# Patient Record
Sex: Male | Born: 1970
Health system: Southern US, Community
[De-identification: ages and names within clinical notes are randomized; demographics above are authoritative.]

## PROBLEM LIST (undated history)

## (undated) DIAGNOSIS — I69359 Hemiplegia and hemiparesis following cerebral infarction affecting unspecified side: Secondary | ICD-10-CM

## (undated) DIAGNOSIS — E538 Deficiency of other specified B group vitamins: Secondary | ICD-10-CM

## (undated) DIAGNOSIS — H532 Diplopia: Secondary | ICD-10-CM

## (undated) DIAGNOSIS — C719 Malignant neoplasm of brain, unspecified: Secondary | ICD-10-CM

## (undated) DIAGNOSIS — R2990 Unspecified symptoms and signs involving the nervous system: Secondary | ICD-10-CM

## (undated) DIAGNOSIS — R29898 Other symptoms and signs involving the musculoskeletal system: Secondary | ICD-10-CM

## (undated) DIAGNOSIS — E039 Hypothyroidism, unspecified: Secondary | ICD-10-CM

## (undated) DIAGNOSIS — R569 Unspecified convulsions: Secondary | ICD-10-CM

## (undated) DIAGNOSIS — H905 Unspecified sensorineural hearing loss: Secondary | ICD-10-CM

## (undated) DIAGNOSIS — F329 Major depressive disorder, single episode, unspecified: Secondary | ICD-10-CM

## (undated) DIAGNOSIS — R7989 Other specified abnormal findings of blood chemistry: Secondary | ICD-10-CM

## (undated) DIAGNOSIS — J309 Allergic rhinitis, unspecified: Secondary | ICD-10-CM

## (undated) DIAGNOSIS — R269 Unspecified abnormalities of gait and mobility: Secondary | ICD-10-CM

## (undated) DIAGNOSIS — R296 Repeated falls: Secondary | ICD-10-CM

## (undated) DIAGNOSIS — I69398 Other sequelae of cerebral infarction: Secondary | ICD-10-CM

## (undated) DIAGNOSIS — F419 Anxiety disorder, unspecified: Secondary | ICD-10-CM

## (undated) DIAGNOSIS — G4700 Insomnia, unspecified: Secondary | ICD-10-CM

## (undated) DIAGNOSIS — F32A Depression, unspecified: Secondary | ICD-10-CM

## (undated) HISTORY — DX: Repeated falls: R29.6

## (undated) HISTORY — PX: BRAIN SURGERY: SHX531

## (undated) HISTORY — DX: Other specified abnormal findings of blood chemistry: R79.89

## (undated) HISTORY — PX: HERNIA REPAIR: SHX51

## (undated) HISTORY — DX: Deficiency of other specified B group vitamins: E53.8

## (undated) HISTORY — PX: SHUNT REVISION: SHX343

## (undated) HISTORY — DX: Unspecified sensorineural hearing loss: H90.5

## (undated) HISTORY — DX: Hypothyroidism, unspecified: E03.9

## (undated) HISTORY — DX: Anxiety disorder, unspecified: F41.9

## (undated) HISTORY — DX: Unspecified symptoms and signs involving the nervous system: R29.90

## (undated) HISTORY — DX: Allergic rhinitis, unspecified: J30.9

## (undated) HISTORY — DX: Unspecified abnormalities of gait and mobility: R26.9

## (undated) HISTORY — DX: Diplopia: H53.2

## (undated) HISTORY — DX: Hemiplegia and hemiparesis following cerebral infarction affecting unspecified side: I69.359

## (undated) HISTORY — DX: Depression, unspecified: F32.A

## (undated) HISTORY — DX: Insomnia, unspecified: G47.00

## (undated) HISTORY — DX: Other sequelae of cerebral infarction: I69.398

## (undated) HISTORY — DX: Malignant neoplasm of brain, unspecified: C71.9

## (undated) HISTORY — DX: Major depressive disorder, single episode, unspecified: F32.9

## (undated) HISTORY — DX: Other symptoms and signs involving the musculoskeletal system: R29.898

## (undated) HISTORY — DX: Unspecified convulsions: R56.9

---

## 2009-02-16 ENCOUNTER — Emergency Department (HOSPITAL_COMMUNITY): Admission: EM | Admit: 2009-02-16 | Discharge: 2009-02-16 | Payer: Self-pay | Admitting: Emergency Medicine

## 2011-01-12 LAB — CBC
HCT: 45.1 % (ref 39.0–52.0)
MCV: 91.2 fL (ref 78.0–100.0)
RBC: 4.95 MIL/uL (ref 4.22–5.81)
WBC: 6.5 10*3/uL (ref 4.0–10.5)

## 2011-01-12 LAB — COMPREHENSIVE METABOLIC PANEL
AST: 22 U/L (ref 0–37)
BUN: 7 mg/dL (ref 6–23)
CO2: 27 mEq/L (ref 19–32)
Chloride: 109 mEq/L (ref 96–112)
Creatinine, Ser: 0.97 mg/dL (ref 0.4–1.5)
GFR calc non Af Amer: >60 mL/min (ref >60)
Glucose, Bld: 96 mg/dL (ref 70–99)
Total Bilirubin: 0.4 mg/dL (ref 0.3–1.2)

## 2011-01-12 LAB — POCT CARDIAC MARKERS: Troponin i, poc: <0.05 ng/mL (ref 0.00–0.09)

## 2011-01-12 LAB — DIFFERENTIAL
Basophils Absolute: 0 10*3/uL (ref 0.0–0.1)
Eosinophils Relative: 1 % (ref 0–5)
Lymphocytes Relative: 17 % (ref 12–46)
Neutro Abs: 4.8 10*3/uL (ref 1.7–7.7)

## 2011-09-12 ENCOUNTER — Emergency Department: Payer: Self-pay | Admitting: Internal Medicine

## 2011-10-11 ENCOUNTER — Ambulatory Visit: Payer: Self-pay | Admitting: Neurology

## 2012-11-13 ENCOUNTER — Inpatient Hospital Stay: Payer: Self-pay | Admitting: Surgery

## 2012-11-13 LAB — COMPREHENSIVE METABOLIC PANEL
Alkaline Phosphatase: 83 U/L (ref 50–136)
Chloride: 101 mmol/L (ref 98–107)
Creatinine: 1.12 mg/dL (ref 0.60–1.30)
EGFR (African American): >60
EGFR (Non-African Amer.): >60
Osmolality: 271 (ref 275–301)
Potassium: 3.3 mmol/L — ABNORMAL LOW (ref 3.5–5.1)
SGPT (ALT): 33 U/L (ref 12–78)
Sodium: 134 mmol/L — ABNORMAL LOW (ref 136–145)
Total Protein: 7.5 g/dL (ref 6.4–8.2)

## 2012-11-13 LAB — URINALYSIS, COMPLETE
Ketone: NEGATIVE
Leukocyte Esterase: NEGATIVE
Ph: 5 (ref 4.5–8.0)
Squamous Epithelial: <1

## 2012-11-13 LAB — CBC
HCT: 49.2 % (ref 40.0–52.0)
MCV: 89 fL (ref 80–100)
Platelet: 194 10*3/uL (ref 150–440)
RBC: 5.52 10*6/uL (ref 4.40–5.90)
WBC: 5.4 10*3/uL (ref 3.8–10.6)

## 2012-11-13 LAB — LIPASE, BLOOD: Lipase: 95 U/L (ref 73–393)

## 2012-11-14 LAB — BASIC METABOLIC PANEL
Anion Gap: 5 — ABNORMAL LOW (ref 7–16)
BUN: 13 mg/dL (ref 7–18)
Calcium, Total: 7.7 mg/dL — ABNORMAL LOW (ref 8.5–10.1)
Chloride: 104 mmol/L (ref 98–107)
Co2: 29 mmol/L (ref 21–32)
Creatinine: 1.06 mg/dL (ref 0.60–1.30)
EGFR (African American): >60
EGFR (Non-African Amer.): >60
Osmolality: 276 (ref 275–301)
Potassium: 3.7 mmol/L (ref 3.5–5.1)

## 2012-11-14 LAB — CBC WITH DIFFERENTIAL/PLATELET
Eosinophil %: 0.6 %
HGB: 14.6 g/dL (ref 13.0–18.0)
Lymphocyte %: 16 %
MCHC: 35.2 g/dL (ref 32.0–36.0)
MCV: 89 fL (ref 80–100)
Monocyte %: 18.4 %
Neutrophil #: 3 10*3/uL (ref 1.4–6.5)
Neutrophil %: 64.8 %
Platelet: 160 10*3/uL (ref 150–440)
RDW: 12.6 % (ref 11.5–14.5)
WBC: 4.7 10*3/uL (ref 3.8–10.6)

## 2012-11-16 LAB — BASIC METABOLIC PANEL
Calcium, Total: 8.1 mg/dL — ABNORMAL LOW (ref 8.5–10.1)
Chloride: 109 mmol/L — ABNORMAL HIGH (ref 98–107)
Co2: 28 mmol/L (ref 21–32)
Creatinine: 0.84 mg/dL (ref 0.60–1.30)
EGFR (Non-African Amer.): >60
Glucose: 87 mg/dL (ref 65–99)
Osmolality: 282 (ref 275–301)
Potassium: 3.2 mmol/L — ABNORMAL LOW (ref 3.5–5.1)
Sodium: 143 mmol/L (ref 136–145)

## 2012-11-16 LAB — CBC WITH DIFFERENTIAL/PLATELET
Basophil %: 0.3 %
Eosinophil #: 0.1 10*3/uL (ref 0.0–0.7)
Eosinophil %: 3 %
HCT: 36.3 % — ABNORMAL LOW (ref 40.0–52.0)
HGB: 12.4 g/dL — ABNORMAL LOW (ref 13.0–18.0)
Lymphocyte #: 1.4 10*3/uL (ref 1.0–3.6)
Lymphocyte %: 30.1 %
MCH: 30.6 pg (ref 26.0–34.0)
MCHC: 34 g/dL (ref 32.0–36.0)
Neutrophil #: 2.6 10*3/uL (ref 1.4–6.5)
Neutrophil %: 55.8 %
Platelet: 167 10*3/uL (ref 150–440)
RBC: 4.04 10*6/uL — ABNORMAL LOW (ref 4.40–5.90)

## 2013-11-05 DIAGNOSIS — M1990 Unspecified osteoarthritis, unspecified site: Secondary | ICD-10-CM | POA: Insufficient documentation

## 2013-11-05 DIAGNOSIS — R413 Other amnesia: Secondary | ICD-10-CM

## 2013-11-05 DIAGNOSIS — R519 Headache, unspecified: Secondary | ICD-10-CM | POA: Insufficient documentation

## 2013-11-05 DIAGNOSIS — G479 Sleep disorder, unspecified: Secondary | ICD-10-CM | POA: Insufficient documentation

## 2013-11-05 HISTORY — DX: Headache, unspecified: R51.9

## 2013-11-05 HISTORY — DX: Unspecified osteoarthritis, unspecified site: M19.90

## 2013-11-05 HISTORY — DX: Other amnesia: R41.3

## 2013-11-05 HISTORY — DX: Sleep disorder, unspecified: G47.9

## 2014-03-12 ENCOUNTER — Ambulatory Visit: Payer: Self-pay | Admitting: Specialist

## 2014-03-12 LAB — CBC WITH DIFFERENTIAL/PLATELET
BASOS ABS: 0 10*3/uL (ref 0.0–0.1)
BASOS PCT: 0.5 %
EOS ABS: 0.2 10*3/uL (ref 0.0–0.7)
Eosinophil %: 2.5 %
HCT: 45.2 % (ref 40.0–52.0)
HGB: 14.9 g/dL (ref 13.0–18.0)
LYMPHS ABS: 1.4 10*3/uL (ref 1.0–3.6)
Lymphocyte %: 19.4 %
MCH: 30.1 pg (ref 26.0–34.0)
MCHC: 33 g/dL (ref 32.0–36.0)
MCV: 91 fL (ref 80–100)
MONO ABS: 0.5 x10 3/mm (ref 0.2–1.0)
Monocyte %: 7.2 %
NEUTROS ABS: 5.2 10*3/uL (ref 1.4–6.5)
Neutrophil %: 70.4 %
PLATELETS: 202 10*3/uL (ref 150–440)
RBC: 4.96 10*6/uL (ref 4.40–5.90)
RDW: 12.9 % (ref 11.5–14.5)
WBC: 7.4 10*3/uL (ref 3.8–10.6)

## 2014-03-12 LAB — BASIC METABOLIC PANEL
ANION GAP: 3 — AB (ref 7–16)
BUN: 14 mg/dL (ref 7–18)
CALCIUM: 9.2 mg/dL (ref 8.5–10.1)
CHLORIDE: 107 mmol/L (ref 98–107)
CREATININE: 1.07 mg/dL (ref 0.60–1.30)
Co2: 29 mmol/L (ref 21–32)
GLUCOSE: 86 mg/dL (ref 65–99)
Osmolality: 277 (ref 275–301)
POTASSIUM: 4.2 mmol/L (ref 3.5–5.1)
Sodium: 139 mmol/L (ref 136–145)

## 2014-03-25 ENCOUNTER — Ambulatory Visit: Payer: Self-pay | Admitting: Specialist

## 2014-07-05 ENCOUNTER — Ambulatory Visit (HOSPITAL_COMMUNITY): Payer: Self-pay | Admitting: Psychiatry

## 2014-11-06 ENCOUNTER — Emergency Department: Payer: Self-pay | Admitting: Emergency Medicine

## 2014-11-06 LAB — COMPREHENSIVE METABOLIC PANEL WITH GFR
Albumin: 3.8 g/dL
Alkaline Phosphatase: 89 U/L
Anion Gap: 7
BUN: 14 mg/dL
Bilirubin,Total: 0.4 mg/dL
Calcium, Total: 8.8 mg/dL
Chloride: 106 mmol/L
Co2: 29 mmol/L
Creatinine: 1.11 mg/dL
EGFR (African American): >60
EGFR (Non-African Amer.): >60
Glucose: 91 mg/dL
Osmolality: 283
Potassium: 3.7 mmol/L
SGOT(AST): 33 U/L
SGPT (ALT): 55 U/L
Sodium: 142 mmol/L
Total Protein: 6.9 g/dL

## 2014-11-06 LAB — CBC
HCT: 44.9 %
HGB: 15.2 g/dL
MCH: 31 pg
MCHC: 33.7 g/dL
MCV: 92 fL
Platelet: 202 x10 3/mm 3
RBC: 4.89 x10 6/mm 3
RDW: 12.8 %
WBC: 7.3 x10 3/mm 3

## 2014-11-06 LAB — SALICYLATE LEVEL: Salicylates, Serum: <1.7 mg/dL

## 2014-11-06 LAB — TSH: THYROID STIMULATING HORM: 3.63 u[IU]/mL

## 2014-11-06 LAB — ACETAMINOPHEN LEVEL: Acetaminophen: <2 ug/mL

## 2014-11-06 LAB — ETHANOL: Ethanol: <3 mg/dL

## 2015-01-24 NOTE — H&P (Signed)
PATIENT NAME:  Love, [name] MR#:  [id number] DATE OF BIRTH:  01-01-1971  DATE OF ADMISSION:  11/13/2012  PRIMARY CARE PHYSICIAN: [facility].   ADMITTING PHYSICIAN: Dr. Pat Patrick.   CHIEF COMPLAINT: Nausea, vomiting, diarrhea and abdominal pain.   BRIEF HISTORY: The patient is a 44 year old gentleman seen in the Emergency Room with a 2-day history of abdominal pain. He noted a gradual onset of some abdominal distention followed by nausea, vomiting and diarrhea. He noted that he was burping regularly, passing gas, but had increasing abdominal distention. Abdominal pain increased and he presented to the Emergency Room for further evaluation. Workup in the Emergency Room revealed some mild changes in his electrolytes but no elevation in his white blood cell count. Plain films demonstrate an ileus pattern. Noncontrasted CT scan was performed which demonstrated a similar pattern. Surgical service was consulted.    He has history of an unknown brain tumor treated in [place] at the [facility] by the neurosurgeons. No primary surgery was performed. The patient had radiation and chemotherapy by his report. A shunt was placed originally from his ventricles to his abdomen, but he had some difficulties with the perineal portion of the shunt and it was converted to a chest shunt. He has not had any problems since that time. He has not had any other previous abdominal surgery. He denies a history of hepatitis, yellow jaundice, pancreatitis, peptic ulcer disease, gallbladder disease or diverticulitis. He has had a colonoscopy 3 years ago. He was last seen by his primary care physician in [facility] 4 months ago for routine followup.  He has new onset of a seizure disorder which is being managed by [facility] neurology group. Has not seen the oncologist or the surgeons in over a year.   CURRENT MEDICATIONS:  Include Keppra 1000 mg twice a day and Neurontin 100 mg 4 times a day.   ALLERGIES: HE  IS ALLERGIC TO SULFA DRUGS AND IVP DYE.   SOCIAL HISTORY:  He does not have a history of tobacco abuse or alcohol abuse. He is currently living with a friend in [place]. He is unemployed currently.   REVIEW OF SYSTEMS: Carried out and is otherwise unremarkable.   PHYSICAL EXAMINATION: GENERAL: He is alert, appears to be engaged and involved with some hearing loss.  VITALS: Blood pressure 104/67, pain scale was rated at 10, heart rate 90 and regular, respirations 20, oxygen saturation stable.  HEENT: Reveals no significant abnormalities. He has no pupillary abnormalities, no facial deformity and no scleral icterus.  NECK: Supple without adenopathy or tenderness. Trachea is midline.  CHEST: Clear with no adventitious sounds.  CARDIAC: No murmurs or gallops to my ear. He seems to be in normal sinus rhythm.  ABDOMEN: His abdomen is mildly distended, generally tympanitic with some diffuse tenderness. He does not have any point tenderness, rebound, guarding, masses or hernias noted. He does have active bowel sounds which did not sound obstructed on examination.  EXTREMITIES: Reveals full range of motion. No deformities. Good distal pulses.  PSYCHIATRIC: Normal orientation, normal affect.   IMPRESSION AND RECOMMENDATIONS: I have independently reviewed the CT scan. He does not appear to be obstructed. We will admit him to the hospital, hydrate him, look for an infectious etiology to his diarrhea, check Clostridium difficile titer and get gastroenterology  involved if necessary. We will ask internal medicine service to help [place] manage his seizure disorder. This plan has been discussed with the patient and his family, and they are  in agreement.    ____________________________ Micheline Maze, MD rle:cs D: 11/13/2012 14:26:59 ET T: 11/13/2012 15:05:58 ET JOB#: [id number]  cc: Rodena Goldmann III, MD, <Dictator> Rodena Goldmann MD ELECTRONICALLY SIGNED 11/15/2012 18:05

## 2015-01-24 NOTE — Discharge Summary (Signed)
PATIENT NAME:  [name] Love, [name] Love MR#:  [id number] DATE OF BIRTH:  [birth year]  DATE OF ADMISSION:  11/13/2012 DATE OF DISCHARGE:  11/16/2012  BRIEF HISTORY: Kye Hedden is a 44 year old gentleman admitted through the Emergency Room with abdominal distention, nausea, vomiting and diarrhea. CT scan demonstrated some distended colon and the question of possible bowel obstruction was raised. Review of the films suggested probable ileus. The patient has had problems with diarrhea in the past. He has multiple other medical comorbidities. He was admitted to the hospital, placed on IV antibiotics, IV rehydration and liquid diet. His symptoms improved over the next 48 hours. He had been on antibiotics for upper respiratory infection and so we ruled out the possibility of Clostridium difficile infection. The titers were negative. His exam is improved, he is tolerating a regular diet and his diarrhea is much improved. We will discharge him home today to be followed in 7 to 10 days' time. He will likely need a gastroenterologist for further followup and possible colonoscopy.   DISCHARGE MEDICATIONS: Include Neurontin 100 mg q.i.d., Keppra 1000 mg p.o. b.i.d., metronidazole 500 mg p.o. q. 8 hours, Percocet 5/325 mg every 4 to 6 hours p.r.n. and ciprofloxacin 500 mg p.o. q. 12 hours.   FINAL DISCHARGE DIAGNOSES: Abdominal pain, diarrhea. ____________________________ Micheline Maze, MD rle:sb D: 11/16/2012 10:40:31 ET T: 11/16/2012 10:54:29 ET JOB#: [id number]  cc: Rodena Goldmann III, MD, <Dictator> Rodena Goldmann MD ELECTRONICALLY SIGNED 11/24/2012 17:17

## 2015-01-24 NOTE — Consult Note (Signed)
PATIENT NAME:  [name] Love, [name] Love MR#:  [id number] DATE OF BIRTH:  March 13, 1971  DATE OF CONSULTATION:  11/13/2012  REFERRING PHYSICIAN:  Dr. Pat Patrick.  PRIMARY CARE PHYSICIAN:  In [place].  CONSULTING PHYSICIAN:  Epifanio Lesches, MD  REASON FOR CONSULT:  For seizures and epilepsy management.  HISTORY OF PRESENT ILLNESS: A 44 year old male admitted to surgical service this morning for abdominal pain, diarrhea and ileus. Medical consult requested for management of seizures. The patient says that he has had seizure disorder for a long time. Has history of brain cancer with programable VP shunt.  Sees a neurologist in [place].  Last seizure was about 2 months ago, and his Keppra dose has been increased. Right now, he is on Keppra 1 gram p.o. b.i.d., increased the dose 2 months ago, and the patient has been tolerating this dose pretty well. Denies any side effects of that, and he is also on Neurontin for neuropathic pain. The patient has some balance issues because of his multiple brain surgeries, but otherwise denies any headaches or seizures recently. The patient has been having some abdominal pain, diarrhea since the weekend associated with vomiting, and says that foul-smelling diarrhea that is ongoing he is unable to control.   PAST MEDICAL HISTORY: Seizure disorder, history of brain cancer status post programable VP shunt.  Also has neuropathic pain.  ALLERGIES: ALLERGIC TO IV DYE, INFLUENZA VACCINE H1N1 AND SULFA DRUGS.   SOCIAL HISTORY: No smoking. No drinking. No drugs. The patient lives with mother.   MEDICATIONS:  Right now, he is on IV fluids D5 half with potassium at 150 an hour, Cipro 400 q.12, Lovenox 40 subQ daily.  He is on Percocet 5/325 q.4 hours p.r.n., Neurontin 100 mg p.o. q.i.d., Dilaudid 1 mg q.2 hours p.r.n., Keppra 1 gram q.12 hours. He is on metronidazole 500 mg q.8 hours and Zofran.   HOME MEDICATIONS:  Include Keppra 1 gram q.12 hours and also Neurontin 100 mg q.i.d.    PAST  SURGICAL HISTORY: Had multiple brain surgeries and history of brain cancer status post radiation therapy.   REVIEW OF SYSTEMS: CONSTITUTIONAL:  Has no headache. No blurred vision.  GASTROINTESTINAL:  Has nausea, abdominal pain, diarrhea since Saturday.  NEUROLOGIC:  Denies any history of TIAs.  GENITOURINARY: The patient has no dysuria.  CARDIOVASCULAR:  No chest pain.  RESPIRATORY:  No trouble breathing.   PHYSICAL EXAMINATION: VITAL SIGNS: Temperature 98.2, heart rate 107, blood pressure 104/69, sats 94% on room air.  GENERAL: Alert, awake, oriented, a little hard of hearing but answering questions approximately.  HEAD AND EYES:  Atraumatic, normocephalic. Pupils equally reacting to light. Extraocular movements intact.  EAR, NOSE, THROAT: No tympanic membrane congestion. No turbinate hypertrophy. No oropharyngeal erythema.   NECK:   Normal range of motion. No JVD. No carotid bruit. CARDIOVASCULAR: S1 and S2 regular. No murmurs.  LUNGS: Clear to auscultation. No wheeze noted.  ABDOMEN: Mildly distended and tympanic, and has no point tenderness. No guarding, no hernias, and has active bowel sounds.  EXTREMITIES: No edema. Good range of motion. Good distal pulses.  NEUROLOGICAL: Oriented to time, place, person.  PSYCHIATRIC:  Mood and affect are within normal limits.   LABORATORY DATA:  UA is clear, yellow-colored urine. CAT scan of the abdomen shows multiple dilated loops of small bowel and colon consistent with adynamic ileus. WBC 5.4, hemoglobin 17.4, hematocrit 49.2, platelets 194. Electrolytes: Sodium 134, potassium 3.3, chloride 101, bicarb 24, creatinine 1.12, glucose 124.  ASESSMENT AND PLAN:   1.  This patient is a 44 year old male patient with history of seizure disorder with with h/o brain surgery and VP shunt,) on Keppra 1 gram p.o. b.i.d. at home, and right now he is on Keppra 1 gram p.o. b.i.d., and continue the same dose.,can change it to IV in case he has nasuea,diarrhea which  are  persistent, The medicine dose has been increased by the primary doctor, his neurologist, 2 months ago, so we would recommend to continue the Keppra 1 gram p.o. b.i.d. For his neuropathy pain, the patient is already on Neurontin 100 mg p.o. q.i.d. Continue that.  2.  Abdominal pain with colitis. He is already on Cipro, Flagyl, along with fluids. Continue that. Stool for C. diff has been ordered. He is on contact isolation. Continue to monitor as per the surgery, and we will follow up with this patient.   TIME SPENT: About 55 minutes.   Thank you for asking [place] to see this patient.    ____________________________ Epifanio Lesches, MD sk:dm D: 11/13/2012 20:04:22 ET T: 11/13/2012 22:10:10 ET JOB#: [id number]  cc: Epifanio Lesches, MD, <Dictator> Epifanio Lesches MD ELECTRONICALLY SIGNED 12/05/2012 13:41

## 2015-01-25 NOTE — Op Note (Signed)
PATIENT NAME:  [name] Love, [name] Love MR#:  [id number] DATE OF BIRTH:  [birth year]  DATE OF PROCEDURE:  03/25/2014  PREOPERATIVE DIAGNOSIS: Partial underside tear of the left medial meniscus.   POSTOPERATIVE DIAGNOSIS: Partial underside tear of the left medial meniscus.   OPERATION: Arthroscopic left medial meniscus repair.   SURGEON: Dr. Earnestine Leys  ANESTHESIA: General LMA.   COMPLICATIONS: None.   DRAINS: None.   ESTIMATED BLOOD LOSS: Minimal. Replaced:  None.   OPERATIVE FINDINGS: The patient had a partial underside tear of the posterior horn of the medial meniscus. Articular surfaces were intact. There was significant anterior synovitis. The intercondylar notch was normal, with normal anterior cruciate and posterior cruciate. The lateral compartment was normal. There were no loose bodies. The patellofemoral joint was smooth. There was no significant suprapatellar synovitis.   OPERATIVE PROCEDURE: The patient was brought to the Operating Room, where he underwent satisfactory general LMA anesthesia in the supine position. The left leg was prepped and draped in sterile fashion, and arthroscopy carried out through standard portals. The above findings were encountered. Some anterior synovectomy was carried out for visualization. Probe was inserted, and the meniscus initially looked good posteriorly and medially. However, with probing, the undersurface showed a partial tear near the meniscocapsular junction. The remainder of the joint was normal. At that point, I introduced a Fast-Fix meniscal repair and placed a meniscal stitch in the posterior horn of the meniscus to stabilize this. This was done after the motorized resector was used to roughen up the tissue and get some bleeding. This stabilized the meniscus well. The wound was then irrigated and closed with 3-0 nylon sutures. Dry sterile dressings were applied. The tourniquet was not used. The patient was awakened and taken to recovery in good  condition.    ____________________________ Park Breed, MD hem:mr D: 03/25/2014 14:35:56 ET T: 03/25/2014 19:13:15 ET JOB#: [id number]  cc: Park Breed, MD, <Dictator> Park Breed MD ELECTRONICALLY SIGNED 03/25/2014 20:17

## 2015-04-22 ENCOUNTER — Emergency Department: Payer: Medicare Other

## 2015-04-22 ENCOUNTER — Emergency Department
Admission: EM | Admit: 2015-04-22 | Discharge: 2015-04-22 | Disposition: A | Discharge status: Home / Self Care | Payer: Medicare Other | Attending: Emergency Medicine | Admitting: Emergency Medicine

## 2015-04-22 ENCOUNTER — Encounter: Payer: Self-pay | Admitting: Emergency Medicine

## 2015-04-22 DIAGNOSIS — Y9289 Other specified places as the place of occurrence of the external cause: Secondary | ICD-10-CM | POA: Insufficient documentation

## 2015-04-22 DIAGNOSIS — R51 Headache: Secondary | ICD-10-CM

## 2015-04-22 DIAGNOSIS — G40909 Epilepsy, unspecified, not intractable, without status epilepticus: Secondary | ICD-10-CM | POA: Diagnosis not present

## 2015-04-22 DIAGNOSIS — Y998 Other external cause status: Secondary | ICD-10-CM | POA: Insufficient documentation

## 2015-04-22 DIAGNOSIS — Y9389 Activity, other specified: Secondary | ICD-10-CM | POA: Diagnosis not present

## 2015-04-22 DIAGNOSIS — W1839XA Other fall on same level, initial encounter: Secondary | ICD-10-CM | POA: Insufficient documentation

## 2015-04-22 DIAGNOSIS — R519 Headache, unspecified: Secondary | ICD-10-CM

## 2015-04-22 DIAGNOSIS — S0990XA Unspecified injury of head, initial encounter: Secondary | ICD-10-CM | POA: Diagnosis present

## 2015-04-22 DIAGNOSIS — R569 Unspecified convulsions: Secondary | ICD-10-CM

## 2015-04-22 HISTORY — DX: Malignant neoplasm of brain, unspecified: C71.9

## 2015-04-22 HISTORY — DX: Unspecified convulsions: R56.9

## 2015-04-22 LAB — CBC
HCT: 45.7 % (ref 40.0–52.0)
Hemoglobin: 15.5 g/dL (ref 13.0–18.0)
MCH: 31.8 pg (ref 26.0–34.0)
MCHC: 34 g/dL (ref 32.0–36.0)
MCV: 93.6 fL (ref 80.0–100.0)
PLATELETS: 237 10*3/uL (ref 150–440)
RBC: 4.88 MIL/uL (ref 4.40–5.90)
RDW: 12.2 % (ref 11.5–14.5)
WBC: 9.3 10*3/uL (ref 3.8–10.6)

## 2015-04-22 LAB — COMPREHENSIVE METABOLIC PANEL
ALBUMIN: 4.3 g/dL (ref 3.5–5.0)
ALT: 23 U/L (ref 17–63)
AST: 22 U/L (ref 15–41)
Alkaline Phosphatase: 86 U/L (ref 38–126)
Anion gap: 5 (ref 5–15)
BILIRUBIN TOTAL: 0.5 mg/dL (ref 0.3–1.2)
BUN: 8 mg/dL (ref 6–20)
CO2: 24 mmol/L (ref 22–32)
CREATININE: 1.24 mg/dL (ref 0.61–1.24)
Calcium: 8.9 mg/dL (ref 8.9–10.3)
Chloride: 111 mmol/L (ref 101–111)
GFR calc Af Amer: >60 mL/min (ref >60)
Glucose, Bld: 99 mg/dL (ref 65–99)
Potassium: 3.9 mmol/L (ref 3.5–5.1)
SODIUM: 140 mmol/L (ref 135–145)
Total Protein: 6.9 g/dL (ref 6.5–8.1)

## 2015-04-22 NOTE — ED Notes (Signed)
Pt reports having a seizure on Saturday at [facility], unsure if hit head. Pt has brain cancer and a brain shunt, called neurosurgeon today due to continued headache, recommended he came in for CT scan.

## 2015-04-22 NOTE — Discharge Instructions (Signed)
Epilepsy °Epilepsy is a disorder in which a person has repeated seizures over time. A seizure is a release of abnormal electrical activity in the brain. Seizures can cause a change in attention, behavior, or the ability to remain awake and alert (altered mental status). Seizures often involve uncontrollable shaking (convulsions).  °Most people with epilepsy lead normal lives. However, people with epilepsy are at an increased risk of falls, accidents, and injuries. Therefore, it is important to begin treatment right away. °CAUSES  °Epilepsy has many possible causes. Anything that disturbs the normal pattern of brain cell activity can lead to seizures. This may include:  °· Head injury. °· Birth trauma. °· High fever as a child. °· Stroke. °· Bleeding into or around the brain. °· Certain drugs. °· Prolonged low oxygen, such as what occurs after CPR efforts. °· Abnormal brain development. °· Certain illnesses, such as meningitis, encephalitis (brain infection), malaria, and other infections. °· An imbalance of nerve signaling chemicals (neurotransmitters).   °SIGNS AND SYMPTOMS  °The symptoms of a seizure can vary greatly from one person to another. Right before a seizure, you may have a warning (aura) that a seizure is about to occur. An aura may include the following symptoms: °· Fear or anxiety. °· Nausea. °· Feeling like the room is spinning (vertigo). °· Vision changes, such as seeing flashing lights or spots. °Common symptoms during a seizure include: °· Abnormal sensations, such as an abnormal smell or a bitter taste in the mouth.   °· Sudden, general body stiffness.   °· Convulsions that involve rhythmic jerking of the face, arm, or leg on one or both sides.   °· Sudden change in consciousness.   °¨ Appearing to be awake but not responding.   °¨ Appearing to be asleep but cannot be awakened.   °· Grimacing, chewing, lip smacking, drooling, tongue biting, or loss of bowel or bladder control. °After a seizure,  you may feel sleepy for a while.  °DIAGNOSIS  °Your health care provider will ask about your symptoms and take a medical history. Descriptions from any witnesses to your seizures will be very helpful in the diagnosis. A physical exam, including a detailed neurological exam, is necessary. Various tests may be done, such as:  °· An electroencephalogram (EEG). This is a painless test of your brain waves. In this test, a diagram is created of your brain waves. These diagrams can be interpreted by a specialist. °· An MRI of the brain.   °· A CT scan of the brain.   °· A spinal tap (lumbar puncture, LP). °· Blood tests to check for signs of infection or abnormal blood chemistry. °TREATMENT  °There is no cure for epilepsy, but it is generally treatable. Once epilepsy is diagnosed, it is important to begin treatment as soon as possible. For most people with epilepsy, seizures can be controlled with medicines. The following may also be used: °· A pacemaker for the brain (vagus nerve stimulator) can be used for people with seizures that are not well controlled by medicine. °· Surgery on the brain. °For some people, epilepsy eventually goes away. °HOME CARE INSTRUCTIONS  °· Follow your health care provider's recommendations on driving and safety in normal activities. °· Get enough rest. Lack of sleep can cause seizures. °· Only take over-the-counter or prescription medicines as directed by your health care provider. Take any prescribed medicine exactly as directed. °· Avoid any known triggers of your seizures. °· Keep a seizure diary. Record what you recall about any seizure, especially any possible trigger.   °· Make   sure the people you live and work with know that you are prone to seizures. They should receive instructions on how to help you. In general, a witness to a seizure should:   Cushion your head and body.   Turn you on your side.   Avoid unnecessarily restraining you.   Not place anything inside your  mouth.   Call for emergency medical help if there is any question about what has occurred.   Follow up with your health care provider as directed. You may need regular blood tests to monitor the levels of your medicine.  SEEK MEDICAL CARE IF:   You develop signs of infection or other illness. This might increase the risk of a seizure.   You seem to be having more frequent seizures.   Your seizure pattern is changing.  SEEK IMMEDIATE MEDICAL CARE IF:   You have a seizure that does not stop after a few moments.   You have a seizure that causes any difficulty in breathing.   You have a seizure that results in a very severe headache.   You have a seizure that leaves you with the inability to speak or use a part of your body.  Document Released: 09/20/2005 Document Revised: 07/11/2013 Document Reviewed: 05/02/2013 [facility] Patient Information 2015 [place], [place]. This information is not intended to replace advice given to you by your health care provider. Make sure you discuss any questions you have with your health care provider.     As we have discussed today's results are largely within normal limits including your lab workup, and CT. Head. Please follow-up with your neurologist and neurosurgeon regarding your recent seizure and fall. Return to the emergency department for any further seizure-like activity, head injuries, or any other symptom personally concerning to yourself.

## 2015-04-22 NOTE — ED Provider Notes (Signed)
[facility] Emergency Department Provider Note  Time seen: 5:47 PM  I have reviewed the triage vital signs and the nursing notes.   HISTORY  Chief Complaint Headache    HPI [name] Love is a 44 y.o. male with a past medical history of brain cancer, status post radiation, seizure disorder on topiramate, who presents the emergency department after a possible seizure and fall on Saturday. According to the patient he was at [employer], when he blacked out. He states he will occasionally have these "spells." His neurologist believes them to be seizures, although he states they have never captured any activity on EEG. Patient has a VP shunt. He states he felt well Sunday, had a slight headache yesterday, so he called his neurologist and neurosurgeon today. They recommended that the patient come to a local emergency department for a head CT to rule out any bleed and to make sure that the shunt was still in place. Patient denies any symptoms currently. Denies any headache. States he feels fairly well/normal.     Past Medical History  Diagnosis Date  . Seizures   . Brain cancer     There are no active problems to display for this patient.   Past Surgical History  Procedure Laterality Date  . Brain surgery    . Shunt revision    . Hernia repair      No current outpatient prescriptions on file.  Allergies Sulfa antibiotics and Contrast media  No family history on file.  Social History History  Substance Use Topics  . Smoking status: Never Smoker   . Smokeless tobacco: Not on file  . Alcohol Use: No    Review of Systems Constitutional: Negative for fever. Cardiovascular: Negative for chest pain. Respiratory: Negative for shortness of breath. Gastrointestinal: Negative for abdominal pain Neurological: Positive for headache. Now resolved. Negative for focal weakness or numbness. 10-point ROS otherwise  negative.  ____________________________________________   PHYSICAL EXAM:  VITAL SIGNS: ED Triage Vitals  Enc Vitals Group     BP 04/22/15 1702 115/93 mmHg     Pulse Rate 04/22/15 1702 101     Resp 04/22/15 1702 16     Temp 04/22/15 1702 98.2 F (36.8 C)     Temp Source 04/22/15 1702 Oral     SpO2 04/22/15 1702 96 %     Weight 04/22/15 1702 190 lb (86.183 kg)     Height 04/22/15 1702 5\' 6"  (1.676 m)     Head Cir --      Peak Flow --      Pain Score 04/22/15 1703 5     Pain Loc --      Pain Edu? --      Excl. in [place]? --     Constitutional: Alert and oriented. Well appearing and in no distress. Eyes: Normal exam, 3 mm PERRL bilaterally ENT   Head: Normocephalic and atraumatic Cardiovascular: Normal rate, regular rhythm. No murmurs Respiratory: Normal respiratory effort without tachypnea nor retractions. Breath sounds are clear  Gastrointestinal: Soft and nontender. No distention.   Musculoskeletal: Nontender with normal range of motion in all extremities. No lower extremity tenderness or edema. Neurologic:  Normal speech and language. No gross focal neurologic deficits are appreciated. Speech is normal. No pronator drift. Equal grip strength bilaterally. Skin:  Skin is warm, dry and intact.  Psychiatric: Mood and affect are normal. Speech and behavior are normal.   ____________________________________________     RADIOLOGY  CT of the head  shows no acute abnormality, stable changes since February 2016.  ____________________________________________  INITIAL IMPRESSION / ASSESSMENT AND PLAN / ED COURSE  Pertinent labs & imaging results that were available during my care of the patient were reviewed by me and considered in my medical decision making (see chart for details).  Labs are largely within normal limits. CT head is unchanged. Patient denies any symptoms currently. I discussed with the patient normal results, that he should follow-up with his neurologist and  neurosurgeon. Also discussed strict return precautions for any further seizure-like activity, headache, or any other symptoms he deems concerning. Patient agreeable plan.  ____________________________________________   FINAL CLINICAL IMPRESSION(S) / ED DIAGNOSES  Headache Seizure   Harvest Dark, MD 04/22/15 1750

## 2015-04-22 NOTE — ED Notes (Signed)
Spoke with Dr Archie Balboa about pt's condition and pt's hx; verbal orders for CT scan and lab work given.

## 2015-11-10 DIAGNOSIS — F482 Pseudobulbar affect: Secondary | ICD-10-CM | POA: Diagnosis not present

## 2015-11-10 DIAGNOSIS — F0634 Mood disorder due to known physiological condition with mixed features: Secondary | ICD-10-CM | POA: Diagnosis not present

## 2015-11-11 DIAGNOSIS — Z6829 Body mass index (BMI) 29.0-29.9, adult: Secondary | ICD-10-CM | POA: Diagnosis not present

## 2015-11-11 DIAGNOSIS — E669 Obesity, unspecified: Secondary | ICD-10-CM | POA: Diagnosis not present

## 2015-11-11 DIAGNOSIS — J069 Acute upper respiratory infection, unspecified: Secondary | ICD-10-CM | POA: Diagnosis not present

## 2016-02-18 DIAGNOSIS — F0634 Mood disorder due to known physiological condition with mixed features: Secondary | ICD-10-CM | POA: Diagnosis not present

## 2016-02-18 DIAGNOSIS — F482 Pseudobulbar affect: Secondary | ICD-10-CM | POA: Diagnosis not present

## 2016-04-02 DIAGNOSIS — R569 Unspecified convulsions: Secondary | ICD-10-CM | POA: Diagnosis not present

## 2016-04-02 DIAGNOSIS — T50905S Adverse effect of unspecified drugs, medicaments and biological substances, sequela: Secondary | ICD-10-CM | POA: Diagnosis not present

## 2016-06-16 DIAGNOSIS — F0634 Mood disorder due to known physiological condition with mixed features: Secondary | ICD-10-CM | POA: Diagnosis not present

## 2016-06-16 DIAGNOSIS — F064 Anxiety disorder due to known physiological condition: Secondary | ICD-10-CM | POA: Diagnosis not present

## 2016-06-16 DIAGNOSIS — F482 Pseudobulbar affect: Secondary | ICD-10-CM | POA: Diagnosis not present

## 2016-06-17 DIAGNOSIS — F0634 Mood disorder due to known physiological condition with mixed features: Secondary | ICD-10-CM | POA: Diagnosis not present

## 2016-07-15 DIAGNOSIS — H903 Sensorineural hearing loss, bilateral: Secondary | ICD-10-CM | POA: Diagnosis not present

## 2016-07-15 DIAGNOSIS — C717 Malignant neoplasm of brain stem: Secondary | ICD-10-CM | POA: Diagnosis not present

## 2016-07-16 ENCOUNTER — Other Ambulatory Visit: Payer: Self-pay | Admitting: Otolaryngology

## 2016-07-16 DIAGNOSIS — C717 Malignant neoplasm of brain stem: Secondary | ICD-10-CM

## 2016-07-16 DIAGNOSIS — H903 Sensorineural hearing loss, bilateral: Secondary | ICD-10-CM

## 2016-07-20 DIAGNOSIS — Z1389 Encounter for screening for other disorder: Secondary | ICD-10-CM | POA: Diagnosis not present

## 2016-07-20 DIAGNOSIS — Z887 Allergy status to serum and vaccine status: Secondary | ICD-10-CM | POA: Diagnosis not present

## 2016-07-20 DIAGNOSIS — H905 Unspecified sensorineural hearing loss: Secondary | ICD-10-CM | POA: Diagnosis not present

## 2016-07-20 DIAGNOSIS — Z23 Encounter for immunization: Secondary | ICD-10-CM | POA: Diagnosis not present

## 2016-07-20 DIAGNOSIS — Z6831 Body mass index (BMI) 31.0-31.9, adult: Secondary | ICD-10-CM | POA: Diagnosis not present

## 2016-07-20 DIAGNOSIS — E669 Obesity, unspecified: Secondary | ICD-10-CM | POA: Diagnosis not present

## 2016-07-20 DIAGNOSIS — Z01818 Encounter for other preprocedural examination: Secondary | ICD-10-CM | POA: Diagnosis not present

## 2016-07-23 ENCOUNTER — Ambulatory Visit
Admission: RE | Admit: 2016-07-23 | Discharge: 2016-07-23 | Disposition: A | Discharge status: Home / Self Care | Payer: Medicare Other | Source: Ambulatory Visit | Attending: Otolaryngology | Admitting: Otolaryngology

## 2016-07-23 ENCOUNTER — Other Ambulatory Visit: Payer: Self-pay | Admitting: Otolaryngology

## 2016-07-23 DIAGNOSIS — R918 Other nonspecific abnormal finding of lung field: Secondary | ICD-10-CM | POA: Diagnosis not present

## 2016-07-23 DIAGNOSIS — H903 Sensorineural hearing loss, bilateral: Secondary | ICD-10-CM | POA: Diagnosis not present

## 2016-07-23 DIAGNOSIS — Z01811 Encounter for preprocedural respiratory examination: Secondary | ICD-10-CM

## 2016-07-23 DIAGNOSIS — C717 Malignant neoplasm of brain stem: Secondary | ICD-10-CM

## 2016-07-29 DIAGNOSIS — Z0000 Encounter for general adult medical examination without abnormal findings: Secondary | ICD-10-CM | POA: Diagnosis not present

## 2016-07-29 DIAGNOSIS — Z6831 Body mass index (BMI) 31.0-31.9, adult: Secondary | ICD-10-CM | POA: Diagnosis not present

## 2016-07-29 DIAGNOSIS — E039 Hypothyroidism, unspecified: Secondary | ICD-10-CM | POA: Diagnosis not present

## 2016-08-09 DIAGNOSIS — H903 Sensorineural hearing loss, bilateral: Secondary | ICD-10-CM | POA: Diagnosis not present

## 2016-08-09 DIAGNOSIS — C717 Malignant neoplasm of brain stem: Secondary | ICD-10-CM | POA: Diagnosis not present

## 2016-08-18 ENCOUNTER — Ambulatory Visit (HOSPITAL_BASED_OUTPATIENT_CLINIC_OR_DEPARTMENT_OTHER): Admit: 2016-08-18 | Payer: Medicare Other | Admitting: Otolaryngology

## 2016-08-18 ENCOUNTER — Encounter (HOSPITAL_BASED_OUTPATIENT_CLINIC_OR_DEPARTMENT_OTHER): Payer: Self-pay

## 2016-08-18 SURGERY — INSERTION, IMPLANT, COCHLEAR
Anesthesia: General | Laterality: Left

## 2016-09-06 DIAGNOSIS — F0634 Mood disorder due to known physiological condition with mixed features: Secondary | ICD-10-CM | POA: Diagnosis not present

## 2016-09-06 DIAGNOSIS — F482 Pseudobulbar affect: Secondary | ICD-10-CM | POA: Diagnosis not present

## 2016-09-06 DIAGNOSIS — F064 Anxiety disorder due to known physiological condition: Secondary | ICD-10-CM | POA: Diagnosis not present

## 2016-10-05 DIAGNOSIS — R1901 Right upper quadrant abdominal swelling, mass and lump: Secondary | ICD-10-CM | POA: Diagnosis not present

## 2016-10-05 DIAGNOSIS — R10811 Right upper quadrant abdominal tenderness: Secondary | ICD-10-CM | POA: Diagnosis not present

## 2016-10-05 DIAGNOSIS — Z6832 Body mass index (BMI) 32.0-32.9, adult: Secondary | ICD-10-CM | POA: Diagnosis not present

## 2016-10-05 DIAGNOSIS — J309 Allergic rhinitis, unspecified: Secondary | ICD-10-CM | POA: Diagnosis not present

## 2016-10-11 DIAGNOSIS — K8020 Calculus of gallbladder without cholecystitis without obstruction: Secondary | ICD-10-CM | POA: Diagnosis not present

## 2016-10-11 DIAGNOSIS — R1901 Right upper quadrant abdominal swelling, mass and lump: Secondary | ICD-10-CM | POA: Diagnosis not present

## 2016-10-19 DIAGNOSIS — J9811 Atelectasis: Secondary | ICD-10-CM | POA: Diagnosis not present

## 2016-10-19 DIAGNOSIS — K8020 Calculus of gallbladder without cholecystitis without obstruction: Secondary | ICD-10-CM | POA: Diagnosis not present

## 2016-10-19 DIAGNOSIS — R1901 Right upper quadrant abdominal swelling, mass and lump: Secondary | ICD-10-CM | POA: Diagnosis not present

## 2016-10-19 DIAGNOSIS — K439 Ventral hernia without obstruction or gangrene: Secondary | ICD-10-CM | POA: Diagnosis not present

## 2016-11-08 DIAGNOSIS — M542 Cervicalgia: Secondary | ICD-10-CM | POA: Diagnosis not present

## 2016-11-08 DIAGNOSIS — S0990XA Unspecified injury of head, initial encounter: Secondary | ICD-10-CM | POA: Diagnosis not present

## 2016-11-08 DIAGNOSIS — Z23 Encounter for immunization: Secondary | ICD-10-CM | POA: Diagnosis not present

## 2016-11-08 DIAGNOSIS — S199XXA Unspecified injury of neck, initial encounter: Secondary | ICD-10-CM | POA: Diagnosis not present

## 2016-11-08 DIAGNOSIS — M549 Dorsalgia, unspecified: Secondary | ICD-10-CM | POA: Diagnosis not present

## 2016-11-08 DIAGNOSIS — Z79899 Other long term (current) drug therapy: Secondary | ICD-10-CM | POA: Diagnosis not present

## 2016-11-08 DIAGNOSIS — R55 Syncope and collapse: Secondary | ICD-10-CM | POA: Diagnosis not present

## 2016-11-08 DIAGNOSIS — S0101XA Laceration without foreign body of scalp, initial encounter: Secondary | ICD-10-CM | POA: Diagnosis not present

## 2016-11-08 DIAGNOSIS — M545 Low back pain: Secondary | ICD-10-CM | POA: Diagnosis not present

## 2016-11-08 DIAGNOSIS — R569 Unspecified convulsions: Secondary | ICD-10-CM | POA: Diagnosis not present

## 2016-12-06 DIAGNOSIS — F0634 Mood disorder due to known physiological condition with mixed features: Secondary | ICD-10-CM | POA: Diagnosis not present

## 2016-12-06 DIAGNOSIS — F064 Anxiety disorder due to known physiological condition: Secondary | ICD-10-CM | POA: Diagnosis not present

## 2016-12-06 DIAGNOSIS — F482 Pseudobulbar affect: Secondary | ICD-10-CM | POA: Diagnosis not present

## 2017-01-19 DIAGNOSIS — F064 Anxiety disorder due to known physiological condition: Secondary | ICD-10-CM | POA: Diagnosis not present

## 2017-01-19 DIAGNOSIS — F482 Pseudobulbar affect: Secondary | ICD-10-CM | POA: Diagnosis not present

## 2017-01-19 DIAGNOSIS — F0634 Mood disorder due to known physiological condition with mixed features: Secondary | ICD-10-CM | POA: Diagnosis not present

## 2017-01-20 ENCOUNTER — Encounter: Payer: Self-pay | Admitting: Neurology

## 2017-01-20 ENCOUNTER — Encounter (INDEPENDENT_AMBULATORY_CARE_PROVIDER_SITE_OTHER): Payer: Self-pay

## 2017-01-20 ENCOUNTER — Telehealth: Payer: Self-pay | Admitting: Neurology

## 2017-01-20 ENCOUNTER — Ambulatory Visit (INDEPENDENT_AMBULATORY_CARE_PROVIDER_SITE_OTHER): Payer: Medicare Other | Admitting: Neurology

## 2017-01-20 DIAGNOSIS — R569 Unspecified convulsions: Secondary | ICD-10-CM

## 2017-01-20 MED ORDER — TOPIRAMATE 50 MG PO TABS: ORAL_TABLET | ORAL | 5 refills | Status: DC

## 2017-01-20 NOTE — Telephone Encounter (Signed)
Pt's aunt-Betty Sarvis (existing pt of Dr Viona Gilmore) called, says she could not come to appt with the pt. She would like a call back to go over his appt today.

## 2017-01-20 NOTE — Telephone Encounter (Signed)
Please refer to prior telephone note from today. 

## 2017-01-20 NOTE — Progress Notes (Signed)
Reason for visit: Seizures  Referring physician: Dr. Jerrye Bushy Shatto is a 46 y.o. male  History of present illness:  Mr. 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 The patient indicates that he presented with severe headaches and nausea and vomiting. The patient has had recurrence of the tumor following resection, and he eventually required radiation therapy in the late 1990s. The patient has had a VP shunt placement. He has developed some problems with memory loss secondary to the tumor resection and radiation therapy. He has developed seizures, he has been on Keppra and Topamax recently, in the past he has also been on Depakote. The patient denies any significant headaches at this time. He does report some numbness in both feet and in the hands. He feels as if his legs are weak and he has some gait instability. He does have a cane that he uses at times. He has developed hearing loss, he has a hearing aid in the left ear. He lives currently with his aunt, [name]. 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 He claims that the seizures are associated with staring episodes, in the medical records he may also have episodes of leg numbness with loss of consciousness and generalized jerking. The patient claims that the last blackout event was about 2 months ago. He has had several such events over the last one year. The patient notes some problems with urinary urgency at times. He is followed by Dr. Romero Liner from neurosurgery in [place], [place]. He was followed by Dr. Metta Clines who has moved, and he seeks a new neurologist at this time.  Past Medical History:  Diagnosis Date  . Brain cancer ([place])   . Seizures ([place])     Past Surgical History:  Procedure Laterality Date  . BRAIN SURGERY    . HERNIA REPAIR    . SHUNT REVISION      Family History  Problem Relation Age of Onset  . Brain cancer Mother   . High  blood pressure Mother     Social history:  reports that he has never smoked. He has never used smokeless tobacco. He reports that he does not drink alcohol or use drugs.  Medications:  Prior to Admission medications   Medication Sig Start Date End Date Taking? Authorizing Provider  ALPRAZolam (XANAX) 0.25 MG tablet Take 0.25 mg by mouth daily. 1 tablet in the morning and 2 tablets at bedtime 01/13/17  Yes Historical Provider, MD  escitalopram (LEXAPRO) 10 MG tablet Take 10 mg by mouth daily. 12/31/16  Yes Historical Provider, MD  levETIRAcetam (KEPPRA) 1000 MG tablet Take 1.5 tablets by mouth 2 (two) times daily. 12/31/16  Yes Historical Provider, MD  levothyroxine (SYNTHROID, LEVOTHROID) 50 MCG tablet Take 1 tablet by mouth daily. 12/25/16  Yes Historical Provider, MD  NUEDEXTA 20-10 MG CAPS Take 1 capsule by mouth 2 (two) times daily. 12/31/16  Yes Historical Provider, MD  risperiDONE (RISPERDAL) 0.5 MG tablet Take 0.5 mg by mouth at bedtime. 01/19/17  Yes Historical Provider, MD  topiramate (TOPAMAX) 50 MG tablet Take 50 mg by mouth 2 (two) times daily. 12/14/16  Yes Historical Provider, MD      Allergies  Allergen Reactions  . Sulfa Antibiotics   . Contrast Media [Iodinated Diagnostic Agents] Rash    ROS:  Out of a complete 14 system review of symptoms, the patient complains only of the following symptoms, and  all other reviewed systems are negative.  Hearing loss, ringing in the ears Increased thirst Joint pain, achy muscles Allergies Memory loss, numbness, weakness, seizures, passing out Depression, anxiety, decreased energy  Blood pressure 124/81, pulse 85, height 5\' 6"  (1.676 m), weight 200 lb (90.7 kg).  Physical Exam  General: The patient is alert and cooperative at the time of the examination.  Eyes: Pupils are equal, round, and reactive to light. Discs are flat bilaterally.  Neck: The neck is supple, no carotid bruits are noted.  Respiratory: The respiratory  examination is clear.  Cardiovascular: The cardiovascular examination reveals a regular rate and rhythm, no obvious murmurs or rubs are noted.  Skin: Extremities are without significant edema.  Neurologic Exam  Mental status: The patient is alert and oriented x 3 at the time of the examination. The patient has apparent normal recent and remote memory, with an apparently normal attention span and concentration ability.  Cranial nerves: Facial symmetry is present. There is good sensation of the face to pinprick and soft touch bilaterally. The strength of the facial muscles and the muscles to head turning and shoulder shrug are normal bilaterally. Speech is well enunciated, no aphasia or dysarthria is noted. Extraocular movements are full. Visual fields are full. The tongue is midline, and the patient has symmetric elevation of the soft palate. No obvious hearing deficits are noted. The patient is hard of hearing.  Motor: The motor testing reveals 5 over 5 strength of all 4 extremities. Good symmetric motor tone is noted throughout.  Sensory: Sensory testing is intact to pinprick, soft touch, vibration sensation, and position sense on all 4 extremities, with exception of some decrease in vibration sensation on the right foot as compared to left. No evidence of extinction is noted.  Coordination: Cerebellar testing reveals good finger-nose-finger and heel-to-shin bilaterally.  Gait and station: Gait is normal. Tandem gait is slightly unsteady. Romberg is positive, the patient falls backwards. No drift is seen.  Reflexes: Deep tendon reflexes are symmetric and normal bilaterally. Toes are downgoing bilaterally.   Assessment/Plan:  1. History of ependymoma resection, VP shunt placement  2. History of seizures  The patient indicates that he is still having occasional episodes of blacking out. I have indicated that he should not be operating a motor vehicle. The patient will go up on the Topamax  taking 50 mg the morning and 100 mg in the evening. He will remain on Keppra taking 1500 mg twice daily. The patient was given a prescription for the Topamax. He will be set up for an EEG study, he will follow-up in office in about 6 months.  Jill Alexanders MD 01/20/2017 2:13 PM  [facility] [address] [place], [place] [address]  Phone [phone] Fax [phone]

## 2017-01-20 NOTE — Patient Instructions (Signed)
   We will go up on the Topamax 50 mg tablet to one in the morning and two in the evening.  Topamax (topiramate) is a seizure medication that has an FDA approval for seizures and for migraine headache. Potential side effects of this medication include weight loss, cognitive slowing, tingling in the fingers and toes, and carbonated drinks will taste bad. If any significant side effects are noted on this drug, please contact our office.

## 2017-01-27 ENCOUNTER — Ambulatory Visit (INDEPENDENT_AMBULATORY_CARE_PROVIDER_SITE_OTHER): Payer: Medicare Other | Admitting: Neurology

## 2017-01-27 ENCOUNTER — Telehealth: Payer: Self-pay | Admitting: Neurology

## 2017-01-27 DIAGNOSIS — R569 Unspecified convulsions: Secondary | ICD-10-CM | POA: Diagnosis not present

## 2017-01-27 NOTE — Procedures (Signed)
    History:  [name] Love is a 46 year old gentleman with a history of an ependymoma resection in 1995. The patient has had a recurrence of the tumor on several occasions, he has required a VP shunt placement. He has had radiation therapy to the brain. He has a history of seizures, he has been treated with Keppra and Topamax. He is being evaluated for this issue.  This is a routine EEG. No skull defects are noted. Medications include Xanax, Lexapro, Keppra, Synthroid, Neudexta, Risperdal, and Topamax.   EEG classification: Normal awake  Description of the recording: The background rhythms of this recording consists of a fairly well modulated medium amplitude alpha rhythm of 8 Hz that is reactive to eye opening and closure. As the record progresses, the patient appears to remain in the waking state throughout the recording. Photic stimulation was performed, resulting in a bilateral and symmetric photic driving response. Hyperventilation was also performed, resulting in a minimal buildup of the background rhythm activities without significant slowing seen. At no time during the recording does there appear to be evidence of spike or spike wave discharges or evidence of focal slowing. EKG monitor shows no evidence of cardiac rhythm abnormalities with a heart rate of 78.  Impression: This is a normal EEG recording in the waking state. No evidence of ictal or interictal discharges are seen.

## 2017-01-27 NOTE — Telephone Encounter (Signed)
I called the patient.  The EEG study was normal. 

## 2017-02-24 ENCOUNTER — Telehealth: Payer: Self-pay | Admitting: Neurology

## 2017-02-24 DIAGNOSIS — R269 Unspecified abnormalities of gait and mobility: Secondary | ICD-10-CM

## 2017-02-24 NOTE — Addendum Note (Signed)
Addended by: Kathrynn Ducking on: 02/24/2017 01:40 PM   Modules accepted: Orders

## 2017-02-24 NOTE — Telephone Encounter (Signed)
Pt POA(aunt ) is calling with concern about pt, she states he lays in the bed all day and all night.  She would like a call back to know if maybe he could be set up for some type of Physical therapy

## 2017-02-24 NOTE — Telephone Encounter (Signed)
I called the patient, left a message, I will call back later. 

## 2017-02-24 NOTE — Telephone Encounter (Signed)
Called and scheduled f/u for 6/4/187 at 130pm, check in 100pm. Spoke with Hassan Rowan. She verbalized understanding and appreciation for call.

## 2017-02-24 NOTE — Telephone Encounter (Signed)
I called and talked with his POA, [name]. The patient is lethargic, sleeping all day. This could potentially be a side effect from the [place], we may need to consider switching him off of this medication and potentially go to Lamictal.  The patient fell last week, not sure that this was a seizure, the ground was slippery and wet. The patient does have a balance issue, we will get physical therapy set up for him. This will be done at [facility].  I will get an earlier revisit to consider a medication switch.

## 2017-03-03 DIAGNOSIS — R2689 Other abnormalities of gait and mobility: Secondary | ICD-10-CM | POA: Diagnosis not present

## 2017-03-03 DIAGNOSIS — R262 Difficulty in walking, not elsewhere classified: Secondary | ICD-10-CM | POA: Diagnosis not present

## 2017-03-03 DIAGNOSIS — M6281 Muscle weakness (generalized): Secondary | ICD-10-CM | POA: Diagnosis not present

## 2017-03-03 DIAGNOSIS — M25519 Pain in unspecified shoulder: Secondary | ICD-10-CM | POA: Diagnosis not present

## 2017-03-07 ENCOUNTER — Encounter: Payer: Self-pay | Admitting: Neurology

## 2017-03-07 ENCOUNTER — Ambulatory Visit (INDEPENDENT_AMBULATORY_CARE_PROVIDER_SITE_OTHER): Payer: Medicare Other | Admitting: Neurology

## 2017-03-07 VITALS — BP 107/81 | HR 95 | Ht 66.0 in | Wt 189.5 lb

## 2017-03-07 DIAGNOSIS — R569 Unspecified convulsions: Secondary | ICD-10-CM

## 2017-03-07 DIAGNOSIS — Z5181 Encounter for therapeutic drug level monitoring: Secondary | ICD-10-CM | POA: Diagnosis not present

## 2017-03-07 MED ORDER — LAMOTRIGINE 25 MG PO TABS: ORAL_TABLET | ORAL | 2 refills | Status: DC

## 2017-03-07 NOTE — Patient Instructions (Addendum)
   With the Keppra 1000 mg tablet, cut back to one twice a day.  Start Lamictal 25 mg one twice a day for 2 weeks, then   Take 2 tablets twice a day for 2 weeks, then  Take 3 tablets twice a day for 2 weeks, then  Call our office for dose adjustments.  [phone].  Lamictal (lamotrigine) is a seizure medication that occasionally may be used for other purposes such as peripheral neuropathy pain or certain types of headache. This medication is relatively safe, but occasionally side effects can occur. A skin rash may occur when first starting the medication. As with any seizure medication, depression may worsen on the drug. Other potential side effects include dizziness, headache, drowsiness or insomnia, decreased concentration, or stomach upset. This medication may also be used as a mood stabilizer. If you believe that you are having side effects on the medication, please contact our office.

## 2017-03-07 NOTE — Telephone Encounter (Signed)
I called the POA. I discussed the medication changes delineated on the revisit today.  I am concerned the patient may have a progressive dementing illness associated with prior brain radiation.  Hopefully the medication changes may help, he will have a MRI of the brain done in the near future.

## 2017-03-07 NOTE — Telephone Encounter (Signed)
Pt POA calling because she was unable to attend appointment with pt today but she is available to be called back and discuss what took place in the pt's appointment with Dr Jannifer Franklin

## 2017-03-07 NOTE — Progress Notes (Signed)
Reason for visit: Seizures  [name] Love is an 46 y.o. male  History of present illness:  [name] Love is a 46 year old right-handed white male with a history of an ependymoma resection, status post VP shunt placement. The patient has a history of seizures. He is on Topamax and Keppra. A recent EEG study was unremarkable. The patient comes back today for evaluation of excessive drowsiness and fatigue. The patient indicates that he will go to bed around 8 or 9:00 in the evening and sleep until about 10 AM, some days he may actually spend time in bed until about 4 PM. The patient gets up out of bed, goes to a recliner and sometimes he will fall sleep in the recliner. He has a very low level of energy. He has a gait disturbance, he has not had any falls, uses a cane for ambulation. He feels that he has difficulty concentrating and he indicates that she is "in his own world". He reports bilateral shoulder discomfort, has no pain when the arms are resting, he does have discomfort when he holds his arm up and especially if he reaches backwards. He is in physical therapy for this. The patient feels that his quality of life is very poor, he denies that he feels anxious or depressed. He comes back to the office today for an evaluation.  Past Medical History:  Diagnosis Date  . Brain cancer ([place])   . Seizures ([place])     Past Surgical History:  Procedure Laterality Date  . BRAIN SURGERY    . HERNIA REPAIR    . SHUNT REVISION      Family History  Problem Relation Age of Onset  . Brain cancer Mother   . High blood pressure Mother     Social history:  reports that he has never smoked. He has never used smokeless tobacco. He reports that he does not drink alcohol or use drugs.    Allergies  Allergen Reactions  . Sulfa Antibiotics   . Contrast Media [Iodinated Diagnostic Agents] Rash    Medications:  Prior to Admission medications   Medication Sig Start Date End Date Taking? Authorizing Provider   ALPRAZolam (XANAX) 0.25 MG tablet Take 0.25 mg by mouth daily. 1 tablet in the morning and 2 tablets at bedtime 01/13/17  Yes [provider]  escitalopram (LEXAPRO) 10 MG tablet Take 10 mg by mouth daily. 12/31/16  Yes [provider]  levETIRAcetam (KEPPRA) 1000 MG tablet Take 1.5 tablets by mouth 2 (two) times daily. 12/31/16  Yes [provider]  levothyroxine (SYNTHROID, LEVOTHROID) 50 MCG tablet Take 1 tablet by mouth daily. 12/25/16  Yes [provider]  NUEDEXTA 20-10 MG CAPS Take 1 capsule by mouth 2 (two) times daily. 12/31/16  Yes [provider]  risperiDONE (RISPERDAL) 0.5 MG tablet Take 0.5 mg by mouth at bedtime. 01/19/17  Yes [provider]  topiramate (TOPAMAX) 50 MG tablet One tablet in the morning and two in the evening 01/20/17  Yes Kathrynn Ducking, MD    ROS:  Out of a complete 14 system review of symptoms, the patient complains only of the following symptoms, and all other reviewed systems are negative.  Fatigue, excessive drowsiness  Blood pressure 107/81, pulse 95, height 5\' 6"  (1.676 m), weight 189 lb 8 oz (86 kg).  Physical Exam  General: The patient is alert and cooperative at the time of the examination. The patient has a flat affect, cognitive slowing.  Skin: No significant  peripheral edema is noted.   Neurologic Exam  Mental status: The patient is alert and oriented x 3 at the time of the examination. The patient has apparent normal recent and remote memory, with an apparently normal attention span and concentration ability.   Cranial nerves: Facial symmetry is present. Speech is normal, no aphasia or dysarthria is noted. Extraocular movements are full. Visual fields are full.  Motor: The patient has good strength in all 4 extremities.  Sensory examination: Soft touch sensation is symmetric on the face, arms, and legs.  Coordination: The patient has good finger-nose-finger and heel-to-shin  bilaterally. The patient has some apraxia with the use of the arms.  Gait and station: The patient has a slightly wide-based, unsteady gait. The patient uses a cane for an ablation. Tandem gait is unsteady. Romberg is positive, the patient falls backwards.. No drift is seen.  Reflexes: Deep tendon reflexes are symmetric.   Assessment/Plan:  1. History of ependymoma resection, VP shunt placement  2. History of seizures  3. Excessive daytime drowsiness, fatigue  The patient claims he will be having another MRI of the brain on 03/29/2017. He will try to get me a report of the study. The patient has had whole brain radiation, he does have cognitive slowing and history of seizures. He is having excessive drowsiness and fatigue, he denies depression. The use of Keppra may increase drowsiness. We will try switching off of Keppra to Lamictal to see if this improves the excessive drowsiness. The patient will drop the Keppra down to 1000 mg twice daily, he will be placed on Lamictal, gradually working up on the dose, he will call me when he gets to 75 mg twice daily. He will continue the Topamax. He will follow-up for his next scheduled appointment.  Jill Alexanders MD 03/07/2017 1:55 PM  [facility] [address] [place], [place] [address]  Phone [phone] Fax [phone]

## 2017-03-08 ENCOUNTER — Telehealth: Payer: Self-pay | Admitting: *Deleted

## 2017-03-08 LAB — COMPREHENSIVE METABOLIC PANEL
A/G RATIO: 2 (ref 1.2–2.2)
ALT: 12 IU/L (ref 0–44)
AST: 17 IU/L (ref 0–40)
Albumin: 4.4 g/dL (ref 3.5–5.5)
Alkaline Phosphatase: 117 IU/L (ref 39–117)
BILIRUBIN TOTAL: 0.5 mg/dL (ref 0.0–1.2)
BUN / CREAT RATIO: 5 — AB (ref 9–20)
BUN: 6 mg/dL (ref 6–24)
CHLORIDE: 104 mmol/L (ref 96–106)
CO2: 23 mmol/L (ref 18–29)
Calcium: 9.2 mg/dL (ref 8.7–10.2)
Creatinine, Ser: 1.23 mg/dL (ref 0.76–1.27)
GFR calc Af Amer: 83 mL/min/{1.73_m2} (ref >59)
GFR calc non Af Amer: 71 mL/min/{1.73_m2} (ref >59)
GLOBULIN, TOTAL: 2.2 g/dL (ref 1.5–4.5)
Glucose: 79 mg/dL (ref 65–99)
POTASSIUM: 3.8 mmol/L (ref 3.5–5.2)
SODIUM: 142 mmol/L (ref 134–144)
Total Protein: 6.6 g/dL (ref 6.0–8.5)

## 2017-03-08 LAB — CBC WITH DIFFERENTIAL/PLATELET
Basophils Absolute: 0 10*3/uL (ref 0.0–0.2)
Basos: 0 %
EOS (ABSOLUTE): 0.2 10*3/uL (ref 0.0–0.4)
EOS: 2 %
HEMATOCRIT: 46.7 % (ref 37.5–51.0)
Hemoglobin: 15.7 g/dL (ref 13.0–17.7)
IMMATURE GRANULOCYTES: 0 %
Immature Grans (Abs): 0 10*3/uL (ref 0.0–0.1)
Lymphocytes Absolute: 1.5 10*3/uL (ref 0.7–3.1)
Lymphs: 21 %
MCH: 30.6 pg (ref 26.6–33.0)
MCHC: 33.6 g/dL (ref 31.5–35.7)
MCV: 91 fL (ref 79–97)
MONOS ABS: 0.6 10*3/uL (ref 0.1–0.9)
Monocytes: 8 %
NEUTROS PCT: 69 %
Neutrophils Absolute: 4.7 10*3/uL (ref 1.4–7.0)
Platelets: 250 10*3/uL (ref 150–379)
RBC: 5.13 x10E6/uL (ref 4.14–5.80)
RDW: 12.8 % (ref 12.3–15.4)
WBC: 6.9 10*3/uL (ref 3.4–10.8)

## 2017-03-08 LAB — VITAMIN B12: Vitamin B-12: 764 pg/mL (ref 232–1245)

## 2017-03-08 LAB — TSH: TSH: 0.816 u[IU]/mL (ref 0.450–4.500)

## 2017-03-08 NOTE — Telephone Encounter (Signed)
-----   Message from Kathrynn Ducking, MD sent at 03/08/2017  7:46 AM EDT -----  The blood work results are unremarkable. Please call the patient.  ----- Message ----- From: Lavone Neri Lab Results In Sent: 03/08/2017   7:43 AM To: Kathrynn Ducking, MD

## 2017-03-08 NOTE — Telephone Encounter (Signed)
Pt aunt called back, results were shared.  No need to call back .

## 2017-03-08 NOTE — Telephone Encounter (Signed)
Noted, thank you

## 2017-03-08 NOTE — Telephone Encounter (Signed)
Called and LVM For [name] Love [facility]) to call about lab results.   *Ok to inform her labs unremarkable per CW,MD

## 2017-03-09 DIAGNOSIS — M6281 Muscle weakness (generalized): Secondary | ICD-10-CM | POA: Diagnosis not present

## 2017-03-09 DIAGNOSIS — R262 Difficulty in walking, not elsewhere classified: Secondary | ICD-10-CM | POA: Diagnosis not present

## 2017-03-09 DIAGNOSIS — M25519 Pain in unspecified shoulder: Secondary | ICD-10-CM | POA: Diagnosis not present

## 2017-03-09 DIAGNOSIS — R2689 Other abnormalities of gait and mobility: Secondary | ICD-10-CM | POA: Diagnosis not present

## 2017-03-15 DIAGNOSIS — R262 Difficulty in walking, not elsewhere classified: Secondary | ICD-10-CM | POA: Diagnosis not present

## 2017-03-15 DIAGNOSIS — M6281 Muscle weakness (generalized): Secondary | ICD-10-CM | POA: Diagnosis not present

## 2017-03-15 DIAGNOSIS — R2689 Other abnormalities of gait and mobility: Secondary | ICD-10-CM | POA: Diagnosis not present

## 2017-03-15 DIAGNOSIS — M25519 Pain in unspecified shoulder: Secondary | ICD-10-CM | POA: Diagnosis not present

## 2017-03-21 ENCOUNTER — Telehealth: Payer: Self-pay | Admitting: Neurology

## 2017-03-21 MED ORDER — BACLOFEN 10 MG PO TABS: 5.0000 mg | ORAL_TABLET | Freq: Three times a day (TID) | ORAL | 2 refills | Status: DC

## 2017-03-21 NOTE — Telephone Encounter (Signed)
Patients aunt Hassan Rowan [facility]) called office in reference to levETIRAcetam (KEPPRA) 1000 MG tablet taking 2 times daily and also started lamoTRIgine (LAMICTAL) 25 MG tablet.  Hassan Rowan would like to know how long he is to continue the Keppra 2 times daily or if he is to cut back again.  Please call

## 2017-03-21 NOTE — Telephone Encounter (Signed)
Patient aunt Hassan Rowan [facility]) called office in reference to patient doing Physical therapy, but continuing to have pain.  Physical Therapy is helping yet still having still so much pain.  Per aunt patient has been taking so much ibuprofen that she gave him half of her muscle relaxer.  Patient unable to sleep due to the pain.  Please call

## 2017-03-21 NOTE — Telephone Encounter (Signed)
The patient is working up on the Lamictal, he will stay on Keppra dosing 1000 mg twice daily until he gets to 75 mg twice daily of the Lamictal. We will then convert him to 100 mg of Lamictal twice daily and start tapering him off of Keppra.  The patient is getting physical therapy, he is having significant increase in neuromuscular discomfort, he took his aunts baclofen 10 mg which seems to help, I will call in a small prescription for him taking one half tablet 3 times daily.

## 2017-03-21 NOTE — Addendum Note (Signed)
Addended by: Kathrynn Ducking on: 03/21/2017 06:24 PM   Modules accepted: Orders

## 2017-03-22 DIAGNOSIS — M6281 Muscle weakness (generalized): Secondary | ICD-10-CM | POA: Diagnosis not present

## 2017-03-22 DIAGNOSIS — R262 Difficulty in walking, not elsewhere classified: Secondary | ICD-10-CM | POA: Diagnosis not present

## 2017-03-22 DIAGNOSIS — R2689 Other abnormalities of gait and mobility: Secondary | ICD-10-CM | POA: Diagnosis not present

## 2017-03-22 DIAGNOSIS — M25519 Pain in unspecified shoulder: Secondary | ICD-10-CM | POA: Diagnosis not present

## 2017-03-29 DIAGNOSIS — Z86011 Personal history of benign neoplasm of the brain: Secondary | ICD-10-CM | POA: Diagnosis not present

## 2017-03-29 DIAGNOSIS — C719 Malignant neoplasm of brain, unspecified: Secondary | ICD-10-CM | POA: Diagnosis not present

## 2017-03-29 DIAGNOSIS — D332 Benign neoplasm of brain, unspecified: Secondary | ICD-10-CM | POA: Diagnosis not present

## 2017-03-29 DIAGNOSIS — Z6829 Body mass index (BMI) 29.0-29.9, adult: Secondary | ICD-10-CM | POA: Diagnosis not present

## 2017-04-01 DIAGNOSIS — R262 Difficulty in walking, not elsewhere classified: Secondary | ICD-10-CM | POA: Diagnosis not present

## 2017-04-01 DIAGNOSIS — R2689 Other abnormalities of gait and mobility: Secondary | ICD-10-CM | POA: Diagnosis not present

## 2017-04-01 DIAGNOSIS — M25519 Pain in unspecified shoulder: Secondary | ICD-10-CM | POA: Diagnosis not present

## 2017-04-01 DIAGNOSIS — M6281 Muscle weakness (generalized): Secondary | ICD-10-CM | POA: Diagnosis not present

## 2017-04-07 NOTE — Telephone Encounter (Signed)
I spoke to patient's health power of attorney [name] who informed me that he was still having significant shoulder pain and she was requesting a CT scan. I deferred this to Dr. Jannifer Franklin upon his return from vacation next week. She has recent increase his baclofen 2 days ago to 1 tablet 3 times daily. I recommend she wait till the weekend and then try increasing it to 1-1/2 tablet 3 times daily. She requested a prescription of ibuprofen 80o mg 3 times daily. I informed her that that was available over-the-counter and did not need a prescription. She was advised to call back next week for  Dr. Jannifer Franklin is opinion. She voiced understanding.

## 2017-04-07 NOTE — Telephone Encounter (Signed)
Pt aunt [name]) calling requesting an increase of pt Baclofen.  Also as a result of pt going to physical therapy pt aunt states that they told her pt might need a CT scan due to arm and shoulder pain. Pt told aunt that his pain level this morning (at 3:00am) was on a 10. Pt is asking for a prescription of 800 mg of Ibprofen, please call aunt

## 2017-04-08 ENCOUNTER — Other Ambulatory Visit: Payer: Self-pay | Admitting: Neurology

## 2017-04-08 MED ORDER — BACLOFEN 10 MG PO TABS: 15.0000 mg | ORAL_TABLET | Freq: Three times a day (TID) | ORAL | 1 refills | Status: DC

## 2017-04-08 NOTE — Telephone Encounter (Signed)
Pt Aunt(Ms Sarvis on DPR) calling stating Dr Leonie Man told her that he would call in another prescription of the baclofen to the [facility]. Mr Achilles Dunk said she checked with the pharmacy and it is not there, I read Dr Clydene Fake note and informed her I did not see that from note, she stated he did inform her of that, please call

## 2017-04-08 NOTE — Telephone Encounter (Signed)
Message sent to Dr. Felecia Shelling work in am for review.

## 2017-04-08 NOTE — Telephone Encounter (Signed)
Rn call patients [name] Love. Rn stated the baclofen was sent to [facility] in [place]. Rn apologize for the delay. Hassan Rowan stated was it call in. Rn stated all refills are sent electronically. Pts aunt verbalized understanding.

## 2017-04-08 NOTE — Telephone Encounter (Signed)
I sent in the baclofen at 1.5 pills tid to [facility]

## 2017-04-11 ENCOUNTER — Telehealth: Payer: Self-pay | Admitting: Neurology

## 2017-04-11 NOTE — Telephone Encounter (Signed)
[name] Love patient's POA is calling to advise he had PT and they advised him to see an orthopedist for shoulder and arm pain. He is scheduled at [facility] tomorrow at 2:30pm.

## 2017-04-12 ENCOUNTER — Telehealth: Payer: Self-pay | Admitting: Neurology

## 2017-04-12 DIAGNOSIS — M542 Cervicalgia: Secondary | ICD-10-CM | POA: Diagnosis not present

## 2017-04-12 DIAGNOSIS — M25511 Pain in right shoulder: Secondary | ICD-10-CM | POA: Diagnosis not present

## 2017-04-12 DIAGNOSIS — M25512 Pain in left shoulder: Secondary | ICD-10-CM | POA: Diagnosis not present

## 2017-04-12 NOTE — Telephone Encounter (Signed)
I talk with [facility] orthopedics, they indicate that the patient does not have intrinsic shoulder disease, they believe the pain in the shoulders as coming from the neck.  The patient indicates that he had MRI of the brain and cervical spine done within the last month in [place], [place]. We do not have the results of the studies.  We may need to get the results of the MRI evaluation before we pursue further evaluation.

## 2017-04-13 NOTE — Telephone Encounter (Signed)
I called and talked with the aunt. The patient is having bilateral shoulder discomfort, the orthopedic doctor does not think he has shoulder disease, we will get CT of the cervical spine, the patient has a programable shunt.  He cannot have Ultram for pain given the history of seizures, they do not want him taking hydrocodone for risk of addiction.  The patient had been getting physical therapy, but this has recently been stopped.  The patient currently is gaining baclofen 10 mg twice during the day and 15 mg at night. The patient is getting 800 mg 4 times daily of ibuprofen.

## 2017-04-13 NOTE — Telephone Encounter (Signed)
[name] Love is calling to discuss MRI's with Dr. Jannifer Franklin. She says [name] Love is in excruciating pain.

## 2017-04-13 NOTE — Addendum Note (Signed)
Addended by: Kathrynn Ducking on: 04/13/2017 05:14 PM   Modules accepted: Orders

## 2017-04-14 MED ORDER — HYDROCODONE-ACETAMINOPHEN 5-325 MG PO TABS: 1.0000 | ORAL_TABLET | Freq: Four times a day (QID) | ORAL | 0 refills | Status: DC | PRN

## 2017-04-14 NOTE — Telephone Encounter (Signed)
Called and spoke with Hassan Rowan [facility]) and scheduled appt for 04/18/17 at 230pm, check in 200pm. She agreed to date/time.   Also advised rx hydrocodone ready for pick up at office. She verbalized understanding.

## 2017-04-14 NOTE — Addendum Note (Signed)
Addended by: Kathrynn Ducking on: 04/14/2017 09:04 AM   Modules accepted: Orders

## 2017-04-14 NOTE — Telephone Encounter (Signed)
Pt aunt([name] Love on DPR) calling to inform pt is in extreme pain as of 6pm last night and would now for Dr Jannifer Franklin to call in medication that he offered to pt's aunt yesterday afternoon. Ms [name] Love stated the pain is up and down pt's back going from buttox to calves of legs.Ms [name] Love said that she would like for Dr Jannifer Franklin to go ahead and order the MRI for pt.  Please call Ms [name] Love re: pain management of pt.

## 2017-04-14 NOTE — Telephone Encounter (Signed)
I called the aunt. The patient has gone from having some discomfort in the shoulders with elevation, no pain at rest that was noted in early June 2018. The patient has been in physical therapy, he has developed pain in the neck and shoulders that is more severe, and he now has pain down the spine into the low back and down the legs into the calf muscles that began last night.  I will give a prescription for hydrocodone, but I will need to see the patient in the office for further evaluation.  We will still plan on getting CT scan evaluation of the cervical spine.

## 2017-04-18 ENCOUNTER — Ambulatory Visit (INDEPENDENT_AMBULATORY_CARE_PROVIDER_SITE_OTHER): Payer: Medicare Other | Admitting: Neurology

## 2017-04-18 ENCOUNTER — Encounter: Payer: Self-pay | Admitting: Neurology

## 2017-04-18 ENCOUNTER — Ambulatory Visit
Admission: RE | Admit: 2017-04-18 | Discharge: 2017-04-18 | Disposition: A | Discharge status: Home / Self Care | Payer: Medicare Other | Source: Ambulatory Visit | Attending: Neurology | Admitting: Neurology

## 2017-04-18 ENCOUNTER — Telehealth: Payer: Self-pay | Admitting: Neurology

## 2017-04-18 VITALS — BP 120/81 | HR 82 | Ht 66.0 in | Wt 200.0 lb

## 2017-04-18 DIAGNOSIS — M791 Myalgia, unspecified site: Secondary | ICD-10-CM | POA: Insufficient documentation

## 2017-04-18 DIAGNOSIS — Z982 Presence of cerebrospinal fluid drainage device: Secondary | ICD-10-CM

## 2017-04-18 DIAGNOSIS — M542 Cervicalgia: Secondary | ICD-10-CM

## 2017-04-18 DIAGNOSIS — R569 Unspecified convulsions: Secondary | ICD-10-CM | POA: Diagnosis not present

## 2017-04-18 DIAGNOSIS — M5020 Other cervical disc displacement, unspecified cervical region: Secondary | ICD-10-CM | POA: Diagnosis not present

## 2017-04-18 HISTORY — DX: Myalgia, unspecified site: M79.10

## 2017-04-18 MED ORDER — LAMOTRIGINE 100 MG PO TABS: 100.0000 mg | ORAL_TABLET | Freq: Two times a day (BID) | ORAL | 3 refills | Status: DC

## 2017-04-18 MED ORDER — LEVETIRACETAM 500 MG PO TABS: ORAL_TABLET | ORAL | 2 refills | Status: DC

## 2017-04-18 NOTE — Progress Notes (Signed)
Reason for visit: Seizures  [name] Love is an 46 y.o. male  History of present illness:  [name] Love is a 46 year old right-handed white male with a history of ependymoma resection, and a right-sided VP shunt placement. The patient has come in for an urgent work in, he has noted some shoulder discomfort that was present when he was last seen on 03/07/2017. The patient indicated that his shoulder pain was better when he was resting his arms but came on when he used his arms, and he particularly had pain when he reached behind his back. The patient was in physical therapy but his pain continued to worsen. He was seen by orthopedic surgery, they did not feel that the problem was orthopedic in nature. Over the last 2 weeks he has had generalization of the pain that now goes down the back to the low back area, and into the hips and down both legs. He reports pain down the arms bilaterally and tingling in the hands and in the feet. The patient denies any pain with squeezing of the muscles. He does not have headache. He has chronic gait instability, there have been no changes in balance, he has had some increased problems with voiding the bladder when he was placed on baclofen. He indicates that when he looks up, he may have some discomfort down both arms. He has been set up for CT scan of the cervical spine, this will be done later today. He has been placed on hydrocodone for the pain. He returns for an evaluation. He uses a cane for ambulation.  Past Medical History:  Diagnosis Date  . Brain cancer ([place])   . Seizures ([place])     Past Surgical History:  Procedure Laterality Date  . BRAIN SURGERY    . HERNIA REPAIR    . SHUNT REVISION      Family History  Problem Relation Age of Onset  . Brain cancer Mother   . High blood pressure Mother     Social history:  reports that he has never smoked. He has never used smokeless tobacco. He reports that he does not drink alcohol or use drugs.      Allergies  Allergen Reactions  . Sulfa Antibiotics   . Contrast Media [Iodinated Diagnostic Agents] Rash    Medications:  Prior to Admission medications   Medication Sig Start Date End Date Taking? Authorizing Provider  ALPRAZolam (XANAX) 0.25 MG tablet Take 0.25 mg by mouth daily. 1 tablet in the morning and 2 tablets at bedtime 01/13/17  Yes [provider]  baclofen (LIORESAL) 10 MG tablet Take 1.5 tablets (15 mg total) by mouth 3 (three) times daily. 04/08/17  Yes Love, [name] Means, MD  escitalopram (LEXAPRO) 10 MG tablet Take 10 mg by mouth daily. 12/31/16  Yes [provider]  HYDROcodone-acetaminophen (NORCO/VICODIN) 5-325 MG tablet Take 1 tablet by mouth every 6 (six) hours as needed for moderate pain. 04/14/17  Yes [name] Ducking, MD  lamoTRIgine (LAMICTAL) 25 MG tablet Three tablets twice a day 03/07/17  Yes [name] Ducking, MD  levETIRAcetam (KEPPRA) 1000 MG tablet Take 1.5 tablets by mouth 2 (two) times daily. 12/31/16  Yes [provider]  levothyroxine (SYNTHROID, LEVOTHROID) 50 MCG tablet Take 1 tablet by mouth daily. 12/25/16  Yes [provider]  NUEDEXTA 20-10 MG CAPS Take 1 capsule by mouth 2 (two) times daily. 12/31/16  Yes [provider]  risperiDONE (RISPERDAL) 0.5 MG tablet Take 0.5 mg by mouth  at bedtime. 01/19/17  Yes [provider]  topiramate (TOPAMAX) 50 MG tablet One tablet in the morning and two in the evening 01/20/17  Yes [name] Ducking, MD    ROS:  Out of a complete 14 system review of symptoms, the patient complains only of the following symptoms, and all other reviewed systems are negative.  Constipation Joint pain, back pain, achy muscles, muscle cramps, walking difficulty, neck pain, neck stiffness  Blood pressure 120/81, pulse 82, height 5\' 6"  (1.676 m), weight 200 lb (90.7 kg).  Physical Exam  General: The patient is alert and cooperative at the time of the examination.  Neuromuscular: Range  of movement of the cervical spine is relatively full.  Skin: No significant peripheral edema is noted.   Neurologic Exam  Mental status: The patient is alert and oriented x 3 at the time of the examination. The patient has apparent normal recent and remote memory, with an apparently normal attention span and concentration ability.   Cranial nerves: Facial symmetry is present. Speech is normal, no aphasia or dysarthria is noted. Extraocular movements are full. Visual fields are full.  Motor: The patient has good strength in all 4 extremities.  Sensory examination: Soft touch sensation is symmetric on the face, arms, and legs.  Coordination: The patient has good finger-nose-finger and heel-to-shin bilaterally.  Gait and station: The patient has a slightly wide-based gait. The patient walks with a cane. Romberg is positive, the patient falls backwards. Romberg is negative. No drift is seen.  Reflexes: Deep tendon reflexes are symmetric, reflexes are decreased in arms, present in the legs.   Assessment/Plan:  1. History of seizures  2. Ependymoma resection, VP shunt placement  3. Chronic gait disturbance  4. Neuromuscular discomfort, neck, arms, shoulders, back, and legs  The patient has had a recent evolution of neuromuscular discomfort in the arms and legs, shoulders and low back. The etiology of this recent pain is not clear. The patient does not have any true weakness on clinical examination. The patient will be sent for CT scan of the cervical spine, he will have blood work done today. He has moved to this area from the [place], [place] area and he will need a neurosurgeon in this region. He will be referred to [name] Love. He will follow-up through this office for his next scheduled appointment.  With the seizure medications, the Lamictal will be increased to 100 mg twice daily, the Keppra will be decreased taking 500 mg in the morning and 1000 mg in the evening for 2  weeks and then go to 500 mg twice daily. We will eventually get him off of the Keppra completely.  [name] Alexanders MD 04/18/2017 12:23 PM  [facility] [address] [place], [place] [address]  Phone [phone] Fax [phone]

## 2017-04-18 NOTE — Patient Instructions (Signed)
   We will check blood work today, CT of the cervical spine will be done, we will make a referral to Neurosurgery.

## 2017-04-18 NOTE — Telephone Encounter (Signed)
I called and left a message. The CT of the cervical spine was unremarkable, we will check blood work, if this is unremarkable I will go on to do EMG and nerve conduction study to evaluate the myalgias and paresthesias.   CT cervical 04/18/17:  IMPRESSION: 1. No acute fracture or static subluxation of the cervical spine. 2. Preserved disc spaces without neural foraminal or spinal canal stenosis. 3. Status post remote suboccipital craniectomy with unchanged appearance of dystrophic calcification in the cerebellum.

## 2017-04-19 ENCOUNTER — Telehealth: Payer: Self-pay | Admitting: Neurology

## 2017-04-19 NOTE — Telephone Encounter (Signed)
I called [name] Love. The patient has had ongoing total body pain. CT the neck does not explain the pain.  The patient was having bilateral shoulder pain before we start Lamictal, but Lamictal can cause neck and back discomfort.  We will try taper off of Lamictal by 50 mg every week until off the drug. If the pain improves the Lamictal may be an exacerbating factor.  So far, all the blood work is normal.

## 2017-04-19 NOTE — Telephone Encounter (Signed)
[name] Love POA for patient is returning a call regarding CT results. The patient is having telephone problems and cannot be reached.

## 2017-04-19 NOTE — Telephone Encounter (Signed)
Called Brandy back. Advised CW,MD sent in new rx yesterday for Keppra 500mg  tablet, directions: One tablet in the morning and 2 in the evening for 2 weeks, then take one twice a day. She should d/c other rx keppra 1000mg . She verbalized understanding and has no further questions at this time.

## 2017-04-19 NOTE — Telephone Encounter (Addendum)
Called [place] back. Relayed results per CW,MD note below. She verbalized understanding.  Advised we will call once lab results come back. Gave phone number to Dr. Christella Noa telephone [phone] in case they do not hear about scheduling appt.

## 2017-04-19 NOTE — Telephone Encounter (Signed)
Brandy with [facility] in [place] is calling to find out if patient is to take levETIRAcetam (KEPPRA) 500 MG tablet or levETIRAcetam (KEPPRA) 1000 MG tablet.

## 2017-04-20 NOTE — Telephone Encounter (Signed)
[name] Love is calling back to discuss conversation she had with Dr. Jannifer Franklin yesterday.

## 2017-04-20 NOTE — Telephone Encounter (Signed)
I called [name] Love. The patient never got up to the 100 mg twice daily dose of Lamictal, he still on 75 mg twice daily. He will use the 25 to come off of the Lamictal, he would go to 50 mg twice daily for one week, then 25 mg twice daily for one week then stop.  They will let you know he still has neuromuscular pain.

## 2017-04-22 LAB — ANGIOTENSIN CONVERTING ENZYME: ANGIO CONVERT ENZYME: 59 U/L (ref 14–82)

## 2017-04-22 LAB — B. BURGDORFI ANTIBODIES: Lyme IgG/IgM Ab: <0.91 {ISR} (ref 0.00–0.90)

## 2017-04-22 LAB — ANA W/REFLEX: Anti Nuclear Antibody(ANA): NEGATIVE

## 2017-04-22 LAB — RHEUMATOID FACTOR: Rhuematoid fact SerPl-aCnc: <10 IU/mL (ref 0.0–13.9)

## 2017-04-22 LAB — CK: CK TOTAL: 68 U/L (ref 24–204)

## 2017-04-22 LAB — SEDIMENTATION RATE: SED RATE: 2 mm/h (ref 0–15)

## 2017-04-25 ENCOUNTER — Telehealth: Payer: Self-pay | Admitting: *Deleted

## 2017-04-25 MED ORDER — LEVETIRACETAM 1000 MG PO TABS: 1000.0000 mg | ORAL_TABLET | Freq: Two times a day (BID) | ORAL | 3 refills | Status: DC

## 2017-04-25 NOTE — Telephone Encounter (Signed)
-----   Message from Kathrynn Ducking, MD sent at 04/22/2017  2:44 PM EDT -----   The blood work results are unremarkable. Please call the patient.  ----- Message ----- From: Lavone Neri Lab Results In Sent: 04/19/2017   7:45 AM To: Kathrynn Ducking, MD

## 2017-04-25 NOTE — Telephone Encounter (Signed)
Called and spoke with [place] (POA). Relayed that labs were unremarkable per CW,MD note. She verbalized understanding.   She stated that she talked with CW,MD last Thursday who advised pt to taper of lamictal. She wanted to clarify how he should continue to take keppra now.  He has been taking two 500mg  tablets of keppra twice daily.   Advised I will check with CW,MD and we will call back. We may have to call in new rx for pt.

## 2017-04-25 NOTE — Telephone Encounter (Signed)
   We will keep the patient on Keppra at 1000 mg twice daily, a prescription will be called in.

## 2017-04-28 ENCOUNTER — Telehealth: Payer: Self-pay | Admitting: Neurology

## 2017-04-28 MED ORDER — HYDROCODONE-ACETAMINOPHEN 5-325 MG PO TABS: 1.0000 | ORAL_TABLET | Freq: Four times a day (QID) | ORAL | 0 refills | Status: DC | PRN

## 2017-04-28 NOTE — Telephone Encounter (Signed)
Hassan Rowan (patients aunt- POA) called office requesting refill for HYDROcodone-acetaminophen (NORCO/VICODIN) 5-325 MG tablet.  Hassan Rowan advised office closes at noon tomorrow.

## 2017-04-28 NOTE — Addendum Note (Signed)
Addended by: Kathrynn Ducking on: 04/28/2017 05:03 PM   Modules accepted: Orders

## 2017-04-28 NOTE — Telephone Encounter (Signed)
We will refill the hydrocodone, but we will not continue to give 50 tablets every 2 weeks, if there is an ongoing need for hydrocodone, we will need to restrict the total number of tablets and use non-addicting medications for pain control.

## 2017-05-03 ENCOUNTER — Telehealth: Payer: Self-pay | Admitting: *Deleted

## 2017-05-03 NOTE — Telephone Encounter (Signed)
Faxed signed POC back to [facility] at [phone]. Received confirmation.

## 2017-05-16 DIAGNOSIS — F0634 Mood disorder due to known physiological condition with mixed features: Secondary | ICD-10-CM | POA: Diagnosis not present

## 2017-05-16 DIAGNOSIS — F064 Anxiety disorder due to known physiological condition: Secondary | ICD-10-CM | POA: Diagnosis not present

## 2017-05-16 DIAGNOSIS — F482 Pseudobulbar affect: Secondary | ICD-10-CM | POA: Diagnosis not present

## 2017-05-24 ENCOUNTER — Telehealth: Payer: Self-pay | Admitting: Neurology

## 2017-05-24 NOTE — Telephone Encounter (Signed)
The hydrocodone will be due on August 23, it will be filled at that time.

## 2017-05-24 NOTE — Telephone Encounter (Signed)
[name] Love, patient's aunt requesting refill of  HYDROcodone-acetaminophen (NORCO/VICODIN) 5-325 MG tablet.

## 2017-05-26 ENCOUNTER — Telehealth: Payer: Self-pay | Admitting: *Deleted

## 2017-05-26 MED ORDER — HYDROCODONE-ACETAMINOPHEN 5-325 MG PO TABS: 1.0000 | ORAL_TABLET | Freq: Four times a day (QID) | ORAL | 0 refills | Status: DC | PRN

## 2017-05-26 NOTE — Telephone Encounter (Signed)
Took call from phone staff. Spoke with Robie Ridge from [facility]. She needed clarification for rx hydrocodone before they fill for patient. They need to know if rx for acute or chronic pain and if rx given 04/30/17 for same medication was for the same condition.  Can call back at [phone].  They are closed for lunch between 1:30-2:00pm

## 2017-05-26 NOTE — Telephone Encounter (Signed)
Placed printed/signed rx up front for patient pick up.  

## 2017-05-26 NOTE — Telephone Encounter (Signed)
I called pharmacist, the patient is being treated for a diffuse neuromuscular pain syndrome that began in June 2018.  The hydrocodone is not for acute postsurgical pain.

## 2017-05-26 NOTE — Addendum Note (Signed)
Addended by: Kathrynn Ducking on: 05/26/2017 07:20 AM   Modules accepted: Orders

## 2017-05-31 DIAGNOSIS — R030 Elevated blood-pressure reading, without diagnosis of hypertension: Secondary | ICD-10-CM | POA: Diagnosis not present

## 2017-05-31 DIAGNOSIS — M5440 Lumbago with sciatica, unspecified side: Secondary | ICD-10-CM | POA: Diagnosis not present

## 2017-05-31 DIAGNOSIS — Z6832 Body mass index (BMI) 32.0-32.9, adult: Secondary | ICD-10-CM | POA: Diagnosis not present

## 2017-05-31 DIAGNOSIS — M542 Cervicalgia: Secondary | ICD-10-CM | POA: Diagnosis not present

## 2017-06-01 ENCOUNTER — Other Ambulatory Visit: Payer: Self-pay | Admitting: Neurosurgery

## 2017-06-01 ENCOUNTER — Other Ambulatory Visit (HOSPITAL_COMMUNITY): Payer: Self-pay | Admitting: Neurosurgery

## 2017-06-01 DIAGNOSIS — M5440 Lumbago with sciatica, unspecified side: Secondary | ICD-10-CM

## 2017-06-08 ENCOUNTER — Encounter (HOSPITAL_COMMUNITY): Payer: Self-pay

## 2017-06-08 ENCOUNTER — Ambulatory Visit (HOSPITAL_COMMUNITY): Admission: RE | Admit: 2017-06-08 | Payer: Medicare Other | Source: Ambulatory Visit

## 2017-06-08 ENCOUNTER — Ambulatory Visit (HOSPITAL_COMMUNITY)
Admission: RE | Admit: 2017-06-08 | Discharge: 2017-06-08 | Disposition: A | Discharge status: Home / Self Care | Payer: Medicare Other | Source: Ambulatory Visit | Attending: Neurosurgery | Admitting: Neurosurgery

## 2017-06-08 DIAGNOSIS — M5440 Lumbago with sciatica, unspecified side: Secondary | ICD-10-CM | POA: Insufficient documentation

## 2017-06-08 MED ORDER — DIAZEPAM 5 MG PO TABS
10.0000 mg | ORAL_TABLET | Freq: Once | ORAL | Status: DC
  Filled 2017-06-08: qty 2

## 2017-06-08 MED ORDER — IOPAMIDOL (ISOVUE-M 200) INJECTION 41%: 20.0000 mL | Freq: Once | INTRAMUSCULAR | Status: DC

## 2017-06-08 MED ORDER — ONDANSETRON HCL 4 MG/2ML IJ SOLN: 4.0000 mg | Freq: Four times a day (QID) | INTRAMUSCULAR | Status: DC | PRN

## 2017-06-08 MED ORDER — LIDOCAINE HCL (PF) 1 % IJ SOLN: 5.0000 mL | Freq: Once | INTRAMUSCULAR | Status: DC

## 2017-06-08 NOTE — Progress Notes (Signed)
Dr Christella Noa notified of pt's contrast allergy.  Procedure is cancelled and will be rescheduled by Dr Lacy Duverney office with pretreatment for contrast allergy.  Dr Lacy Duverney office to call pt with instructions. This explained to pt and pt's Aunt who is POA.

## 2017-06-15 ENCOUNTER — Other Ambulatory Visit: Payer: Self-pay | Admitting: Neurology

## 2017-06-15 NOTE — Telephone Encounter (Signed)
Patient's [name] Love requesting refill of HYDROcodone-acetaminophen (NORCO/VICODIN) 5-325 MG tablet.

## 2017-06-15 NOTE — Telephone Encounter (Signed)
The hydrocodone is not due until 06/23/2017.

## 2017-06-16 ENCOUNTER — Other Ambulatory Visit: Payer: Self-pay | Admitting: Neurosurgery

## 2017-06-16 NOTE — Telephone Encounter (Signed)
Pt's aunt called said he will run out of medication on Sunday. She gives him 2/day. She is aware the clinic is closed tomorrow. Please call to discuss an early refill

## 2017-06-16 NOTE — Telephone Encounter (Signed)
Called [facility]. Made appt for 06/28/17 at 12pm, check in 1130am. She verbalized understanding and appreciation for call.

## 2017-06-16 NOTE — Telephone Encounter (Signed)
I called his aunt. Patient is having ongoing pain, he is having evaluation through Dr. Christella Noa, he is to have a myelogram in the near future. The patient however is also having some double vision, gait instability, he is losing cognitive functioning over the last several weeks, we will try to get a work in revisit for him.  The patient is getting too hydrocodone daily. We will refill his hydrocodone for 60 tablets on 06/20/2017.

## 2017-06-20 ENCOUNTER — Telehealth: Payer: Self-pay | Admitting: Neurology

## 2017-06-20 MED ORDER — HYDROCODONE-ACETAMINOPHEN 5-325 MG PO TABS: 1.0000 | ORAL_TABLET | Freq: Four times a day (QID) | ORAL | 0 refills | Status: DC | PRN

## 2017-06-20 NOTE — Telephone Encounter (Signed)
Placed printed/signed rx up front for patient pick up.  

## 2017-06-20 NOTE — Telephone Encounter (Signed)
The hydrocodone prescription will be refilled. 

## 2017-06-20 NOTE — Telephone Encounter (Signed)
Pt's aunt called reg refill for HYDROcodone-acetaminophen (NORCO/VICODIN) 5-325 MG tablet . Please call her when it is ready. She is requesting to pick it up today

## 2017-06-20 NOTE — Addendum Note (Signed)
Addended by: Kathrynn Ducking on: 06/20/2017 04:29 PM   Modules accepted: Orders

## 2017-06-28 ENCOUNTER — Encounter: Payer: Self-pay | Admitting: Neurology

## 2017-06-28 ENCOUNTER — Ambulatory Visit (INDEPENDENT_AMBULATORY_CARE_PROVIDER_SITE_OTHER): Payer: Medicare Other | Admitting: Neurology

## 2017-06-28 VITALS — BP 116/86 | HR 94 | Ht 66.0 in | Wt 207.5 lb

## 2017-06-28 DIAGNOSIS — M791 Myalgia, unspecified site: Secondary | ICD-10-CM

## 2017-06-28 DIAGNOSIS — R569 Unspecified convulsions: Secondary | ICD-10-CM

## 2017-06-28 MED ORDER — LAMOTRIGINE 25 MG PO TABS: 75.0000 mg | ORAL_TABLET | Freq: Two times a day (BID) | ORAL | 1 refills | Status: DC

## 2017-06-28 NOTE — Progress Notes (Signed)
Reason for visit: Seizures  [name] Love is an 46 y.o. male  History of present illness:  [name] Love is a 46 year old right-handed white male with a history of an ependymoma resection and a VP shunt placement. The patient has developed diffuse neuromuscular pain that began in June 2018. The patient started out with shoulder pain that has now spread to include the entire body including the legs. The patient has a chronic gait disorder, it is not clear that there has been any change in his ability to walk, he uses a cane for ambulation. There have been no falls since last seen. There have been no changes in muscle strength. Blood work has been unremarkable, the patient is followed by Dr. Christella Noa from neurosurgery and he will have a total myelogram tomorrow because of the pain. The patient has had MRI of the brain done at [facility] in June 2018, the results of this study are not available to me. The patient has also been followed through psychiatry. The patient claims that he feels isolated, he has cognitive impairment and hearing impairment and a gait disorder, he is not able to interact with the world well. He has developed problems with paranoia and with hallucinations. The patient may see people in the house, he has a high level anxiety when he is left alone. He sleeps throughout the day, getting up around 4 PM and then he will sleep most of the night. The patient is on hydrocodone for pain and he takes a high level of ibuprofen up to 800 mg 3 or 4 times a day. This does not completely control the pain. He is having some itching on the hydrocodone. He returns for an evaluation. Cognitively, the patient has lost ground, he is no longer able to manage his own medications. He lives with his aunt, [name] Love.  Past Medical History:  Diagnosis Date  . Brain cancer ([place])   . Seizures ([place])     Past Surgical History:  Procedure Laterality Date  . BRAIN SURGERY    . HERNIA REPAIR    .  SHUNT REVISION      Family History  Problem Relation Age of Onset  . Brain cancer Mother   . High blood pressure Mother     Social history:  reports that he has never smoked. He has never used smokeless tobacco. He reports that he does not drink alcohol or use drugs.    Allergies  Allergen Reactions  . Sulfa Antibiotics   . Contrast Media [Iodinated Diagnostic Agents] Rash    Medications:  Prior to Admission medications   Medication Sig Start Date End Date Taking? Authorizing Provider  ALPRAZolam (XANAX) 0.25 MG tablet Take 0.25 mg by mouth daily. 1 tablet in the morning and 2 tablets at bedtime 01/13/17  Yes [provider]  baclofen (LIORESAL) 10 MG tablet Take 1.5 tablets (15 mg total) by mouth 3 (three) times daily. Patient taking differently: Take 10-15 mg by mouth 2 (two) times daily. 10 mg at 8 am, and 15 mg at 8pm 04/08/17  Yes Sater, Nanine Means, MD  escitalopram (LEXAPRO) 20 MG tablet Take 20 mg by mouth daily.  12/31/16  Yes [provider]  HYDROcodone-acetaminophen (NORCO/VICODIN) 5-325 MG tablet Take 1 tablet by mouth every 6 (six) hours as needed for moderate pain. Must last 28 days 06/20/17  Yes Kathrynn Ducking, MD  ibuprofen (ADVIL,MOTRIN) 200 MG tablet Take 800 mg by mouth every 6 (six) hours as needed  for mild pain or moderate pain.   Yes [provider]  levETIRAcetam (KEPPRA) 1000 MG tablet Take 1 tablet (1,000 mg total) by mouth 2 (two) times daily. 04/25/17  Yes Kathrynn Ducking, MD  levothyroxine (SYNTHROID, LEVOTHROID) 50 MCG tablet Take 50 mcg by mouth daily before breakfast.  12/25/16  Yes [provider]  Multiple Vitamins-Minerals (MULTIVITAMIN WITH MINERALS) tablet Take 1 tablet by mouth daily.   Yes [provider]  NUEDEXTA 20-10 MG CAPS Take 1 capsule by mouth 2 (two) times daily. 12/31/16  Yes [provider]  polycarbophil (FIBERCON) 625 MG tablet Take 1,250 mg by mouth at bedtime.   Yes [provider]  risperiDONE (RISPERDAL) 0.5 MG tablet Take 0.5 mg by mouth at bedtime. 01/19/17  Yes [provider]  topiramate (TOPAMAX) 50 MG tablet One tablet in the morning and two in the evening 01/20/17  Yes Kathrynn Ducking, MD  vitamin C (ASCORBIC ACID) 500 MG tablet Take 500 mg by mouth daily.   Yes [provider]    ROS:  Out of a complete 14 system review of symptoms, the patient complains only of the following symptoms, and all other reviewed systems are negative.  Neuromuscular pain Memory disorder Gait disorder  Blood pressure 116/86, pulse 94, height 5\' 6"  (1.676 m), weight 207 lb 8 oz (94.1 kg).  Physical Exam  General: The patient is alert and cooperative at the time of the examination.  Skin: No significant peripheral edema is noted.   Neurologic Exam  Mental status: The patient is alert and oriented x 3 at the time of the examination. The patient has apparent normal recent and remote memory, with an apparently normal attention span and concentration ability.   Cranial nerves: Facial symmetry is present. Speech is normal, no aphasia or dysarthria is noted. Extraocular movements are full. Visual fields are full. The patient is hard of hearing.  Motor: The patient has good strength in all 4 extremities.  Sensory examination: Soft touch sensation is symmetric on the face, but it is somewhat decreased on the left arm and right leg.  Coordination: The patient has good finger-nose-finger and heel-to-shin bilaterally.  Gait and station: The patient has a wide-based, unsteady gait. The patient is unable to perform tandem gait. Romberg is positive, the patient falls backwards.  Reflexes: Deep tendon reflexes are symmetric.   Assessment/Plan:  1. History of ependymoma, VP shunt placement  2. Total body neuromuscular pain, questionable fibromyalgia  3. Gait disorder  4. History of seizures  5. Depression, anxiety, paranoia,  hallucinations  Overall the patient is doing poorly. The patient was initially placed on Lamictal but he was taken off of this medication as a side effect of Lamictal is neuromuscular pain. Off the medication there has been no change in his neuromuscular pain. We will restart the Lamictal dosing, when he gets up to 100 mg twice daily, we will initiate a taper off of the Keppra. Keppra can cause excessive drowsiness during the day and can result in irritability and psychosis. The patient is having hallucinations and paranoia. The patient will be going for a total myelogram with CT to follow tomorrow. He will follow-up for his next scheduled visit in October. Given the level of ibuprofen he is taking in, we will need to check blood work on the next visit to look at liver and kidney function.  Jill Alexanders MD 06/28/2017 12:06 PM  [facility] [address] [place], [place]  [address]  Phone [phone] Fax [phone]

## 2017-06-28 NOTE — Patient Instructions (Signed)
   With the Lamictal begin 25 mg twice a day for 2 weeks, then  Take 50 mg twice a day for 2 weeks, then  Take 75 mg twice a day for 2 weeks, then  Call our office, and we will convert to 100 mg twice a day and begin the taper off of Keppra.

## 2017-06-29 ENCOUNTER — Ambulatory Visit (HOSPITAL_COMMUNITY)
Admission: RE | Admit: 2017-06-29 | Discharge: 2017-06-29 | Disposition: A | Discharge status: Home / Self Care | Payer: Medicare Other | Source: Ambulatory Visit | Attending: Neurosurgery | Admitting: Neurosurgery

## 2017-06-29 ENCOUNTER — Ambulatory Visit (HOSPITAL_COMMUNITY): Admission: RE | Admit: 2017-06-29 | Payer: Medicare Other | Source: Ambulatory Visit

## 2017-06-29 ENCOUNTER — Other Ambulatory Visit (HOSPITAL_COMMUNITY): Payer: Self-pay | Admitting: Neurosurgery

## 2017-06-29 DIAGNOSIS — M5116 Intervertebral disc disorders with radiculopathy, lumbar region: Secondary | ICD-10-CM | POA: Diagnosis not present

## 2017-06-29 DIAGNOSIS — M5440 Lumbago with sciatica, unspecified side: Secondary | ICD-10-CM

## 2017-06-29 MED ORDER — LIDOCAINE HCL (PF) 1 % IJ SOLN
5.0000 mL | Freq: Once | INTRAMUSCULAR | Status: AC
  Administered 2017-06-29: 5 mL via INTRADERMAL

## 2017-06-29 MED ORDER — IOPAMIDOL (ISOVUE-M 200) INJECTION 41%
20.0000 mL | Freq: Once | INTRAMUSCULAR | Status: AC
  Administered 2017-06-29: 2 mL via INTRATHECAL

## 2017-06-29 MED ORDER — ONDANSETRON HCL 4 MG/2ML IJ SOLN: 4.0000 mg | Freq: Four times a day (QID) | INTRAMUSCULAR | Status: DC | PRN

## 2017-06-29 MED ORDER — DIAZEPAM 5 MG PO TABS
10.0000 mg | ORAL_TABLET | Freq: Once | ORAL | Status: AC
  Administered 2017-06-29: 10 mg via ORAL
  Filled 2017-06-29: qty 2

## 2017-06-29 MED ORDER — DIAZEPAM 5 MG PO TABS
ORAL_TABLET | ORAL | Status: AC
  Administered 2017-06-29: 10 mg via ORAL
  Filled 2017-06-29: qty 2

## 2017-06-29 NOTE — Progress Notes (Signed)
Pt reports that he took allergy premeds at home as follows: Prednisone 06/28/17 @ 2030, 06/29/17 @ 0130, 06/29/17 @ 0830. And Benadry 50mg  06/29/17 @ 0830.

## 2017-06-30 ENCOUNTER — Telehealth: Payer: Self-pay | Admitting: Neurology

## 2017-06-30 DIAGNOSIS — F064 Anxiety disorder due to known physiological condition: Secondary | ICD-10-CM | POA: Diagnosis not present

## 2017-06-30 DIAGNOSIS — F482 Pseudobulbar affect: Secondary | ICD-10-CM | POA: Diagnosis not present

## 2017-06-30 DIAGNOSIS — F0634 Mood disorder due to known physiological condition with mixed features: Secondary | ICD-10-CM | POA: Diagnosis not present

## 2017-06-30 NOTE — Telephone Encounter (Signed)
I called and talked with [name] Love. The myelogram could not be done, spinal fluid could not be accessed.  There may need to do a MRI study on the patient. He does have a VP shunt in place, this will need to be reprogrammed afterwards.  [name] Love is following the patient.

## 2017-06-30 NOTE — Telephone Encounter (Signed)
Pt aunt(on DPR)calling to inform that pt had a mylagram  On yesterday but they were unable to complete because pt has no spinal fluid.  Please call

## 2017-07-08 NOTE — Telephone Encounter (Signed)
ERROR

## 2017-07-18 ENCOUNTER — Telehealth: Payer: Self-pay | Admitting: Neurology

## 2017-07-18 MED ORDER — HYDROCODONE-ACETAMINOPHEN 5-325 MG PO TABS: 1.0000 | ORAL_TABLET | Freq: Four times a day (QID) | ORAL | 0 refills | Status: DC | PRN

## 2017-07-18 NOTE — Telephone Encounter (Signed)
Pt's aunt request refill for HYDROcodone-acetaminophen (NORCO/VICODIN) 5-325 MG tablet. Pt's aunt said he 1 tablet left, directions say it must last 28 days.

## 2017-07-18 NOTE — Telephone Encounter (Signed)
The prescription for hydrocodone will be refilled. 

## 2017-07-19 NOTE — Telephone Encounter (Signed)
Placed printed/signed rx up front for patient pick up.  

## 2017-07-21 DIAGNOSIS — M5440 Lumbago with sciatica, unspecified side: Secondary | ICD-10-CM | POA: Diagnosis not present

## 2017-07-21 DIAGNOSIS — Z6834 Body mass index (BMI) 34.0-34.9, adult: Secondary | ICD-10-CM | POA: Diagnosis not present

## 2017-07-21 DIAGNOSIS — R030 Elevated blood-pressure reading, without diagnosis of hypertension: Secondary | ICD-10-CM | POA: Diagnosis not present

## 2017-07-21 DIAGNOSIS — D332 Benign neoplasm of brain, unspecified: Secondary | ICD-10-CM | POA: Diagnosis not present

## 2017-07-21 DIAGNOSIS — M5116 Intervertebral disc disorders with radiculopathy, lumbar region: Secondary | ICD-10-CM | POA: Diagnosis not present

## 2017-07-25 ENCOUNTER — Ambulatory Visit: Payer: Medicare Other | Admitting: Adult Health

## 2017-07-27 ENCOUNTER — Other Ambulatory Visit: Payer: Self-pay | Admitting: Neurology

## 2017-07-27 DIAGNOSIS — M791 Myalgia, unspecified site: Secondary | ICD-10-CM

## 2017-08-02 DIAGNOSIS — F064 Anxiety disorder due to known physiological condition: Secondary | ICD-10-CM | POA: Diagnosis not present

## 2017-08-02 DIAGNOSIS — F0634 Mood disorder due to known physiological condition with mixed features: Secondary | ICD-10-CM | POA: Diagnosis not present

## 2017-08-02 DIAGNOSIS — F482 Pseudobulbar affect: Secondary | ICD-10-CM | POA: Diagnosis not present

## 2017-08-08 ENCOUNTER — Telehealth: Payer: Self-pay | Admitting: Neurology

## 2017-08-08 MED ORDER — LAMOTRIGINE 100 MG PO TABS: 100.0000 mg | ORAL_TABLET | Freq: Two times a day (BID) | ORAL | 2 refills | Status: DC

## 2017-08-08 NOTE — Telephone Encounter (Signed)
[name] Love is calling to advise last night the patient started Lamictal 100mg . Please call to discuss since after starting that dosage he is to taper off Keppra.

## 2017-08-08 NOTE — Addendum Note (Signed)
Addended by: Kathrynn Ducking on: 08/08/2017 05:10 PM   Modules accepted: Orders

## 2017-08-08 NOTE — Telephone Encounter (Signed)
I called Hassan Rowan concerning the patient, he is now on Lamictal 100 mg twice daily, I will call in the 100 mg tablets.  He will taper off of the Keppra by 500 mg every 2 weeks until off the medication.  The patient is having increased confusion, visual hallucinations are occurring.  He is being followed through psychiatry.  He will be in our office this week for EMG and nerve conduction study evaluation.

## 2017-08-10 ENCOUNTER — Encounter: Payer: Self-pay | Admitting: Neurology

## 2017-08-10 ENCOUNTER — Ambulatory Visit: Payer: Medicare Other | Admitting: Neurology

## 2017-08-10 DIAGNOSIS — M791 Myalgia, unspecified site: Secondary | ICD-10-CM

## 2017-08-10 NOTE — Telephone Encounter (Signed)
I called [name] Love.  The EMG nerve conduction study done today was normal.  I have set up him for further blood work, looking for other things that could cause severe back pain and muscle cramps such as stiff person syndrome.  We may consider treatment with other medications in the future such as Lyrica or gabapentin.

## 2017-08-10 NOTE — Procedures (Signed)
     HISTORY:  [name] Love is a 46 year old gentleman with a history of onset of diffuse pain in the arms and legs, shoulder and back that developed in June 2018.  The patient is being evaluated for a possible etiology for his diffuse pain.  He reports muscle spasms as well.  NERVE CONDUCTION STUDIES:  Nerve conduction studies were performed on the left upper extremity. The distal motor latencies and motor amplitudes for the median and ulnar nerves were within normal limits. The F wave latencies and nerve conduction velocities for these nerves were also normal. The sensory latencies for the median and ulnar nerves were normal.  Nerve conduction studies were performed on both lower extremities. The distal motor latencies and motor amplitudes for the peroneal and posterior tibial nerves were within normal limits. The nerve conduction velocities for these nerves were also normal. The H reflex latencies were normal. The sensory latencies for the peroneal nerves were within normal limits.   EMG STUDIES:  EMG study was performed on the left lower extremity:  The tibialis anterior muscle reveals 2 to 4K motor units with full recruitment. No fibrillations or positive waves were seen. The peroneus tertius muscle reveals 2 to 4K motor units with full recruitment. No fibrillations or positive waves were seen. The medial gastrocnemius muscle reveals 1 to 3K motor units with full recruitment. No fibrillations or positive waves were seen. The vastus lateralis muscle reveals 2 to 4K motor units with full recruitment. No fibrillations or positive waves were seen. The iliopsoas muscle reveals 2 to 4K motor units with full recruitment. No fibrillations or positive waves were seen. The biceps femoris muscle (long head) reveals 2 to 4K motor units with full recruitment. No fibrillations or positive waves were seen. The lumbosacral paraspinal muscles were tested at 3 levels, and revealed no abnormalities of  insertional activity at all 3 levels tested. There was good relaxation.   IMPRESSION:  Nerve conduction studies done on the left upper extremity and both lower extremities were within normal limits.  No evidence of a neuropathy is seen.  EMG evaluation of the left lower extremity is unremarkable without evidence of a myopathic disorder or an overlying lumbosacral radiculopathy.  Jill Alexanders MD 08/10/2017 2:09 PM  [facility] [address] [place], [place] [address]  Phone [phone] Fax [phone]

## 2017-08-10 NOTE — Telephone Encounter (Signed)
Pt's aunt (on [place]) calling to inform that she will not be bringing pt to appointment.  She has someone else bringing him.  Pt aunt is asking to be called with the results of the testing

## 2017-08-10 NOTE — Progress Notes (Addendum)
Patient comes in for EMG and nerve conduction study evaluation today.  This study is completely normal, nerve conductions of the left arm and both legs and EMG of the left leg showed no etiology of the diffuse muscle pain.  Patient was sent for further blood work.  We will look for stiff person syndrome.  We may consider use of diazepam or gabapentin or Lyrica in the future.  The patient also reports of restless legs type syndrome.   [place]    Nerve / Sites Muscle Latency Ref. Amplitude Ref. Rel Amp Segments Distance Velocity Ref. Area    ms ms mV mV %  cm m/s m/s mVms  L Median - APB     Wrist APB 2.8 ?4.4 11.7 ?4.0 100 Wrist - APB 7   42.4     Upper arm APB 6.7  10.9  92.8 Upper arm - Wrist 23 58 ?49 38.5  L Ulnar - ADM     Wrist ADM 2.1 ?3.3 7.9 ?6.0 100 Wrist - ADM 7   20.3     B.Elbow ADM 6.1  8.3  105 B.Elbow - Wrist 22 54 ?49 22.1     A.Elbow ADM 7.8  8.0  96 A.Elbow - B.Elbow 10 62 ?49 19.2         A.Elbow - Wrist      L Peroneal - EDB     Ankle EDB 3.0 ?6.5 4.7 ?2.0 100 Ankle - EDB 9   16.4     Fib head EDB 10.6  3.6  76.4 Fib head - Ankle 38 50 ?44 14.7     Pop fossa EDB 12.5  3.0  82.6 Pop fossa - Fib head 10 52 ?44 12.5         Pop fossa - Ankle      R Peroneal - EDB     Ankle EDB 3.1 ?6.5 5.5 ?2.0 100 Ankle - EDB 9   20.6     Fib head EDB 10.9  4.5  82.9 Fib head - Ankle 38 48 ?44 17.6     Pop fossa EDB 12.9  4.2  91.8 Pop fossa - Fib head 10 51 ?44 16.5         Pop fossa - Ankle      L Tibial - AH     Ankle AH 2.8 ?5.8 13.5 ?4.0 100 Ankle - AH 9   25.6     Pop fossa AH 12.6  3.7  27.5 Pop fossa - Ankle 46 47 ?41 9.8  R Tibial - AH     Ankle AH 3.3 ?5.8 19.0 ?4.0 100 Ankle - AH 9   34.7     Pop fossa AH 13.9  5.2  27.4 Pop fossa - Ankle 45 43 ?41 16.9                 SNC    Nerve / Sites Rec. Site Peak Lat Amp Segments Distance    ms V  cm  L Median - Orthodromic (Dig II, Mid palm)     Dig II Wrist 2.9 32 Dig II - Wrist 13  L Ulnar - Orthodromic, (Dig V, Mid palm)      Dig V Wrist 2.8 29 Dig V - Wrist 11         SNC    Nerve / Sites Rec. Site Peak Lat Ref.  Amp Ref. Segments Distance    ms ms V V  cm  R Superficial peroneal -  Ankle     Lat leg Ankle 3.0 ?4.4 11 ?6 Lat leg - Ankle 14  L Superficial peroneal - Ankle     Lat leg Ankle 2.9 ?4.4 9 ?6 Lat leg - Ankle 14         F  Wave    Nerve F Lat Ref.   ms ms  L Median - APB 26.4 ?31.0  L Ulnar - ADM 27.3 ?32.0         H Reflex    Nerve H Lat Lat Hmax   ms ms   Left Right Ref. Left Right Ref.  Tibial - [place] 31.3 31.1 ?35.0 24.0 32.1 ?35.0

## 2017-08-10 NOTE — Progress Notes (Signed)
Please refer to EMG and nerve conduction study procedure note. 

## 2017-08-12 ENCOUNTER — Telehealth: Payer: Self-pay | Admitting: Neurology

## 2017-08-12 LAB — GLUTAMIC ACID DECARBOXYLASE AUTO ABS: Glutamic Acid Decarb Ab: <5 U/mL (ref 0.0–5.0)

## 2017-08-12 LAB — HIV ANTIBODY (ROUTINE TESTING W REFLEX): HIV SCREEN 4TH GENERATION: NONREACTIVE

## 2017-08-12 LAB — EBV, CHRONIC/ACTIVE INFECTION
EBV Early Antigen Ab, IgG: <9 U/mL (ref 0.0–8.9)
EBV VCA IgG: >600 U/mL — ABNORMAL HIGH (ref 0.0–17.9)

## 2017-08-12 LAB — PAN-ANCA
Atypical pANCA: <1:20 {titer}
Myeloperoxidase Ab: <9 U/mL (ref 0.0–9.0)
P-ANCA: <1:20 {titer}

## 2017-08-12 MED ORDER — GABAPENTIN 300 MG PO CAPS: ORAL_CAPSULE | ORAL | 3 refills | Status: DC

## 2017-08-12 NOTE — Telephone Encounter (Signed)
I called [name] Love.  The blood work is unremarkable, we will start gabapentin for discomfort, he will start 300 mg twice daily for 1 week and then go to 300 mg in the morning and 600 mg in the evening.

## 2017-08-18 ENCOUNTER — Telehealth: Payer: Self-pay | Admitting: Neurology

## 2017-08-18 MED ORDER — HYDROCODONE-ACETAMINOPHEN 5-325 MG PO TABS: 1.0000 | ORAL_TABLET | Freq: Four times a day (QID) | ORAL | 0 refills | Status: DC | PRN

## 2017-08-18 NOTE — Addendum Note (Signed)
Addended by: Kathrynn Ducking on: 08/18/2017 10:08 AM   Modules accepted: Orders

## 2017-08-18 NOTE — Telephone Encounter (Signed)
Pt aunt(on [place]) calling for a refill of HYDROcodone-acetaminophen (NORCO/VICODIN) 5-325 MG tablet

## 2017-08-18 NOTE — Telephone Encounter (Signed)
The hydrocodone will be refilled. 

## 2017-08-18 NOTE — Telephone Encounter (Signed)
Placed printed/signed rx up front for patient pick up.  

## 2017-08-22 ENCOUNTER — Other Ambulatory Visit: Payer: Self-pay | Admitting: Neurology

## 2017-08-23 ENCOUNTER — Ambulatory Visit: Payer: Medicare Other | Admitting: Neurology

## 2017-08-23 ENCOUNTER — Encounter (INDEPENDENT_AMBULATORY_CARE_PROVIDER_SITE_OTHER): Payer: Self-pay

## 2017-08-23 ENCOUNTER — Encounter: Payer: Self-pay | Admitting: Neurology

## 2017-08-23 VITALS — BP 117/75 | HR 97 | Ht 66.0 in | Wt 222.0 lb

## 2017-08-23 DIAGNOSIS — H539 Unspecified visual disturbance: Secondary | ICD-10-CM | POA: Diagnosis not present

## 2017-08-23 DIAGNOSIS — R269 Unspecified abnormalities of gait and mobility: Secondary | ICD-10-CM

## 2017-08-23 DIAGNOSIS — R569 Unspecified convulsions: Secondary | ICD-10-CM

## 2017-08-23 DIAGNOSIS — M791 Myalgia, unspecified site: Secondary | ICD-10-CM

## 2017-08-23 DIAGNOSIS — G3281 Cerebellar ataxia in diseases classified elsewhere: Secondary | ICD-10-CM | POA: Diagnosis not present

## 2017-08-23 DIAGNOSIS — Z5181 Encounter for therapeutic drug level monitoring: Secondary | ICD-10-CM

## 2017-08-23 MED ORDER — TOPIRAMATE 50 MG PO TABS: ORAL_TABLET | ORAL | 5 refills | Status: DC

## 2017-08-23 NOTE — Patient Instructions (Signed)
   We will get MRI of the brain. We will go to gabapentin 300 mg twice a day for 5 days, one a day for 5 days, then stop.  We will get physical therapy out to the house.

## 2017-08-23 NOTE — Progress Notes (Signed)
Reason for visit: Seizures  [name] Love is an 46 y.o. male  History of present illness:  [name] Love is a 46 year old right-handed white male with a history of a brain tumor, the patient has had whole brain radiation.  He has developed a gradually progressive problem with memory and confusion, hallucinations, and a gait disorder.  The patient has developed a total body pain syndrome with pain that is worse in the low back and down the legs that started with pain in the shoulder on the right in June 2018.  The patient is on hydrocodone, he takes ibuprofen on a regular basis.  He has been placed on risperidone and then on Seroquel for his confusion and hallucinations.  He is now off of these medications.  The patient was placed on gabapentin for pain, but since being on the medication his walking has worsened, he has had several falls, he is now using a walker instead of a cane.  The patient claims that his legs will "give out".  He has a tendency to go forward and cannot stop himself.  The patient has had worsening problems with confusion.  The patient returns to this office for an evaluation.  Recent EMG and nerve conduction study did not show any abnormalities.  He has had MRI evaluation through Dr. Christella Noa of the spine, no surgically amenable problems were noted.  Past Medical History:  Diagnosis Date  . Brain cancer ([place])   . Seizures ([place])     Past Surgical History:  Procedure Laterality Date  . BRAIN SURGERY    . HERNIA REPAIR    . SHUNT REVISION      Family History  Problem Relation Age of Onset  . Brain cancer Mother   . High blood pressure Mother     Social history:  reports that  has never smoked. he has never used smokeless tobacco. He reports that he does not drink alcohol or use drugs.    Allergies  Allergen Reactions  . Baclofen     Urinary retension  . Sulfa Antibiotics   . Contrast Media [Iodinated Diagnostic Agents] Rash    Medications:  Prior to Admission  medications   Medication Sig Start Date End Date Taking? Authorizing Provider  ALPRAZolam (XANAX) 0.25 MG tablet Take 0.25 mg by mouth daily. 1 tablet in the morning and 2 tablets at bedtime 01/13/17  Yes [provider]  escitalopram (LEXAPRO) 20 MG tablet Take 20 mg by mouth daily.  12/31/16  Yes [provider]  gabapentin (NEURONTIN) 300 MG capsule 1 capsule in the morning and 2 in the evening 08/12/17  Yes Kathrynn Ducking, MD  HYDROcodone-acetaminophen (NORCO/VICODIN) 5-325 MG tablet Take 1 tablet every 6 (six) hours as needed by mouth for moderate pain. Must last 28 days 08/18/17  Yes Kathrynn Ducking, MD  ibuprofen (ADVIL,MOTRIN) 200 MG tablet Take 800 mg by mouth every 6 (six) hours as needed for mild pain or moderate pain.   Yes [provider]  lamoTRIgine (LAMICTAL) 100 MG tablet Take 1 tablet (100 mg total) 2 (two) times daily by mouth. 08/08/17  Yes Kathrynn Ducking, MD  levothyroxine (SYNTHROID, LEVOTHROID) 50 MCG tablet Take 50 mcg by mouth daily before breakfast.  12/25/16  Yes [provider]  Multiple Vitamins-Minerals (MULTIVITAMIN WITH MINERALS) tablet Take 1 tablet by mouth daily.   Yes [provider]  NUEDEXTA 20-10 MG CAPS Take 1 capsule by mouth 2 (two) times daily. 12/31/16  Yes [provider]  polycarbophil (FIBERCON) 625 MG tablet Take 1,250 mg by mouth at bedtime.   Yes [provider]  risperiDONE (RISPERDAL) 0.5 MG tablet Take 0.5 mg by mouth at bedtime. 01/19/17  Yes [provider]  topiramate (TOPAMAX) 50 MG tablet One tablet in the morning and two in the evening 01/20/17  Yes Kathrynn Ducking, MD  vitamin C (ASCORBIC ACID) 500 MG tablet Take 500 mg by mouth daily.   Yes [provider]    ROS:  Out of a complete 14 system review of symptoms, the patient complains only of the following symptoms, and all other reviewed systems are negative.  Fatigue Hearing loss Blurred vision,  shortness of breath Excessive thirst, excessive eating Restless legs, sleep talking Joint pain, back pain, aching muscles, walking difficulty, neck pain, neck stiffness Memory loss, dizziness, headache, seizures, weakness Agitation, confusion, depression, anxiety  Blood pressure 117/75, pulse 97, height 5\' 6"  (1.676 m), weight 222 lb (100.7 kg).  Physical Exam  General: The patient is alert and cooperative at the time of the examination.  The patient is moderately obese.  Skin: 1+ edema at the ankles is noted..   Neurologic Exam  Mental status: The patient is alert and oriented x 3 at the time of the examination. The patient has apparent normal recent and remote memory, with an apparently normal attention span and concentration ability.   Cranial nerves: Facial symmetry is present. Speech is normal, no aphasia or dysarthria is noted. Extraocular movements are full. Visual fields are notable for a left homonymous visual field deficit that is relative, particular involving the left lower quadrant.  Patient has a flat affect.  Motor: The patient has good strength in all 4 extremities.  Sensory examination: Soft touch sensation is symmetric on the face and arms, slightly decreased on the right leg as compared to the left.  Coordination: The patient has good finger-nose-finger and heel-to-shin bilaterally.  Gait and station: The patient has a wide-based, unsteady gait.  The patient walks with a walker.  Tandem gait was not attempted.  Romberg is positive, the patient falls backwards.  Reflexes: Deep tendon reflexes are symmetric.   Assessment/Plan:  1.  History of seizures  2.  Gait disturbance, falls  3.  Left sided visual field deficit  4.  Diffuse neuromuscular discomfort  The patient has done well with seizure control, he has not had any recurrent seizure events.  He will remain on Lamictal and Topamax.  Blood work will be done today.  He continues to have significant  diffuse pain, he may have to go up on the hydrocodone taking 3 a day.  The patient appears to have a new visual field deficit on clinical examination today, he will be sent for MRI of the brain.  He will be set up for physical therapy in the home environment for his gait disturbance.  He will be tapered off of the gabapentin as this may be worsening his ability to ambulate.  He will follow-up in 4 months.  Jill Alexanders MD 08/23/2017 10:28 AM  [facility] [address] [place], [place] [address]  Phone [phone] Fax [phone]

## 2017-08-24 ENCOUNTER — Telehealth: Payer: Self-pay | Admitting: *Deleted

## 2017-08-24 LAB — COMPREHENSIVE METABOLIC PANEL
A/G RATIO: 2.3 — AB (ref 1.2–2.2)
ALT: 19 IU/L (ref 0–44)
AST: 18 IU/L (ref 0–40)
Albumin: 4.5 g/dL (ref 3.5–5.5)
Alkaline Phosphatase: 88 IU/L (ref 39–117)
BUN / CREAT RATIO: 14 (ref 9–20)
BUN: 17 mg/dL (ref 6–24)
Bilirubin Total: <0.2 mg/dL (ref 0.0–1.2)
CALCIUM: 9 mg/dL (ref 8.7–10.2)
CO2: 21 mmol/L (ref 20–29)
Chloride: 106 mmol/L (ref 96–106)
Creatinine, Ser: 1.2 mg/dL (ref 0.76–1.27)
GFR, EST AFRICAN AMERICAN: 85 mL/min/{1.73_m2} (ref >59)
GFR, EST NON AFRICAN AMERICAN: 73 mL/min/{1.73_m2} (ref >59)
Globulin, Total: 2 g/dL (ref 1.5–4.5)
Glucose: 78 mg/dL (ref 65–99)
POTASSIUM: 4.6 mmol/L (ref 3.5–5.2)
SODIUM: 142 mmol/L (ref 134–144)
TOTAL PROTEIN: 6.5 g/dL (ref 6.0–8.5)

## 2017-08-24 LAB — CBC WITH DIFFERENTIAL/PLATELET
BASOS: 1 %
Basophils Absolute: 0 10*3/uL (ref 0.0–0.2)
EOS (ABSOLUTE): 0.3 10*3/uL (ref 0.0–0.4)
Eos: 4 %
HEMATOCRIT: 39.9 % (ref 37.5–51.0)
Hemoglobin: 13.6 g/dL (ref 13.0–17.7)
Immature Grans (Abs): 0 10*3/uL (ref 0.0–0.1)
Immature Granulocytes: 1 %
LYMPHS ABS: 2 10*3/uL (ref 0.7–3.1)
Lymphs: 27 %
MCH: 30.8 pg (ref 26.6–33.0)
MCHC: 34.1 g/dL (ref 31.5–35.7)
MCV: 90 fL (ref 79–97)
MONOS ABS: 0.6 10*3/uL (ref 0.1–0.9)
Monocytes: 8 %
NEUTROS ABS: 4.4 10*3/uL (ref 1.4–7.0)
Neutrophils: 59 %
PLATELETS: 264 10*3/uL (ref 150–379)
RBC: 4.42 x10E6/uL (ref 4.14–5.80)
RDW: 13.6 % (ref 12.3–15.4)
WBC: 7.3 10*3/uL (ref 3.4–10.8)

## 2017-08-24 NOTE — Telephone Encounter (Signed)
Called and spoke with Hassan Rowan (on [place]) about unremarkable labs per CW,MD note.   She had a question about MRI that was ordered. She thought Dr Jannifer Franklin was going to order a CT head.  She said that pt just had MRI brain on 03/29/17 ordered by Dr Christella Noa. Wondering if he needs to have another MRI or if she needs to just get a copy from Dr Christella Noa and bring it to Dr Jannifer Franklin for review?  She said he also had MRI of his whole back about a month ago. Advised I will have to send message to Dr Jannifer Franklin to advise. We will call back to let her know.

## 2017-08-24 NOTE — Telephone Encounter (Signed)
I called the patient left message, the MRI is being ordered because of a new field change that was noted on the clinical examination yesterday.  He was seen in September 2018, a visual field disturbance was not noted at that time.  Even though the MRI was done in June 2018, these changes have occurred since that time.

## 2017-08-24 NOTE — Telephone Encounter (Signed)
-----   Message from Kathrynn Ducking, MD sent at 08/24/2017  8:17 AM EST -----   The blood work results are unremarkable. Please call the patient.  ----- Message ----- From: Lavone Neri Lab Results In Sent: 08/24/2017   7:44 AM To: Kathrynn Ducking, MD

## 2017-08-29 NOTE — Telephone Encounter (Signed)
I called the office of Dr. Christella Noa.  I need the information regarding the VP shunt with the serial number and settings, this is required prior to the MRI study to be done.  They indicated they would fax over the information.

## 2017-08-29 NOTE — Telephone Encounter (Signed)
Pt Aunt(on [facility]) has called and is stating that re: the MRI that is being requested by Dr Jannifer Franklin each time one is done  Pt's shunt has to be readjusted and in order to do that there is information about the shunt that is needed that pt's Aunt says she no longer has since pt has had shunt since he has been 20, she is asking for a returned call from Dr Jannifer Franklin when he becomes available

## 2017-09-15 ENCOUNTER — Telehealth: Payer: Self-pay | Admitting: Neurology

## 2017-09-15 MED ORDER — HYDROCODONE-ACETAMINOPHEN 5-325 MG PO TABS: 1.0000 | ORAL_TABLET | Freq: Four times a day (QID) | ORAL | 0 refills | Status: DC | PRN

## 2017-09-15 NOTE — Telephone Encounter (Signed)
Pt's aunt request refill for HYDROcodone-acetaminophen (NORCO/VICODIN) 5-325 MG tablet. She is aware the clinic closes at noon tomorrow

## 2017-09-15 NOTE — Telephone Encounter (Signed)
Pt's aunt called inquiring if the information reg the shunt has been sent. Please call to advise

## 2017-09-15 NOTE — Telephone Encounter (Signed)
Dr. Jannifer Franklin- did you get a fax with the shunt information from them?

## 2017-09-15 NOTE — Telephone Encounter (Signed)
I have not seen in information regarding the shunt.  I will call the office again tomorrow.

## 2017-09-15 NOTE — Telephone Encounter (Signed)
The hydrocodone will be refilled. 

## 2017-09-16 NOTE — Telephone Encounter (Signed)
I called the office of Dr. Christella Noa.  I need the information regarding the VP shunt.  They will fax over this information.

## 2017-09-28 ENCOUNTER — Telehealth: Payer: Self-pay | Admitting: Neurology

## 2017-09-28 NOTE — Telephone Encounter (Signed)
I called and requested the information concerning the VP shunt.  The patient had the shunt placed on 16 October 2003.  This was done by Dr. Alinda Dooms at [place].  This is a Oncologist shunt.  The pressure was set at 150 mm of water.  The serial number on the shunt is not provided.

## 2017-09-29 DIAGNOSIS — H905 Unspecified sensorineural hearing loss: Secondary | ICD-10-CM | POA: Diagnosis not present

## 2017-09-29 DIAGNOSIS — Z6833 Body mass index (BMI) 33.0-33.9, adult: Secondary | ICD-10-CM | POA: Diagnosis not present

## 2017-09-29 DIAGNOSIS — E669 Obesity, unspecified: Secondary | ICD-10-CM | POA: Diagnosis not present

## 2017-09-29 DIAGNOSIS — E039 Hypothyroidism, unspecified: Secondary | ICD-10-CM | POA: Diagnosis not present

## 2017-10-03 ENCOUNTER — Emergency Department (HOSPITAL_COMMUNITY): Payer: Medicare Other

## 2017-10-03 ENCOUNTER — Encounter (HOSPITAL_COMMUNITY): Payer: Self-pay | Admitting: Emergency Medicine

## 2017-10-03 ENCOUNTER — Emergency Department (HOSPITAL_COMMUNITY)
Admission: EM | Admit: 2017-10-03 | Discharge: 2017-10-04 | Disposition: A | Discharge status: Home / Self Care | Payer: Medicare Other | Attending: Emergency Medicine | Admitting: Emergency Medicine

## 2017-10-03 ENCOUNTER — Other Ambulatory Visit: Payer: Self-pay

## 2017-10-03 DIAGNOSIS — S3992XA Unspecified injury of lower back, initial encounter: Secondary | ICD-10-CM | POA: Diagnosis not present

## 2017-10-03 DIAGNOSIS — Z79899 Other long term (current) drug therapy: Secondary | ICD-10-CM | POA: Insufficient documentation

## 2017-10-03 DIAGNOSIS — W182XXA Fall in (into) shower or empty bathtub, initial encounter: Secondary | ICD-10-CM | POA: Diagnosis not present

## 2017-10-03 DIAGNOSIS — S0083XA Contusion of other part of head, initial encounter: Secondary | ICD-10-CM

## 2017-10-03 DIAGNOSIS — R569 Unspecified convulsions: Secondary | ICD-10-CM | POA: Diagnosis not present

## 2017-10-03 DIAGNOSIS — F482 Pseudobulbar affect: Secondary | ICD-10-CM | POA: Diagnosis not present

## 2017-10-03 DIAGNOSIS — M5489 Other dorsalgia: Secondary | ICD-10-CM | POA: Diagnosis not present

## 2017-10-03 DIAGNOSIS — R52 Pain, unspecified: Secondary | ICD-10-CM | POA: Diagnosis not present

## 2017-10-03 DIAGNOSIS — F064 Anxiety disorder due to known physiological condition: Secondary | ICD-10-CM | POA: Diagnosis not present

## 2017-10-03 DIAGNOSIS — Z85841 Personal history of malignant neoplasm of brain: Secondary | ICD-10-CM | POA: Diagnosis not present

## 2017-10-03 DIAGNOSIS — S0003XA Contusion of scalp, initial encounter: Secondary | ICD-10-CM | POA: Diagnosis not present

## 2017-10-03 DIAGNOSIS — F0634 Mood disorder due to known physiological condition with mixed features: Secondary | ICD-10-CM | POA: Diagnosis not present

## 2017-10-03 DIAGNOSIS — S0990XA Unspecified injury of head, initial encounter: Secondary | ICD-10-CM | POA: Diagnosis not present

## 2017-10-03 DIAGNOSIS — Y93E1 Activity, personal bathing and showering: Secondary | ICD-10-CM | POA: Insufficient documentation

## 2017-10-03 DIAGNOSIS — S199XXA Unspecified injury of neck, initial encounter: Secondary | ICD-10-CM | POA: Diagnosis not present

## 2017-10-03 DIAGNOSIS — W19XXXA Unspecified fall, initial encounter: Secondary | ICD-10-CM

## 2017-10-03 DIAGNOSIS — Y92012 Bathroom of single-family (private) house as the place of occurrence of the external cause: Secondary | ICD-10-CM | POA: Insufficient documentation

## 2017-10-03 DIAGNOSIS — Y999 Unspecified external cause status: Secondary | ICD-10-CM | POA: Insufficient documentation

## 2017-10-03 DIAGNOSIS — M545 Low back pain: Secondary | ICD-10-CM | POA: Diagnosis not present

## 2017-10-03 NOTE — Discharge Instructions (Signed)
Your images were negative for any abnormalities  Get help right away if: You have: A severe headache that is not helped by medicine. Trouble walking, have weakness in your arms and legs, or lose your balance. Clear or bloody fluid coming from your nose or ears. Changes in your vision. A seizure. You vomit. Your symptoms get worse. Your speech is slurred. You pass out. You are sleepier and have trouble staying awake. Your pupils change size.

## 2017-10-03 NOTE — ED Triage Notes (Addendum)
Patient is from home, taking a shower and loss his balance and fell.  Patient has a lump on the back of head.  No LOC, full recall of incident.  Patient was initially confused with possible GCS of 14.  Having pain across forehead.  Patient hit his bed on the bathtub.  Patient does have unequal pupils which is normal for him.

## 2017-10-03 NOTE — ED Notes (Signed)
Patient transported to CT and xray 

## 2017-10-03 NOTE — ED Provider Notes (Signed)
[facility] EMERGENCY DEPARTMENT Provider Note   CSN: [id number] Arrival date & time: 10/03/17  2137     History   Chief Complaint Chief Complaint  Patient presents with  . Fall    HPI [name] Love is a 46 y.o. male with a history of brain cancer and seizures.  Patient states that he slipped in the shower hitting the back of his head and also has pain in his lumbar region.  He denies loss of consciousness.  According to EMS report he was initially confused however that has resolved.  HPI  Past Medical History:  Diagnosis Date  . Brain cancer ([place])   . Seizures [facility])     Patient Active Problem List   Diagnosis Date Noted  . Myalgia 04/18/2017  . Seizures ([place]) 01/20/2017    Past Surgical History:  Procedure Laterality Date  . BRAIN SURGERY    . HERNIA REPAIR    . SHUNT REVISION         Home Medications    Prior to Admission medications   Medication Sig Start Date End Date Taking? Authorizing Provider  ALPRAZolam (XANAX) 0.25 MG tablet Take 0.25 mg by mouth daily. 1 tablet in the morning and 2 tablets at bedtime 01/13/17   [provider]  escitalopram (LEXAPRO) 20 MG tablet Take 20 mg by mouth daily.  12/31/16   [provider]  HYDROcodone-acetaminophen (NORCO/VICODIN) 5-325 MG tablet Take 1 tablet by mouth every 6 (six) hours as needed for moderate pain. Must last 28 days 09/15/17   Kathrynn Ducking, MD  ibuprofen (ADVIL,MOTRIN) 200 MG tablet Take 800 mg by mouth every 6 (six) hours as needed for mild pain or moderate pain.    [provider]  lamoTRIgine (LAMICTAL) 100 MG tablet Take 1 tablet (100 mg total) 2 (two) times daily by mouth. 08/08/17   Kathrynn Ducking, MD  levothyroxine (SYNTHROID, LEVOTHROID) 50 MCG tablet Take 50 mcg by mouth daily before breakfast.  12/25/16   [provider]  Multiple Vitamins-Minerals (MULTIVITAMIN WITH MINERALS) tablet Take 1 tablet by mouth daily.    [provider]    NUEDEXTA 20-10 MG CAPS Take 1 capsule by mouth 2 (two) times daily. 12/31/16   [provider]  polycarbophil (FIBERCON) 625 MG tablet Take 1,250 mg by mouth at bedtime.    [provider]  topiramate (TOPAMAX) 50 MG tablet One tablet in the morning and two in the evening 08/23/17   Kathrynn Ducking, MD  vitamin C (ASCORBIC ACID) 500 MG tablet Take 500 mg by mouth daily.    [provider]    Family History Family History  Problem Relation Age of Onset  . Brain cancer Mother   . High blood pressure Mother     Social History Social History   Tobacco Use  . Smoking status: Never Smoker  . Smokeless tobacco: Never Used  Substance Use Topics  . Alcohol use: No  . Drug use: No     Allergies   Baclofen; Sulfa antibiotics; and Contrast media [iodinated diagnostic agents]   Review of Systems Review of Systems  Ten systems reviewed and are negative for acute change, except as noted in the HPI.   Physical Exam Updated Vital Signs There were no vitals taken for this visit.  Physical Exam  Constitutional: He is oriented to person, place, and time. He appears well-developed and well-nourished. No distress.  HENT:  Head: Normocephalic and atraumatic.  Hematoma on the occipital  region  Eyes: Conjunctivae are normal. No scleral icterus.  Neck: Normal range of motion. Neck supple.  Cardiovascular: Normal rate, regular rhythm and normal heart sounds.  Pulmonary/Chest: Effort normal and breath sounds normal. No respiratory distress.  Abdominal: Soft. There is no tenderness.  Musculoskeletal: He exhibits no edema.  Neurological: He is alert and oriented to person, place, and time.  Speech is clear and goal oriented, follows commands Major Cranial nerves without deficit, no facial droop Normal strength in upper and lower extremities bilaterally including dorsiflexion and plantar flexion, strong and equal grip strength Sensation normal to light and sharp  touch Moves extremities without ataxia, coordination intact Normal finger to nose and rapid alternating movements Neg romberg, no pronator drift Gait deferred    Skin: Skin is warm and dry. He is not diaphoretic.  Psychiatric: His behavior is normal.  Nursing note and vitals reviewed.    ED Treatments / Results  Labs (all labs ordered are listed, but only abnormal results are displayed) Labs Reviewed - No data to display  EKG  EKG Interpretation None       Radiology No results found.  Procedures Procedures (including critical care time)  Medications Ordered in ED Medications - No data to display   Initial Impression / Assessment and Plan / ED Course  I have reviewed the triage vital signs and the nursing notes.  Pertinent labs & imaging results that were available during my care of the patient were reviewed by me and considered in my medical decision making (see chart for details).     Patient images are negative Will discharge. Take motrin or tylenol for pain. Discussed return precautions.  Final Clinical Impressions(s) / ED Diagnoses   Final diagnoses:  Fall, initial encounter  Hematoma of occipital surface of head, initial encounter    ED Discharge Orders    None       Margarita Mail, PA-C 10/03/17 [place], MD 10/04/17 [phone]

## 2017-10-04 NOTE — ED Notes (Signed)
Pt verbalized understanding discharge instructions and denies any further needs or questions at this time. VS stable, ambulatory and steady gait.   

## 2017-10-05 NOTE — Telephone Encounter (Signed)
We still have the serial number for the VP shunt placement, unable to get MRI of the brain.   The patient just had CT of the head and neck on 03 October 2017.  We will cancel the MRI brain study.   CT head and cervical 10/03/17:   IMPRESSION: CT BRAIN:  1. No acute intracranial process. 2. Small left occipital scalp contusion.  No calvarial fracture. 3. Small bilateral subdural hygromas, left slightly larger than right, likely related to shunting. No midline shift. 4. Right parietal approach VP shunt catheter in stable position. Stable ventricular size without hydrocephalus.  CT CERVICAL SPINE:  No acute traumatic injury within the cervical spine.

## 2017-10-05 NOTE — Telephone Encounter (Signed)
Unfortunately without the serial number model number and the make [facility] will not let me schedule it It does look like he went to the ER on 10/03/17 and had a CT Head and Cervical done. Do you want me to try to still get the MR Brain schedule as well?

## 2017-10-06 NOTE — Telephone Encounter (Signed)
Noted, thank you

## 2017-10-17 ENCOUNTER — Telehealth: Payer: Self-pay | Admitting: Neurology

## 2017-10-17 MED ORDER — HYDROCODONE-ACETAMINOPHEN 5-325 MG PO TABS: 1.0000 | ORAL_TABLET | Freq: Four times a day (QID) | ORAL | 0 refills | Status: DC | PRN

## 2017-10-17 NOTE — Telephone Encounter (Signed)
Pt aunt(on [place]) has called for refill prescription for HYDROcodone-acetaminophen (NORCO/VICODIN) 5-325 MG tablet, not changes in insurance according to pt's aunt.

## 2017-10-17 NOTE — Telephone Encounter (Signed)
The hydrocodone prescription will be refilled. 

## 2017-10-17 NOTE — Addendum Note (Signed)
Addended by: Kathrynn Ducking on: 10/17/2017 01:58 PM   Modules accepted: Orders

## 2017-10-17 NOTE — Telephone Encounter (Signed)
Placed printed/signed rx up front for patient pick up.  

## 2017-11-08 DIAGNOSIS — R000 Tachycardia, unspecified: Secondary | ICD-10-CM | POA: Diagnosis not present

## 2017-11-08 DIAGNOSIS — Z6833 Body mass index (BMI) 33.0-33.9, adult: Secondary | ICD-10-CM | POA: Diagnosis not present

## 2017-11-08 DIAGNOSIS — R002 Palpitations: Secondary | ICD-10-CM | POA: Diagnosis not present

## 2017-11-08 DIAGNOSIS — K219 Gastro-esophageal reflux disease without esophagitis: Secondary | ICD-10-CM | POA: Diagnosis not present

## 2017-11-17 MED ORDER — HYDROCODONE-ACETAMINOPHEN 5-325 MG PO TABS: 1.0000 | ORAL_TABLET | Freq: Four times a day (QID) | ORAL | 0 refills | Status: DC | PRN

## 2017-11-17 NOTE — Telephone Encounter (Signed)
Placed printed/signed rx up front for patient pick up.  

## 2017-11-17 NOTE — Addendum Note (Signed)
Addended by: Kathrynn Ducking on: 11/17/2017 10:59 AM   Modules accepted: Orders

## 2017-11-17 NOTE — Telephone Encounter (Signed)
The [facility] registry was checked, prescription for hydrocodone will be given.

## 2017-11-17 NOTE — Telephone Encounter (Signed)
Pt aunt calling requesting a refill for HYDROcodone-acetaminophen (NORCO/VICODIN) 5-325 MG tablet. Hassan Rowan also made aware the office will close at noon tomorrow

## 2017-11-30 ENCOUNTER — Encounter: Payer: Self-pay | Admitting: Cardiology

## 2017-11-30 ENCOUNTER — Ambulatory Visit: Payer: Self-pay | Admitting: Cardiology

## 2017-11-30 ENCOUNTER — Ambulatory Visit: Payer: Medicare Other | Admitting: Cardiology

## 2017-11-30 VITALS — BP 122/80 | HR 87 | Ht 66.0 in | Wt 222.1 lb

## 2017-11-30 DIAGNOSIS — R0789 Other chest pain: Secondary | ICD-10-CM | POA: Diagnosis not present

## 2017-11-30 DIAGNOSIS — M791 Myalgia, unspecified site: Secondary | ICD-10-CM | POA: Diagnosis not present

## 2017-11-30 DIAGNOSIS — R0609 Other forms of dyspnea: Secondary | ICD-10-CM | POA: Insufficient documentation

## 2017-11-30 DIAGNOSIS — R0600 Dyspnea, unspecified: Secondary | ICD-10-CM

## 2017-11-30 DIAGNOSIS — R569 Unspecified convulsions: Secondary | ICD-10-CM | POA: Diagnosis not present

## 2017-11-30 HISTORY — DX: Other chest pain: R07.89

## 2017-11-30 HISTORY — DX: Other forms of dyspnea: R06.09

## 2017-11-30 HISTORY — DX: Dyspnea, unspecified: R06.00

## 2017-11-30 MED ORDER — RANOLAZINE ER 500 MG PO TB12: 500.0000 mg | ORAL_TABLET | Freq: Two times a day (BID) | ORAL | 1 refills | Status: DC

## 2017-11-30 NOTE — Patient Instructions (Addendum)
Medication Instructions:  Your physician has recommended you make the following change in your medication:  Start Ranexa 500 1 tablet twice daily  Labwork: None ordered  Testing/Procedures: Your physician has requested that you have an echocardiogram. Echocardiography is a painless test that uses sound waves to create images of your heart. It provides your doctor with information about the size and shape of your heart and how well your heart's chambers and valves are working. This procedure takes approximately one hour. There are no restrictions for this procedure.  EKG Today  Follow-Up: Your physician recommends that you schedule a follow-up appointment in: 3 weeks with Dr. Marylen Ponto   Any Other Special Instructions Will Be Listed Below (If Applicable).     If you need a refill on your cardiac medications before your next appointment, please call your pharmacy.

## 2017-11-30 NOTE — Progress Notes (Signed)
Cardiology Consultation:    Date:  11/30/2017   ID:  [name] Love, DOB Feb 23, 1971, MRN [id number]  PCP:  Philmore Pali, NP  Cardiologist:  Jenne Campus, MD   Referring MD: Philmore Pali, NP   Chief Complaint  Patient presents with  . Shortness of Breath    With exersion  . Chest Pain  I have shortness of breath and chest pain  History of Present Illness:    [name] Love is a 47 y.o. male who is being seen today for the evaluation of chest pain and shortness of breath at the request of Philmore Pali, NP.  He was diagnosed at age of 83 with brain tumor he had surgery and after that he required 13 surgeries on his brain he also required multiple radiation therapy total of more than 60.  Really he ended up having some brain injury multiple complication of that therapy.  Apparently he has been cancer free for more than 13 years.  He was referred to me because of shortness of breath with exercise.  He said for last few months when he does think he will get short of breath quite easily.  He have to slow down and sometimes even stop to catch his breath.  He also complained of having some chest pain he describes as heaviness sometimes it happened when he had difficulty breathing.  There is no aggravating or relieving factors.  Overall history is somewhat difficult because of his ability to hear and sometimes understand concepts.  He never had any heart trouble.  He does not have any risk factors for coronary artery disease.  Never smoke.  He does not know what his cholesterol is.  Is no family history of artery disease  Past Medical History:  Diagnosis Date  . Brain cancer ([place])   . Seizures ([place])     Past Surgical History:  Procedure Laterality Date  . BRAIN SURGERY    . HERNIA REPAIR    . SHUNT REVISION      Current Medications: Current Meds  Medication Sig  . ALPRAZolam (XANAX) 0.25 MG tablet Take 0.25-0.5 mg by mouth See admin instructions. 1 tablet in the morning and 2 tablets at bedtime   . escitalopram (LEXAPRO) 20 MG tablet Take 20 mg by mouth daily.   Marland Kitchen HYDROcodone-acetaminophen (NORCO/VICODIN) 5-325 MG tablet Take 1 tablet by mouth every 6 (six) hours as needed for moderate pain. Must last 28 days  . ibuprofen (ADVIL,MOTRIN) 200 MG tablet Take 800 mg by mouth every 6 (six) hours as needed for mild pain or moderate pain.  Marland Kitchen lamoTRIgine (LAMICTAL) 100 MG tablet Take 1 tablet (100 mg total) 2 (two) times daily by mouth.  . levothyroxine (SYNTHROID, LEVOTHROID) 50 MCG tablet Take 50 mcg by mouth daily before breakfast.   . NUEDEXTA 20-10 MG CAPS Take 1 capsule by mouth 2 (two) times daily.  . polycarbophil (FIBERCON) 625 MG tablet Take 1,250 mg by mouth at bedtime.  Marland Kitchen QUEtiapine (SEROQUEL) 100 MG tablet Take 50 mg by mouth at bedtime.  . topiramate (TOPAMAX) 50 MG tablet One tablet in the morning and two in the evening     Allergies:   Baclofen; Sulfa antibiotics; and Contrast media [iodinated diagnostic agents]   Social History   Socioeconomic History  . Marital status: Single    Spouse name: None  . Number of children: 0  . Years of education: 83  . Highest education level: None  Social Needs  . Financial  resource strain: None  . Food insecurity - worry: None  . Food insecurity - inability: None  . Transportation needs - medical: None  . Transportation needs - non-medical: None  Occupational History  . Occupation: Disabled  Tobacco Use  . Smoking status: Never Smoker  . Smokeless tobacco: Never Used  Substance and Sexual Activity  . Alcohol use: No  . Drug use: No  . Sexual activity: None  Other Topics Concern  . None  Social History Narrative   Lives with Aunt [name])   Caffeine use: No coffee   Soda- trying to quit      Right-handed     Family History: The patient's family history includes Brain cancer in his mother; High blood pressure in his mother. ROS:   Please see the history of present illness.    All 14 point review of systems  negative except as described per history of present illness.  EKGs/Labs/Other Studies Reviewed:    The following studies were reviewed today: Multiple study review his visit.  EKG:  EKG is  ordered today.  The ekg ordered today demonstrates normal sinus rhythm normal P interval normal QS complex duration morphology is normal ST-T segment  Recent Labs: 03/07/2017: TSH 0.816 08/23/2017: ALT 19; BUN 17; Creatinine, Ser 1.20; Hemoglobin 13.6; Platelets 264; Potassium 4.6; Sodium 142  Recent Lipid Panel No results found for: CHOL, TRIG, HDL, CHOLHDL, VLDL, LDLCALC, LDLDIRECT  Physical Exam:    VS:  BP 122/80   Pulse 87   Ht 5\' 6"  (1.676 m)   Wt 222 lb 1.9 oz (100.8 kg)   SpO2 98%   BMI 35.85 kg/m     Wt Readings from Last 3 Encounters:  11/30/17 222 lb 1.9 oz (100.8 kg)  08/23/17 222 lb (100.7 kg)  06/29/17 202 lb (91.6 kg)     GEN:  Well nourished, well developed in no acute distress HEENT: Normal NECK: No JVD; No carotid bruits LYMPHATICS: No lymphadenopathy CARDIAC: RRR, no murmurs, no rubs, no gallops RESPIRATORY:  Clear to auscultation without rales, wheezing or rhonchi  ABDOMEN: Soft, non-tender, non-distended MUSCULOSKELETAL:  No edema; No deformity  SKIN: Warm and dry NEUROLOGIC:  Alert and oriented x 3 PSYCHIATRIC:  Normal affect   ASSESSMENT:    1. Seizures ([place])   2. Atypical chest pain   3. Dyspnea on exertion   4. Myalgia    PLAN:    In order of problems listed above:  1. Atypical chest pain.  Ideally I would recommend stress testing however with his condition overall I would prefer a little bit more conservative approach.  I will give him prescription for ranolazine and see if that helps with his symptomatology.  If it does and he feels fine I will simply continue conservative approach if not we may be forced to perform stress testing on him.  I am also reluctant to put him on aspirin because of multiple brain surgeries that he had.  He also got 2 shunts  in his brain.  Situation also is much more complicated because of his multiple pains that he got in multiple areas in his body. 2. Dyspnea on exertion: Echocardiogram will be done to assess left ventricular ejection fraction. 3. Myalgia: The be managed by internal medicine team. 4. Brain tumor with multiple brain surgery as well as multiple radiation therapy.  Noted   Medication Adjustments/Labs and Tests Ordered: Current medicines are reviewed at length with the patient today.  Concerns regarding medicines are outlined above.  Orders Placed This Encounter  Procedures  . EKG 12-Lead  . ECHOCARDIOGRAM COMPLETE   Meds ordered this encounter  Medications  . ranolazine (RANEXA) 500 MG 12 hr tablet    Sig: Take 1 tablet (500 mg total) by mouth 2 (two) times daily.    Dispense:  60 tablet    Refill:  1    Signed, Park Liter, MD, [facility]. 11/30/2017 2:57 PM    [facility]

## 2017-12-01 ENCOUNTER — Ambulatory Visit: Payer: Self-pay | Admitting: Cardiology

## 2017-12-01 ENCOUNTER — Telehealth: Payer: Self-pay | Admitting: Cardiology

## 2017-12-01 NOTE — Telephone Encounter (Signed)
Spoke with patient's mother, Hassan Rowan, about concern that Ranexa is too expensive for them to afford and are on disability. She wanted to know if there was any way to get the medication cheaper. Informed Hassan Rowan that we would send her the Ranexa Connects paperwork via mail. Hassan Rowan also expressed concern that patient is short of breath and she let patient use her albuterol inhaler and it helped. Kallie Edward against that.She wanted to see if an albuterol inhaler could be order for patient. I advised her to reach out to his PCP. She verbalized understanding and didn't have any further questions.

## 2017-12-01 NOTE — Telephone Encounter (Signed)
Please call auto about refills and medication

## 2017-12-02 DIAGNOSIS — H532 Diplopia: Secondary | ICD-10-CM | POA: Diagnosis not present

## 2017-12-02 DIAGNOSIS — H43393 Other vitreous opacities, bilateral: Secondary | ICD-10-CM | POA: Diagnosis not present

## 2017-12-02 DIAGNOSIS — H04123 Dry eye syndrome of bilateral lacrimal glands: Secondary | ICD-10-CM | POA: Diagnosis not present

## 2017-12-19 ENCOUNTER — Telehealth: Payer: Self-pay | Admitting: Cardiology

## 2017-12-19 NOTE — Telephone Encounter (Signed)
[name] Love wanted to know if she can bring all the ranexa patient assistance paperwork up to the office on Friday when they come for echocardiogram. Assured [name] Love that was fine. If we have samples available, asked if they could have some to get them through until patient assistance kicks in. No further questions.

## 2017-12-19 NOTE — Telephone Encounter (Signed)
Please call [name] Love, she has some questions.

## 2017-12-19 NOTE — Telephone Encounter (Signed)
Patient meant to call for Dr. Agustin Cree; stated that they had gotten their concern addressed.

## 2017-12-19 NOTE — Telephone Encounter (Signed)
Please call Hassan Rowan, she has some questions

## 2017-12-21 ENCOUNTER — Other Ambulatory Visit: Payer: Self-pay | Admitting: Neurology

## 2017-12-21 MED ORDER — HYDROCODONE-ACETAMINOPHEN 5-325 MG PO TABS: 1.0000 | ORAL_TABLET | Freq: Four times a day (QID) | ORAL | 0 refills | Status: DC | PRN

## 2017-12-21 NOTE — Telephone Encounter (Signed)
Pt aunt(on DPR) has called for a refill of HYDROcodone-acetaminophen (NORCO/VICODIN) 5-325 MG tablet please send to  [place], [place] - [address] [phone] (Phone) [phone] (Fax)

## 2017-12-21 NOTE — Addendum Note (Signed)
Addended by: Rossie Muskrat L on: 12/21/2017 10:08 AM   Modules accepted: Orders

## 2017-12-23 ENCOUNTER — Ambulatory Visit (HOSPITAL_BASED_OUTPATIENT_CLINIC_OR_DEPARTMENT_OTHER)
Admission: RE | Admit: 2017-12-23 | Discharge: 2017-12-23 | Disposition: A | Discharge status: Home / Self Care | Payer: Medicare Other | Source: Ambulatory Visit | Attending: Cardiology | Admitting: Cardiology

## 2017-12-23 DIAGNOSIS — R0609 Other forms of dyspnea: Secondary | ICD-10-CM | POA: Diagnosis not present

## 2017-12-23 DIAGNOSIS — R0789 Other chest pain: Secondary | ICD-10-CM | POA: Diagnosis not present

## 2017-12-23 MED ORDER — PERFLUTREN LIPID MICROSPHERE
1.0000 mL | INTRAVENOUS | Status: AC | PRN
  Administered 2017-12-23: 6 mL via INTRAVENOUS
  Filled 2017-12-23: qty 10

## 2017-12-23 NOTE — Progress Notes (Signed)
Echocardiogram 2D Echocardiogram with contrast has been performed.  Joelene Millin 12/23/2017, 3:55 PM

## 2017-12-29 ENCOUNTER — Ambulatory Visit: Payer: Medicare Other | Admitting: Cardiology

## 2017-12-29 VITALS — BP 118/86 | HR 95 | Ht 66.0 in | Wt 227.0 lb

## 2017-12-29 DIAGNOSIS — F0634 Mood disorder due to known physiological condition with mixed features: Secondary | ICD-10-CM | POA: Diagnosis not present

## 2017-12-29 DIAGNOSIS — R569 Unspecified convulsions: Secondary | ICD-10-CM

## 2017-12-29 DIAGNOSIS — F482 Pseudobulbar affect: Secondary | ICD-10-CM | POA: Diagnosis not present

## 2017-12-29 DIAGNOSIS — R0609 Other forms of dyspnea: Secondary | ICD-10-CM

## 2017-12-29 DIAGNOSIS — F064 Anxiety disorder due to known physiological condition: Secondary | ICD-10-CM | POA: Diagnosis not present

## 2017-12-29 DIAGNOSIS — R0789 Other chest pain: Secondary | ICD-10-CM

## 2017-12-29 MED ORDER — RANOLAZINE ER 1000 MG PO TB12: 1000.0000 mg | ORAL_TABLET | Freq: Two times a day (BID) | ORAL | 3 refills | Status: DC

## 2017-12-29 NOTE — Progress Notes (Signed)
Cardiology Office Note:    Date:  12/29/2017   ID:  [name] Love, DOB 1971-07-29, MRN [id number]  PCP:  Philmore Pali, NP  Cardiologist:  Jenne Campus, MD    Referring MD: Philmore Pali, NP   No chief complaint on file. Feeling better  History of Present Illness:    [name] Love is a 47 y.o. male with brain cancer, multiple surgery multiple radiation.  Apparently in remission.  He was referred to me because of atypical chest pain that happen with no exertion.  Because of multiple comorbidities we decided to go conservatively.  I gave him a ranolazine 500 mg twice daily which seems to be helping he said he improved about 50% he said pain is gone. He still complained of having some shortness of breath with happen when he bends forward.  Also when he walk longer distance.  Echocardiogram has been done which showed normal left ventricular ejection fraction.  Past Medical History:  Diagnosis Date  . Brain cancer ([place])   . Seizures ([place])     Past Surgical History:  Procedure Laterality Date  . BRAIN SURGERY    . HERNIA REPAIR    . SHUNT REVISION      Current Medications: Current Meds  Medication Sig  . Ascorbic Acid (VITAMIN C ER PO) Take by mouth daily.  . Multiple Vitamin (MULTIVITAMIN) tablet Take 1 tablet by mouth daily.     Allergies:   Baclofen; Sulfa antibiotics; and Contrast media [iodinated diagnostic agents]   Social History   Socioeconomic History  . Marital status: Single    Spouse name: Not on file  . Number of children: 0  . Years of education: 4  . Highest education level: Not on file  Occupational History  . Occupation: Disabled  Social Needs  . Financial resource strain: Not on file  . Food insecurity:    Worry: Not on file    Inability: Not on file  . Transportation needs:    Medical: Not on file    Non-medical: Not on file  Tobacco Use  . Smoking status: Never Smoker  . Smokeless tobacco: Never Used  Substance and Sexual Activity  . Alcohol use:  No  . Drug use: No  . Sexual activity: Not on file  Lifestyle  . Physical activity:    Days per week: Not on file    Minutes per session: Not on file  . Stress: Not on file  Relationships  . Social connections:    Talks on phone: Not on file    Gets together: Not on file    Attends religious service: Not on file    Active member of club or organization: Not on file    Attends meetings of clubs or organizations: Not on file    Relationship status: Not on file  Other Topics Concern  . Not on file  Social History Narrative   Lives with Aunt [name])   Caffeine use: No coffee   Soda- trying to quit      Right-handed     Family History: The patient's family history includes Brain cancer in his mother; High blood pressure in his mother. ROS:   Please see the history of present illness.    All 14 point review of systems negative except as described per history of present illness  EKGs/Labs/Other Studies Reviewed:      Recent Labs: 03/07/2017: TSH 0.816 08/23/2017: ALT 19; BUN 17; Creatinine, Ser 1.20; Hemoglobin 13.6; Platelets 264; Potassium  4.6; Sodium 142  Recent Lipid Panel No results found for: CHOL, TRIG, HDL, CHOLHDL, VLDL, LDLCALC, LDLDIRECT  Physical Exam:    VS:  BP 118/86 (BP Location: Right Arm, Patient Position: Sitting, Cuff Size: Normal)   Pulse 95   Wt 227 lb (103 kg)   SpO2 97%   BMI 36.64 kg/m     Wt Readings from Last 3 Encounters:  12/29/17 227 lb (103 kg)  11/30/17 222 lb 1.9 oz (100.8 kg)  08/23/17 222 lb (100.7 kg)     GEN:  Well nourished, well developed in no acute distress HEENT: Normal NECK: No JVD; No carotid bruits LYMPHATICS: No lymphadenopathy CARDIAC: RRR, no murmurs, no rubs, no gallops RESPIRATORY:  Clear to auscultation without rales, wheezing or rhonchi  ABDOMEN: Soft, non-tender, non-distended MUSCULOSKELETAL:  No edema; No deformity  SKIN: Warm and dry LOWER EXTREMITIES: no swelling NEUROLOGIC:  Alert and oriented  x 3 PSYCHIATRIC:  Normal affect   ASSESSMENT:    1. Atypical chest pain   2. Dyspnea on exertion   3. Seizures ([place])    PLAN:    In order of problems listed above:  1. Atypical chest pain: Improved about 50% with ranolazine 500 mg twice daily.  Because of comorbidity I prefer conservative approach.  We will increase dose of ranolazine 1000 g twice daily and will see him back next month.  If he will be still symptomatic we will consider doing stress testing. 2. Dyspnea on exertion: Echocardiogram showed preserved left ventricular ejection fraction.  Plan is as outlined above. 3. Seizures: Denies having any.   Medication Adjustments/Labs and Tests Ordered: Current medicines are reviewed at length with the patient today.  Concerns regarding medicines are outlined above.  No orders of the defined types were placed in this encounter.  Medication changes: No orders of the defined types were placed in this encounter.   Signed, Park Liter, MD, [facility] 12/29/2017 10:08 AM    [place]

## 2017-12-29 NOTE — Patient Instructions (Addendum)
Medication Instructions:  Your physician has recommended you make the following change in your medication:  INCREASE ranolazine (ranexa) 1,000 mg twice daily  Labwork: None  Testing/Procedures: None  Follow-Up: Your physician recommends that you schedule a follow-up appointment in: 1 month.  Any Other Special Instructions Will Be Listed Below (If Applicable).     If you need a refill on your cardiac medications before your next appointment, please call your pharmacy.

## 2017-12-30 ENCOUNTER — Other Ambulatory Visit: Payer: Self-pay | Admitting: *Deleted

## 2017-12-30 MED ORDER — RANOLAZINE ER 1000 MG PO TB12: 1000.0000 mg | ORAL_TABLET | Freq: Two times a day (BID) | ORAL | 3 refills | Status: DC

## 2017-12-31 ENCOUNTER — Other Ambulatory Visit: Payer: Self-pay

## 2017-12-31 ENCOUNTER — Emergency Department
Admission: EM | Admit: 2017-12-31 | Discharge: 2017-12-31 | Disposition: A | Discharge status: Home / Self Care | Payer: Medicare Other | Attending: Emergency Medicine | Admitting: Emergency Medicine

## 2017-12-31 ENCOUNTER — Encounter: Payer: Self-pay | Admitting: Emergency Medicine

## 2017-12-31 DIAGNOSIS — Z85841 Personal history of malignant neoplasm of brain: Secondary | ICD-10-CM | POA: Insufficient documentation

## 2017-12-31 DIAGNOSIS — R0602 Shortness of breath: Secondary | ICD-10-CM | POA: Diagnosis not present

## 2017-12-31 DIAGNOSIS — R062 Wheezing: Secondary | ICD-10-CM | POA: Diagnosis not present

## 2017-12-31 DIAGNOSIS — Z760 Encounter for issue of repeat prescription: Secondary | ICD-10-CM | POA: Diagnosis not present

## 2017-12-31 DIAGNOSIS — Z79899 Other long term (current) drug therapy: Secondary | ICD-10-CM | POA: Diagnosis not present

## 2017-12-31 MED ORDER — ALBUTEROL SULFATE HFA 108 (90 BASE) MCG/ACT IN AERS: 1.0000 | INHALATION_SPRAY | Freq: Four times a day (QID) | RESPIRATORY_TRACT | 0 refills | Status: DC | PRN

## 2017-12-31 NOTE — ED Triage Notes (Signed)
Pt talking in complete sentences on his cellphone while walking to the triage room. Lungs clear all quadrants.

## 2017-12-31 NOTE — ED Notes (Signed)
Pt able to ambulate to treatment room without trouble or increased WOB. Pt in NAD at this time.

## 2017-12-31 NOTE — ED Triage Notes (Signed)
Pt states he has been evaluated by cardiology for the sob - and was referred to pulmonology since it is not his heart causing it. Pt states his primary care doctor will not give him an inhaler and was told to come to ed for one.

## 2017-12-31 NOTE — Discharge Instructions (Addendum)
Please use albuterol inhaler as prescribed as needed.  Please follow-up with pulmonologist.  If any increasing shortness of breath return to the emergency department.

## 2017-12-31 NOTE — ED Provider Notes (Signed)
[facility] EMERGENCY DEPARTMENT Provider Note   CSN: [id number] Arrival date & time: 12/31/17  1749     History   Chief Complaint No chief complaint on file.   HPI [name] Love is a 47 y.o. male.  Presents to the emergency department for evaluation of medication refill.  Patient states he has intermittent episodes of shortness of breath that has been present for several months.  He was initially evaluated by cardiologist who performed EKG and echo which was unremarkable.  Patient has been referred to pulmonologist his is scheduled to see pulmonologist.  He has been using albuterol inhaler for his shortness of breath 3 puffs a day which seems to be helping.  He denies any history of smoking, no recent fevers, cough congestion or runny nose.  He denies any productive cough.  He denies any current wheezing, shortness of breath, chest pain.  HPI  Past Medical History:  Diagnosis Date  . Brain cancer ([place])   . Seizures [facility])     Patient Active Problem List   Diagnosis Date Noted  . Atypical chest pain 11/30/2017  . Dyspnea on exertion 11/30/2017  . Myalgia 04/18/2017  . Seizures ([place]) 01/20/2017    Past Surgical History:  Procedure Laterality Date  . BRAIN SURGERY    . HERNIA REPAIR    . SHUNT REVISION          Home Medications    Prior to Admission medications   Medication Sig Start Date End Date Taking? Authorizing Provider  albuterol (PROVENTIL HFA;VENTOLIN HFA) 108 (90 Base) MCG/ACT inhaler Inhale 1-2 puffs into the lungs every 6 (six) hours as needed for wheezing or shortness of breath. 12/31/17   Duanne Guess, PA-C  ALPRAZolam Duanne Moron) 0.25 MG tablet Take 0.25-0.5 mg by mouth See admin instructions. 1 tablet in the morning and 2 tablets at bedtime 01/13/17   [provider]  Ascorbic Acid (VITAMIN C ER PO) Take by mouth daily.    [provider]  escitalopram (LEXAPRO) 20 MG tablet Take 20 mg by mouth daily.  12/31/16    [provider]  HYDROcodone-acetaminophen (NORCO/VICODIN) 5-325 MG tablet Take 1 tablet by mouth every 6 (six) hours as needed for moderate pain. Must last 28 days 12/21/17   Kathrynn Ducking, MD  ibuprofen (ADVIL,MOTRIN) 200 MG tablet Take 800 mg by mouth every 6 (six) hours as needed for mild pain or moderate pain.    [provider]  lamoTRIgine (LAMICTAL) 100 MG tablet Take 1 tablet (100 mg total) 2 (two) times daily by mouth. 08/08/17   Kathrynn Ducking, MD  levothyroxine (SYNTHROID, LEVOTHROID) 50 MCG tablet Take 50 mcg by mouth daily before breakfast.  12/25/16   [provider]  Multiple Vitamin (MULTIVITAMIN) tablet Take 1 tablet by mouth daily.    [provider]  NUEDEXTA 20-10 MG CAPS Take 1 capsule by mouth 2 (two) times daily. 12/31/16   [provider]  polycarbophil (FIBERCON) 625 MG tablet Take 1,250 mg by mouth at bedtime.    [provider]  QUEtiapine (SEROQUEL) 100 MG tablet Take 50 mg by mouth at bedtime.    [provider]  ranolazine (RANEXA) 1000 MG SR tablet Take 1 tablet (1,000 mg total) by mouth 2 (two) times daily. 12/30/17   Park Liter, MD  topiramate (TOPAMAX) 50 MG tablet One tablet in the morning and two in the evening 08/23/17   Kathrynn Ducking, MD    Family History Family  History  Problem Relation Age of Onset  . Brain cancer Mother   . High blood pressure Mother     Social History Social History   Tobacco Use  . Smoking status: Never Smoker  . Smokeless tobacco: Never Used  Substance Use Topics  . Alcohol use: No  . Drug use: No     Allergies   Baclofen; Sulfa antibiotics; and Contrast media [iodinated diagnostic agents]   Review of Systems Review of Systems  Constitutional: Negative for fever.  Respiratory: Negative for cough, shortness of breath, wheezing and stridor.   Cardiovascular: Negative for chest pain.  Gastrointestinal: Negative for abdominal pain.    Genitourinary: Negative for difficulty urinating, dysuria and urgency.  Musculoskeletal: Negative for back pain and myalgias.  Skin: Negative for rash.  Neurological: Negative for dizziness and headaches.     Physical Exam Updated Vital Signs BP 127/89   Pulse 91   Temp 98.7 F (37.1 C) (Oral)   Resp 18   Ht 5\' 6"  (1.676 m)   Wt 103 kg (227 lb)   SpO2 96%   BMI 36.64 kg/m   Physical Exam  Constitutional: He is oriented to person, place, and time. He appears well-developed and well-nourished.  HENT:  Head: Normocephalic and atraumatic.  Eyes: Conjunctivae are normal.  Neck: Normal range of motion.  Cardiovascular: Normal rate, normal heart sounds and intact distal pulses.  Pulmonary/Chest: Effort normal. No respiratory distress. He has wheezes (.  Slight expiratory wheezing upper lobes). He has no rales.  Abdominal: Soft.  Musculoskeletal: Normal range of motion.  No lower extremity swelling or edema.  Neurological: He is alert and oriented to person, place, and time.  Skin: Skin is warm. No rash noted.  Psychiatric: He has a normal mood and affect. His behavior is normal. Thought content normal.     ED Treatments / Results  Labs (all labs ordered are listed, but only abnormal results are displayed) Labs Reviewed - No data to display  EKG None  Radiology No results found.  Procedures Procedures (including critical care time)  Medications Ordered in ED Medications - No data to display   Initial Impression / Assessment and Plan / ED Course  I have reviewed the triage vital signs and the nursing notes.  Pertinent labs & imaging results that were available during my care of the patient were reviewed by me and considered in my medical decision making (see chart for details).    47 year old male with history of shortness of breath, wheezing.  Has been using albuterol inhaler with relief.  He is scheduled to see pulmonologist.  States he will be running out of  his albuterol inhaler and is requesting refill today.  Patient given prescription for albuterol inhaler.  Vital signs are within normal limits.  No signs of respiratory distress.  Currently with no chest pain or shortness of breath.  Patient is educated on signs and symptoms to return to the ED for.  He will follow-up with pulmonologist. Final Clinical Impressions(s) / ED Diagnoses   Final diagnoses:  Medication refill  Wheezing  Shortness of breath    ED Discharge Orders        Ordered    albuterol (PROVENTIL HFA;VENTOLIN HFA) 108 (90 Base) MCG/ACT inhaler  Every 6 hours PRN     12/31/17 1910       Renata Caprice 12/31/17 [place], [place], MD 01/01/18 [id number]

## 2018-01-02 ENCOUNTER — Telehealth: Payer: Self-pay | Admitting: Cardiology

## 2018-01-02 DIAGNOSIS — R0609 Other forms of dyspnea: Secondary | ICD-10-CM

## 2018-01-02 NOTE — Telephone Encounter (Signed)
Wants him referred to a pulmonologist

## 2018-01-05 NOTE — Addendum Note (Signed)
Addended by: Aleatha Borer on: 01/05/2018 09:53 AM   Modules accepted: Orders

## 2018-01-09 DIAGNOSIS — R0609 Other forms of dyspnea: Secondary | ICD-10-CM | POA: Diagnosis not present

## 2018-01-09 DIAGNOSIS — Z68.41 Body mass index (BMI) pediatric, greater than or equal to 95th percentile for age: Secondary | ICD-10-CM | POA: Diagnosis not present

## 2018-01-11 ENCOUNTER — Encounter: Payer: Self-pay | Admitting: Cardiology

## 2018-01-13 DIAGNOSIS — J019 Acute sinusitis, unspecified: Secondary | ICD-10-CM | POA: Diagnosis not present

## 2018-01-13 DIAGNOSIS — H6121 Impacted cerumen, right ear: Secondary | ICD-10-CM | POA: Diagnosis not present

## 2018-01-13 DIAGNOSIS — Z6834 Body mass index (BMI) 34.0-34.9, adult: Secondary | ICD-10-CM | POA: Diagnosis not present

## 2018-01-17 ENCOUNTER — Encounter: Payer: Self-pay | Admitting: Pulmonary Disease

## 2018-01-18 ENCOUNTER — Ambulatory Visit: Payer: Medicare Other | Admitting: Pulmonary Disease

## 2018-01-18 ENCOUNTER — Other Ambulatory Visit (INDEPENDENT_AMBULATORY_CARE_PROVIDER_SITE_OTHER): Payer: Self-pay

## 2018-01-18 ENCOUNTER — Encounter: Payer: Self-pay | Admitting: Pulmonary Disease

## 2018-01-18 VITALS — BP 128/80 | HR 110 | Ht 66.0 in | Wt 225.8 lb

## 2018-01-18 DIAGNOSIS — R0602 Shortness of breath: Secondary | ICD-10-CM | POA: Diagnosis not present

## 2018-01-18 DIAGNOSIS — R0609 Other forms of dyspnea: Secondary | ICD-10-CM | POA: Diagnosis not present

## 2018-01-18 LAB — BASIC METABOLIC PANEL
BUN: 9 mg/dL (ref 6–23)
CALCIUM: 9.6 mg/dL (ref 8.4–10.5)
CO2: 25 mEq/L (ref 19–32)
Chloride: 106 mEq/L (ref 96–112)
Creatinine, Ser: 1.3 mg/dL (ref 0.40–1.50)
GFR: 63.58 mL/min (ref >60.00)
Glucose, Bld: 96 mg/dL (ref 70–99)
Potassium: 4.4 mEq/L (ref 3.5–5.1)
SODIUM: 139 meq/L (ref 135–145)

## 2018-01-18 LAB — D-DIMER, QUANTITATIVE: D-Dimer, Quant: 0.45 mcg/mL FEU (ref <0.50)

## 2018-01-18 NOTE — Assessment & Plan Note (Addendum)
Cause is unclear to me at this time he had mild wheezing after ambulating around the office. Marland Kitchen  However spirometry only shows mild restriction and no evidence of airway obstruction to suggest asthma.  I have asked him to use albuterol on an as-needed basis but did not feel the need to increase asthma type medications. His dyspnea could be related to weight gain and associated deconditioning.  But I feel obligated to rule out pulmonary embolism here due to his limited mobility and gait issues.  He would like to avoid unnecessary radiation and we will try to obtain a d-dimer today, if this is negative no further testing would be necessary.  However if this is positive we will proceed with CT angiogram  Although he has received radiation to his spine I do not see significant evidence of radiation fibrosis in his lungs and his prior imaging studies

## 2018-01-18 NOTE — Patient Instructions (Addendum)
Blood work to check for blood clot Shortness of breath may be related to weight gain

## 2018-01-18 NOTE — Progress Notes (Signed)
Subjective:    Patient ID: [name] Love, male    DOB: 01/07/1971, 47 y.o.   MRN: [id number]  HPI  Chief Complaint  Patient presents with  . Consult    Referred by [facility] and Dr. Vanetta Shawl due to DOE.  Pt states SOB has been  bad x3 months but has become worse to where he is now becoming SOB all the time and not just with activities. Pt does have postnasal drainage from sinus infection and some CP.    47 year old never smoker presents for evaluation of dyspnea on exertion which has been ongoing for about 3 months. He reports dyspnea of insidious onset about 3 months ago which is gradually worsening.  He denies wheezing or sputum production or chronic cough.  He has been evaluated by cardiology including an echo that showed normal ejection fraction.  He presented with atypical chest pain which is now resolved.  He has gained 50 pounds in the last 3 months Chest x-ray from 07/2016 does not show any infiltrates or effusions. CT abdomen from 08/2017 shows clear lung fields  He has a history of ependymoma diagnosed in 1995 and since then he has had multiple brain surgeries and whole brain radiation multiple times with also radiation to his spine.  He has been left with deafness, gait imbalance, memory issues and poor dentition.  He also developed hallucinations and has been seeing psychiatry and is on Abilify and Seroquel.  He attributes weight gain to Seroquel.  He also has a VP shunt, seizure disorder and chronic whole body pain.  He ambulates with a cane.  He is disabled and lives with his dad.  His grandfather had hardening of the lungs which I presume refers to pulmonary fibrosis  Significant tests/ events reviewed  Ambulatory saturation -O2 saturation remained at 95% on walking 2 laps heart rate remained between 95-100  Spirometry showed ratio 76, FEV1 of 69% and FVC of 72% suggesting mild restriction  Past Medical History:  Diagnosis Date  . Abnormal gait   . Allergic rhinitis   . Anxiety    . Bilateral arm weakness   . Brain cancer ([place])   . Depression   . Ependymoma of brain ([place])   . Hearing loss, sensorineural   . Hypothyroidism   . Insomnia   . Left leg weakness   . Low vitamin B12 level   . Seizures ([place])    Past Surgical History:  Procedure Laterality Date  . BRAIN SURGERY    . HERNIA REPAIR    . SHUNT REVISION      Allergies  Allergen Reactions  . Baclofen     Urinary retension  . Sulfa Antibiotics   . Contrast Media [Iodinated Diagnostic Agents] Rash    Social History   Socioeconomic History  . Marital status: Single    Spouse name: Not on file  . Number of children: 0  . Years of education: 36  . Highest education level: Not on file  Occupational History  . Occupation: Disabled  Social Needs  . Financial resource strain: Not on file  . Food insecurity:    Worry: Not on file    Inability: Not on file  . Transportation needs:    Medical: Not on file    Non-medical: Not on file  Tobacco Use  . Smoking status: Never Smoker  . Smokeless tobacco: Never Used  Substance and Sexual Activity  . Alcohol use: No  . Drug use: No  . Sexual activity: Not  on file  Lifestyle  . Physical activity:    Days per week: Not on file    Minutes per session: Not on file  . Stress: Not on file  Relationships  . Social connections:    Talks on phone: Not on file    Gets together: Not on file    Attends religious service: Not on file    Active member of club or organization: Not on file    Attends meetings of clubs or organizations: Not on file    Relationship status: Not on file  . Intimate partner violence:    Fear of current or ex partner: Not on file    Emotionally abused: Not on file    Physically abused: Not on file    Forced sexual activity: Not on file  Other Topics Concern  . Not on file  Social History Narrative   Lives with Aunt [name])   Caffeine use: No coffee   Soda- trying to quit      Right-handed      Family  History  Problem Relation Age of Onset  . Brain cancer Mother   . High blood pressure Mother      Review of Systems  Constitutional: Positive for unexpected weight change. Negative for fever.  HENT: Positive for congestion, dental problem, postnasal drip and sinus pressure. Negative for ear pain, nosebleeds, rhinorrhea, sneezing, sore throat and trouble swallowing.   Eyes: Negative for redness and itching.  Respiratory: Positive for cough, shortness of breath and wheezing. Negative for chest tightness.   Cardiovascular: Positive for palpitations. Negative for leg swelling.  Gastrointestinal: Negative for nausea and vomiting.  Genitourinary: Negative for dysuria.  Musculoskeletal: Negative for joint swelling.  Skin: Negative for rash.  Allergic/Immunologic: Positive for food allergies. Negative for environmental allergies and immunocompromised state.  Neurological: Negative for headaches.  Hematological: Does not bruise/bleed easily.  Psychiatric/Behavioral: Positive for dysphoric mood. The patient is nervous/anxious.        Objective:   Physical Exam  Gen. Pleasant, obese, in no distress, anxious affect ENT - no lesions, no post nasal drip, class 2-3 airway Neck: No JVD, no thyromegaly, no carotid bruits Lungs: no use of accessory muscles, no dullness to percussion, decreased without rales or rhonchi  Cardiovascular: Rhythm regular, heart sounds  normal, no murmurs or gallops, no peripheral edema Abdomen: soft and non-tender, no hepatosplenomegaly, BS normal. Musculoskeletal: No deformities, no cyanosis or clubbing, ambulates with cane Neuro:  alert, non focal, no tremors       Assessment & Plan:

## 2018-01-23 ENCOUNTER — Other Ambulatory Visit: Payer: Self-pay | Admitting: Neurology

## 2018-01-23 MED ORDER — HYDROCODONE-ACETAMINOPHEN 5-325 MG PO TABS: 1.0000 | ORAL_TABLET | Freq: Four times a day (QID) | ORAL | 0 refills | Status: DC | PRN

## 2018-01-23 NOTE — Telephone Encounter (Signed)
Pt request refill for HYDROcodone-acetaminophen (NORCO/VICODIN) 5-325 MG tablet [facility].

## 2018-01-31 ENCOUNTER — Ambulatory Visit: Payer: Self-pay | Admitting: Cardiology

## 2018-02-02 ENCOUNTER — Ambulatory Visit (INDEPENDENT_AMBULATORY_CARE_PROVIDER_SITE_OTHER): Payer: Medicare Other | Admitting: Neurology

## 2018-02-02 ENCOUNTER — Encounter: Payer: Self-pay | Admitting: Neurology

## 2018-02-02 ENCOUNTER — Encounter (INDEPENDENT_AMBULATORY_CARE_PROVIDER_SITE_OTHER): Payer: Self-pay

## 2018-02-02 VITALS — BP 122/82 | HR 93 | Wt 225.5 lb

## 2018-02-02 DIAGNOSIS — Z79891 Long term (current) use of opiate analgesic: Secondary | ICD-10-CM

## 2018-02-02 DIAGNOSIS — H532 Diplopia: Secondary | ICD-10-CM

## 2018-02-02 DIAGNOSIS — Z5181 Encounter for therapeutic drug level monitoring: Secondary | ICD-10-CM | POA: Diagnosis not present

## 2018-02-02 NOTE — Progress Notes (Signed)
Reason for visit: Seizures  [name] Love is an 47 y.o. male  History of present illness:  [name] Love is a 47 year old right-handed white male with a history of an ependymoma resection of the brain, status post radiation therapy.  The patient has seizures, he has done well with seizure control on Lamictal and Topamax.  The patient had been on Seroquel through his psychiatrist, but this was stopped and he was switched to Abilify.  Coming off of Seroquel, the patient has had a dramatic improvement in his cognitive functioning level, he is much more alert and sharp with his cognitive abilities.  The patient continues to have a significant gait disorder, he uses a cane for ambulation, he will fall on occasion.  He has had improvement in his discomfort, he no longer has significant neck or shoulder pain, he does have some pain in the hips and knee area, he does take hydrocodone on occasion.  The patient has recently been seen through pulmonology for some shortness of breath and has an inhaler.  He returns for an evaluation.  He does have some double vision that comes and goes, he was given new glasses to his eye doctor which seems to have improved the double vision.  Past Medical History:  Diagnosis Date  . Abnormal gait   . Allergic rhinitis   . Anxiety   . Bilateral arm weakness   . Brain cancer ([place])   . Depression   . Ependymoma of brain ([place])   . Hearing loss, sensorineural   . Hypothyroidism   . Insomnia   . Left leg weakness   . Low vitamin B12 level   . Seizures ([place])     Past Surgical History:  Procedure Laterality Date  . BRAIN SURGERY    . HERNIA REPAIR    . SHUNT REVISION      Family History  Problem Relation Age of Onset  . Brain cancer Mother   . High blood pressure Mother     Social history:  reports that he has never smoked. He has never used smokeless tobacco. He reports that he does not drink alcohol or use drugs.    Allergies  Allergen Reactions  . Baclofen       Urinary retension  . Seroquel [Quetiapine]     Weight gain  . Sulfa Antibiotics   . Coconut Flavor Rash    ANYTHING COCONUT  . Contrast Media [Iodinated Diagnostic Agents] Rash    Medications:  Prior to Admission medications   Medication Sig Start Date End Date Taking? Authorizing Provider  albuterol (PROVENTIL HFA;VENTOLIN HFA) 108 (90 Base) MCG/ACT inhaler Inhale 1-2 puffs into the lungs every 6 (six) hours as needed for wheezing or shortness of breath. 12/31/17  Yes [name] Guess, PA-C  ALPRAZolam [name]) 0.25 MG tablet Take 0.25-0.5 mg by mouth See admin instructions. 1 tablet in the morning and 2 tablets at bedtime 01/13/17  Yes [name]  ARIPiprazole (ABILIFY) 5 MG tablet Take 5 mg by mouth at bedtime.   Yes [name] Love  escitalopram (LEXAPRO) 20 MG tablet Take 20 mg by mouth daily.  12/31/16  Yes [name]  HYDROcodone-acetaminophen (NORCO/VICODIN) 5-325 MG tablet Take 1 tablet by mouth every 6 (six) hours as needed for moderate pain. Must last 28 days 01/23/18  Yes [name] Ducking, Love  ibuprofen (ADVIL,MOTRIN) 200 MG tablet Take 800 mg by mouth every 6 (six) hours as needed for mild pain or moderate pain.  Yes [name] Love  lamoTRIgine (LAMICTAL) 100 MG tablet Take 1 tablet (100 mg total) 2 (two) times daily by mouth. 08/08/17  Yes [name] Ducking, Love  levothyroxine (SYNTHROID, LEVOTHROID) 50 MCG tablet Take 50 mcg by mouth daily before breakfast.  12/25/16  Yes [name]  NUEDEXTA 20-10 MG CAPS Take 1 capsule by mouth 2 (two) times daily. 12/31/16  Yes [name] Love  omega-3 fish oil (MAXEPA) 1000 MG CAPS capsule Take 1 capsule by mouth 2 (two) times daily.   Yes [name] Love  omeprazole (PRILOSEC) 20 MG capsule Take 20 mg by mouth daily.   Yes [name]  polycarbophil (FIBERCON) 625 MG tablet Take 1,250 mg by mouth at bedtime.   Yes [name]  ranolazine  (RANEXA) 1000 MG SR tablet Take 1 tablet (1,000 mg total) by mouth 2 (two) times daily. 12/30/17  Yes [name] Liter, Love  topiramate (TOPAMAX) 50 MG tablet One tablet in the morning and two in the evening 08/23/17  Yes [name] Ducking, Love    ROS:  Out of a complete 14 system review of symptoms, the patient complains only of the following symptoms, and all other reviewed systems are negative.  Double vision Seizures  Blood pressure 122/82, pulse 93, weight 225 lb 8 oz (102.3 kg).  Physical Exam  General: The patient is alert and cooperative at the time of the examination.  The patient is moderately obese.  Skin: No significant peripheral edema is noted.   Neurologic Exam  Mental status: The patient is alert and oriented x 3 at the time of the examination. The patient has apparent normal recent and remote memory, with an apparently normal attention span and concentration ability.   Cranial nerves: Facial symmetry is present. Speech is normal, no aphasia or dysarthria is noted. Extraocular movements are full. Visual fields are full.  Motor: The patient has good strength in all 4 extremities.  Sensory examination: Soft touch sensation is symmetric on the face, arms, and legs.  Coordination: The patient has good finger-nose-finger and heel-to-shin bilaterally.  Gait and station: The patient has a slightly wide-based gait, the patient usually uses a cane for ambulation.  Tandem gait is unsteady.  Romberg is positive, the patient falls backwards. No drift is seen.  Reflexes: Deep tendon reflexes are symmetric.   Assessment/Plan:  1.  Seizures, well controlled  2.  Chronic pain syndrome  3.  Double vision  4.  Gait disorder  The patient will have further blood work today, will check Lamictal levels, evaluate the double vision.  The patient will have a urine drug screen.  He will follow-up in 6 months.  He will continue the Topamax and Lamictal.  [name] Alexanders  Love 02/02/2018 12:51 PM  [facility] [address] [place], [name] [address]  Phone [phone] Fax [phone]

## 2018-02-03 LAB — TSH: TSH: 2.19 u[IU]/mL (ref 0.450–4.500)

## 2018-02-03 LAB — LAMOTRIGINE LEVEL: Lamotrigine Lvl: 2.8 ug/mL (ref 2.0–20.0)

## 2018-02-03 LAB — ACETYLCHOLINE RECEPTOR, BINDING

## 2018-02-06 ENCOUNTER — Telehealth: Payer: Self-pay | Admitting: *Deleted

## 2018-02-06 NOTE — Telephone Encounter (Signed)
Called and spoke w/ [name] about unremarkable labs per CW,MD note. She verbalized understanding.

## 2018-02-06 NOTE — Telephone Encounter (Signed)
-----   Message from Kathrynn Ducking, MD sent at 02/05/2018  8:22 AM EDT -----  The blood work results are unremarkable. Please call the patient.  ----- Message ----- From: Lavone Neri Lab Results In Sent: 02/03/2018   7:41 AM To: Kathrynn Ducking, MD

## 2018-02-08 LAB — COMPLIANCE DRUG ANALYSIS, UR

## 2018-02-09 ENCOUNTER — Other Ambulatory Visit: Payer: Self-pay | Admitting: Neurology

## 2018-02-17 ENCOUNTER — Ambulatory Visit: Payer: Self-pay | Admitting: Adult Health

## 2018-02-18 DIAGNOSIS — R42 Dizziness and giddiness: Secondary | ICD-10-CM | POA: Diagnosis not present

## 2018-02-18 DIAGNOSIS — R079 Chest pain, unspecified: Secondary | ICD-10-CM | POA: Diagnosis not present

## 2018-02-18 DIAGNOSIS — Z79899 Other long term (current) drug therapy: Secondary | ICD-10-CM | POA: Diagnosis not present

## 2018-02-18 DIAGNOSIS — R0789 Other chest pain: Secondary | ICD-10-CM | POA: Diagnosis not present

## 2018-02-18 DIAGNOSIS — Z882 Allergy status to sulfonamides status: Secondary | ICD-10-CM | POA: Diagnosis not present

## 2018-02-18 DIAGNOSIS — R55 Syncope and collapse: Secondary | ICD-10-CM | POA: Diagnosis not present

## 2018-02-18 DIAGNOSIS — R404 Transient alteration of awareness: Secondary | ICD-10-CM | POA: Diagnosis not present

## 2018-02-20 ENCOUNTER — Other Ambulatory Visit: Payer: Self-pay | Admitting: Neurology

## 2018-03-03 ENCOUNTER — Other Ambulatory Visit: Payer: Self-pay | Admitting: Neurology

## 2018-03-03 MED ORDER — HYDROCODONE-ACETAMINOPHEN 5-325 MG PO TABS: 1.0000 | ORAL_TABLET | Freq: Four times a day (QID) | ORAL | 0 refills | Status: DC | PRN

## 2018-03-03 NOTE — Telephone Encounter (Signed)
Pt requesting a refill for HYDROcodone-acetaminophen (NORCO/VICODIN) 5-325 MG tablet sent to [facility].

## 2018-03-07 ENCOUNTER — Ambulatory Visit: Payer: Self-pay | Admitting: Adult Health

## 2018-03-07 DIAGNOSIS — Z6833 Body mass index (BMI) 33.0-33.9, adult: Secondary | ICD-10-CM | POA: Diagnosis not present

## 2018-03-07 DIAGNOSIS — R682 Dry mouth, unspecified: Secondary | ICD-10-CM | POA: Diagnosis not present

## 2018-03-29 DIAGNOSIS — F064 Anxiety disorder due to known physiological condition: Secondary | ICD-10-CM | POA: Diagnosis not present

## 2018-03-29 DIAGNOSIS — F0634 Mood disorder due to known physiological condition with mixed features: Secondary | ICD-10-CM | POA: Diagnosis not present

## 2018-03-29 DIAGNOSIS — F482 Pseudobulbar affect: Secondary | ICD-10-CM | POA: Diagnosis not present

## 2018-03-30 DIAGNOSIS — F482 Pseudobulbar affect: Secondary | ICD-10-CM | POA: Diagnosis not present

## 2018-03-30 DIAGNOSIS — Z79899 Other long term (current) drug therapy: Secondary | ICD-10-CM | POA: Diagnosis not present

## 2018-03-30 DIAGNOSIS — R05 Cough: Secondary | ICD-10-CM | POA: Diagnosis not present

## 2018-03-30 DIAGNOSIS — Z6833 Body mass index (BMI) 33.0-33.9, adult: Secondary | ICD-10-CM | POA: Diagnosis not present

## 2018-04-03 ENCOUNTER — Telehealth: Payer: Self-pay | Admitting: Adult Health

## 2018-04-03 ENCOUNTER — Encounter: Payer: Self-pay | Admitting: Adult Health

## 2018-04-03 ENCOUNTER — Ambulatory Visit: Payer: Medicare Other | Admitting: Adult Health

## 2018-04-03 ENCOUNTER — Ambulatory Visit (INDEPENDENT_AMBULATORY_CARE_PROVIDER_SITE_OTHER)
Admission: RE | Admit: 2018-04-03 | Discharge: 2018-04-03 | Disposition: A | Discharge status: Home / Self Care | Payer: Medicare Other | Source: Ambulatory Visit | Attending: Adult Health | Admitting: Adult Health

## 2018-04-03 DIAGNOSIS — R0609 Other forms of dyspnea: Secondary | ICD-10-CM

## 2018-04-03 DIAGNOSIS — J309 Allergic rhinitis, unspecified: Secondary | ICD-10-CM

## 2018-04-03 DIAGNOSIS — R05 Cough: Secondary | ICD-10-CM | POA: Diagnosis not present

## 2018-04-03 DIAGNOSIS — R0602 Shortness of breath: Secondary | ICD-10-CM | POA: Diagnosis not present

## 2018-04-03 LAB — NITRIC OXIDE: Nitric Oxide: 19

## 2018-04-03 MED ORDER — ALBUTEROL SULFATE HFA 108 (90 BASE) MCG/ACT IN AERS: 1.0000 | INHALATION_SPRAY | Freq: Four times a day (QID) | RESPIRATORY_TRACT | 5 refills | Status: DC | PRN

## 2018-04-03 MED ORDER — BUDESONIDE-FORMOTEROL FUMARATE 80-4.5 MCG/ACT IN AERO: 2.0000 | INHALATION_SPRAY | Freq: Two times a day (BID) | RESPIRATORY_TRACT | 3 refills | Status: DC

## 2018-04-03 MED ORDER — BUDESONIDE-FORMOTEROL FUMARATE 80-4.5 MCG/ACT IN AERO: 2.0000 | INHALATION_SPRAY | Freq: Two times a day (BID) | RESPIRATORY_TRACT | 0 refills | Status: DC

## 2018-04-03 NOTE — Patient Instructions (Signed)
Chest x-ray today Begin Symbicort 80 2 puffs twice daily, rinse after use Begin Zyrtec 10 mg at bedtime Continue on Flonase nasal spray 1 puff daily each nare Mucinex DM twice daily as needed for cough and congestion Follow-up with Dr. Elsworth Soho in 6 to 8 weeks and as needed

## 2018-04-03 NOTE — Progress Notes (Signed)
Patient seen in the office today and instructed on use of Symbicort 80-4.20mcg.  Patient expressed understanding and demonstrated technique. Parke Poisson, CMA 04/03/18

## 2018-04-03 NOTE — Addendum Note (Signed)
Addended by: Parke Poisson E on: 04/03/2018 12:25 PM   Modules accepted: Orders

## 2018-04-03 NOTE — Assessment & Plan Note (Addendum)
Dyspnea on exertion with associated cough and wheezing questionable reactive airways versus mild asthma.  We will try Symbicort 2 puffs twice daily. Add in Zyrtec to help control with triggers.Marland Kitchen/AR  Check cxr today .   Plan  Patient Instructions  Chest x-ray today Begin Symbicort 80 2 puffs twice daily, rinse after use Begin Zyrtec 10 mg at bedtime Continue on Flonase nasal spray 1 puff daily each nare Mucinex DM twice daily as needed for cough and congestion Follow-up with Dr. Elsworth Soho in 6 to 8 weeks and as needed

## 2018-04-03 NOTE — Telephone Encounter (Signed)
Attempted to call pt. I did not receive an answer. I have left a message for pt to return our call.  

## 2018-04-03 NOTE — Progress Notes (Signed)
@Patient  ID: [name] Love, male    DOB: [birth year], 47 y.o.   MRN: [id number]  Chief Complaint  Patient presents with  . Follow-up    Referring provider: Philmore Pali, NP  HPI: 47 year old male never smoker seen for pulmonary consult January 18, 2018 for dyspnea with exertion for 3 months PMH significant for ependymoma diagnosed in 1995 and since then he has had multiple brain surgeries and whole brain radiation multiple times with also radiation to his spine.  He has been left with deafness, gait imbalance, memory issues and poor dentition.  He also developed hallucinations and has been seeing psychiatry and is on Abilify and Seroquel.  He attributes weight gain to Seroquel.  He also has a VP shunt, seizure disorder and chronic whole body pain.  He ambulates with a cane.  He is disabled and lives with his dad.   Significant tests/ events reviewed  Ambulatory saturation -O2 saturation remained at 95% on walking 2 laps heart rate remained between 95-100  Spirometry showed ratio 76, FEV1 of 69% and FVC of 72% suggesting mild restriction   04/03/2018 Follow up: Dyspnea  Patient presents for a 65-month follow-up.  Patient was seen last visit for a pulmonary consult for shortness of breath.  Last visit patient was having increased shortness of breath with activity over the last 3 months.  He was felt to have possibly deconditioning and obesity contributing to his shortness of breath.  Labs with a d-dimer was negative.  Previous spirometry had showed mild to moderate restriction.  Care everywhere notes showed normal CBC last in May. Patient says since last visit his shortness of breath has not changed.  Now he has noticed that over the last couple months he has daily cough with thick mucus.  Especially worse in the morning.  He also has some intermittent wheezing.  He uses Ventolin 2-3 times a day.  Which he says does help.  He denies any chest pain, hemoptysis, abdominal pain, increased edema. FENO  test today -19    Allergies  Allergen Reactions  . Baclofen     Urinary retension  . Seroquel [Quetiapine]     Weight gain  . Sulfa Antibiotics   . Coconut Flavor Rash    ANYTHING COCONUT  . Contrast Media [Iodinated Diagnostic Agents] Rash     There is no immunization history on file for this patient.  Past Medical History:  Diagnosis Date  . Abnormal gait   . Allergic rhinitis   . Anxiety   . Bilateral arm weakness   . Brain cancer ([place])   . Depression   . Ependymoma of brain ([place])   . Hearing loss, sensorineural   . Hypothyroidism   . Insomnia   . Left leg weakness   . Low vitamin B12 level   . Seizures ([place])     Tobacco History: Social History   Tobacco Use  Smoking Status Never Smoker  Smokeless Tobacco Never Used   Counseling given: Not Answered   Outpatient Encounter Medications as of 04/03/2018  Medication Sig  . albuterol (PROVENTIL HFA;VENTOLIN HFA) 108 (90 Base) MCG/ACT inhaler Inhale 1-2 puffs into the lungs every 6 (six) hours as needed for wheezing or shortness of breath.  . ALPRAZolam (XANAX) 0.25 MG tablet Take 0.25-0.5 mg by mouth See admin instructions. 1 tablet in the morning and 2 tablets at bedtime  . ARIPiprazole (ABILIFY) 5 MG tablet Take 5 mg by mouth at bedtime.  Marland Kitchen escitalopram (LEXAPRO) 20 MG tablet Take  20 mg by mouth daily.   Marland Kitchen ibuprofen (ADVIL,MOTRIN) 200 MG tablet Take 800 mg by mouth every 6 (six) hours as needed for mild pain or moderate pain.  Marland Kitchen lamoTRIgine (LAMICTAL) 100 MG tablet TAKE 1 TABLET BY MOUTH TWICE DAILY  . levothyroxine (SYNTHROID, LEVOTHROID) 50 MCG tablet Take 50 mcg by mouth daily before breakfast.   . NUEDEXTA 20-10 MG CAPS Take 1 capsule by mouth 2 (two) times daily.  Marland Kitchen omega-3 fish oil (MAXEPA) 1000 MG CAPS capsule Take 1 capsule by mouth 2 (two) times daily.  Marland Kitchen omeprazole (PRILOSEC) 20 MG capsule Take 20 mg by mouth daily.  . polycarbophil (FIBERCON) 625 MG tablet Take 1,250 mg by mouth at bedtime.  .  ranolazine (RANEXA) 1000 MG SR tablet Take 1 tablet (1,000 mg total) by mouth 2 (two) times daily.  Marland Kitchen topiramate (TOPAMAX) 50 MG tablet TAKE 1 TABLET BY MOUTH IN THE MORNING AND 2 IN THE EVENING  . HYDROcodone-acetaminophen (NORCO/VICODIN) 5-325 MG tablet Take 1 tablet by mouth every 6 (six) hours as needed for moderate pain. Must last 28 days (Patient not taking: Reported on 04/03/2018)   No facility-administered encounter medications on file as of 04/03/2018.      Review of Systems  Constitutional:   No  weight loss, night sweats,  Fevers, chills, + fatigue, or  lassitude.  HEENT:   No headaches,  Difficulty swallowing,  Tooth/dental problems, or  Sore throat,                No sneezing, itching, ear ache, nasal congestion, post nasal drip,   CV:  No chest pain,  Orthopnea, PND, swelling in lower extremities, anasarca, dizziness, palpitations, syncope.   GI  No heartburn, indigestion, abdominal pain, nausea, vomiting, diarrhea, change in bowel habits, loss of appetite, bloody stools.   Resp: No chest wall deformity  Skin: no rash or lesions.  GU: no dysuria, change in color of urine, no urgency or frequency.  No flank pain, no hematuria   MS:  No joint pain or swelling.  No decreased range of motion.  No back pain. Chronic gait imbalance+  Physical Exam  BP 102/82 (BP Location: Right Arm, Cuff Size: Normal)   Pulse 93   Ht 5\' 7"  (1.702 m)   Wt 221 lb 12.8 oz (100.6 kg)   SpO2 98%   BMI 34.74 kg/m   GEN: A/Ox3; pleasant , NAD, obese   HEENT:  [place]/AT, hearing aids, NOSE-clear, THROAT-clear, no lesions, no postnasal drip or exudate noted.   NECK:  Supple w/ fair ROM; no JVD; normal carotid impulses w/o bruits; no thyromegaly or nodules palpated; no lymphadenopathy.    RESP  Clear  P & A; w/o, wheezes/ rales/ or rhonchi. no accessory muscle use, no dullness to percussion  CARD:  RRR, no m/r/g, tr peripheral edema, pulses intact, no cyanosis or clubbing.  GI:   Soft & nt; nml  bowel sounds; no organomegaly or masses detected.   Musco: Warm bil, no deformities or joint swelling noted.   Neuro: alert, no focal deficits noted.    Skin: Warm, no lesions or rashes    Lab Results:  CBC    Component Value Date/Time   WBC 7.3 08/23/2017 1059   WBC 9.3 04/22/2015 1708   RBC 4.42 08/23/2017 1059   RBC 4.88 04/22/2015 1708   HGB 13.6 08/23/2017 1059   HCT 39.9 08/23/2017 1059   PLT 264 08/23/2017 1059   MCV 90 08/23/2017 1059   MCV  92 11/06/2014 1751   MCH 30.8 08/23/2017 1059   MCH 31.8 04/22/2015 1708   MCHC 34.1 08/23/2017 1059   MCHC 34.0 04/22/2015 1708   RDW 13.6 08/23/2017 1059   RDW 12.8 11/06/2014 1751   LYMPHSABS 2.0 08/23/2017 1059   LYMPHSABS 1.4 03/12/2014 1140   MONOABS 0.5 03/12/2014 1140   EOSABS 0.3 08/23/2017 1059   EOSABS 0.2 03/12/2014 1140   BASOSABS 0.0 08/23/2017 1059   BASOSABS 0.0 03/12/2014 1140    BMET    Component Value Date/Time   NA 139 01/18/2018 1608   NA 142 08/23/2017 1059   NA 142 11/06/2014 1751   K 4.4 01/18/2018 1608   K 3.7 11/06/2014 1751   CL 106 01/18/2018 1608   CL 106 11/06/2014 1751   CO2 25 01/18/2018 1608   CO2 29 11/06/2014 1751   GLUCOSE 96 01/18/2018 1608   GLUCOSE 91 11/06/2014 1751   BUN 9 01/18/2018 1608   BUN 17 08/23/2017 1059   BUN 14 11/06/2014 1751   CREATININE 1.30 01/18/2018 1608   CREATININE 1.11 11/06/2014 1751   CALCIUM 9.6 01/18/2018 1608   CALCIUM 8.8 11/06/2014 1751   GFRNONAA 73 08/23/2017 1059   GFRNONAA >60 11/06/2014 1751   GFRNONAA >60 03/12/2014 1140   GFRAA 85 08/23/2017 1059   GFRAA >60 11/06/2014 1751   GFRAA >60 03/12/2014 1140    BNP No results found for: BNP  ProBNP No results found for: PROBNP  Imaging: No results found.   Assessment & Plan:   Dyspnea on exertion Dyspnea on exertion with associated cough and wheezing questionable reactive airways versus mild asthma.  We will try Symbicort 2 puffs twice daily. Add in Zyrtec to help control  with triggers.Marland KitchenWilson  Patient Instructions  Chest x-ray today Begin Symbicort 80 2 puffs twice daily, rinse after use Begin Zyrtec 10 mg at bedtime Continue on Flonase nasal spray 1 puff daily each nare Mucinex DM twice daily as needed for cough and congestion Follow-up with Dr. Elsworth Soho in 6 to 8 weeks and as needed       Allergic rhinitis Add Zyrtec daily .      Rexene Edison, NP 04/03/2018

## 2018-04-03 NOTE — Assessment & Plan Note (Signed)
Add Zyrtec daily .

## 2018-04-04 ENCOUNTER — Telehealth: Payer: Self-pay | Admitting: Adult Health

## 2018-04-04 NOTE — Progress Notes (Signed)
Spoke with Hassan Rowan (ok per DPR) and notified of results/recs per TP  Nothing further needed

## 2018-04-04 NOTE — Telephone Encounter (Signed)
Called pt who stated he could not afford the Symbicort this month due to the cost but should be able to afford it next month.  Stated to pt if he needed another sample to call our office and we could see if we could be able to help him out.  Pt expressed understanding. Stated he was going to go to pharmacy to pick up albuterol Rx.  Nothing further needed.

## 2018-04-04 NOTE — Telephone Encounter (Signed)
Received fax and completed Tier exception form, faxed back to [facility].

## 2018-04-04 NOTE — Telephone Encounter (Signed)
Called and spoke to [place] with [employer] and gave her out fax number for the tier exception form to be faxed. Will await fax.

## 2018-04-07 NOTE — Telephone Encounter (Signed)
Spoke with Colletta Maryland B. At [facility] about the tier exception. She stated that the tier exception had been denied.   Left detailed message for Marcie Bal.

## 2018-04-10 MED ORDER — FLUTICASONE FUROATE-VILANTEROL 100-25 MCG/INH IN AEPB: 1.0000 | INHALATION_SPRAY | Freq: Every day | RESPIRATORY_TRACT | 5 refills | Status: DC

## 2018-04-10 NOTE — Telephone Encounter (Signed)
Okay to change to Breo 100 once daily

## 2018-04-10 NOTE — Telephone Encounter (Signed)
Rx Breo 100 sent to pt's preferred pharmacy. Discontinued Symbicort off pt's med list. Nothing further needed.

## 2018-04-10 NOTE — Telephone Encounter (Signed)
Are there are any alternatives on his formulary? Denice Paradise, Breo okay

## 2018-04-10 NOTE — Telephone Encounter (Signed)
lmtcb X2 for [employer] at [place].  Need to find out other formulary alternatives that are cheaper options for pt.

## 2018-04-10 NOTE — Telephone Encounter (Signed)
Dr. Elsworth Soho, please advise if you want to switch pt to a different inhaler than the Symbicort as the lower tier exception was denied per pt's insurance of [place].  Pt was started on symbicort 80 by TP at last OV 04/03/18

## 2018-04-10 NOTE — Telephone Encounter (Signed)
(  Micheline Rough [phone]) states there are no alternatives for Symbicort at lower Tiers; if we could give patient samples and coupons

## 2018-04-10 NOTE — Telephone Encounter (Signed)
Per Marcie Bal with [employer], there is no alternatives for Symbicort at lower Tiers. If you wanted to keep pt on Symbicort, Marcie Bal was requesting for pt to receive samples and also see if we have any coupons to give pt.  Memory Dance is a Tier 3, Ruthe Mannan is a Tier 4, and Advair is a Tier 3.

## 2018-04-18 ENCOUNTER — Telehealth: Payer: Self-pay | Admitting: Adult Health

## 2018-04-18 NOTE — Telephone Encounter (Signed)
Called and spoke with patient, he is requesting to go back to the symbicort instead of taking the [facility]. Patient states that the Memory Dance would be the same price for him and since the symbicort was doing good for him he would like to stay with that. Patient then states that he would not be able to afford the medication and would like to stay on it by obtaining samples. Advised patient that we do not have the amount of samples to do that but we could get him set up with patient assistance. Patient advised that he would like to do that if RA ok'd him to go back on symbicort.   RA please advise if this is ok and if so which strength. Patient will then need to be set up with patient assistance.

## 2018-04-19 NOTE — Telephone Encounter (Signed)
RA please advise below message please.

## 2018-04-19 NOTE — Telephone Encounter (Signed)
Attempted to call pt. I did not receive an answer. I have left a message for pt to return our call.  

## 2018-04-19 NOTE — Telephone Encounter (Signed)
Okay to go with Symbicort 160 for patient assistance, 2 puffs twice daily

## 2018-04-20 NOTE — Telephone Encounter (Signed)
Attempted to call pt. I did not receive an answer. I have left a message for pt to return our call.  

## 2018-04-21 NOTE — Telephone Encounter (Signed)
Attempted to call Patient.  Left message to call back. 

## 2018-04-25 NOTE — Telephone Encounter (Signed)
Spoke with pt, states that Symbicort 160 is $10 cheaper than the Breo.  I offered to send a rx to pharmacy but pt declined, stating he cannot afford it until next month.  Sample and patient assistance left up front for pickup, and pt advised to contact our office when he is ready for rx to be sent to pharmacy.  Nothing further needed at this time.

## 2018-04-25 NOTE — Telephone Encounter (Signed)
Spoke with pt.  He states that he actually isnt sure if Breo or Symbicort is cheaper.  He is going to call pharmacy and see which one he wants to stay on. He will call us back.  I advised that if we have samples we can give him one sample and provide him with patient assist forms to fill out. Will await pt call back.

## 2018-04-25 NOTE — Telephone Encounter (Signed)
Left message for patient-need to let him know of RA's answer and get him patient assistance forms.

## 2018-04-25 NOTE — Telephone Encounter (Signed)
Patient calling back regarding rx cost - He can be reached at [phone] -pr

## 2018-05-17 ENCOUNTER — Ambulatory Visit: Payer: Self-pay | Admitting: Pulmonary Disease

## 2018-06-06 ENCOUNTER — Other Ambulatory Visit: Payer: Self-pay | Admitting: Neurology

## 2018-06-06 MED ORDER — HYDROCODONE-ACETAMINOPHEN 5-325 MG PO TABS: 1.0000 | ORAL_TABLET | Freq: Four times a day (QID) | ORAL | 0 refills | Status: DC | PRN

## 2018-06-06 NOTE — Telephone Encounter (Signed)
Pt has called for a refill on his HYDROcodone-acetaminophen (NORCO/VICODIN) 5-325 MG tablet please send to  [place], [place] - [address] [phone] (Phone) [phone] (Fax)

## 2018-06-06 NOTE — Addendum Note (Signed)
Addended by: Rossie Muskrat L on: 06/06/2018 11:25 AM   Modules accepted: Orders

## 2018-06-07 ENCOUNTER — Other Ambulatory Visit: Payer: Self-pay

## 2018-06-07 ENCOUNTER — Telehealth: Payer: Self-pay | Admitting: Neurology

## 2018-06-07 ENCOUNTER — Emergency Department: Payer: Medicare Other

## 2018-06-07 ENCOUNTER — Inpatient Hospital Stay
Admission: EM | Admit: 2018-06-07 | Discharge: 2018-06-08 | DRG: 065 | Disposition: A | Discharge status: Other Acute Inpatient Hospital | Payer: Medicare Other | Attending: Internal Medicine | Admitting: Internal Medicine

## 2018-06-07 DIAGNOSIS — G8191 Hemiplegia, unspecified affecting right dominant side: Secondary | ICD-10-CM | POA: Diagnosis not present

## 2018-06-07 DIAGNOSIS — I6381 Other cerebral infarction due to occlusion or stenosis of small artery: Secondary | ICD-10-CM | POA: Diagnosis present

## 2018-06-07 DIAGNOSIS — I639 Cerebral infarction, unspecified: Secondary | ICD-10-CM | POA: Diagnosis not present

## 2018-06-07 DIAGNOSIS — G43909 Migraine, unspecified, not intractable, without status migrainosus: Secondary | ICD-10-CM | POA: Diagnosis present

## 2018-06-07 DIAGNOSIS — R0609 Other forms of dyspnea: Secondary | ICD-10-CM | POA: Diagnosis not present

## 2018-06-07 DIAGNOSIS — I63219 Cerebral infarction due to unspecified occlusion or stenosis of unspecified vertebral arteries: Secondary | ICD-10-CM | POA: Diagnosis present

## 2018-06-07 DIAGNOSIS — R296 Repeated falls: Secondary | ICD-10-CM | POA: Diagnosis not present

## 2018-06-07 DIAGNOSIS — K219 Gastro-esophageal reflux disease without esophagitis: Secondary | ICD-10-CM | POA: Diagnosis not present

## 2018-06-07 DIAGNOSIS — E039 Hypothyroidism, unspecified: Secondary | ICD-10-CM | POA: Diagnosis present

## 2018-06-07 DIAGNOSIS — Z85841 Personal history of malignant neoplasm of brain: Secondary | ICD-10-CM | POA: Diagnosis not present

## 2018-06-07 DIAGNOSIS — Z91041 Radiographic dye allergy status: Secondary | ICD-10-CM

## 2018-06-07 DIAGNOSIS — R200 Anesthesia of skin: Secondary | ICD-10-CM | POA: Diagnosis not present

## 2018-06-07 DIAGNOSIS — Z880 Allergy status to penicillin: Secondary | ICD-10-CM | POA: Diagnosis not present

## 2018-06-07 DIAGNOSIS — R29701 NIHSS score 1: Secondary | ICD-10-CM | POA: Diagnosis not present

## 2018-06-07 DIAGNOSIS — Z79899 Other long term (current) drug therapy: Secondary | ICD-10-CM | POA: Diagnosis not present

## 2018-06-07 DIAGNOSIS — I6389 Other cerebral infarction: Principal | ICD-10-CM | POA: Diagnosis present

## 2018-06-07 DIAGNOSIS — Z888 Allergy status to other drugs, medicaments and biological substances status: Secondary | ICD-10-CM | POA: Diagnosis not present

## 2018-06-07 DIAGNOSIS — Z7951 Long term (current) use of inhaled steroids: Secondary | ICD-10-CM | POA: Diagnosis not present

## 2018-06-07 DIAGNOSIS — I6322 Cerebral infarction due to unspecified occlusion or stenosis of basilar arteries: Secondary | ICD-10-CM

## 2018-06-07 DIAGNOSIS — F418 Other specified anxiety disorders: Secondary | ICD-10-CM | POA: Diagnosis not present

## 2018-06-07 DIAGNOSIS — H532 Diplopia: Secondary | ICD-10-CM | POA: Diagnosis not present

## 2018-06-07 DIAGNOSIS — R269 Unspecified abnormalities of gait and mobility: Secondary | ICD-10-CM | POA: Diagnosis not present

## 2018-06-07 DIAGNOSIS — Z9102 Food additives allergy status: Secondary | ICD-10-CM | POA: Diagnosis not present

## 2018-06-07 DIAGNOSIS — H905 Unspecified sensorineural hearing loss: Secondary | ICD-10-CM | POA: Diagnosis not present

## 2018-06-07 DIAGNOSIS — Z982 Presence of cerebrospinal fluid drainage device: Secondary | ICD-10-CM | POA: Diagnosis not present

## 2018-06-07 DIAGNOSIS — W19XXXA Unspecified fall, initial encounter: Secondary | ICD-10-CM | POA: Diagnosis present

## 2018-06-07 DIAGNOSIS — F419 Anxiety disorder, unspecified: Secondary | ICD-10-CM | POA: Diagnosis not present

## 2018-06-07 DIAGNOSIS — R569 Unspecified convulsions: Secondary | ICD-10-CM | POA: Diagnosis not present

## 2018-06-07 HISTORY — DX: Cerebral infarction, unspecified: I63.9

## 2018-06-07 LAB — COMPREHENSIVE METABOLIC PANEL
ALT: 17 U/L (ref 0–44)
ANION GAP: 9 (ref 5–15)
AST: 21 U/L (ref 15–41)
Albumin: 4.2 g/dL (ref 3.5–5.0)
Alkaline Phosphatase: 95 U/L (ref 38–126)
BILIRUBIN TOTAL: 0.6 mg/dL (ref 0.3–1.2)
BUN: 13 mg/dL (ref 6–20)
CHLORIDE: 107 mmol/L (ref 98–111)
CO2: 22 mmol/L (ref 22–32)
Calcium: 8.7 mg/dL — ABNORMAL LOW (ref 8.9–10.3)
Creatinine, Ser: 1.41 mg/dL — ABNORMAL HIGH (ref 0.61–1.24)
GFR, EST NON AFRICAN AMERICAN: 59 mL/min — AB (ref >60)
Glucose, Bld: 122 mg/dL — ABNORMAL HIGH (ref 70–99)
POTASSIUM: 3.5 mmol/L (ref 3.5–5.1)
Sodium: 138 mmol/L (ref 135–145)
Total Protein: 7 g/dL (ref 6.5–8.1)

## 2018-06-07 LAB — CBC WITH DIFFERENTIAL/PLATELET
Basophils Absolute: 0 10*3/uL (ref 0–0.1)
Basophils Relative: 1 %
Eosinophils Absolute: 0.2 10*3/uL (ref 0–0.7)
Eosinophils Relative: 2 %
HCT: 45.3 % (ref 40.0–52.0)
HEMOGLOBIN: 15.7 g/dL (ref 13.0–18.0)
LYMPHS ABS: 1.4 10*3/uL (ref 1.0–3.6)
LYMPHS PCT: 17 %
MCH: 32.2 pg (ref 26.0–34.0)
MCHC: 34.6 g/dL (ref 32.0–36.0)
MCV: 92.9 fL (ref 80.0–100.0)
Monocytes Absolute: 0.6 10*3/uL (ref 0.2–1.0)
Monocytes Relative: 7 %
NEUTROS PCT: 73 %
Neutro Abs: 6.1 10*3/uL (ref 1.4–6.5)
Platelets: 240 10*3/uL (ref 150–440)
RBC: 4.87 MIL/uL (ref 4.40–5.90)
RDW: 13 % (ref 11.5–14.5)
WBC: 8.2 10*3/uL (ref 3.8–10.6)

## 2018-06-07 LAB — APTT: APTT: 31 s (ref 24–36)

## 2018-06-07 LAB — PROTIME-INR
INR: 1.01
PROTHROMBIN TIME: 13.2 s (ref 11.4–15.2)

## 2018-06-07 LAB — TROPONIN I

## 2018-06-07 MED ORDER — LEVOTHYROXINE SODIUM 50 MCG PO TABS
50.0000 ug | ORAL_TABLET | Freq: Every day | ORAL | Status: DC
  Administered 2018-06-08: 50 ug via ORAL
  Filled 2018-06-07: qty 1

## 2018-06-07 MED ORDER — HYDROCODONE-ACETAMINOPHEN 5-325 MG PO TABS
1.0000 | ORAL_TABLET | Freq: Four times a day (QID) | ORAL | Status: DC | PRN
  Administered 2018-06-07: 20:00:00 1 via ORAL
  Filled 2018-06-07: qty 1

## 2018-06-07 MED ORDER — ALBUTEROL SULFATE (2.5 MG/3ML) 0.083% IN NEBU: 2.5000 mg | INHALATION_SOLUTION | Freq: Four times a day (QID) | RESPIRATORY_TRACT | Status: DC | PRN

## 2018-06-07 MED ORDER — ACETAMINOPHEN 650 MG RE SUPP: 650.0000 mg | Freq: Four times a day (QID) | RECTAL | Status: DC | PRN

## 2018-06-07 MED ORDER — ASPIRIN EC 325 MG PO TBEC
325.0000 mg | DELAYED_RELEASE_TABLET | Freq: Every day | ORAL | Status: DC
  Administered 2018-06-08: 325 mg via ORAL
  Filled 2018-06-07: qty 1

## 2018-06-07 MED ORDER — ASPIRIN 81 MG PO CHEW
324.0000 mg | CHEWABLE_TABLET | Freq: Once | ORAL | Status: AC
  Administered 2018-06-07: 324 mg via ORAL
  Filled 2018-06-07: qty 4

## 2018-06-07 MED ORDER — OMEGA-3-ACID ETHYL ESTERS 1 G PO CAPS
1.0000 | ORAL_CAPSULE | Freq: Two times a day (BID) | ORAL | Status: DC
  Administered 2018-06-07 – 2018-06-08 (×2): 1 g via ORAL
  Filled 2018-06-07 (×2): qty 1

## 2018-06-07 MED ORDER — TOPIRAMATE 100 MG PO TABS
100.0000 mg | ORAL_TABLET | Freq: Every day | ORAL | Status: DC
  Administered 2018-06-07: 20:00:00 100 mg via ORAL
  Filled 2018-06-07 (×2): qty 1

## 2018-06-07 MED ORDER — PANTOPRAZOLE SODIUM 40 MG PO TBEC
40.0000 mg | DELAYED_RELEASE_TABLET | Freq: Every day | ORAL | Status: DC
  Administered 2018-06-08: 09:00:00 40 mg via ORAL
  Filled 2018-06-07: qty 1

## 2018-06-07 MED ORDER — ESCITALOPRAM OXALATE 10 MG PO TABS
20.0000 mg | ORAL_TABLET | Freq: Every day | ORAL | Status: DC
  Administered 2018-06-08: 09:00:00 20 mg via ORAL
  Filled 2018-06-07: qty 2

## 2018-06-07 MED ORDER — FLUTICASONE FUROATE-VILANTEROL 100-25 MCG/INH IN AEPB
1.0000 | INHALATION_SPRAY | Freq: Every day | RESPIRATORY_TRACT | Status: DC
  Administered 2018-06-08: 1 via RESPIRATORY_TRACT
  Filled 2018-06-07: qty 28

## 2018-06-07 MED ORDER — LAMOTRIGINE 100 MG PO TABS
100.0000 mg | ORAL_TABLET | Freq: Two times a day (BID) | ORAL | Status: DC
  Administered 2018-06-07 – 2018-06-08 (×2): 100 mg via ORAL
  Filled 2018-06-07 (×2): qty 1

## 2018-06-07 MED ORDER — ALPRAZOLAM 0.5 MG PO TABS
0.5000 mg | ORAL_TABLET | Freq: Two times a day (BID) | ORAL | Status: DC
  Administered 2018-06-07 – 2018-06-08 (×2): 0.5 mg via ORAL
  Filled 2018-06-07 (×2): qty 1

## 2018-06-07 MED ORDER — TOPIRAMATE 25 MG PO TABS
50.0000 mg | ORAL_TABLET | Freq: Every morning | ORAL | Status: DC
  Administered 2018-06-08: 50 mg via ORAL
  Filled 2018-06-07: qty 2

## 2018-06-07 MED ORDER — IBUPROFEN 400 MG PO TABS: 800.0000 mg | ORAL_TABLET | Freq: Four times a day (QID) | ORAL | Status: DC | PRN

## 2018-06-07 MED ORDER — ONDANSETRON HCL 4 MG PO TABS: 4.0000 mg | ORAL_TABLET | Freq: Four times a day (QID) | ORAL | Status: DC | PRN

## 2018-06-07 MED ORDER — ENOXAPARIN SODIUM 40 MG/0.4ML ~~LOC~~ SOLN
40.0000 mg | SUBCUTANEOUS | Status: DC
  Administered 2018-06-07: 40 mg via SUBCUTANEOUS
  Filled 2018-06-07: qty 0.4

## 2018-06-07 MED ORDER — DEXTROMETHORPHAN-QUINIDINE 20-10 MG PO CAPS
1.0000 | ORAL_CAPSULE | Freq: Two times a day (BID) | ORAL | Status: DC
  Administered 2018-06-07 – 2018-06-08 (×2): 1 via ORAL
  Filled 2018-06-07 (×2): qty 1

## 2018-06-07 MED ORDER — ONDANSETRON HCL 4 MG/2ML IJ SOLN: 4.0000 mg | Freq: Four times a day (QID) | INTRAMUSCULAR | Status: DC | PRN

## 2018-06-07 MED ORDER — ARIPIPRAZOLE 5 MG PO TABS
5.0000 mg | ORAL_TABLET | Freq: Every day | ORAL | Status: DC
  Administered 2018-06-07: 20:00:00 5 mg via ORAL
  Filled 2018-06-07 (×2): qty 1

## 2018-06-07 MED ORDER — ACETAMINOPHEN 325 MG PO TABS: 650.0000 mg | ORAL_TABLET | Freq: Four times a day (QID) | ORAL | Status: DC | PRN

## 2018-06-07 NOTE — ED Notes (Signed)
Reatha Armour, RN given report.

## 2018-06-07 NOTE — ED Provider Notes (Signed)
[facility] Emergency Department Provider Note ____________________________________________   First MD Initiated Contact with Patient 06/07/18 1200     (approximate)  I have reviewed the triage vital signs and the nursing notes.   HISTORY  Chief Complaint Numbness  HPI [name] Love is a 47 y.o. male with a history of multiple brain tumors status post multiple resections as well as a VP shunt who was presented to the emergency department with 3 to 5 days of right-sided tingling and numbness.  He says that he fell backwards several days ago which is a history of after having his brain lesions.  However, thereafter he says that he has having decreased sensation to the right side of his body from his face all the way down to his feet as well as weakness to the right side.  He says that the decreased sensation manifest itself is tingling in her 20 pushes his fingers together.  He denies any headache.  Says that he also has double vision that appears to be double in the vertical plane.  Says that he is usually stronger on his left side.  Says that his current neurosurgeon is Dr. Christella Noa at [facility].  Past Medical History:  Diagnosis Date  . Abnormal gait   . Allergic rhinitis   . Anxiety   . Bilateral arm weakness   . Brain cancer ([place])   . Depression   . 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 Hearing loss, sensorineural   . Hypothyroidism   . Insomnia   . Left leg weakness   . Low vitamin B12 level   . Seizures [facility])     Patient Active Problem List   Diagnosis Date Noted  . Allergic rhinitis 04/03/2018  . Atypical chest pain 11/30/2017  . Dyspnea on exertion 11/30/2017  . Myalgia 04/18/2017  . Seizures ([place]) 01/20/2017    Past Surgical History:  Procedure Laterality Date  . BRAIN SURGERY    . HERNIA REPAIR    . SHUNT REVISION      Prior to Admission medications   Medication Sig Start Date End Date Taking? Authorizing Provider  ALPRAZolam Duanne Moron) 0.5 MG  tablet Take 0.5 mg by mouth 2 (two) times daily. 05/30/18  Yes [provider]  ARIPiprazole (ABILIFY) 5 MG tablet Take 5 mg by mouth at bedtime.   Yes [provider]  escitalopram (LEXAPRO) 20 MG tablet Take 20 mg by mouth daily.  12/31/16  Yes [provider]  lamoTRIgine (LAMICTAL) 100 MG tablet TAKE 1 TABLET BY MOUTH TWICE DAILY 02/09/18  Yes Kathrynn Ducking, MD  levothyroxine (SYNTHROID, LEVOTHROID) 50 MCG tablet Take 50 mcg by mouth daily before breakfast.  12/25/16  Yes [provider]  NUEDEXTA 20-10 MG CAPS Take 1 capsule by mouth 2 (two) times daily. 12/31/16  Yes [provider]  omega-3 fish oil (MAXEPA) 1000 MG CAPS capsule Take 1 capsule by mouth 2 (two) times daily.   Yes [provider]  omeprazole (PRILOSEC) 20 MG capsule Take 20 mg by mouth daily.   Yes [provider]  topiramate (TOPAMAX) 50 MG tablet TAKE 1 TABLET BY MOUTH IN THE MORNING AND 2 IN THE EVENING Patient taking differently: Take 50-100 mg by mouth 2 (two) times daily. TAKE 1 TABLET BY MOUTH IN THE MORNING AND 2 IN THE EVENING 02/20/18  Yes Kathrynn Ducking, MD  albuterol (PROVENTIL HFA;VENTOLIN HFA) 108 (90 Base) MCG/ACT inhaler Inhale 1-2 puffs into the lungs every 6 (six) hours  as needed for wheezing or shortness of breath. 04/03/18   Rigoberto Noel, MD  fluticasone furoate-vilanterol (BREO ELLIPTA) 100-25 MCG/INH AEPB Inhale 1 puff into the lungs daily. 04/10/18   Rigoberto Noel, MD  HYDROcodone-acetaminophen (NORCO/VICODIN) 5-325 MG tablet Take 1 tablet by mouth every 6 (six) hours as needed for moderate pain. Must last 28 days 06/06/18   Kathrynn Ducking, MD  ibuprofen (ADVIL,MOTRIN) 200 MG tablet Take 800 mg by mouth every 6 (six) hours as needed for mild pain or moderate pain.    [provider]  ranolazine (RANEXA) 1000 MG SR tablet Take 1 tablet (1,000 mg total) by mouth 2 (two) times daily. Patient not taking: Reported on 06/07/2018 12/30/17    Park Liter, MD    Allergies Baclofen; Seroquel [quetiapine]; Sulfa antibiotics; Coconut flavor; and Contrast media [iodinated diagnostic agents]  Family History  Problem Relation Age of Onset  . Brain cancer Mother   . High blood pressure Mother     Social History Social History   Tobacco Use  . Smoking status: Never Smoker  . Smokeless tobacco: Never Used  Substance Use Topics  . Alcohol use: No  . Drug use: No    Review of Systems  Constitutional: No fever/chills Eyes: As above ENT: No sore throat. Cardiovascular: Denies chest pain. Respiratory: Denies shortness of breath. Gastrointestinal: No abdominal pain.  No nausea, no vomiting.  No diarrhea.  No constipation. Genitourinary: Negative for dysuria. Musculoskeletal: Negative for back pain. Skin: Negative for rash. Neurological: Negative for headaches   ____________________________________________   PHYSICAL EXAM:  VITAL SIGNS: ED Triage Vitals  Enc Vitals Group     BP 06/07/18 1055 114/86     Pulse Rate 06/07/18 1052 94     Resp 06/07/18 1052 16     Temp 06/07/18 1052 98.5 F (36.9 C)     Temp Source 06/07/18 1052 Oral     SpO2 06/07/18 1052 96 %     Weight 06/07/18 1056 216 lb (98 kg)     Height 06/07/18 1056 5\' 6"  (1.676 m)     Head Circumference --      Peak Flow --      Pain Score 06/07/18 1059 0     Pain Loc --      Pain Edu? --      Excl. in [place]? --     Constitutional: Alert and oriented. Well appearing and in no acute distress. Eyes: Conjunctivae are normal.  EOMI.  Nystagmus when looking left. Head: Atraumatic.  Shunt tubing palpated.  Unable to palpate a reservoir. Nose: No congestion/rhinnorhea. Mouth/Throat: Mucous membranes are moist.  Neck: No stridor.   Cardiovascular: Normal rate, regular rhythm. Grossly normal heart sounds.   Respiratory: Normal respiratory effort.  No retractions. Lungs CTAB. Gastrointestinal: Soft and nontender. No distention. No CVA  tenderness. Musculoskeletal: No lower extremity tenderness nor edema.  No joint effusions. Neurologic:  Normal speech and language.   Sensation deficit to the right side of his body from the face down to his right lower extremity to light touch.  4 out of 5 strength to the right upper and lower extremities.  No facial asymmetry.  Skin:  Skin is warm, dry and intact. No rash noted. Psychiatric: Mood and affect are normal. Speech and behavior are normal.  ____________________________________________   LABS (all labs ordered are listed, but only abnormal results are displayed)  Labs Reviewed  COMPREHENSIVE METABOLIC PANEL - Abnormal; Notable for the following components:  Result Value   Glucose, Bld 122 (*)    Creatinine, Ser 1.41 (*)    Calcium 8.7 (*)    GFR calc non Af Amer 59 (*)    All other components within normal limits  PROTIME-INR  APTT  TROPONIN I  CBC WITH DIFFERENTIAL/PLATELET  DIFFERENTIAL  CBG MONITORING, ED   ____________________________________________  EKG  ED ECG REPORT I, Doran Stabler, the attending physician, personally viewed and interpreted this ECG.   Date: 06/07/2018  EKG Time: 1052  Rate: 93  Rhythm: normal sinus rhythm  Axis: Normal  Intervals:none  ST&T Change: No ST segment elevation or depression.  No abnormal T wave inversion.  ____________________________________________  RADIOLOGY  New left-sided thalamic stroke.   ____________________________________________   PROCEDURES  Procedure(s) performed:   Procedures  Critical Care performed:   ____________________________________________   INITIAL IMPRESSION / ASSESSMENT AND PLAN / ED COURSE  Pertinent labs & imaging results that were available during my care of the patient were reviewed by me and considered in my medical decision making (see chart for details).  DDX: Shunt malfunction, stroke, concussion, paresthesia, less light abnormality As part of my medical  decision making, I reviewed the following data within the [place] Notes from prior ED visits  ----------------------------------------- 1:19 PM on 06/07/2018 -----------------------------------------  Patient with new left-sided thalamic stroke which correlates with right-sided sensory symptoms.  Discussed case Dr. Doy Mince.  No headache.  No dilated ventricles.  Unlikely to be symptomatic from shunt pathology.  Will be admitted to the hospital.  Signed out to Dr. Verdell Carmine.  Check with patient's care provider, his mother, and she does not know of any contraindications that he has to blood thinners.  We will give a dose of aspirin.   ____________________________________________   FINAL CLINICAL IMPRESSION(S) / ED DIAGNOSES  Thalamic infarct.  NEW MEDICATIONS STARTED DURING THIS VISIT:  New Prescriptions   No medications on file     Note:  This document was prepared using Dragon voice recognition software and may include unintentional dictation errors.     Orbie Pyo, MD 06/07/18 1320

## 2018-06-07 NOTE — Plan of Care (Signed)
  Problem: Education: Goal: Knowledge of disease or condition will improve Outcome: Progressing Goal: Knowledge of secondary prevention will improve Outcome: Progressing   Problem: Coping: Goal: Will verbalize positive feelings about self Outcome: Progressing   Problem: Health Behavior/Discharge Planning: Goal: Ability to manage health-related needs will improve Outcome: Progressing   Problem: Self-Care: Goal: Ability to communicate needs accurately will improve Outcome: Progressing

## 2018-06-07 NOTE — Telephone Encounter (Signed)
I called the patient.  He is in the emergency room currently, he noted onset of right sided numbness affecting the face, arm, and leg 5 days ago, he has had some clumsiness and weakness on that side, he has developed double vision.  The patient will require an evaluation to exclude a stroke event, he does have a history of radiation to the brain.

## 2018-06-07 NOTE — H&P (Signed)
[facility] [place] NAME: Tayjon Halladay    MR#:  [id number]  DATE OF BIRTH:  1971-03-19  DATE OF ADMISSION:  06/07/2018  PRIMARY CARE PHYSICIAN: Isaias Sakai, DO   REQUESTING/REFERRING PHYSICIAN: Dr. Larae Grooms  CHIEF COMPLAINT:   Chief Complaint  Patient presents with  . Numbness    HISTORY OF PRESENT ILLNESS:  Yehuda Printup  is a 47 y.o. 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  Patient says he fell about 5 days ago and developed some numbness and weakness to his right side and thought it was related to his fall.  He has also been complaining of double vision but that has been going on for months and he has seen an ophthalmologist who has referred him to another specialist in [place].  His numbness although began 3 days ago and therefore he was a bit concerned and came to the ER for further evaluation.  Patient denies any headache, fever, chills or any other associated symptoms.  He does have some nausea and some intermittent vomiting which has now resolved.  Patient underwent a CT of his head which was positive for acute thalamic CVA.  Hospitalist services were contacted for admission.  PAST MEDICAL HISTORY:   Past Medical History:  Diagnosis Date  . Abnormal gait   . Allergic rhinitis   . Anxiety   . Bilateral arm weakness   . Brain cancer ([place])   . Depression   . Ependymoma of brain ([place])   . Hearing loss, sensorineural   . Hypothyroidism   . Insomnia   . Left leg weakness   . Low vitamin B12 level   . Seizures ([place])     PAST SURGICAL HISTORY:   Past Surgical History:  Procedure Laterality Date  . BRAIN SURGERY    . HERNIA REPAIR    . SHUNT REVISION      SOCIAL HISTORY:   Social History   Tobacco Use  . Smoking status:  Never Smoker  . Smokeless tobacco: Never Used  Substance Use Topics  . Alcohol use: No    FAMILY HISTORY:   Family History  Problem Relation Age of Onset  . Brain cancer Mother   . High blood pressure Mother     DRUG ALLERGIES:   Allergies  Allergen Reactions  . Baclofen     Urinary retension  . Seroquel [Quetiapine]     Weight gain  . Sulfa Antibiotics   . Coconut Flavor Rash    ANYTHING COCONUT  . Contrast Media [Iodinated Diagnostic Agents] Rash    REVIEW OF SYSTEMS:   Review of Systems  Constitutional: Negative for fever and weight loss.  HENT: Negative for congestion, nosebleeds and tinnitus.   Eyes: Positive for double vision. Negative for blurred vision and redness.  Respiratory: Negative for cough, hemoptysis and shortness of breath.   Cardiovascular: Negative for chest pain, orthopnea, leg swelling and PND.  Gastrointestinal: Positive for nausea. Negative for abdominal pain, diarrhea, melena and vomiting.  Genitourinary: Negative for dysuria, hematuria and urgency.  Musculoskeletal: Negative for falls and joint pain.  Neurological: Positive for sensory change (right sided weakness. ). Negative for dizziness, tingling, focal weakness, seizures, weakness and headaches.  Endo/Heme/Allergies: Negative for polydipsia. Does not bruise/bleed easily.  Psychiatric/Behavioral: Negative for depression and memory loss.  The patient is not nervous/anxious.     MEDICATIONS AT HOME:   Prior to Admission medications   Medication Sig Start Date End Date Taking? Authorizing Provider  ALPRAZolam Duanne Moron) 0.5 MG tablet Take 0.5 mg by mouth 2 (two) times daily. 05/30/18  Yes [provider]  ARIPiprazole (ABILIFY) 5 MG tablet Take 5 mg by mouth at bedtime.   Yes [provider]  escitalopram (LEXAPRO) 20 MG tablet Take 20 mg by mouth daily.  12/31/16  Yes [provider]  lamoTRIgine (LAMICTAL) 100 MG tablet TAKE 1 TABLET BY MOUTH TWICE DAILY 02/09/18  Yes  Kathrynn Ducking, MD  levothyroxine (SYNTHROID, LEVOTHROID) 50 MCG tablet Take 50 mcg by mouth daily before breakfast.  12/25/16  Yes [provider]  NUEDEXTA 20-10 MG CAPS Take 1 capsule by mouth 2 (two) times daily. 12/31/16  Yes [provider]  omega-3 fish oil (MAXEPA) 1000 MG CAPS capsule Take 1 capsule by mouth 2 (two) times daily.   Yes [provider]  omeprazole (PRILOSEC) 20 MG capsule Take 20 mg by mouth daily.   Yes [provider]  topiramate (TOPAMAX) 50 MG tablet TAKE 1 TABLET BY MOUTH IN THE MORNING AND 2 IN THE EVENING Patient taking differently: Take 50-100 mg by mouth 2 (two) times daily. TAKE 1 TABLET BY MOUTH IN THE MORNING AND 2 IN THE EVENING 02/20/18  Yes Kathrynn Ducking, MD  albuterol (PROVENTIL HFA;VENTOLIN HFA) 108 (90 Base) MCG/ACT inhaler Inhale 1-2 puffs into the lungs every 6 (six) hours as needed for wheezing or shortness of breath. 04/03/18   Rigoberto Noel, MD  fluticasone furoate-vilanterol (BREO ELLIPTA) 100-25 MCG/INH AEPB Inhale 1 puff into the lungs daily. 04/10/18   Rigoberto Noel, MD  HYDROcodone-acetaminophen (NORCO/VICODIN) 5-325 MG tablet Take 1 tablet by mouth every 6 (six) hours as needed for moderate pain. Must last 28 days 06/06/18   Kathrynn Ducking, MD  ibuprofen (ADVIL,MOTRIN) 200 MG tablet Take 800 mg by mouth every 6 (six) hours as needed for mild pain or moderate pain.    [provider]  ranolazine (RANEXA) 1000 MG SR tablet Take 1 tablet (1,000 mg total) by mouth 2 (two) times daily. Patient not taking: Reported on 06/07/2018 12/30/17   Park Liter, MD      VITAL SIGNS:  Blood pressure 114/86, pulse 94, temperature 98.5 F (36.9 C), temperature source Oral, resp. rate 16, height 5\' 6"  (1.676 m), weight 98 kg, SpO2 97 %.  PHYSICAL EXAMINATION:  Physical Exam  GENERAL:  47 y.o.-year-old patient lying in the bed in no acute distress.  EYES: Pupils equal, round, reactive to light and  accommodation. No scleral icterus. Extraocular muscles intact.  HEENT: Head atraumatic, normocephalic. Oropharynx and nasopharynx clear. No oropharyngeal erythema, moist oral mucosa  NECK:  Supple, no jugular venous distention. No thyroid enlargement, no tenderness.  LUNGS: Normal breath sounds bilaterally, no wheezing, rales, rhonchi. No use of accessory muscles of respiration.  CARDIOVASCULAR: S1, S2 RRR. No murmurs, rubs, gallops, clicks.  ABDOMEN: Soft, nontender, nondistended. Bowel sounds present. No organomegaly or mass.  EXTREMITIES: No pedal edema, cyanosis, or clubbing. + 2 pedal & radial pulses b/l.   NEUROLOGIC: Cranial nerves II through XII are intact.  4 out of 5 strength in the right upper/lower extremity and numbness in the right upper and lower extremity compared to the left. PSYCHIATRIC: The patient is alert and oriented x 3.   SKIN: No obvious rash, lesion, or ulcer.  LABORATORY PANEL:   CBC Recent Labs  Lab 06/07/18 1127  WBC 8.2  HGB 15.7  HCT 45.3  PLT 240   ------------------------------------------------------------------------------------------------------------------  Chemistries  Recent Labs  Lab 06/07/18 1102  NA 138  K 3.5  CL 107  CO2 22  GLUCOSE 122*  BUN 13  CREATININE 1.41*  CALCIUM 8.7*  AST 21  ALT 17  ALKPHOS 95  BILITOT 0.6   ------------------------------------------------------------------------------------------------------------------  Cardiac Enzymes Recent Labs  Lab 06/07/18 1102  TROPONINI <0.03   ------------------------------------------------------------------------------------------------------------------  RADIOLOGY:  Dg Skull 1-3 Views  Result Date: 06/07/2018 CLINICAL DATA:  47 year old with indwelling ventriculovenous shunt who presents with RIGHT facial and body numbness that began approximately 5 days ago associated with mild weakness and diplopia. Evaluate the continuity of the shunt catheter. EXAM: SKULL -  1-3 VIEW CHEST  1 VIEW ABDOMEN - 1 VIEW COMPARISON:  CT head earlier same day and previously. Chest x-ray 04/03/2018 and earlier. Abdominal x-rays 11/14/2012. FINDINGS: Ventriculovenous shunt catheter entering from a RIGHT parietal approach with its tip across the midline as noted on the earlier CT head. Shunt catheter tubing appears continuous in the head and neck. No focal osseous abnormalities involving the skull. Paranasal sinuses well aerated. Ventriculovenous shunt catheter with its tip overlying the LOWER SVC, unchanged. The shunt catheter appears intact. Cardiomediastinal silhouette unremarkable, unchanged. Lungs clear. Bronchovascular markings normal. Pulmonary vascularity normal. No visible pleural effusions. No pneumothorax. Bowel gas pattern unremarkable without evidence of obstruction or significant ileus. Moderate stool burden in the colon. Phlebolith low in the RIGHT side of the pelvis. No visible opaque urinary tract calculi. IMPRESSION: 1. Ventriculovenous shunt catheter tubing appears intact with its tip in the LOWER SVC, unchanged. 2.  No acute cardiopulmonary disease. 3. No acute abdominal abnormality. . Electronically Signed   By: Evangeline Dakin M.D.   On: 06/07/2018 13:20   Dg Chest 1 View  Result Date: 06/07/2018 CLINICAL DATA:  47 year old with indwelling ventriculovenous shunt who presents with RIGHT facial and body numbness that began approximately 5 days ago associated with mild weakness and diplopia. Evaluate the continuity of the shunt catheter. EXAM: SKULL - 1-3 VIEW CHEST  1 VIEW ABDOMEN - 1 VIEW COMPARISON:  CT head earlier same day and previously. Chest x-ray 04/03/2018 and earlier. Abdominal x-rays 11/14/2012. FINDINGS: Ventriculovenous shunt catheter entering from a RIGHT parietal approach with its tip across the midline as noted on the earlier CT head. Shunt catheter tubing appears continuous in the head and neck. No focal osseous abnormalities involving the skull. Paranasal  sinuses well aerated. Ventriculovenous shunt catheter with its tip overlying the LOWER SVC, unchanged. The shunt catheter appears intact. Cardiomediastinal silhouette unremarkable, unchanged. Lungs clear. Bronchovascular markings normal. Pulmonary vascularity normal. No visible pleural effusions. No pneumothorax. Bowel gas pattern unremarkable without evidence of obstruction or significant ileus. Moderate stool burden in the colon. Phlebolith low in the RIGHT side of the pelvis. No visible opaque urinary tract calculi. IMPRESSION: 1. Ventriculovenous shunt catheter tubing appears intact with its tip in the LOWER SVC, unchanged. 2.  No acute cardiopulmonary disease. 3. No acute abdominal abnormality. . Electronically Signed   By: Evangeline Dakin M.D.   On: 06/07/2018 13:20   Dg Abd 1 View  Result Date: 06/07/2018 CLINICAL DATA:  47 year old with indwelling ventriculovenous shunt who presents with RIGHT facial and body numbness that began approximately 5 days ago associated with mild weakness and diplopia. Evaluate the continuity of the shunt catheter. EXAM: SKULL - 1-3 VIEW CHEST  1 VIEW  ABDOMEN - 1 VIEW COMPARISON:  CT head earlier same day and previously. Chest x-ray 04/03/2018 and earlier. Abdominal x-rays 11/14/2012. FINDINGS: Ventriculovenous shunt catheter entering from a RIGHT parietal approach with its tip across the midline as noted on the earlier CT head. Shunt catheter tubing appears continuous in the head and neck. No focal osseous abnormalities involving the skull. Paranasal sinuses well aerated. Ventriculovenous shunt catheter with its tip overlying the LOWER SVC, unchanged. The shunt catheter appears intact. Cardiomediastinal silhouette unremarkable, unchanged. Lungs clear. Bronchovascular markings normal. Pulmonary vascularity normal. No visible pleural effusions. No pneumothorax. Bowel gas pattern unremarkable without evidence of obstruction or significant ileus. Moderate stool burden in the  colon. Phlebolith low in the RIGHT side of the pelvis. No visible opaque urinary tract calculi. IMPRESSION: 1. Ventriculovenous shunt catheter tubing appears intact with its tip in the LOWER SVC, unchanged. 2.  No acute cardiopulmonary disease. 3. No acute abdominal abnormality. . Electronically Signed   By: Evangeline Dakin M.D.   On: 06/07/2018 13:20   Ct Head Wo Contrast  Result Date: 06/07/2018 CLINICAL DATA:  Right-sided body numbness for 5 days. Double vision. History of brain tumor and VP shunt. EXAM: CT HEAD WITHOUT CONTRAST TECHNIQUE: Contiguous axial images were obtained from the base of the skull through the vertex without intravenous contrast. COMPARISON:  10/03/2017 FINDINGS: Brain: Coarse calcifications in the cerebellum and to a lesser extent occipital lobes and pons are unchanged and likely related to remote radiation therapy. A right parietal approach ventriculostomy catheter terminates at the level of the left caudate nucleus, unchanged. The ventricles are unchanged in size and configuration without dilatation. Linear calcification in the right frontal lobe is unchanged and may be related to a prior ventriculostomy catheter. Chronic lacunar infarcts are again seen in the right thalamus and basal ganglia. A lacunar infarct in the posterior inferior left thalamus is new. No acute large territory infarct, intracranial hemorrhage, or midline shift is identified. Low-density subdural fluid collections are again seen bilaterally measuring up to 3 mm in thickness on the right and 4 mm on the left with the left-sided collection being smaller than on the prior study. Vascular: No hyperdense vessel. Skull: Suboccipital craniectomy. Sinuses/Orbits: Visualized paranasal sinuses and mastoid air cells are clear. Visualized orbits are unremarkable. Other: None. IMPRESSION: 1. New left thalamic lacunar infarct. 2. No evidence of acute large territory infarct or intracranial hemorrhage. 3. Unchanged appearance  of the ventricles and VP shunt catheter. 4. Small chronic bilateral subdural fluid collections, slightly decreased in size on the left. Electronically Signed   By: Logan Bores M.D.   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  1.  Acute CVA-this is a cause of patient's right-sided weakness/numbness.  Patient on CT head was noted to have a new left thalamic infarct. - Start patient on aspirin, will get an MRI of the brain, carotid duplex, echocardiogram. -We will also get a neurology consult, physical therapy, occupational therapy evaluation.  2.  Diplopia-this is been ongoing for a long time, unlikely this is related to the acute stroke. - Patient was advised to follow-up with his ophthalmologist/neurosurgeon.  3.  History of seizures- no acute seizure type activity.  Continue Lamictal, Topamax.  4.  Anxiety/depression-continue Xanax, Lexapro, Abilify..  5.  Hypothyroidism-continue  Synthroid.  6.  GERD-continue Protonix.    All the records are reviewed and case discussed with ED provider. Management plans discussed with the patient, family and they are in agreement.  CODE STATUS: Full code  TOTAL TIME TAKING CARE OF THIS PATIENT: 40 minutes.    Henreitta Leber M.D on 06/07/2018 at 2:34 PM  Between 7am to 6pm - Pager - [phone]  After 6pm go to [email or web address] - password [email or web address]  Office  [phone]  CC: Primary care physician; Isaias Sakai, DO

## 2018-06-07 NOTE — ED Triage Notes (Signed)
Pt having numbness on his right side from his head to his feet that began 5 days ago, was thinking it was because he fell and hit his head a few days ago, cousin states unrelated to the fall. Came here to be seen because some people made him think he should come in to be checked. States he feels "needles" in his fingers. Face symmetrical, speech clear.

## 2018-06-07 NOTE — Progress Notes (Signed)
Patient admitted to room 118 from ED with CVA. Patient alert oriented x 4, c/o numbness on his right side. Patient in bed locked at lower position bedside table, call light and telephone at bedside. Will continue to monitor patient.

## 2018-06-07 NOTE — Telephone Encounter (Signed)
Review of the CT scan of the brain shows a new left thalamic stroke that explains his right hemisensory deficit.

## 2018-06-07 NOTE — Telephone Encounter (Signed)
Pt called stating he needed to schedule an appointment, pt stated he felt like he has had a stroke.  Pt states he is numb on the entire right side of his body from his head, eye nose and down to his feet.  Pt was advised to go to ED.  Pt states that he will, pt was informed RN and Dr Jannifer Franklin would be made aware.

## 2018-06-07 NOTE — ED Notes (Signed)
Pt provided with warm blanket. 

## 2018-06-07 NOTE — ED Notes (Addendum)
First Nurse Note: Patient complaining of numbness on right side of body starting 5 days ago.  Speech is clear.  Complaining of double vision and feeling of heaviness. Alert and oriented.  Sitting in [place].  Triage Nurse notified.

## 2018-06-08 ENCOUNTER — Inpatient Hospital Stay: Payer: Medicare Other

## 2018-06-08 ENCOUNTER — Inpatient Hospital Stay (HOSPITAL_COMMUNITY)
Admission: AD | Admit: 2018-06-08 | Discharge: 2018-06-11 | DRG: 066 | Disposition: A | Discharge status: Home Health | Payer: Medicare Other | Source: Other Acute Inpatient Hospital | Attending: Internal Medicine | Admitting: Internal Medicine

## 2018-06-08 ENCOUNTER — Inpatient Hospital Stay (HOSPITAL_COMMUNITY)
Admit: 2018-06-08 | Discharge: 2018-06-08 | Disposition: A | Discharge status: Home / Self Care | Payer: Medicare Other | Attending: Specialist | Admitting: Specialist

## 2018-06-08 DIAGNOSIS — E538 Deficiency of other specified B group vitamins: Secondary | ICD-10-CM | POA: Diagnosis present

## 2018-06-08 DIAGNOSIS — Z85841 Personal history of malignant neoplasm of brain: Secondary | ICD-10-CM

## 2018-06-08 DIAGNOSIS — Z888 Allergy status to other drugs, medicaments and biological substances status: Secondary | ICD-10-CM | POA: Diagnosis not present

## 2018-06-08 DIAGNOSIS — R51 Headache: Secondary | ICD-10-CM | POA: Diagnosis not present

## 2018-06-08 DIAGNOSIS — I6381 Other cerebral infarction due to occlusion or stenosis of small artery: Secondary | ICD-10-CM | POA: Diagnosis not present

## 2018-06-08 DIAGNOSIS — I639 Cerebral infarction, unspecified: Secondary | ICD-10-CM | POA: Diagnosis not present

## 2018-06-08 DIAGNOSIS — Z91018 Allergy to other foods: Secondary | ICD-10-CM

## 2018-06-08 DIAGNOSIS — I739 Peripheral vascular disease, unspecified: Secondary | ICD-10-CM | POA: Diagnosis present

## 2018-06-08 DIAGNOSIS — R569 Unspecified convulsions: Secondary | ICD-10-CM | POA: Diagnosis not present

## 2018-06-08 DIAGNOSIS — I63233 Cerebral infarction due to unspecified occlusion or stenosis of bilateral carotid arteries: Secondary | ICD-10-CM | POA: Diagnosis not present

## 2018-06-08 DIAGNOSIS — Z8249 Family history of ischemic heart disease and other diseases of the circulatory system: Secondary | ICD-10-CM

## 2018-06-08 DIAGNOSIS — E039 Hypothyroidism, unspecified: Secondary | ICD-10-CM | POA: Diagnosis present

## 2018-06-08 DIAGNOSIS — Z91041 Radiographic dye allergy status: Secondary | ICD-10-CM

## 2018-06-08 DIAGNOSIS — I63219 Cerebral infarction due to unspecified occlusion or stenosis of unspecified vertebral arteries: Secondary | ICD-10-CM | POA: Diagnosis present

## 2018-06-08 DIAGNOSIS — R0609 Other forms of dyspnea: Secondary | ICD-10-CM

## 2018-06-08 DIAGNOSIS — G4700 Insomnia, unspecified: Secondary | ICD-10-CM | POA: Diagnosis present

## 2018-06-08 DIAGNOSIS — M6281 Muscle weakness (generalized): Secondary | ICD-10-CM | POA: Diagnosis not present

## 2018-06-08 DIAGNOSIS — H532 Diplopia: Secondary | ICD-10-CM | POA: Diagnosis not present

## 2018-06-08 DIAGNOSIS — F329 Major depressive disorder, single episode, unspecified: Secondary | ICD-10-CM | POA: Diagnosis present

## 2018-06-08 DIAGNOSIS — Z982 Presence of cerebrospinal fluid drainage device: Secondary | ICD-10-CM

## 2018-06-08 DIAGNOSIS — Z79890 Hormone replacement therapy (postmenopausal): Secondary | ICD-10-CM

## 2018-06-08 DIAGNOSIS — E785 Hyperlipidemia, unspecified: Secondary | ICD-10-CM | POA: Diagnosis present

## 2018-06-08 DIAGNOSIS — R269 Unspecified abnormalities of gait and mobility: Secondary | ICD-10-CM | POA: Diagnosis present

## 2018-06-08 DIAGNOSIS — Z882 Allergy status to sulfonamides status: Secondary | ICD-10-CM

## 2018-06-08 DIAGNOSIS — F79 Unspecified intellectual disabilities: Secondary | ICD-10-CM | POA: Diagnosis not present

## 2018-06-08 DIAGNOSIS — H903 Sensorineural hearing loss, bilateral: Secondary | ICD-10-CM | POA: Diagnosis not present

## 2018-06-08 DIAGNOSIS — Z791 Long term (current) use of non-steroidal anti-inflammatories (NSAID): Secondary | ICD-10-CM | POA: Diagnosis not present

## 2018-06-08 DIAGNOSIS — G40909 Epilepsy, unspecified, not intractable, without status epilepticus: Secondary | ICD-10-CM | POA: Diagnosis not present

## 2018-06-08 DIAGNOSIS — Z808 Family history of malignant neoplasm of other organs or systems: Secondary | ICD-10-CM

## 2018-06-08 DIAGNOSIS — D332 Benign neoplasm of brain, unspecified: Secondary | ICD-10-CM | POA: Diagnosis not present

## 2018-06-08 DIAGNOSIS — Z79899 Other long term (current) drug therapy: Secondary | ICD-10-CM

## 2018-06-08 DIAGNOSIS — J45909 Unspecified asthma, uncomplicated: Secondary | ICD-10-CM | POA: Diagnosis not present

## 2018-06-08 DIAGNOSIS — R296 Repeated falls: Secondary | ICD-10-CM | POA: Diagnosis not present

## 2018-06-08 DIAGNOSIS — F419 Anxiety disorder, unspecified: Secondary | ICD-10-CM | POA: Diagnosis not present

## 2018-06-08 DIAGNOSIS — I6322 Cerebral infarction due to unspecified occlusion or stenosis of basilar arteries: Secondary | ICD-10-CM

## 2018-06-08 DIAGNOSIS — Z7982 Long term (current) use of aspirin: Secondary | ICD-10-CM

## 2018-06-08 DIAGNOSIS — E876 Hypokalemia: Secondary | ICD-10-CM | POA: Diagnosis present

## 2018-06-08 DIAGNOSIS — R200 Anesthesia of skin: Secondary | ICD-10-CM | POA: Diagnosis not present

## 2018-06-08 DIAGNOSIS — R29701 NIHSS score 1: Secondary | ICD-10-CM | POA: Diagnosis present

## 2018-06-08 HISTORY — DX: Cerebral infarction due to unspecified occlusion or stenosis of basilar artery: I63.219

## 2018-06-08 HISTORY — DX: Other cerebral infarction due to occlusion or stenosis of small artery: I63.81

## 2018-06-08 LAB — CBC
HCT: 42.7 % (ref 40.0–52.0)
HCT: 46.3 % (ref 39.0–52.0)
Hemoglobin: 14.8 g/dL (ref 13.0–18.0)
Hemoglobin: 15.5 g/dL (ref 13.0–17.0)
MCH: 32.3 pg (ref 26.0–34.0)
MCH: 31.5 pg (ref 26.0–34.0)
MCHC: 34.6 g/dL (ref 32.0–36.0)
MCHC: 33.5 g/dL (ref 30.0–36.0)
MCV: 93.5 fL (ref 80.0–100.0)
MCV: 94.1 fL (ref 78.0–100.0)
Platelets: 213 10*3/uL (ref 150–440)
Platelets: 243 10*3/uL (ref 150–400)
RBC: 4.57 MIL/uL (ref 4.40–5.90)
RBC: 4.92 MIL/uL (ref 4.22–5.81)
RDW: 13.1 % (ref 11.5–14.5)
RDW: 12.3 % (ref 11.5–15.5)
WBC: 7.2 10*3/uL (ref 3.8–10.6)
WBC: 8.2 10*3/uL (ref 4.0–10.5)

## 2018-06-08 LAB — BASIC METABOLIC PANEL
Anion gap: 7 (ref 5–15)
BUN: 13 mg/dL (ref 6–20)
CHLORIDE: 108 mmol/L (ref 98–111)
CO2: 26 mmol/L (ref 22–32)
Calcium: 8.6 mg/dL — ABNORMAL LOW (ref 8.9–10.3)
Creatinine, Ser: 1.3 mg/dL — ABNORMAL HIGH (ref 0.61–1.24)
GFR calc Af Amer: >60 mL/min (ref >60)
GFR calc non Af Amer: >60 mL/min (ref >60)
Glucose, Bld: 105 mg/dL — ABNORMAL HIGH (ref 70–99)
Potassium: 3.3 mmol/L — ABNORMAL LOW (ref 3.5–5.1)
SODIUM: 141 mmol/L (ref 135–145)

## 2018-06-08 LAB — ECHOCARDIOGRAM COMPLETE
Height: 66 in
Weight: 3456 oz

## 2018-06-08 LAB — LIPID PANEL
CHOLESTEROL: 151 mg/dL (ref 0–200)
HDL: 27 mg/dL — AB (ref >40)
LDL Cholesterol: 69 mg/dL (ref 0–99)
TRIGLYCERIDES: 277 mg/dL — AB (ref <150)
Total CHOL/HDL Ratio: 5.6 RATIO
VLDL: 55 mg/dL — ABNORMAL HIGH (ref 0–40)

## 2018-06-08 LAB — HEMOGLOBIN A1C
Hgb A1c MFr Bld: 5.4 % (ref 4.8–5.6)
Mean Plasma Glucose: 108.28 mg/dL

## 2018-06-08 LAB — CREATININE, SERUM
Creatinine, Ser: 1.43 mg/dL — ABNORMAL HIGH (ref 0.61–1.24)
GFR calc Af Amer: >60 mL/min (ref >60)
GFR calc non Af Amer: 58 mL/min — ABNORMAL LOW (ref >60)

## 2018-06-08 MED ORDER — OMEGA-3-ACID ETHYL ESTERS 1 G PO CAPS
1.0000 g | ORAL_CAPSULE | Freq: Two times a day (BID) | ORAL | Status: DC
  Administered 2018-06-08 – 2018-06-11 (×6): 1 g via ORAL
  Filled 2018-06-08 (×6): qty 1

## 2018-06-08 MED ORDER — PANTOPRAZOLE SODIUM 40 MG PO TBEC
40.0000 mg | DELAYED_RELEASE_TABLET | Freq: Every day | ORAL | Status: DC
  Administered 2018-06-09 – 2018-06-11 (×3): 40 mg via ORAL
  Filled 2018-06-08 (×3): qty 1

## 2018-06-08 MED ORDER — TOPIRAMATE 25 MG PO TABS
50.0000 mg | ORAL_TABLET | Freq: Every morning | ORAL | Status: DC
  Administered 2018-06-09 – 2018-06-11 (×3): 50 mg via ORAL
  Filled 2018-06-08 (×3): qty 2

## 2018-06-08 MED ORDER — LEVOTHYROXINE SODIUM 50 MCG PO TABS
50.0000 ug | ORAL_TABLET | Freq: Every day | ORAL | Status: DC
  Administered 2018-06-09 – 2018-06-11 (×3): 50 ug via ORAL
  Filled 2018-06-08 (×3): qty 1

## 2018-06-08 MED ORDER — CALCIUM POLYCARBOPHIL 625 MG PO TABS
1250.0000 mg | ORAL_TABLET | Freq: Every day | ORAL | Status: DC
  Administered 2018-06-10: 1250 mg via ORAL
  Filled 2018-06-08 (×4): qty 2

## 2018-06-08 MED ORDER — FLUTICASONE FUROATE-VILANTEROL 100-25 MCG/INH IN AEPB
1.0000 | INHALATION_SPRAY | Freq: Every day | RESPIRATORY_TRACT | Status: DC
  Administered 2018-06-09: 1 via RESPIRATORY_TRACT
  Filled 2018-06-08 (×2): qty 28

## 2018-06-08 MED ORDER — TOPIRAMATE 25 MG PO TABS: 50.0000 mg | ORAL_TABLET | Freq: Two times a day (BID) | ORAL | Status: DC

## 2018-06-08 MED ORDER — ALBUTEROL SULFATE (2.5 MG/3ML) 0.083% IN NEBU: 2.5000 mg | INHALATION_SOLUTION | Freq: Four times a day (QID) | RESPIRATORY_TRACT | Status: DC | PRN

## 2018-06-08 MED ORDER — ASPIRIN EC 325 MG PO TBEC
325.0000 mg | DELAYED_RELEASE_TABLET | Freq: Every day | ORAL | Status: DC
  Administered 2018-06-09: 325 mg via ORAL
  Filled 2018-06-08: qty 1

## 2018-06-08 MED ORDER — OMEGA-3 FISH OIL 1000 MG PO CAPS: 1.0000 | ORAL_CAPSULE | Freq: Two times a day (BID) | ORAL | Status: DC

## 2018-06-08 MED ORDER — POTASSIUM CHLORIDE CRYS ER 20 MEQ PO TBCR
40.0000 meq | EXTENDED_RELEASE_TABLET | Freq: Once | ORAL | Status: AC
  Administered 2018-06-08: 40 meq via ORAL
  Filled 2018-06-08: qty 2

## 2018-06-08 MED ORDER — ESCITALOPRAM OXALATE 10 MG PO TABS
20.0000 mg | ORAL_TABLET | Freq: Every day | ORAL | Status: DC
  Administered 2018-06-09 – 2018-06-11 (×3): 20 mg via ORAL
  Filled 2018-06-08 (×3): qty 2

## 2018-06-08 MED ORDER — ROSUVASTATIN CALCIUM 20 MG PO TABS
20.0000 mg | ORAL_TABLET | Freq: Every day | ORAL | Status: DC
  Administered 2018-06-09 – 2018-06-10 (×2): 20 mg via ORAL
  Filled 2018-06-08 (×3): qty 1

## 2018-06-08 MED ORDER — ROSUVASTATIN CALCIUM 20 MG PO TABS
20.0000 mg | ORAL_TABLET | Freq: Every day | ORAL | Status: DC
  Administered 2018-06-08: 20 mg via ORAL
  Filled 2018-06-08: qty 1

## 2018-06-08 MED ORDER — ENOXAPARIN SODIUM 40 MG/0.4ML ~~LOC~~ SOLN
40.0000 mg | SUBCUTANEOUS | Status: DC
  Administered 2018-06-08 – 2018-06-11 (×3): 40 mg via SUBCUTANEOUS
  Filled 2018-06-08 (×3): qty 0.4

## 2018-06-08 MED ORDER — ROSUVASTATIN CALCIUM 20 MG PO TABS: 20.0000 mg | ORAL_TABLET | Freq: Every day | ORAL | Status: DC

## 2018-06-08 MED ORDER — ASPIRIN 325 MG PO TBEC: 325.0000 mg | DELAYED_RELEASE_TABLET | Freq: Every day | ORAL | 0 refills | Status: DC

## 2018-06-08 MED ORDER — ARIPIPRAZOLE 10 MG PO TABS
5.0000 mg | ORAL_TABLET | Freq: Every day | ORAL | Status: DC
  Administered 2018-06-08 – 2018-06-10 (×3): 5 mg via ORAL
  Filled 2018-06-08 (×3): qty 1

## 2018-06-08 MED ORDER — DEXTROMETHORPHAN-QUINIDINE 20-10 MG PO CAPS
1.0000 | ORAL_CAPSULE | Freq: Two times a day (BID) | ORAL | Status: DC
  Administered 2018-06-08 – 2018-06-11 (×5): 1 via ORAL
  Filled 2018-06-08 (×6): qty 1

## 2018-06-08 MED ORDER — TOPIRAMATE 100 MG PO TABS
100.0000 mg | ORAL_TABLET | Freq: Every evening | ORAL | Status: DC
  Administered 2018-06-08 – 2018-06-10 (×3): 100 mg via ORAL
  Filled 2018-06-08 (×3): qty 1

## 2018-06-08 MED ORDER — LAMOTRIGINE 100 MG PO TABS
100.0000 mg | ORAL_TABLET | Freq: Two times a day (BID) | ORAL | Status: DC
  Administered 2018-06-08 – 2018-06-11 (×6): 100 mg via ORAL
  Filled 2018-06-08 (×6): qty 1

## 2018-06-08 MED ORDER — ALPRAZOLAM 0.5 MG PO TABS
0.5000 mg | ORAL_TABLET | Freq: Two times a day (BID) | ORAL | Status: DC
  Administered 2018-06-08 – 2018-06-11 (×6): 0.5 mg via ORAL
  Filled 2018-06-08 (×6): qty 1

## 2018-06-08 NOTE — Consult Note (Signed)
Referring Physician: Henreitta Leber, MD    Chief Complaint: right side numbness and double vision  HPI: [name] Love is an 47 y.o. male with history of posterior fossa ependymoma resection in 1995, multiple revisions since then, including VP shunt/revision, seizures controlled on Lamictal and Topamax, anxiety, abnormal gait causing multiple falls, and the rest as below. He present to the ED on 06/07/2018 with complaints of right side numbness and weakness starting 4 days ago. He fell 3 days prior to onset of his symptoms so he thought that the numbness and weakness may be due to the fall causing damage to his shunt. He is currently being followed by Neurosurgery Dr. Christella Noa at [place].  He denies other associated altered sensorium,  focal neurological deficits, headache, vision loss, jaw pain, neck stiffness, nausea or vomiting, syncope or LOC. He however endorses double vision that has been present for several months. Patient state that his vision is blurry, he is seeing double objects stacked up on top of each other. He state that his vision would correct if he closes one eye but remains blurry. He describes associated symptoms of imbalance, when he tried to walk, he seemed to be swaying to one side causing him to fall. He has been evaluated by ophthalmologist who referred him to [facility] to see a neuro-ophthalmologist. He denies seizures or seizure like episodes, he knows when he is about to have a seizure. Patient state he has not had a seizure in a long time. CT head done in the ED on arrival  showed left thalamic infarct, so he was admitted for further stroke work up.  Date last known well: Unable to determine  Time last known well: Unable to determine tPA Given: No: Unable to determine time last known well as symptoms started over 5 days ago  Past Medical History:  Diagnosis Date  . Abnormal gait   . Allergic rhinitis   . Anxiety   . Bilateral arm weakness   . Brain cancer ([place])   .  Depression   . Ependymoma of brain ([place])   . Hearing loss, sensorineural   . Hypothyroidism   . Insomnia   . Left leg weakness   . Low vitamin B12 level   . Seizures ([place])     Past Surgical History:  Procedure Laterality Date  . BRAIN SURGERY    . HERNIA REPAIR    . SHUNT REVISION      Family History  Problem Relation Age of Onset  . Brain cancer Mother   . High blood pressure Mother    Social History:  reports that he has never smoked. He has never used smokeless tobacco. He reports that he does not drink alcohol or use drugs.  Allergies:  Allergies  Allergen Reactions  . Baclofen     Urinary retension  . Seroquel [Quetiapine]     Weight gain  . Sulfa Antibiotics   . Coconut Flavor Rash    ANYTHING COCONUT  . Contrast Media [Iodinated Diagnostic Agents] Rash    Medications:  I have reviewed the patient's current medications. Prior to Admission:  Medications Prior to Admission  Medication Sig Dispense Refill Last Dose  . ALPRAZolam (XANAX) 0.5 MG tablet Take 0.5 mg by mouth 2 (two) times daily.  2 06/07/2018 at Unknown time  . ARIPiprazole (ABILIFY) 5 MG tablet Take 5 mg by mouth at bedtime.   06/06/2018 at Unknown time  . escitalopram (LEXAPRO) 20 MG tablet Take 20 mg by mouth daily.  2 06/07/2018 at Unknown time  . lamoTRIgine (LAMICTAL) 100 MG tablet TAKE 1 TABLET BY MOUTH TWICE DAILY 180 tablet 1 06/07/2018 at Unknown time  . levothyroxine (SYNTHROID, LEVOTHROID) 50 MCG tablet Take 50 mcg by mouth daily before breakfast.   3 06/07/2018 at Unknown time  . NUEDEXTA 20-10 MG CAPS Take 1 capsule by mouth 2 (two) times daily.  3 06/07/2018 at Unknown time  . omega-3 fish oil (MAXEPA) 1000 MG CAPS capsule Take 1 capsule by mouth 2 (two) times daily.   06/07/2018 at Unknown time  . omeprazole (PRILOSEC) 20 MG capsule Take 20 mg by mouth daily.   06/07/2018 at Unknown time  . topiramate (TOPAMAX) 50 MG tablet TAKE 1 TABLET BY MOUTH IN THE MORNING AND 2 IN THE EVENING (Patient taking  differently: Take 50-100 mg by mouth 2 (two) times daily. TAKE 1 TABLET BY MOUTH IN THE MORNING AND 2 IN THE EVENING) 90 tablet 5 06/07/2018 at Unknown time  . albuterol (PROVENTIL HFA;VENTOLIN HFA) 108 (90 Base) MCG/ACT inhaler Inhale 1-2 puffs into the lungs every 6 (six) hours as needed for wheezing or shortness of breath. 1 Inhaler 5 PRN at PRN  . fluticasone furoate-vilanterol (BREO ELLIPTA) 100-25 MCG/INH AEPB Inhale 1 puff into the lungs daily. 1 each 5 PRN at PRN  . HYDROcodone-acetaminophen (NORCO/VICODIN) 5-325 MG tablet Take 1 tablet by mouth every 6 (six) hours as needed for moderate pain. Must last 28 days 90 tablet 0 PRN at PRN  . ibuprofen (ADVIL,MOTRIN) 200 MG tablet Take 800 mg by mouth every 6 (six) hours as needed for mild pain or moderate pain.   PRN at PRN  . ranolazine (RANEXA) 1000 MG SR tablet Take 1 tablet (1,000 mg total) by mouth 2 (two) times daily. (Patient not taking: Reported on 06/07/2018) 180 tablet 3 Not Taking at Unknown time   Scheduled: . ALPRAZolam  0.5 mg Oral BID  . ARIPiprazole  5 mg Oral QHS  . aspirin EC  325 mg Oral Daily  . Dextromethorphan-quiNIDine  1 capsule Oral BID  . enoxaparin (LOVENOX) injection  40 mg Subcutaneous Q24H  . escitalopram  20 mg Oral Daily  . fluticasone furoate-vilanterol  1 puff Inhalation Daily  . lamoTRIgine  100 mg Oral BID  . levothyroxine  50 mcg Oral QAC breakfast  . omega-3 acid ethyl esters  1 capsule Oral BID  . pantoprazole  40 mg Oral Daily  . rosuvastatin  20 mg Oral Daily  . topiramate  100 mg Oral QHS  . topiramate  50 mg Oral q morning - 10a    ROS: History obtained from the patient   General ROS: negative for - chills, fatigue, fever, night sweats, weight gain or weight loss Psychological ROS: negative for - behavioral disorder, hallucinations, memory difficulties, mood swings or suicidal ideation Ophthalmic ROS: positive for - blurry vision, double vision,  Negative for eye pain or loss of vision ENT ROS:  negative for - epistaxis, nasal discharge, oral lesions, sore throat, tinnitus or vertigo Allergy and Immunology ROS: negative for - hives or itchy/watery eyes Hematological and Lymphatic ROS: negative for - bleeding problems, bruising or swollen lymph nodes Endocrine ROS: negative for - galactorrhea, hair pattern changes, polydipsia/polyuria or temperature intolerance Respiratory ROS: negative for - cough, hemoptysis, shortness of breath or wheezing Cardiovascular ROS: negative for - chest pain, dyspnea on exertion, edema or irregular heartbeat Gastrointestinal ROS: negative for - abdominal pain, diarrhea, hematemesis, nausea/vomiting or stool incontinence Genito-Urinary ROS: negative for -  dysuria, hematuria, incontinence or urinary frequency/urgency Musculoskeletal ROS: negative for - joint swelling or muscular weakness Neurological ROS: as noted in HPI Dermatological ROS: negative for rash and skin lesion changes  Physical Examination: Blood pressure 117/78, pulse 83, temperature 98.4 F (36.9 C), temperature source Oral, resp. rate 17, height 5\' 6"  (1.676 m), weight 98 kg, SpO2 97 %.  ? Neurological Exam  Mental Status: Alert, Oriented to time, place, person and situation Attention span and concentration seemed appropriate Memory intact noted to be good historian Intact naming, repetition, comprehension.  Followed 2 step commands   Cranial Nerves: I. Olfactory not examined II: Visual fields were full. Pupils were equal, round and reactive to light and accommodation.  Binocular vertical diplopia III,IV, VI: Left eye ptosis present, extra-ocular motions intact bilaterally V,VII: smile symmetric, facial light touch sensation diminished on the right VIII: Finger rub was heard symmetric in both ears IX, X: Palate and uvular movements are normal and oral sensations are OK, gag reflex deffered XI: Neck muscle strength and shoulder shrug is normal XII: midline tongue  extension  Motor Exam: Tone is normal in all extremities Muscle strength in all extremities is 4+/5. No abnormal movements, fasciculations or atrophy seen  Deep Tendon Reflexes: Right Biceps is 2+, Left Biceps is 2+ Right Triceps is 2+, Left Triceps is 2+ Right Brachioradialis is 2+, Left Brachioradialis is 2+ Right Knee Jerk is 2+, Left Knee Jerk is 2+ Right Ankle Jerk is 2+, Left Ankle Jerk is 2+ Right Toes are down going, Left Toes are down going  Sensory Exam: Sensations were decreased to light touch in the right upper and lower extremities Vibration and proprioception are also intact  Co-ordination: Finger to nose is normal  Gait: Gait and station are slow and unsteady, required assistance to ambulate.  Data Reviewed  Laboratory Studies:  Basic Metabolic Panel: Recent Labs  Lab 06/07/18 1102 06/08/18 0428  NA 138 141  K 3.5 3.3*  CL 107 108  CO2 22 26  GLUCOSE 122* 105*  BUN 13 13  CREATININE 1.41* 1.30*  CALCIUM 8.7* 8.6*    Liver Function Tests: Recent Labs  Lab 06/07/18 1102  AST 21  ALT 17  ALKPHOS 95  BILITOT 0.6  PROT 7.0  ALBUMIN 4.2   No results for input(s): LIPASE, AMYLASE in the last 168 hours. No results for input(s): AMMONIA in the last 168 hours.  CBC: Recent Labs  Lab 06/07/18 1127 06/08/18 0428  WBC 8.2 7.2  NEUTROABS 6.1  --   HGB 15.7 14.8  HCT 45.3 42.7  MCV 92.9 93.5  PLT 240 213    Cardiac Enzymes: Recent Labs  Lab 06/07/18 1102  TROPONINI <0.03    BNP: Invalid input(s): POCBNP  CBG: No results for input(s): GLUCAP in the last 168 hours.  Microbiology: No results found for this or any previous visit.  Coagulation Studies: Recent Labs    06/07/18 1102  LABPROT 13.2  INR 1.01    Urinalysis: No results for input(s): COLORURINE, LABSPEC, PHURINE, GLUCOSEU, HGBUR, BILIRUBINUR, KETONESUR, PROTEINUR, UROBILINOGEN, NITRITE, LEUKOCYTESUR in the last 168 hours.  Invalid input(s): APPERANCEUR  Lipid  Panel:    Component Value Date/Time   CHOL 151 06/08/2018 0428   TRIG 277 (H) 06/08/2018 0428   HDL 27 (L) 06/08/2018 0428   CHOLHDL 5.6 06/08/2018 0428   VLDL 55 (H) 06/08/2018 0428   LDLCALC 69 06/08/2018 0428    HgbA1C: No results found for: HGBA1C  Urine Drug Screen:  No results  found for: LABOPIA, COCAINSCRNUR, LABBENZ, AMPHETMU, THCU, LABBARB  Alcohol Level: No results for input(s): ETH in the last 168 hours.  Other results: EKG: normal EKG, normal sinus rhythm, unchanged from previous tracings.  Imaging: Dg Skull 1-3 Views  Result Date: 06/07/2018 CLINICAL DATA:  47 year old with indwelling ventriculovenous shunt who presents with RIGHT facial and body numbness that began approximately 5 days ago associated with mild weakness and diplopia. Evaluate the continuity of the shunt catheter. EXAM: SKULL - 1-3 VIEW CHEST  1 VIEW ABDOMEN - 1 VIEW COMPARISON:  CT head earlier same day and previously. Chest x-ray 04/03/2018 and earlier. Abdominal x-rays 11/14/2012. FINDINGS: Ventriculovenous shunt catheter entering from a RIGHT parietal approach with its tip across the midline as noted on the earlier CT head. Shunt catheter tubing appears continuous in the head and neck. No focal osseous abnormalities involving the skull. Paranasal sinuses well aerated. Ventriculovenous shunt catheter with its tip overlying the LOWER SVC, unchanged. The shunt catheter appears intact. Cardiomediastinal silhouette unremarkable, unchanged. Lungs clear. Bronchovascular markings normal. Pulmonary vascularity normal. No visible pleural effusions. No pneumothorax. Bowel gas pattern unremarkable without evidence of obstruction or significant ileus. Moderate stool burden in the colon. Phlebolith low in the RIGHT side of the pelvis. No visible opaque urinary tract calculi. IMPRESSION: 1. Ventriculovenous shunt catheter tubing appears intact with its tip in the LOWER SVC, unchanged. 2.  No acute cardiopulmonary disease. 3. No  acute abdominal abnormality. . Electronically Signed   By: Evangeline Dakin M.D.   On: 06/07/2018 13:20   Dg Chest 1 View  Result Date: 06/07/2018 CLINICAL DATA:  47 year old with indwelling ventriculovenous shunt who presents with RIGHT facial and body numbness that began approximately 5 days ago associated with mild weakness and diplopia. Evaluate the continuity of the shunt catheter. EXAM: SKULL - 1-3 VIEW CHEST  1 VIEW ABDOMEN - 1 VIEW COMPARISON:  CT head earlier same day and previously. Chest x-ray 04/03/2018 and earlier. Abdominal x-rays 11/14/2012. FINDINGS: Ventriculovenous shunt catheter entering from a RIGHT parietal approach with its tip across the midline as noted on the earlier CT head. Shunt catheter tubing appears continuous in the head and neck. No focal osseous abnormalities involving the skull. Paranasal sinuses well aerated. Ventriculovenous shunt catheter with its tip overlying the LOWER SVC, unchanged. The shunt catheter appears intact. Cardiomediastinal silhouette unremarkable, unchanged. Lungs clear. Bronchovascular markings normal. Pulmonary vascularity normal. No visible pleural effusions. No pneumothorax. Bowel gas pattern unremarkable without evidence of obstruction or significant ileus. Moderate stool burden in the colon. Phlebolith low in the RIGHT side of the pelvis. No visible opaque urinary tract calculi. IMPRESSION: 1. Ventriculovenous shunt catheter tubing appears intact with its tip in the LOWER SVC, unchanged. 2.  No acute cardiopulmonary disease. 3. No acute abdominal abnormality. . Electronically Signed   By: Evangeline Dakin M.D.   On: 06/07/2018 13:20   Dg Abd 1 View  Result Date: 06/07/2018 CLINICAL DATA:  47 year old with indwelling ventriculovenous shunt who presents with RIGHT facial and body numbness that began approximately 5 days ago associated with mild weakness and diplopia. Evaluate the continuity of the shunt catheter. EXAM: SKULL - 1-3 VIEW CHEST  1 VIEW  ABDOMEN - 1 VIEW COMPARISON:  CT head earlier same day and previously. Chest x-ray 04/03/2018 and earlier. Abdominal x-rays 11/14/2012. FINDINGS: Ventriculovenous shunt catheter entering from a RIGHT parietal approach with its tip across the midline as noted on the earlier CT head. Shunt catheter tubing appears continuous in the head and neck. No focal  osseous abnormalities involving the skull. Paranasal sinuses well aerated. Ventriculovenous shunt catheter with its tip overlying the LOWER SVC, unchanged. The shunt catheter appears intact. Cardiomediastinal silhouette unremarkable, unchanged. Lungs clear. Bronchovascular markings normal. Pulmonary vascularity normal. No visible pleural effusions. No pneumothorax. Bowel gas pattern unremarkable without evidence of obstruction or significant ileus. Moderate stool burden in the colon. Phlebolith low in the RIGHT side of the pelvis. No visible opaque urinary tract calculi. IMPRESSION: 1. Ventriculovenous shunt catheter tubing appears intact with its tip in the LOWER SVC, unchanged. 2.  No acute cardiopulmonary disease. 3. No acute abdominal abnormality. . Electronically Signed   By: Evangeline Dakin M.D.   On: 06/07/2018 13:20   Ct Head Wo Contrast  Result Date: 06/07/2018 CLINICAL DATA:  Right-sided body numbness for 5 days. Double vision. History of brain tumor and VP shunt. EXAM: CT HEAD WITHOUT CONTRAST TECHNIQUE: Contiguous axial images were obtained from the base of the skull through the vertex without intravenous contrast. COMPARISON:  10/03/2017 FINDINGS: Brain: Coarse calcifications in the cerebellum and to a lesser extent occipital lobes and pons are unchanged and likely related to remote radiation therapy. A right parietal approach ventriculostomy catheter terminates at the level of the left caudate nucleus, unchanged. The ventricles are unchanged in size and configuration without dilatation. Linear calcification in the right frontal lobe is unchanged and  may be related to a prior ventriculostomy catheter. Chronic lacunar infarcts are again seen in the right thalamus and basal ganglia. A lacunar infarct in the posterior inferior left thalamus is new. No acute large territory infarct, intracranial hemorrhage, or midline shift is identified. Low-density subdural fluid collections are again seen bilaterally measuring up to 3 mm in thickness on the right and 4 mm on the left with the left-sided collection being smaller than on the prior study. Vascular: No hyperdense vessel. Skull: Suboccipital craniectomy. Sinuses/Orbits: Visualized paranasal sinuses and mastoid air cells are clear. Visualized orbits are unremarkable. Other: None. IMPRESSION: 1. New left thalamic lacunar infarct. 2. No evidence of acute large territory infarct or intracranial hemorrhage. 3. Unchanged appearance of the ventricles and VP shunt catheter. 4. Small chronic bilateral subdural fluid collections, slightly decreased in size on the left. Electronically Signed   By: Logan Bores M.D.   On: 06/07/2018 12:20    Patient seen and examined.  Clinical course and management discussed.  Necessary edits performed.  I agree with the above.  Assessment and plan of care developed and discussed below.     Assessment: 47 y.o. male presenting with right face, arm and leg numbness/weakness for the past week and double vision for a few months.  Work up included head CT that upon review shows what appears to be a new left thalamic infarct from previous imaging of December of 2018.   It is unclear though if it is actually acute.  US carotids showed less than 50% stenosis in the right and left internal carotid arteries. Echocardiogram pending. Hgb A1c 5.4, LDL -69.  Patient on ASA prior to admission.   Patient will require further imaging but has a programmable shunt that we are unable to address for further imaging at this site.  Patient followed by Dr. Christella Noa.  To be transferred for further work up.     Stroke Risk Factors - hyperlipidemia  Plan: 1. Transfer to [facility] for further imaging and management of shunt 2. PT consult, OT consult, Speech consult 3. Patient to continue ASA 4. NPO until RN stroke swallow screen 5. Telemetry  monitoring 6. Frequent neuro checks   This patient was staffed with Dr. Magda Paganini, Doy Mince who personally evaluated patient, reviewed documentation and agreed with assessment and plan of care as above.  Rufina Falco, DNP, FNP-BC Board certified Nurse Practitioner Neurology Department   06/08/2018, 9:29 AM   Alexis Goodell, MD Neurology [phone]  06/08/2018  2:04 PM

## 2018-06-08 NOTE — Progress Notes (Signed)
Patient's aunt Hassan Rowan made aware of patient's transfer and room number at [facility]  Clarise Cruz, RN, BSN

## 2018-06-08 NOTE — Progress Notes (Signed)
OT Cancellation Note  Patient Details Name: [name] Love MRN: [id number] DOB: October 04, 1971   Cancelled Treatment:    Reason Eval/Treat Not Completed: Patient at procedure or test/ unavailable. On 2nd attempt, pt sitting EOB completing UB dressing independently. Cardiology in room shortly after to perform echocardiogram. Will re-attempt OT eval later this date.   Jeni Salles, MPH, MS, OTR/L ascom [phone] 06/08/18, 10:10 AM

## 2018-06-08 NOTE — Progress Notes (Signed)
*  PRELIMINARY RESULTS* Echocardiogram 2D Echocardiogram has been performed.  [name] Love 06/08/2018, 10:42 AM

## 2018-06-08 NOTE — H&P (Signed)
History and Physical  [name] Love VHQ:[id number] DOB: Dec 08, 1970 DOA: 06/08/2018  Referring physician: Transfer from [facility] PCP: Vanetta Shawl, Lauralyn Primes, [place]  Outpatient Specialists: Neurologist and neurosurgeon Patient coming from: [facility]  Chief Complaint: Acute left thalamic lacunar infarct  HPI: Patient is a 47 year old Caucasian male, with past medical history significant for posterior fossa ependymoma status post resection in 1995, with multiple revisions, including VP shunt/revision, seizures, anxiety, abnormal gait, multiple falls, sensorineural deafness amongst other past medical history.  Patient presented to [facility] with 4-day history of numbness involving the right side of the face, right side of the body with weakness of the right upper and right lower extremity.  CT scan of the brain done at [facility] revealed new left thalamic lacunar infarct.  Patient is currently on aspirin and statin.  Patient was transferred to this hospital as [facility] could not pursue MRI of the brain (patient has reprogrammable VP shunt that may need to be reprogrammed after MRI).  Patient's primary neurologist, Dr. Jannifer Franklin is also based in this hospital.  Patient also sees neurosurgeon, Dr. Christella Noa, that is based out of [facility].  On further questioning, patient reported that he has been having double vision for the last 1 month.  No headache, no neck pain, no fever chills, no chest pain, no shortness of breath, no GI symptoms and no urinary symptoms.  ED Course: Patient was transferred from [facility]. Pertinent labs: CT head result is as documented above.  Labs done over at [facility] reviewed, potassium was 3.3, total cholesterol was 151, LDL was 69 with HDL of 27. EKG: Independently reviewed.  Imaging: independently reviewed.   Review of Systems:  Negative for fever,  sore throat, rash, new muscle aches, chest pain, SOB, dysuria, bleeding, n/v/abdominal pain.  Past Medical History:  Diagnosis Date  . Abnormal gait   . Allergic rhinitis   . Anxiety   . Bilateral arm weakness   . Brain cancer ([place])   . Depression   . Ependymoma of brain ([place])   . Hearing loss, sensorineural   . Hypothyroidism   . Insomnia   . Left leg weakness   . Low vitamin B12 level   . Seizures ([place])     Past Surgical History:  Procedure Laterality Date  . BRAIN SURGERY    . HERNIA REPAIR    . SHUNT REVISION       reports that he has never smoked. He has never used smokeless tobacco. He reports that he does not drink alcohol or use drugs.  Allergies  Allergen Reactions  . Baclofen     Urinary retension  . Seroquel [Quetiapine]     Weight gain  . Sulfa Antibiotics   . Coconut Flavor Rash    ANYTHING COCONUT  . Contrast Media [Iodinated Diagnostic Agents] Rash    Family History  Problem Relation Age of Onset  . Brain cancer Mother   . High blood pressure Mother      Prior to Admission medications   Medication Sig Start Date End Date Taking? Authorizing Provider  albuterol (PROVENTIL HFA;VENTOLIN HFA) 108 (90 Base) MCG/ACT inhaler Inhale 1-2 puffs into the lungs every 6 (six) hours as needed for wheezing or shortness of breath. 04/03/18   Rigoberto Noel, MD  ALPRAZolam Duanne Moron) 0.5 MG tablet Take 0.5 mg by mouth 2 (two) times daily. 05/30/18   [provider]  ARIPiprazole (ABILIFY) 5 MG tablet  Take 5 mg by mouth at bedtime.    [provider]  aspirin EC 325 MG EC tablet Take 1 tablet (325 mg total) by mouth daily. 06/09/18   Bettey Costa, MD  escitalopram (LEXAPRO) 20 MG tablet Take 20 mg by mouth daily.  12/31/16   [provider]  fluticasone furoate-vilanterol (BREO ELLIPTA) 100-25 MCG/INH AEPB Inhale 1 puff into the lungs daily. 04/10/18   Rigoberto Noel, MD  ibuprofen (ADVIL,MOTRIN) 200 MG tablet Take 800 mg by mouth every 6 (six) hours  as needed for mild pain or moderate pain.    [provider]  lamoTRIgine (LAMICTAL) 100 MG tablet TAKE 1 TABLET BY MOUTH TWICE DAILY 02/09/18   Kathrynn Ducking, MD  levothyroxine (SYNTHROID, LEVOTHROID) 50 MCG tablet Take 50 mcg by mouth daily before breakfast.  12/25/16   [provider]  NUEDEXTA 20-10 MG CAPS Take 1 capsule by mouth 2 (two) times daily. 12/31/16   [provider]  omega-3 fish oil (MAXEPA) 1000 MG CAPS capsule Take 1 capsule by mouth 2 (two) times daily.    [provider]  omeprazole (PRILOSEC) 20 MG capsule Take 20 mg by mouth daily.    [provider]  rosuvastatin (CRESTOR) 20 MG tablet Take 1 tablet (20 mg total) by mouth daily. 06/09/18   Bettey Costa, MD  topiramate (TOPAMAX) 50 MG tablet TAKE 1 TABLET BY MOUTH IN THE MORNING AND 2 IN THE EVENING Patient taking differently: Take 50-100 mg by mouth 2 (two) times daily. TAKE 1 TABLET BY MOUTH IN THE MORNING AND 2 IN THE EVENING 02/20/18   Kathrynn Ducking, MD    Physical Exam: There were no vitals filed for this visit.  Constitutional:  . Appears calm and comfortable.  Obese Eyes:  . No pallor. No jaundice.  ENMT:  . external ears, nose appear normal Neck:  . Neck is supple. No JVD Respiratory:  . CTA bilaterally, no w/r/r.  . Respiratory effort normal. No retractions or accessory muscle use Cardiovascular:  . S1S2 . No LE extremity edema   Abdomen:  . Abdomen is soft and non tender. Organs are difficult to assess. Neurologic:  . Awake and alert. . Weakness of the right upper and right lower extremities.    Wt Readings from Last 3 Encounters:  06/07/18 98 kg  04/03/18 100.6 kg  02/02/18 102.3 kg    I have personally reviewed following labs and imaging studies  Labs on Admission:  CBC: Recent Labs  Lab 06/07/18 1127 06/08/18 0428  WBC 8.2 7.2  NEUTROABS 6.1  --   HGB 15.7 14.8  HCT 45.3 42.7  MCV 92.9 93.5  PLT 240 867   Basic Metabolic  Panel: Recent Labs  Lab 06/07/18 1102 06/08/18 0428  NA 138 141  K 3.5 3.3*  CL 107 108  CO2 22 26  GLUCOSE 122* 105*  BUN 13 13  CREATININE 1.41* 1.30*  CALCIUM 8.7* 8.6*   Liver Function Tests: Recent Labs  Lab 06/07/18 1102  AST 21  ALT 17  ALKPHOS 95  BILITOT 0.6  PROT 7.0  ALBUMIN 4.2   No results for input(s): LIPASE, AMYLASE in the last 168 hours. No results for input(s): AMMONIA in the last 168 hours. Coagulation Profile: Recent Labs  Lab 06/07/18 1102  INR 1.01   Cardiac Enzymes: Recent Labs  Lab 06/07/18 1102  TROPONINI <0.03   BNP (last 3 results) No results for input(s): PROBNP in the last 8760 hours. HbA1C:  Recent Labs    06/08/18 0428  HGBA1C 5.4   CBG: No results for input(s): GLUCAP in the last 168 hours. Lipid Profile: Recent Labs    06/08/18 0428  CHOL 151  HDL 27*  LDLCALC 69  TRIG 277*  CHOLHDL 5.6   Thyroid Function Tests: No results for input(s): TSH, T4TOTAL, FREET4, T3FREE, THYROIDAB in the last 72 hours. Anemia Panel: No results for input(s): VITAMINB12, FOLATE, FERRITIN, TIBC, IRON, RETICCTPCT in the last 72 hours. Urine analysis:    Component Value Date/Time   COLORURINE Yellow 11/13/2012 1300   APPEARANCEUR Clear 11/13/2012 1300   LABSPEC 1.020 11/13/2012 1300   PHURINE 5.0 11/13/2012 1300   GLUCOSEU Negative 11/13/2012 1300   HGBUR Negative 11/13/2012 1300   BILIRUBINUR Negative 11/13/2012 1300   KETONESUR Negative 11/13/2012 1300   PROTEINUR Negative 11/13/2012 1300   NITRITE Negative 11/13/2012 1300   LEUKOCYTESUR Negative 11/13/2012 1300   Sepsis Labs: @LABRCNTIP (procalcitonin:4,lacticidven:4) )No results found for this or any previous visit (from the past 240 hour(s)).    Radiological Exams on Admission: Dg Skull 1-3 Views  Result Date: 06/07/2018 CLINICAL DATA:  47 year old with indwelling ventriculovenous shunt who presents with RIGHT facial and body numbness that began approximately 5 days ago  associated with mild weakness and diplopia. Evaluate the continuity of the shunt catheter. EXAM: SKULL - 1-3 VIEW CHEST  1 VIEW ABDOMEN - 1 VIEW COMPARISON:  CT head earlier same day and previously. Chest x-ray 04/03/2018 and earlier. Abdominal x-rays 11/14/2012. FINDINGS: Ventriculovenous shunt catheter entering from a RIGHT parietal approach with its tip across the midline as noted on the earlier CT head. Shunt catheter tubing appears continuous in the head and neck. No focal osseous abnormalities involving the skull. Paranasal sinuses well aerated. Ventriculovenous shunt catheter with its tip overlying the LOWER SVC, unchanged. The shunt catheter appears intact. Cardiomediastinal silhouette unremarkable, unchanged. Lungs clear. Bronchovascular markings normal. Pulmonary vascularity normal. No visible pleural effusions. No pneumothorax. Bowel gas pattern unremarkable without evidence of obstruction or significant ileus. Moderate stool burden in the colon. Phlebolith low in the RIGHT side of the pelvis. No visible opaque urinary tract calculi. IMPRESSION: 1. Ventriculovenous shunt catheter tubing appears intact with its tip in the LOWER SVC, unchanged. 2.  No acute cardiopulmonary disease. 3. No acute abdominal abnormality. . Electronically Signed   By: Evangeline Dakin M.D.   On: 06/07/2018 13:20   Dg Chest 1 View  Result Date: 06/07/2018 CLINICAL DATA:  47 year old with indwelling ventriculovenous shunt who presents with RIGHT facial and body numbness that began approximately 5 days ago associated with mild weakness and diplopia. Evaluate the continuity of the shunt catheter. EXAM: SKULL - 1-3 VIEW CHEST  1 VIEW ABDOMEN - 1 VIEW COMPARISON:  CT head earlier same day and previously. Chest x-ray 04/03/2018 and earlier. Abdominal x-rays 11/14/2012. FINDINGS: Ventriculovenous shunt catheter entering from a RIGHT parietal approach with its tip across the midline as noted on the earlier CT head. Shunt catheter  tubing appears continuous in the head and neck. No focal osseous abnormalities involving the skull. Paranasal sinuses well aerated. Ventriculovenous shunt catheter with its tip overlying the LOWER SVC, unchanged. The shunt catheter appears intact. Cardiomediastinal silhouette unremarkable, unchanged. Lungs clear. Bronchovascular markings normal. Pulmonary vascularity normal. No visible pleural effusions. No pneumothorax. Bowel gas pattern unremarkable without evidence of obstruction or significant ileus. Moderate stool burden in the colon. Phlebolith low in the RIGHT side of the pelvis. No visible opaque urinary tract calculi. IMPRESSION: 1. Ventriculovenous shunt catheter  tubing appears intact with its tip in the LOWER SVC, unchanged. 2.  No acute cardiopulmonary disease. 3. No acute abdominal abnormality. . Electronically Signed   By: Evangeline Dakin M.D.   On: 06/07/2018 13:20   Dg Abd 1 View  Result Date: 06/07/2018 CLINICAL DATA:  47 year old with indwelling ventriculovenous shunt who presents with RIGHT facial and body numbness that began approximately 5 days ago associated with mild weakness and diplopia. Evaluate the continuity of the shunt catheter. EXAM: SKULL - 1-3 VIEW CHEST  1 VIEW ABDOMEN - 1 VIEW COMPARISON:  CT head earlier same day and previously. Chest x-ray 04/03/2018 and earlier. Abdominal x-rays 11/14/2012. FINDINGS: Ventriculovenous shunt catheter entering from a RIGHT parietal approach with its tip across the midline as noted on the earlier CT head. Shunt catheter tubing appears continuous in the head and neck. No focal osseous abnormalities involving the skull. Paranasal sinuses well aerated. Ventriculovenous shunt catheter with its tip overlying the LOWER SVC, unchanged. The shunt catheter appears intact. Cardiomediastinal silhouette unremarkable, unchanged. Lungs clear. Bronchovascular markings normal. Pulmonary vascularity normal. No visible pleural effusions. No pneumothorax. Bowel  gas pattern unremarkable without evidence of obstruction or significant ileus. Moderate stool burden in the colon. Phlebolith low in the RIGHT side of the pelvis. No visible opaque urinary tract calculi. IMPRESSION: 1. Ventriculovenous shunt catheter tubing appears intact with its tip in the LOWER SVC, unchanged. 2.  No acute cardiopulmonary disease. 3. No acute abdominal abnormality. . Electronically Signed   By: Evangeline Dakin M.D.   On: 06/07/2018 13:20   Ct Head Wo Contrast  Result Date: 06/07/2018 CLINICAL DATA:  Right-sided body numbness for 5 days. Double vision. History of brain tumor and VP shunt. EXAM: CT HEAD WITHOUT CONTRAST TECHNIQUE: Contiguous axial images were obtained from the base of the skull through the vertex without intravenous contrast. COMPARISON:  10/03/2017 FINDINGS: Brain: Coarse calcifications in the cerebellum and to a lesser extent occipital lobes and pons are unchanged and likely related to remote radiation therapy. A right parietal approach ventriculostomy catheter terminates at the level of the left caudate nucleus, unchanged. The ventricles are unchanged in size and configuration without dilatation. Linear calcification in the right frontal lobe is unchanged and may be related to a prior ventriculostomy catheter. Chronic lacunar infarcts are again seen in the right thalamus and basal ganglia. A lacunar infarct in the posterior inferior left thalamus is new. No acute large territory infarct, intracranial hemorrhage, or midline shift is identified. Low-density subdural fluid collections are again seen bilaterally measuring up to 3 mm in thickness on the right and 4 mm on the left with the left-sided collection being smaller than on the prior study. Vascular: No hyperdense vessel. Skull: Suboccipital craniectomy. Sinuses/Orbits: Visualized paranasal sinuses and mastoid air cells are clear. Visualized orbits are unremarkable. Other: None. IMPRESSION: 1. New left thalamic lacunar  infarct. 2. No evidence of acute large territory infarct or intracranial hemorrhage. 3. Unchanged appearance of the ventricles and VP shunt catheter. 4. Small chronic bilateral subdural fluid collections, slightly decreased in size on the left. Electronically Signed   By: Logan Bores M.D.   On: 06/07/2018 12:20   US Carotid Bilateral  Result Date: 06/08/2018 CLINICAL DATA:  Cerebral infarction EXAM: BILATERAL CAROTID DUPLEX ULTRASOUND TECHNIQUE: Pearline Cables scale imaging, color Doppler and duplex ultrasound were performed of bilateral carotid and vertebral arteries in the neck. COMPARISON:  None. FINDINGS: Criteria: Quantification of carotid stenosis is based on velocity parameters that correlate the residual internal carotid diameter with  NASCET-based stenosis levels, using the diameter of the distal internal carotid lumen as the denominator for stenosis measurement. The following velocity measurements were obtained: RIGHT ICA: 52 cm/sec CCA: 254 cm/sec SYSTOLIC ICA/CCA RATIO:  0.5 ECA: 105 cm/sec LEFT ICA: 40 cm/sec CCA: 982 cm/sec SYSTOLIC ICA/CCA RATIO:  0.4 ECA: 93 cm/sec RIGHT CAROTID ARTERY: Little if any plaque in the bulb. Low resistance internal carotid Doppler pattern is preserved. RIGHT VERTEBRAL ARTERY:  Antegrade. LEFT CAROTID ARTERY: Little if any plaque in the bulb. Low resistance internal carotid Doppler pattern is preserved. LEFT VERTEBRAL ARTERY:  Antegrade. IMPRESSION: Less than 50% stenosis in the right and left internal carotid arteries. Electronically Signed   By: Marybelle Killings M.D.   On: 06/08/2018 11:48    EKG: Independently reviewed.   Active Problems:   Acute CVA (cerebrovascular accident) ([place])   Assessment/Plan 1. New left thalamic lacunar infarct.  Continue aspirin and Crestor. Consult neurology (discussed with Dr. Cheral Marker). Will defer to decision to proceed with MRI of the brain to the neurology team. Consult physical therapy Consult Occupational Therapy Results of fasting  lipid profile, A1c noted. Further management depend on hospital course.  2. History of brain ependymoma status post resection Patient sees local neurosurgeon. Seems to be is stable for now.  3. History of seizures. No current seizures reported. Controlled.  4. History of gait abnormality. 5. History of recurrent falls. 6. Hypokalemia. Replete.  7. One month history of double vision. Will defer to neurology team as well.  Further management will depend on hospital course.  DVT prophylaxis: Subcu Lovenox Code Status: Full Family Communication:  Disposition Plan: Will depend on hospital course Consults called: Neurology Admission status: Patient  Time spent: 65 minutes minutes  Dana Allan, MD  [facility] Pager #: [phone] 7PM-7AM contact night coverage as above   06/08/2018, 7:10 PM

## 2018-06-08 NOTE — Plan of Care (Signed)
  Problem: Education: Goal: Knowledge of disease or condition will improve Outcome: Progressing Goal: Knowledge of secondary prevention will improve Outcome: Progressing Goal: Knowledge of patient specific risk factors addressed and post discharge goals established will improve Outcome: Progressing Goal: Individualized Educational Video(s) Outcome: Progressing   Problem: Coping: Goal: Will verbalize positive feelings about self Outcome: Progressing Goal: Will identify appropriate support needs Outcome: Progressing   Problem: Health Behavior/Discharge Planning: Goal: Ability to manage health-related needs will improve Outcome: Progressing   Problem: Self-Care: Goal: Ability to participate in self-care as condition permits will improve Outcome: Progressing Goal: Verbalization of feelings and concerns over difficulty with self-care will improve Outcome: Progressing Goal: Ability to communicate needs accurately will improve Outcome: Progressing   Problem: Nutrition: Goal: Risk of aspiration will decrease Outcome: Progressing Goal: Dietary intake will improve Outcome: Progressing   Problem: Intracerebral Hemorrhage Tissue Perfusion: Goal: Complications of Intracerebral Hemorrhage will be minimized Outcome: Progressing   Problem: Ischemic Stroke/TIA Tissue Perfusion: Goal: Complications of ischemic stroke/TIA will be minimized Outcome: Progressing   Problem: Spontaneous Subarachnoid Hemorrhage Tissue Perfusion: Goal: Complications of Spontaneous Subarachnoid Hemorrhage will be minimized Outcome: Progressing   Problem: Education: Goal: Knowledge of General Education information will improve Description Including pain rating scale, medication(s)/side effects and non-pharmacologic comfort measures Outcome: Progressing   Problem: Health Behavior/Discharge Planning: Goal: Ability to manage health-related needs will improve Outcome: Progressing   Problem: Clinical  Measurements: Goal: Ability to maintain clinical measurements within normal limits will improve Outcome: Progressing Goal: Will remain free from infection Outcome: Progressing Goal: Diagnostic test results will improve Outcome: Progressing Goal: Respiratory complications will improve Outcome: Progressing Goal: Cardiovascular complication will be avoided Outcome: Progressing   Problem: Activity: Goal: Risk for activity intolerance will decrease Outcome: Progressing

## 2018-06-08 NOTE — Discharge Summary (Signed)
[facility] NAME: [name] Love    MR#:  [id number]  DATE OF BIRTH:  [birth year]  DATE OF ADMISSION:  06/07/2018 ADMITTING PHYSICIAN: Henreitta Leber, MD  DATE OF DISCHARGE: 06/08/2018  PRIMARY CARE PHYSICIAN: Isaias Sakai, DO    ADMISSION DIAGNOSIS:  Thalamic infarction ([place]) [I63.9]  DISCHARGE DIAGNOSIS:  Active Problems:   CVA (cerebral vascular accident) ([place])   SECONDARY DIAGNOSIS:   Past Medical History:  Diagnosis Date  . Abnormal gait   . Allergic rhinitis   . Anxiety   . Bilateral arm weakness   . Brain cancer ([place])   . Depression   . 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 Hearing loss, sensorineural   . Hypothyroidism   . Insomnia   . Left leg weakness   . Low vitamin B12 level   . Seizures [facility])     HOSPITAL COURSE:  47 year old male with history of VP shunt and seizures who presents with right-sided weakness.  1.  Acute CVA: This may be etiology of patient's right-sided weakness/numbness Follow-up in neurology consultation Follow-up on CVA work-up including  Doppler and echocardiogram LDL 69 Continue aspirin and statin  He cannot get MRI here and will need to be transferred to [facility]  Dr Leonel Ramsay will see patient on consultation.   2.  Diplopia: This seems to be a long term issue However given VP shunt patient will need MRI  3.  History of seizures: Continue Lamictal and Topamax  4.  Anxiety depression: Continue Xanax, Lexapro and Abilify  5.  Hypothyroidism: Continue Synthroid    DISCHARGE CONDITIONS AND DIET:   Stable Cardiac diet  CONSULTS OBTAINED:  Treatment Team:  Alexis Goodell, MD  DRUG ALLERGIES:   Allergies  Allergen Reactions  . Baclofen     Urinary retension  . Seroquel [Quetiapine]     Weight gain  . Sulfa Antibiotics   . Coconut Flavor Rash    ANYTHING COCONUT  . Contrast Media [Iodinated Diagnostic Agents] Rash    DISCHARGE MEDICATIONS:    Allergies as of 06/08/2018      Reactions   Baclofen    Urinary retension   Seroquel [quetiapine]    Weight gain   Sulfa Antibiotics    Coconut Flavor Rash   ANYTHING COCONUT   Contrast Media [iodinated Diagnostic Agents] Rash      Medication List    STOP taking these medications   HYDROcodone-acetaminophen 5-325 MG tablet Commonly known as:  NORCO/VICODIN   ranolazine 1000 MG SR tablet Commonly known as:  RANEXA     TAKE these medications   albuterol 108 (90 Base) MCG/ACT inhaler Commonly known as:  PROVENTIL HFA;VENTOLIN HFA Inhale 1-2 puffs into the lungs every 6 (six) hours as needed for wheezing or shortness of breath.   ALPRAZolam 0.5 MG tablet Commonly known as:  XANAX Take 0.5 mg by mouth 2 (two) times daily.   ARIPiprazole 5 MG tablet Commonly known as:  ABILIFY Take 5 mg by mouth at bedtime.   aspirin 325 MG EC tablet Take 1 tablet (325 mg total) by mouth daily. Start taking on:  06/09/2018   escitalopram 20 MG tablet Commonly known as:  LEXAPRO Take 20 mg by mouth daily.   fluticasone furoate-vilanterol 100-25 MCG/INH Aepb Commonly known as:  BREO ELLIPTA Inhale 1 puff into the lungs daily.   ibuprofen 200 MG tablet Commonly known as:  ADVIL,MOTRIN Take 800 mg by mouth every 6 (six) hours  as needed for mild pain or moderate pain.   lamoTRIgine 100 MG tablet Commonly known as:  LAMICTAL TAKE 1 TABLET BY MOUTH TWICE DAILY   levothyroxine 50 MCG tablet Commonly known as:  SYNTHROID, LEVOTHROID Take 50 mcg by mouth daily before breakfast.   NUEDEXTA 20-10 MG Caps Generic drug:  Dextromethorphan-quiNIDine Take 1 capsule by mouth 2 (two) times daily.   omega-3 fish oil 1000 MG Caps capsule Commonly known as:  MAXEPA Take 1 capsule by mouth 2 (two) times daily.   omeprazole 20 MG capsule Commonly known as:  PRILOSEC Take 20 mg by mouth daily.   rosuvastatin 20 MG tablet Commonly known as:  CRESTOR Take 1 tablet (20 mg total) by mouth  daily. Start taking on:  06/09/2018   topiramate 50 MG tablet Commonly known as:  TOPAMAX TAKE 1 TABLET BY MOUTH IN THE MORNING AND 2 IN THE EVENING What changed:    how much to take  how to take this  when to take this         Today   CHIEF COMPLAINT:  Still with right sided weakness   VITAL SIGNS:  Blood pressure 117/78, pulse 83, temperature 98.4 F (36.9 C), temperature source Oral, resp. rate 18, height 5\' 6"  (1.676 m), weight 98 kg, SpO2 97 %.   REVIEW OF SYSTEMS:  Review of Systems  Constitutional: Negative.  Negative for chills, fever and malaise/fatigue.  HENT: Negative.  Negative for ear discharge, ear pain, hearing loss, nosebleeds and sore throat.   Eyes: Negative.  Negative for blurred vision and pain.  Respiratory: Negative.  Negative for cough, hemoptysis, shortness of breath and wheezing.   Cardiovascular: Negative.  Negative for chest pain, palpitations and leg swelling.  Gastrointestinal: Negative.  Negative for abdominal pain, blood in stool, diarrhea, nausea and vomiting.  Genitourinary: Negative.  Negative for dysuria.  Musculoskeletal: Negative.  Negative for back pain.  Skin: Negative.   Neurological: Positive for weakness. Negative for dizziness, tremors, speech change, focal weakness, seizures and headaches.  Endo/Heme/Allergies: Negative.  Does not bruise/bleed easily.  Psychiatric/Behavioral: Negative.  Negative for depression, hallucinations and suicidal ideas.     PHYSICAL EXAMINATION:  GENERAL:  47 y.o.-year-old patient lying in the bed with no acute distress.  NECK:  Supple, no jugular venous distention. No thyroid enlargement, no tenderness.  LUNGS: Normal breath sounds bilaterally, no wheezing, rales,rhonchi  No use of accessory muscles of respiration.  CARDIOVASCULAR: S1, S2 normal. No murmurs, rubs, or gallops.  ABDOMEN: Soft, non-tender, non-distended. Bowel sounds present. No organomegaly or mass.  EXTREMITIES: No pedal edema,  cyanosis, or clubbing.  PSYCHIATRIC: The patient is alert and oriented x 3.  SKIN: No obvious rash, lesion, or ulcer.  RUE/RLE 4/5 DATA REVIEW:   CBC Recent Labs  Lab 06/08/18 0428  WBC 7.2  HGB 14.8  HCT 42.7  PLT 213    Chemistries  Recent Labs  Lab 06/07/18 1102 06/08/18 0428  NA 138 141  K 3.5 3.3*  CL 107 108  CO2 22 26  GLUCOSE 122* 105*  BUN 13 13  CREATININE 1.41* 1.30*  CALCIUM 8.7* 8.6*  AST 21  --   ALT 17  --   ALKPHOS 95  --   BILITOT 0.6  --     Cardiac Enzymes Recent Labs  Lab 06/07/18 1102  TROPONINI <0.03    Microbiology Results  @[email or web address] @  RADIOLOGY:  Dg Skull 1-3 Views  Result Date: 06/07/2018 CLINICAL DATA:  47 year old with indwelling ventriculovenous shunt  who presents with RIGHT facial and body numbness that began approximately 5 days ago associated with mild weakness and diplopia. Evaluate the continuity of the shunt catheter. EXAM: SKULL - 1-3 VIEW CHEST  1 VIEW ABDOMEN - 1 VIEW COMPARISON:  CT head earlier same day and previously. Chest x-ray 04/03/2018 and earlier. Abdominal x-rays 11/14/2012. FINDINGS: Ventriculovenous shunt catheter entering from a RIGHT parietal approach with its tip across the midline as noted on the earlier CT head. Shunt catheter tubing appears continuous in the head and neck. No focal osseous abnormalities involving the skull. Paranasal sinuses well aerated. Ventriculovenous shunt catheter with its tip overlying the LOWER SVC, unchanged. The shunt catheter appears intact. Cardiomediastinal silhouette unremarkable, unchanged. Lungs clear. Bronchovascular markings normal. Pulmonary vascularity normal. No visible pleural effusions. No pneumothorax. Bowel gas pattern unremarkable without evidence of obstruction or significant ileus. Moderate stool burden in the colon. Phlebolith low in the RIGHT side of the pelvis. No visible opaque urinary tract calculi. IMPRESSION: 1. Ventriculovenous shunt catheter tubing appears  intact with its tip in the LOWER SVC, unchanged. 2.  No acute cardiopulmonary disease. 3. No acute abdominal abnormality. . Electronically Signed   By: Evangeline Dakin M.D.   On: 06/07/2018 13:20   Dg Chest 1 View  Result Date: 06/07/2018 CLINICAL DATA:  47 year old with indwelling ventriculovenous shunt who presents with RIGHT facial and body numbness that began approximately 5 days ago associated with mild weakness and diplopia. Evaluate the continuity of the shunt catheter. EXAM: SKULL - 1-3 VIEW CHEST  1 VIEW ABDOMEN - 1 VIEW COMPARISON:  CT head earlier same day and previously. Chest x-ray 04/03/2018 and earlier. Abdominal x-rays 11/14/2012. FINDINGS: Ventriculovenous shunt catheter entering from a RIGHT parietal approach with its tip across the midline as noted on the earlier CT head. Shunt catheter tubing appears continuous in the head and neck. No focal osseous abnormalities involving the skull. Paranasal sinuses well aerated. Ventriculovenous shunt catheter with its tip overlying the LOWER SVC, unchanged. The shunt catheter appears intact. Cardiomediastinal silhouette unremarkable, unchanged. Lungs clear. Bronchovascular markings normal. Pulmonary vascularity normal. No visible pleural effusions. No pneumothorax. Bowel gas pattern unremarkable without evidence of obstruction or significant ileus. Moderate stool burden in the colon. Phlebolith low in the RIGHT side of the pelvis. No visible opaque urinary tract calculi. IMPRESSION: 1. Ventriculovenous shunt catheter tubing appears intact with its tip in the LOWER SVC, unchanged. 2.  No acute cardiopulmonary disease. 3. No acute abdominal abnormality. . Electronically Signed   By: Evangeline Dakin M.D.   On: 06/07/2018 13:20   Dg Abd 1 View  Result Date: 06/07/2018 CLINICAL DATA:  47 year old with indwelling ventriculovenous shunt who presents with RIGHT facial and body numbness that began approximately 5 days ago associated with mild weakness and  diplopia. Evaluate the continuity of the shunt catheter. EXAM: SKULL - 1-3 VIEW CHEST  1 VIEW ABDOMEN - 1 VIEW COMPARISON:  CT head earlier same day and previously. Chest x-ray 04/03/2018 and earlier. Abdominal x-rays 11/14/2012. FINDINGS: Ventriculovenous shunt catheter entering from a RIGHT parietal approach with its tip across the midline as noted on the earlier CT head. Shunt catheter tubing appears continuous in the head and neck. No focal osseous abnormalities involving the skull. Paranasal sinuses well aerated. Ventriculovenous shunt catheter with its tip overlying the LOWER SVC, unchanged. The shunt catheter appears intact. Cardiomediastinal silhouette unremarkable, unchanged. Lungs clear. Bronchovascular markings normal. Pulmonary vascularity normal. No visible pleural effusions. No pneumothorax. Bowel gas pattern unremarkable without evidence of obstruction or  significant ileus. Moderate stool burden in the colon. Phlebolith low in the RIGHT side of the pelvis. No visible opaque urinary tract calculi. IMPRESSION: 1. Ventriculovenous shunt catheter tubing appears intact with its tip in the LOWER SVC, unchanged. 2.  No acute cardiopulmonary disease. 3. No acute abdominal abnormality. . Electronically Signed   By: Evangeline Dakin M.D.   On: 06/07/2018 13:20   Ct Head Wo Contrast  Result Date: 06/07/2018 CLINICAL DATA:  Right-sided body numbness for 5 days. Double vision. History of brain tumor and VP shunt. EXAM: CT HEAD WITHOUT CONTRAST TECHNIQUE: Contiguous axial images were obtained from the base of the skull through the vertex without intravenous contrast. COMPARISON:  10/03/2017 FINDINGS: Brain: Coarse calcifications in the cerebellum and to a lesser extent occipital lobes and pons are unchanged and likely related to remote radiation therapy. A right parietal approach ventriculostomy catheter terminates at the level of the left caudate nucleus, unchanged. The ventricles are unchanged in size and  configuration without dilatation. Linear calcification in the right frontal lobe is unchanged and may be related to a prior ventriculostomy catheter. Chronic lacunar infarcts are again seen in the right thalamus and basal ganglia. A lacunar infarct in the posterior inferior left thalamus is new. No acute large territory infarct, intracranial hemorrhage, or midline shift is identified. Low-density subdural fluid collections are again seen bilaterally measuring up to 3 mm in thickness on the right and 4 mm on the left with the left-sided collection being smaller than on the prior study. Vascular: No hyperdense vessel. Skull: Suboccipital craniectomy. Sinuses/Orbits: Visualized paranasal sinuses and mastoid air cells are clear. Visualized orbits are unremarkable. Other: None. IMPRESSION: 1. New left thalamic lacunar infarct. 2. No evidence of acute large territory infarct or intracranial hemorrhage. 3. Unchanged appearance of the ventricles and VP shunt catheter. 4. Small chronic bilateral subdural fluid collections, slightly decreased in size on the left. Electronically Signed   By: Logan Bores M.D.   On: 06/07/2018 12:20   US Carotid Bilateral  Result Date: 06/08/2018 CLINICAL DATA:  Cerebral infarction EXAM: BILATERAL CAROTID DUPLEX ULTRASOUND TECHNIQUE: Pearline Cables scale imaging, color Doppler and duplex ultrasound were performed of bilateral carotid and vertebral arteries in the neck. COMPARISON:  None. FINDINGS: Criteria: Quantification of carotid stenosis is based on velocity parameters that correlate the residual internal carotid diameter with NASCET-based stenosis levels, using the diameter of the distal internal carotid lumen as the denominator for stenosis measurement. The following velocity measurements were obtained: RIGHT ICA: 52 cm/sec CCA: 841 cm/sec SYSTOLIC ICA/CCA RATIO:  0.5 ECA: 105 cm/sec LEFT ICA: 40 cm/sec CCA: 660 cm/sec SYSTOLIC ICA/CCA RATIO:  0.4 ECA: 93 cm/sec RIGHT CAROTID ARTERY: Little if  any plaque in the bulb. Low resistance internal carotid Doppler pattern is preserved. RIGHT VERTEBRAL ARTERY:  Antegrade. LEFT CAROTID ARTERY: Little if any plaque in the bulb. Low resistance internal carotid Doppler pattern is preserved. LEFT VERTEBRAL ARTERY:  Antegrade. IMPRESSION: Less than 50% stenosis in the right and left internal carotid arteries. Electronically Signed   By: Marybelle Killings M.D.   On: 06/08/2018 11:48      Allergies as of 06/08/2018      Reactions   Baclofen    Urinary retension   Seroquel [quetiapine]    Weight gain   Sulfa Antibiotics    Coconut Flavor Rash   ANYTHING COCONUT   Contrast Media [iodinated Diagnostic Agents] Rash      Medication List    STOP taking these medications   HYDROcodone-acetaminophen 5-325  MG tablet Commonly known as:  NORCO/VICODIN   ranolazine 1000 MG SR tablet Commonly known as:  RANEXA     TAKE these medications   albuterol 108 (90 Base) MCG/ACT inhaler Commonly known as:  PROVENTIL HFA;VENTOLIN HFA Inhale 1-2 puffs into the lungs every 6 (six) hours as needed for wheezing or shortness of breath.   ALPRAZolam 0.5 MG tablet Commonly known as:  XANAX Take 0.5 mg by mouth 2 (two) times daily.   ARIPiprazole 5 MG tablet Commonly known as:  ABILIFY Take 5 mg by mouth at bedtime.   aspirin 325 MG EC tablet Take 1 tablet (325 mg total) by mouth daily. Start taking on:  06/09/2018   escitalopram 20 MG tablet Commonly known as:  LEXAPRO Take 20 mg by mouth daily.   fluticasone furoate-vilanterol 100-25 MCG/INH Aepb Commonly known as:  BREO ELLIPTA Inhale 1 puff into the lungs daily.   ibuprofen 200 MG tablet Commonly known as:  ADVIL,MOTRIN Take 800 mg by mouth every 6 (six) hours as needed for mild pain or moderate pain.   lamoTRIgine 100 MG tablet Commonly known as:  LAMICTAL TAKE 1 TABLET BY MOUTH TWICE DAILY   levothyroxine 50 MCG tablet Commonly known as:  SYNTHROID, LEVOTHROID Take 50 mcg by mouth daily before  breakfast.   NUEDEXTA 20-10 MG Caps Generic drug:  Dextromethorphan-quiNIDine Take 1 capsule by mouth 2 (two) times daily.   omega-3 fish oil 1000 MG Caps capsule Commonly known as:  MAXEPA Take 1 capsule by mouth 2 (two) times daily.   omeprazole 20 MG capsule Commonly known as:  PRILOSEC Take 20 mg by mouth daily.   rosuvastatin 20 MG tablet Commonly known as:  CRESTOR Take 1 tablet (20 mg total) by mouth daily. Start taking on:  06/09/2018   topiramate 50 MG tablet Commonly known as:  TOPAMAX TAKE 1 TABLET BY MOUTH IN THE MORNING AND 2 IN THE EVENING What changed:    how much to take  how to take this  when to take this         Management plans discussed with the patient and he is in agreement. Stable for discharge   Patient should follow up with neurology  CODE STATUS:     Code Status Orders  (From admission, onward)         Start     Ordered   06/07/18 1629  Full code  Continuous     06/07/18 1628        Code Status History    This patient has a current code status but no historical code status.    Advance Directive Documentation     Most Recent Value  Type of Advance Directive  Healthcare Power of Attorney  Pre-existing out of facility DNR order (yellow form or pink MOST form)  -  "MOST" Form in Place?  -      TOTAL TIME TAKING CARE OF THIS PATIENT: 38 minutes.    Note: This dictation was prepared with Dragon dictation along with smaller phrase technology. Any transcriptional errors that result from this process are unintentional.  Jaella Weinert M.D on 06/08/2018 at 12:28 PM  Between 7am to 6pm - Pager - [phone] After 6pm go to [email or web address] - password [email or web address] [facility]  Office  [phone]  CC: Primary care physician; Isaias Sakai, DO

## 2018-06-08 NOTE — Consult Note (Signed)
Referring Physician: Dr. Marthenia Rolling    Chief Complaint: Right sided numbness  HPI: [name] Love is an 47 y.o. male who presented to [facility] with a 4 day history of numbness involving the right side of his face and body in conjunction with RUE and RLE weakness. He also endorses having double vision. CT head at [facility] revealed a new left thalamic lacunar infarction. 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 Therefore, the patient was transferred to [facility] for MRI.   He has a history of seizures. He takes Lamictal 100 mg BID and Topamax 50 qam and 100 qhs at home.   PMHx also includes anxiety, abnormal gait with multiple falls, sensorineural deafness, vitamin B12 deficiency and hypothyroidism.   The patient was started on ASA and rosuvastatin at [facility]. Of note, his friend states that patient has a poor recall of what meds he takes and needs assistance to take his meds at home.   Admission history from [facility] (9/4) reviewed: "[name] Love  is a 47 y.o. 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  Patient says he fell about 5 days ago and developed some numbness and weakness to his right side and thought it was related to his fall.  He has also been complaining of double vision but that has been going on for months and he has seen an ophthalmologist who has referred him to another specialist in [place].  His numbness although began 3 days ago and therefore he was a bit concerned and came to the ER for further evaluation.  Patient denies any headache, fever, chills or any other associated symptoms.  He does have some nausea and some intermittent vomiting which has now resolved.  Patient underwent a CT of  his head which was positive for acute thalamic CVA.  Hospitalist services were contacted for admission."  Past Medical History:  Diagnosis Date  . Abnormal gait   . Allergic rhinitis   . Anxiety   . Bilateral arm weakness   . Brain cancer ([place])   . Depression   . Ependymoma of brain ([place])   . Hearing loss, sensorineural   . Hypothyroidism   . Insomnia   . Left leg weakness   . Low vitamin B12 level   . Seizures ([place])     Past Surgical History:  Procedure Laterality Date  . BRAIN SURGERY    . HERNIA REPAIR    . SHUNT REVISION      Family History  Problem Relation Age of Onset  . Brain cancer Mother   . High blood pressure Mother    Social History:  reports that he has never smoked. He has never used smokeless tobacco. He reports that he does not drink alcohol or use drugs.  Allergies:  Allergies  Allergen Reactions  . Baclofen     Urinary retension  . Seroquel [Quetiapine]     Weight gain  . Sulfa Antibiotics   . Coconut Flavor Rash    ANYTHING COCONUT  . Contrast Media [Iodinated Diagnostic Agents] Rash    Medications:  Prior to Admission:  Medications Prior to Admission  Medication Sig Dispense Refill Last Dose  . albuterol (PROVENTIL HFA;VENTOLIN HFA) 108 (90 Base) MCG/ACT inhaler Inhale 1-2  puffs into the lungs every 6 (six) hours as needed for wheezing or shortness of breath. 1 Inhaler 5 PRN at PRN  . ALPRAZolam (XANAX) 0.5 MG tablet Take 0.5 mg by mouth 2 (two) times daily.  2 06/07/2018 at Unknown time  . ARIPiprazole (ABILIFY) 5 MG tablet Take 5 mg by mouth at bedtime.   06/06/2018 at Unknown time  . [START ON 06/09/2018] aspirin EC 325 MG EC tablet Take 1 tablet (325 mg total) by mouth daily. 30 tablet 0   . escitalopram (LEXAPRO) 20 MG tablet Take 20 mg by mouth daily.   2 06/07/2018 at Unknown time  . fluticasone furoate-vilanterol (BREO ELLIPTA) 100-25 MCG/INH AEPB Inhale 1 puff into the lungs daily. 1 each 5 PRN at PRN  . ibuprofen (ADVIL,MOTRIN) 200 MG  tablet Take 800 mg by mouth every 6 (six) hours as needed for mild pain or moderate pain.   PRN at PRN  . lamoTRIgine (LAMICTAL) 100 MG tablet TAKE 1 TABLET BY MOUTH TWICE DAILY 180 tablet 1 06/07/2018 at Unknown time  . levothyroxine (SYNTHROID, LEVOTHROID) 50 MCG tablet Take 50 mcg by mouth daily before breakfast.   3 06/07/2018 at Unknown time  . NUEDEXTA 20-10 MG CAPS Take 1 capsule by mouth 2 (two) times daily.  3 06/07/2018 at Unknown time  . omega-3 fish oil (MAXEPA) 1000 MG CAPS capsule Take 1 capsule by mouth 2 (two) times daily.   06/07/2018 at Unknown time  . omeprazole (PRILOSEC) 20 MG capsule Take 20 mg by mouth daily.   06/07/2018 at Unknown time  . [START ON 06/09/2018] rosuvastatin (CRESTOR) 20 MG tablet Take 1 tablet (20 mg total) by mouth daily.     Marland Kitchen topiramate (TOPAMAX) 50 MG tablet TAKE 1 TABLET BY MOUTH IN THE MORNING AND 2 IN THE EVENING (Patient taking differently: Take 50-100 mg by mouth 2 (two) times daily. TAKE 1 TABLET BY MOUTH IN THE MORNING AND 2 IN THE EVENING) 90 tablet 5 06/07/2018 at Unknown time   Scheduled: . ALPRAZolam  0.5 mg Oral BID  . ARIPiprazole  5 mg Oral QHS  . [START ON 06/09/2018] aspirin  325 mg Oral Daily  . Dextromethorphan-quiNIDine  1 capsule Oral BID  . [START ON 06/09/2018] enoxaparin (LOVENOX) injection  40 mg Subcutaneous Q24H  . [START ON 06/09/2018] escitalopram  20 mg Oral Daily  . [START ON 06/09/2018] fluticasone furoate-vilanterol  1 puff Inhalation Daily  . lamoTRIgine  100 mg Oral BID  . [START ON 06/09/2018] levothyroxine  50 mcg Oral QAC breakfast  . omega-3 acid ethyl esters  1 g Oral BID  . [START ON 06/09/2018] pantoprazole  40 mg Oral Daily  . potassium chloride  40 mEq Oral Once  . [START ON 06/09/2018] rosuvastatin  20 mg Oral Daily  . topiramate  100 mg Oral QPM  . [START ON 06/09/2018] topiramate  50 mg Oral q morning - 10a     ROS: No headache or neck pain. No CP or SOB. Denies fever. Other ROS as per HPI.   Physical Examination:  There were  no vitals taken for this visit.  HEENT: Normocephalic Lungs: Respirations unlabored Ext: Warm and well perfused. No edema.   Neurologic Examination: Mental Status: Alert, oriented to circumstance, day, month, year, state and city. Somewhat odd, mildly flattened affect. Speech fluent without evidence of aphasia.  Able to follow all commands without difficulty, except has right/left confusion with a 2-step directional command. Cranial Nerves: II:  Visual fields intact. PERRL.  III,IV, VI: Subtle dysconjugate gaze revealed by cover-uncover test and patient ensorsement of double vision which resolves when either eye is held shut. Otherwise, horizontal and vertical pursuits are full without nystagmus. No ptosis.  V,VII: No facial droop. Decreased temp sensation on right.  VIII: HOH, wears bilateral hearing aids IX,X: No hypophonia or hoarseness XI: Symmetric XII: midline tongue extension  Motor: RUE 4/5 proximal and distal RLE: 4+/5 knee ext, otherwise 5/5 LUE and LLE: 5/5 No pronator drift Sensory: Decreased temp and FT sensation to RUE and RLE.  Deep Tendon Reflexes:  No pathological reflexes. Slight asymmetry noted.  Plantars: Right: downgoing   Left: downgoing Cerebellar: Mild ataxia with right FNF. Normal on the left.  Gait: Deferred  Results for orders placed or performed during the hospital encounter of 06/07/18 (from the past 48 hour(s))  Protime-INR     Status: None   Collection Time: 06/07/18 11:02 AM  Result Value Ref Range   Prothrombin Time 13.2 11.4 - 15.2 seconds   INR 1.01     Comment: Performed at [facility], [address] [place], [place] [address]  APTT     Status: None   Collection Time: 06/07/18 11:02 AM  Result Value Ref Range   aPTT 31 24 - 36 seconds    Comment: Performed at [facility], [address] [place], [place] [address]  Comprehensive metabolic panel     Status: Abnormal   Collection Time: 06/07/18 11:02 AM  Result Value  Ref Range   Sodium 138 135 - 145 mmol/L   Potassium 3.5 3.5 - 5.1 mmol/L   Chloride 107 98 - 111 mmol/L   CO2 22 22 - 32 mmol/L   Glucose, Bld 122 (H) 70 - 99 mg/dL   BUN 13 6 - 20 mg/dL   Creatinine, Ser 1.41 (H) 0.61 - 1.24 mg/dL   Calcium 8.7 (L) 8.9 - 10.3 mg/dL   Total Protein 7.0 6.5 - 8.1 g/dL   Albumin 4.2 3.5 - 5.0 g/dL   AST 21 15 - 41 U/L   ALT 17 0 - 44 U/L   Alkaline Phosphatase 95 38 - 126 U/L   Total Bilirubin 0.6 0.3 - 1.2 mg/dL   GFR calc non Af Amer 59 (L) >60 mL/min   GFR calc Af Amer >60 >60 mL/min    Comment: (NOTE) The eGFR has been calculated using the CKD EPI equation. This calculation has not been validated in all clinical situations. eGFR's persistently <60 mL/min signify possible Chronic Kidney Disease.    Anion gap 9 5 - 15    Comment: Performed at [facility], [address] [place], [place] [address]  Troponin I     Status: None   Collection Time: 06/07/18 11:02 AM  Result Value Ref Range   Troponin I <0.03 <0.03 ng/mL    Comment: Performed at [facility], [address] [place], [place] [address]  CBC with Differential/Platelet     Status: None   Collection Time: 06/07/18 11:27 AM  Result Value Ref Range   WBC 8.2 3.8 - 10.6 K/uL   RBC 4.87 4.40 - 5.90 MIL/uL   Hemoglobin 15.7 13.0 - 18.0 g/dL   HCT 45.3 40.0 - 52.0 %   MCV 92.9 80.0 - 100.0 fL   MCH 32.2 26.0 - 34.0 pg   MCHC 34.6 32.0 - 36.0 g/dL   RDW 13.0 11.5 - 14.5 %   Platelets 240 150 - 440 K/uL   Neutrophils Relative %  73 %   Neutro Abs 6.1 1.4 - 6.5 K/uL   Lymphocytes Relative 17 %   Lymphs Abs 1.4 1.0 - 3.6 K/uL   Monocytes Relative 7 %   Monocytes Absolute 0.6 0.2 - 1.0 K/uL   Eosinophils Relative 2 %   Eosinophils Absolute 0.2 0 - 0.7 K/uL   Basophils Relative 1 %   Basophils Absolute 0.0 0 - 0.1 K/uL    Comment: Performed at [facility], [address] [place], [place] [address]  Basic metabolic panel     Status: Abnormal    Collection Time: 06/08/18  4:28 AM  Result Value Ref Range   Sodium 141 135 - 145 mmol/L   Potassium 3.3 (L) 3.5 - 5.1 mmol/L   Chloride 108 98 - 111 mmol/L   CO2 26 22 - 32 mmol/L   Glucose, Bld 105 (H) 70 - 99 mg/dL   BUN 13 6 - 20 mg/dL   Creatinine, Ser 1.30 (H) 0.61 - 1.24 mg/dL   Calcium 8.6 (L) 8.9 - 10.3 mg/dL   GFR calc non Af Amer >60 >60 mL/min   GFR calc Af Amer >60 >60 mL/min    Comment: (NOTE) The eGFR has been calculated using the CKD EPI equation. This calculation has not been validated in all clinical situations. eGFR's persistently <60 mL/min signify possible Chronic Kidney Disease.    Anion gap 7 5 - 15    Comment: Performed at [facility], [address] [place], [place] [address]  CBC     Status: None   Collection Time: 06/08/18  4:28 AM  Result Value Ref Range   WBC 7.2 3.8 - 10.6 K/uL   RBC 4.57 4.40 - 5.90 MIL/uL   Hemoglobin 14.8 13.0 - 18.0 g/dL   HCT 42.7 40.0 - 52.0 %   MCV 93.5 80.0 - 100.0 fL   MCH 32.3 26.0 - 34.0 pg   MCHC 34.6 32.0 - 36.0 g/dL   RDW 13.1 11.5 - 14.5 %   Platelets 213 150 - 440 K/uL    Comment: Performed at [facility], [address] [place], [place] [address]  Lipid panel     Status: Abnormal   Collection Time: 06/08/18  4:28 AM  Result Value Ref Range   Cholesterol 151 0 - 200 mg/dL   Triglycerides 277 (H) <150 mg/dL   HDL 27 (L) >40 mg/dL   Total CHOL/HDL Ratio 5.6 RATIO   VLDL 55 (H) 0 - 40 mg/dL   LDL Cholesterol 69 0 - 99 mg/dL    Comment:        Total Cholesterol/HDL:CHD Risk Coronary Heart Disease Risk Table                     Men   Women  1/2 Average Risk   3.4   3.3  Average Risk       5.0   4.4  2 X Average Risk   9.6   7.1  3 X Average Risk  23.4   11.0        Use the calculated Patient Ratio above and the CHD Risk Table to determine the patient's CHD Risk.        ATP III CLASSIFICATION (LDL):  <100     mg/dL   Optimal  100-129  mg/dL   Near or Above                     Optimal  130-159  mg/dL   Borderline  160-189  mg/dL   High  >190     mg/dL   Very High Performed at [facility], [address] [place], [place] [address]   Hemoglobin A1c     Status: None   Collection Time: 06/08/18  4:28 AM  Result Value Ref Range   Hgb A1c MFr Bld 5.4 4.8 - 5.6 %    Comment: (NOTE) Pre diabetes:          5.7%-6.4% Diabetes:              >6.4% Glycemic control for   <7.0% adults with diabetes    Mean Plasma Glucose 108.28 mg/dL    Comment: Performed at [facility] [address] [place], [place] [address]   Dg Skull 1-3 Views  Result Date: 06/07/2018 CLINICAL DATA:  47 year old with indwelling ventriculovenous shunt who presents with RIGHT facial and body numbness that began approximately 5 days ago associated with mild weakness and diplopia. Evaluate the continuity of the shunt catheter. EXAM: SKULL - 1-3 VIEW CHEST  1 VIEW ABDOMEN - 1 VIEW COMPARISON:  CT head earlier same day and previously. Chest x-ray 04/03/2018 and earlier. Abdominal x-rays 11/14/2012. FINDINGS: Ventriculovenous shunt catheter entering from a RIGHT parietal approach with its tip across the midline as noted on the earlier CT head. Shunt catheter tubing appears continuous in the head and neck. No focal osseous abnormalities involving the skull. Paranasal sinuses well aerated. Ventriculovenous shunt catheter with its tip overlying the LOWER SVC, unchanged. The shunt catheter appears intact. Cardiomediastinal silhouette unremarkable, unchanged. Lungs clear. Bronchovascular markings normal. Pulmonary vascularity normal. No visible pleural effusions. No pneumothorax. Bowel gas pattern unremarkable without evidence of obstruction or significant ileus. Moderate stool burden in the colon. Phlebolith low in the RIGHT side of the pelvis. No visible opaque urinary tract calculi. IMPRESSION: 1. Ventriculovenous shunt catheter tubing appears intact with its tip in the LOWER SVC, unchanged. 2.  No  acute cardiopulmonary disease. 3. No acute abdominal abnormality. . Electronically Signed   By: Evangeline Dakin M.D.   On: 06/07/2018 13:20   Dg Chest 1 View  Result Date: 06/07/2018 CLINICAL DATA:  47 year old with indwelling ventriculovenous shunt who presents with RIGHT facial and body numbness that began approximately 5 days ago associated with mild weakness and diplopia. Evaluate the continuity of the shunt catheter. EXAM: SKULL - 1-3 VIEW CHEST  1 VIEW ABDOMEN - 1 VIEW COMPARISON:  CT head earlier same day and previously. Chest x-ray 04/03/2018 and earlier. Abdominal x-rays 11/14/2012. FINDINGS: Ventriculovenous shunt catheter entering from a RIGHT parietal approach with its tip across the midline as noted on the earlier CT head. Shunt catheter tubing appears continuous in the head and neck. No focal osseous abnormalities involving the skull. Paranasal sinuses well aerated. Ventriculovenous shunt catheter with its tip overlying the LOWER SVC, unchanged. The shunt catheter appears intact. Cardiomediastinal silhouette unremarkable, unchanged. Lungs clear. Bronchovascular markings normal. Pulmonary vascularity normal. No visible pleural effusions. No pneumothorax. Bowel gas pattern unremarkable without evidence of obstruction or significant ileus. Moderate stool burden in the colon. Phlebolith low in the RIGHT side of the pelvis. No visible opaque urinary tract calculi. IMPRESSION: 1. Ventriculovenous shunt catheter tubing appears intact with its tip in the LOWER SVC, unchanged. 2.  No acute cardiopulmonary disease. 3. No acute abdominal abnormality. . Electronically Signed   By: Evangeline Dakin M.D.   On: 06/07/2018 13:20   Dg Abd 1 View  Result Date:  06/07/2018 CLINICAL DATA:  47 year old with indwelling ventriculovenous shunt who presents with RIGHT facial and body numbness that began approximately 5 days ago associated with mild weakness and diplopia. Evaluate the continuity of the shunt catheter.  EXAM: SKULL - 1-3 VIEW CHEST  1 VIEW ABDOMEN - 1 VIEW COMPARISON:  CT head earlier same day and previously. Chest x-ray 04/03/2018 and earlier. Abdominal x-rays 11/14/2012. FINDINGS: Ventriculovenous shunt catheter entering from a RIGHT parietal approach with its tip across the midline as noted on the earlier CT head. Shunt catheter tubing appears continuous in the head and neck. No focal osseous abnormalities involving the skull. Paranasal sinuses well aerated. Ventriculovenous shunt catheter with its tip overlying the LOWER SVC, unchanged. The shunt catheter appears intact. Cardiomediastinal silhouette unremarkable, unchanged. Lungs clear. Bronchovascular markings normal. Pulmonary vascularity normal. No visible pleural effusions. No pneumothorax. Bowel gas pattern unremarkable without evidence of obstruction or significant ileus. Moderate stool burden in the colon. Phlebolith low in the RIGHT side of the pelvis. No visible opaque urinary tract calculi. IMPRESSION: 1. Ventriculovenous shunt catheter tubing appears intact with its tip in the LOWER SVC, unchanged. 2.  No acute cardiopulmonary disease. 3. No acute abdominal abnormality. . Electronically Signed   By: Evangeline Dakin M.D.   On: 06/07/2018 13:20   Ct Head Wo Contrast  Result Date: 06/07/2018 CLINICAL DATA:  Right-sided body numbness for 5 days. Double vision. History of brain tumor and VP shunt. EXAM: CT HEAD WITHOUT CONTRAST TECHNIQUE: Contiguous axial images were obtained from the base of the skull through the vertex without intravenous contrast. COMPARISON:  10/03/2017 FINDINGS: Brain: Coarse calcifications in the cerebellum and to a lesser extent occipital lobes and pons are unchanged and likely related to remote radiation therapy. A right parietal approach ventriculostomy catheter terminates at the level of the left caudate nucleus, unchanged. The ventricles are unchanged in size and configuration without dilatation. Linear calcification in the  right frontal lobe is unchanged and may be related to a prior ventriculostomy catheter. Chronic lacunar infarcts are again seen in the right thalamus and basal ganglia. A lacunar infarct in the posterior inferior left thalamus is new. No acute large territory infarct, intracranial hemorrhage, or midline shift is identified. Low-density subdural fluid collections are again seen bilaterally measuring up to 3 mm in thickness on the right and 4 mm on the left with the left-sided collection being smaller than on the prior study. Vascular: No hyperdense vessel. Skull: Suboccipital craniectomy. Sinuses/Orbits: Visualized paranasal sinuses and mastoid air cells are clear. Visualized orbits are unremarkable. Other: None. IMPRESSION: 1. New left thalamic lacunar infarct. 2. No evidence of acute large territory infarct or intracranial hemorrhage. 3. Unchanged appearance of the ventricles and VP shunt catheter. 4. Small chronic bilateral subdural fluid collections, slightly decreased in size on the left. Electronically Signed   By: Logan Bores M.D.   On: 06/07/2018 12:20   US Carotid Bilateral  Result Date: 06/08/2018 CLINICAL DATA:  Cerebral infarction EXAM: BILATERAL CAROTID DUPLEX ULTRASOUND TECHNIQUE: Pearline Cables scale imaging, color Doppler and duplex ultrasound were performed of bilateral carotid and vertebral arteries in the neck. COMPARISON:  None. FINDINGS: Criteria: Quantification of carotid stenosis is based on velocity parameters that correlate the residual internal carotid diameter with NASCET-based stenosis levels, using the diameter of the distal internal carotid lumen as the denominator for stenosis measurement. The following velocity measurements were obtained: RIGHT ICA: 52 cm/sec CCA: 213 cm/sec SYSTOLIC ICA/CCA RATIO:  0.5 ECA: 105 cm/sec LEFT ICA: 40 cm/sec CCA: 100 cm/sec  SYSTOLIC ICA/CCA RATIO:  0.4 ECA: 93 cm/sec RIGHT CAROTID ARTERY: Little if any plaque in the bulb. Low resistance internal carotid Doppler  pattern is preserved. RIGHT VERTEBRAL ARTERY:  Antegrade. LEFT CAROTID ARTERY: Little if any plaque in the bulb. Low resistance internal carotid Doppler pattern is preserved. LEFT VERTEBRAL ARTERY:  Antegrade. IMPRESSION: Less than 50% stenosis in the right and left internal carotid arteries. Electronically Signed   By: Marybelle Killings M.D.   On: 06/08/2018 11:48    Assessment: 47 y.o. male presenting with subacute left thalamic lacunar infarction 1. Exam findings of right sided weakness and sensory loss are consistent with the CT finding of new left thalamic lacunar infarction 2. Stroke Risk Factors - None 3. History of seizures. Well controlled on Lamictal and Topamax, per patient. 4. History of recurrent intracranial ependymoma, s/p resections and ventricular shunting requiring revisions  5. Sensorineural hearing loss bilaterally as chronic complication of prior cranial radiation therapy for ependymoma. 6. New onset double vision. Most likely from dorsal midbrain involvement near/adjacent to the thalamic stroke visible on CT  Plan: 1. HgbA1c, fasting lipid panel 2. MRI of the brain with contrast in AM with neurosurgery assistance for activation/deactivation of programmable shunt. MRI  3. MRA of the brain without contrast at the time of brain MRI 4. Echocardiogram 5. Carotid dopplers 6. Prophylactic therapy- continue ASA and statin.  7. Risk factor modification 8. Telemetry monitoring 9. Frequent neuro checks 10. Continue Topamax and Lamictal for seizure prophylaxis 11. BP management 12. PT consult, OT consult, Speech consult    '@Electronically'$  signed: Dr. Kerney Elbe  06/08/2018, 7:59 PM

## 2018-06-08 NOTE — Progress Notes (Signed)
PT Cancellation Note  Patient Details Name: Khori Rosevear MRN: [id number] DOB: [birth year]   Cancelled Treatment:    Reason Eval/Treat Not Completed: Fatigue/lethargy limiting ability to participate.  Order received.  Chart reviewed.  Pt sleeping upon PT arrival and difficult to arouse verbally or with touch.  Will re-attempt later when pt is more appropriate.   Roxanne Gates, PT, DPT 06/08/2018, 8:24 AM

## 2018-06-08 NOTE — Progress Notes (Signed)
OT Cancellation Note  Patient Details Name: [name] Love MRN: [id number] DOB: 04/10/1971   Cancelled Treatment:    Reason Eval/Treat Not Completed: Patient declined, no reason specified. Order received, chart reviewed. Pt just starting to eat breakfast upon OT's arrival. Pt kindly requesting for OT to come back after he has an opportunity to finish breakfast. OT will re-attempt at later time.   Jeni Salles, MPH, MS, OTR/L ascom [phone] 06/08/18, 9:14 AM

## 2018-06-08 NOTE — Progress Notes (Signed)
Called from [facility] for transfer request.  Patient is a 47yo with h/o seizure d/o; hypothyroidism; and ependymoma of the brain (malignant) s/p neurosurgery who presented to the ER yesterday with right-sided numbness and diplopia.  CT showed an acute thalamic CVA.  Patient has a VP shunt and cannot have an MRI at their facility because of this issue.  Neurology here has agreed to see in consultation.  I asked whether it was appropriate for the patient to come here for MRI and then be transported back to [facility] and was told that there may be other issues that need to be addressed and his primary neurologist (Dr. Jannifer Franklin) is in our system.  Will accept as an inpatient transfer to telemetry.  Carlyon Shadow, M.D.

## 2018-06-08 NOTE — Progress Notes (Signed)
PT Cancellation Note  Patient Details Name: Tavi Hoogendoorn MRN: [id number] DOB: [birth year]   Cancelled Treatment:    Reason Eval/Treat Not Completed: Patient at procedure or test/unavailable.  Patient is currently with imaging.  Will re-attempt later if time allows.   Roxanne Gates, PT, DPT 06/08/2018, 10:31 AM

## 2018-06-08 NOTE — Progress Notes (Signed)
[facility] NAME: [name] Love    MR#:  [id number]  DATE OF BIRTH:  October 17, 1970  SUBJECTIVE:   Patient reports he has a VP shunt.  He still has right-sided weakness.  REVIEW OF SYSTEMS:    Review of Systems  Constitutional: Negative for fever, chills weight loss HENT: Negative for ear pain, nosebleeds, congestion, facial swelling, rhinorrhea, neck pain, neck stiffness and ear discharge.   Respiratory: Negative for cough, shortness of breath, wheezing  Cardiovascular: Negative for chest pain, palpitations and leg swelling.  Gastrointestinal: Negative for heartburn, abdominal pain, vomiting, diarrhea or consitpation Genitourinary: Negative for dysuria, urgency, frequency, hematuria Musculoskeletal: Negative for back pain or joint pain Neurological: Negative for dizziness, seizures, syncope, ,  numbness and headaches.   Positive right-sided weakness Hematological: Does not bruise/bleed easily.  Psychiatric/Behavioral: Negative for hallucinations, confusion, dysphoric mood    Tolerating Diet: yes      DRUG ALLERGIES:   Allergies  Allergen Reactions  . Baclofen     Urinary retension  . Seroquel [Quetiapine]     Weight gain  . Sulfa Antibiotics   . Coconut Flavor Rash    ANYTHING COCONUT  . Contrast Media [Iodinated Diagnostic Agents] Rash    VITALS:  Blood pressure 117/78, pulse 83, temperature 98.4 F (36.9 C), temperature source Oral, resp. rate 18, height 5\' 6"  (1.676 m), weight 98 kg, SpO2 97 %.  PHYSICAL EXAMINATION:  Constitutional: Appears well-developed and well-nourished. No distress. HENT: Normocephalic. Marland Kitchen Oropharynx is clear and moist.  Eyes: Conjunctivae and EOM are normal. PERRLA, no scleral icterus.  Neck: Normal ROM. Neck supple. No JVD. No tracheal deviation. CVS: RRR, S1/S2 +, no murmurs, no gallops, no carotid bruit.  Pulmonary: Effort and breath sounds normal, no stridor, rhonchi, wheezes, rales.   Abdominal: Soft. BS +,  no distension, tenderness, rebound or guarding.  Musculoskeletal: Normal range of motion. No edema and no tenderness.  Right lower extremity 4 out of 5 right upper extremity 4-5 Neuro: Alert. CN 2-12 grossly intact. No focal deficits. Skin: Skin is warm and dry. No rash noted. Psychiatric: Normal mood and affect.      LABORATORY PANEL:   CBC Recent Labs  Lab 06/08/18 0428  WBC 7.2  HGB 14.8  HCT 42.7  PLT 213   ------------------------------------------------------------------------------------------------------------------  Chemistries  Recent Labs  Lab 06/07/18 1102 06/08/18 0428  NA 138 141  K 3.5 3.3*  CL 107 108  CO2 22 26  GLUCOSE 122* 105*  BUN 13 13  CREATININE 1.41* 1.30*  CALCIUM 8.7* 8.6*  AST 21  --   ALT 17  --   ALKPHOS 95  --   BILITOT 0.6  --    ------------------------------------------------------------------------------------------------------------------  Cardiac Enzymes Recent Labs  Lab 06/07/18 1102  TROPONINI <0.03   ------------------------------------------------------------------------------------------------------------------  RADIOLOGY:  Dg Skull 1-3 Views  Result Date: 06/07/2018 CLINICAL DATA:  47 year old with indwelling ventriculovenous shunt who presents with RIGHT facial and body numbness that began approximately 5 days ago associated with mild weakness and diplopia. Evaluate the continuity of the shunt catheter. EXAM: SKULL - 1-3 VIEW CHEST  1 VIEW ABDOMEN - 1 VIEW COMPARISON:  CT head earlier same day and previously. Chest x-ray 04/03/2018 and earlier. Abdominal x-rays 11/14/2012. FINDINGS: Ventriculovenous shunt catheter entering from a RIGHT parietal approach with its tip across the midline as noted on the earlier CT head. Shunt catheter tubing appears continuous in the head and neck. No focal osseous abnormalities involving the  skull. Paranasal sinuses well aerated. Ventriculovenous shunt catheter  with its tip overlying the LOWER SVC, unchanged. The shunt catheter appears intact. Cardiomediastinal silhouette unremarkable, unchanged. Lungs clear. Bronchovascular markings normal. Pulmonary vascularity normal. No visible pleural effusions. No pneumothorax. Bowel gas pattern unremarkable without evidence of obstruction or significant ileus. Moderate stool burden in the colon. Phlebolith low in the RIGHT side of the pelvis. No visible opaque urinary tract calculi. IMPRESSION: 1. Ventriculovenous shunt catheter tubing appears intact with its tip in the LOWER SVC, unchanged. 2.  No acute cardiopulmonary disease. 3. No acute abdominal abnormality. . Electronically Signed   By: Evangeline Dakin M.D.   On: 06/07/2018 13:20   Dg Chest 1 View  Result Date: 06/07/2018 CLINICAL DATA:  47 year old with indwelling ventriculovenous shunt who presents with RIGHT facial and body numbness that began approximately 5 days ago associated with mild weakness and diplopia. Evaluate the continuity of the shunt catheter. EXAM: SKULL - 1-3 VIEW CHEST  1 VIEW ABDOMEN - 1 VIEW COMPARISON:  CT head earlier same day and previously. Chest x-ray 04/03/2018 and earlier. Abdominal x-rays 11/14/2012. FINDINGS: Ventriculovenous shunt catheter entering from a RIGHT parietal approach with its tip across the midline as noted on the earlier CT head. Shunt catheter tubing appears continuous in the head and neck. No focal osseous abnormalities involving the skull. Paranasal sinuses well aerated. Ventriculovenous shunt catheter with its tip overlying the LOWER SVC, unchanged. The shunt catheter appears intact. Cardiomediastinal silhouette unremarkable, unchanged. Lungs clear. Bronchovascular markings normal. Pulmonary vascularity normal. No visible pleural effusions. No pneumothorax. Bowel gas pattern unremarkable without evidence of obstruction or significant ileus. Moderate stool burden in the colon. Phlebolith low in the RIGHT side of the pelvis.  No visible opaque urinary tract calculi. IMPRESSION: 1. Ventriculovenous shunt catheter tubing appears intact with its tip in the LOWER SVC, unchanged. 2.  No acute cardiopulmonary disease. 3. No acute abdominal abnormality. . Electronically Signed   By: Evangeline Dakin M.D.   On: 06/07/2018 13:20   Dg Abd 1 View  Result Date: 06/07/2018 CLINICAL DATA:  47 year old with indwelling ventriculovenous shunt who presents with RIGHT facial and body numbness that began approximately 5 days ago associated with mild weakness and diplopia. Evaluate the continuity of the shunt catheter. EXAM: SKULL - 1-3 VIEW CHEST  1 VIEW ABDOMEN - 1 VIEW COMPARISON:  CT head earlier same day and previously. Chest x-ray 04/03/2018 and earlier. Abdominal x-rays 11/14/2012. FINDINGS: Ventriculovenous shunt catheter entering from a RIGHT parietal approach with its tip across the midline as noted on the earlier CT head. Shunt catheter tubing appears continuous in the head and neck. No focal osseous abnormalities involving the skull. Paranasal sinuses well aerated. Ventriculovenous shunt catheter with its tip overlying the LOWER SVC, unchanged. The shunt catheter appears intact. Cardiomediastinal silhouette unremarkable, unchanged. Lungs clear. Bronchovascular markings normal. Pulmonary vascularity normal. No visible pleural effusions. No pneumothorax. Bowel gas pattern unremarkable without evidence of obstruction or significant ileus. Moderate stool burden in the colon. Phlebolith low in the RIGHT side of the pelvis. No visible opaque urinary tract calculi. IMPRESSION: 1. Ventriculovenous shunt catheter tubing appears intact with its tip in the LOWER SVC, unchanged. 2.  No acute cardiopulmonary disease. 3. No acute abdominal abnormality. . Electronically Signed   By: Evangeline Dakin M.D.   On: 06/07/2018 13:20   Ct Head Wo Contrast  Result Date: 06/07/2018 CLINICAL DATA:  Right-sided body numbness for 5 days. Double vision. History of  brain tumor and VP shunt. EXAM: CT  HEAD WITHOUT CONTRAST TECHNIQUE: Contiguous axial images were obtained from the base of the skull through the vertex without intravenous contrast. COMPARISON:  10/03/2017 FINDINGS: Brain: Coarse calcifications in the cerebellum and to a lesser extent occipital lobes and pons are unchanged and likely related to remote radiation therapy. A right parietal approach ventriculostomy catheter terminates at the level of the left caudate nucleus, unchanged. The ventricles are unchanged in size and configuration without dilatation. Linear calcification in the right frontal lobe is unchanged and may be related to a prior ventriculostomy catheter. Chronic lacunar infarcts are again seen in the right thalamus and basal ganglia. A lacunar infarct in the posterior inferior left thalamus is new. No acute large territory infarct, intracranial hemorrhage, or midline shift is identified. Low-density subdural fluid collections are again seen bilaterally measuring up to 3 mm in thickness on the right and 4 mm on the left with the left-sided collection being smaller than on the prior study. Vascular: No hyperdense vessel. Skull: Suboccipital craniectomy. Sinuses/Orbits: Visualized paranasal sinuses and mastoid air cells are clear. Visualized orbits are unremarkable. Other: None. IMPRESSION: 1. New left thalamic lacunar infarct. 2. No evidence of acute large territory infarct or intracranial hemorrhage. 3. Unchanged appearance of the ventricles and VP shunt catheter. 4. Small chronic bilateral subdural fluid collections, slightly decreased in size on the left. Electronically Signed   By: Logan Bores M.D.   On: 06/07/2018 12:20     ASSESSMENT AND PLAN:   47 year old male with history of VP shunt and seizures who presents with right-sided weakness.  1.  Acute CVA: This may be etiology of patient's right-sided weakness/numbness Follow-up in neurology consultation Follow-up on CVA work-up  including MRI (if if patient is able to get MRI here at the hospital), Doppler and echocardiogram LDL 69 Continue aspirin and statin 2.  Diplopia: This seems to be a long term issue However given VP shunt patient will need MRI  3.  History of seizures: Continue Lamictal and Topamax  4.  Anxiety depression: Continue Xanax, Lexapro and Abilify  5.  Hypothyroidism: Continue Synthroid   Management plans discussed with the patient and he is in agreement.  CODE STATUS: Full  TOTAL TIME TAKING CARE OF THIS PATIENT: 28 minutes.     POSSIBLE D/C tomorrow, DEPENDING ON CLINICAL CONDITION.   [name] Love M.D on 06/08/2018 at 11:29 AM  Between 7am to 6pm - Pager - [phone] After 6pm go to [email or web address] - password [email or web address] [facility]  Office  [phone]  CC: Primary care physician; Isaias Sakai, DO  Note: This dictation was prepared with Dragon dictation along with smaller phrase technology. Any transcriptional errors that result from this process are unintentional.

## 2018-06-08 NOTE — Progress Notes (Signed)
Pt arrived to unit, MD notified

## 2018-06-08 NOTE — Progress Notes (Signed)
Report called to Olivia Mackie, Therapist, sports at [facility]. Report given to [facility]. EMTALA  Form signed by patient and placed in packet for [facility].  Clarise Cruz, RN, BSN

## 2018-06-09 ENCOUNTER — Inpatient Hospital Stay (HOSPITAL_COMMUNITY): Payer: Medicare Other

## 2018-06-09 DIAGNOSIS — I639 Cerebral infarction, unspecified: Secondary | ICD-10-CM

## 2018-06-09 LAB — BASIC METABOLIC PANEL
Anion gap: 8 (ref 5–15)
BUN: 11 mg/dL (ref 6–20)
CO2: 22 mmol/L (ref 22–32)
Calcium: 9 mg/dL (ref 8.9–10.3)
Chloride: 111 mmol/L (ref 98–111)
Creatinine, Ser: 1.4 mg/dL — ABNORMAL HIGH (ref 0.61–1.24)
GFR calc Af Amer: >60 mL/min (ref >60)
GFR calc non Af Amer: 60 mL/min — ABNORMAL LOW (ref >60)
Glucose, Bld: 100 mg/dL — ABNORMAL HIGH (ref 70–99)
Potassium: 3.6 mmol/L (ref 3.5–5.1)
Sodium: 141 mmol/L (ref 135–145)

## 2018-06-09 LAB — PHOSPHORUS: Phosphorus: 3.6 mg/dL (ref 2.5–4.6)

## 2018-06-09 LAB — CBC
HCT: 43.9 % (ref 39.0–52.0)
Hemoglobin: 14.6 g/dL (ref 13.0–17.0)
MCH: 31.5 pg (ref 26.0–34.0)
MCHC: 33.3 g/dL (ref 30.0–36.0)
MCV: 94.6 fL (ref 78.0–100.0)
Platelets: 217 10*3/uL (ref 150–400)
RBC: 4.64 MIL/uL (ref 4.22–5.81)
RDW: 12.2 % (ref 11.5–15.5)
WBC: 7.5 10*3/uL (ref 4.0–10.5)

## 2018-06-09 LAB — MAGNESIUM: Magnesium: 2.3 mg/dL (ref 1.7–2.4)

## 2018-06-09 MED ORDER — POLYETHYLENE GLYCOL 3350 17 G PO PACK
17.0000 g | PACK | Freq: Two times a day (BID) | ORAL | Status: DC
  Administered 2018-06-09: 17 g via ORAL
  Filled 2018-06-09 (×3): qty 1

## 2018-06-09 MED ORDER — SODIUM CHLORIDE 0.9 % IV BOLUS
500.0000 mL | Freq: Once | INTRAVENOUS | Status: AC
  Administered 2018-06-09: 500 mL via INTRAVENOUS

## 2018-06-09 MED ORDER — CLOPIDOGREL BISULFATE 75 MG PO TABS
75.0000 mg | ORAL_TABLET | Freq: Every day | ORAL | Status: DC
  Administered 2018-06-09 – 2018-06-11 (×3): 75 mg via ORAL
  Filled 2018-06-09 (×3): qty 1

## 2018-06-09 MED ORDER — ACETAMINOPHEN 325 MG PO TABS
650.0000 mg | ORAL_TABLET | Freq: Four times a day (QID) | ORAL | Status: DC | PRN
  Administered 2018-06-09 – 2018-06-10 (×3): 650 mg via ORAL
  Filled 2018-06-09 (×3): qty 2

## 2018-06-09 MED ORDER — ASPIRIN EC 81 MG PO TBEC
81.0000 mg | DELAYED_RELEASE_TABLET | Freq: Every day | ORAL | Status: DC
  Administered 2018-06-10 – 2018-06-11 (×2): 81 mg via ORAL
  Filled 2018-06-09 (×2): qty 1

## 2018-06-09 NOTE — Progress Notes (Signed)
MRI notified that per Dr. Annette Stable pt. Is cleared to go to MRI.

## 2018-06-09 NOTE — Progress Notes (Signed)
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  Patient was transferred here as he has a programmable VP shunt that needs to be reprogrammed after MRI.   Assessment & Plan:   Active Problems:   Acute CVA (cerebrovascular accident) ([place])   Seizure ([place])   Recurrent falls   Diplopia   #) Acute left thalamic CVA: Patient is not quite as symptomatic as he was prior to treatment admission at [facility]. -MRI brain, MRA head neck pending - Neurosurgery consult at [phone], Dr. Christella Noa to reprogram after MRI - Continue aspirin 325 daily -Continue rosuvastatin 20 mg daily -Hemoglobin A1c is 5.4 - Lipid panel shows LDL of 69 -Neurology consult, appreciate recommendations -PT/OT/speech line which pathology -Echo on 06/08/2018 normal  #) Posterior fossa tumor status post surgery, gated by hydrocephalus requiring multiple VP shunts: Currently patient reports a little bit of a headache this morning. -Acetaminophen as needed -Neurosurgery consult per above  #) Seizure disorder: -Continue lamotrigine 100 mg twice daily - Continue topiramate 50 mill grams every morning and 100 mg nightly  #) Behavioral disorder/pain/psych: - Continue escitalopram 20 mg daily -Continue aripiprazole 5 mg nightly -Continue alprazolam 0.5 mg twice daily -Continue dexamethasone-quinidine twice daily  #) Hypothyroidism: -Continue levothyroxine 50 mcg  daily  #) Asthma: -Continue ICS/LABA -Continue PRN bronchodilators  Fluids: Tolerating p.o. Electrolyte: Monitor and supplement Nutrition: Heart healthy diet  Prophylaxis: Enoxaparin  Disposition: Pending MRI and neurology consultation  Full code  Consultants:   Neurology  Neurosurgery  Procedures:  06/08/2018 echo: - Left ventricle: The cavity size was normal. Systolic function was   normal. The estimated ejection fraction was in the range of 55%   to 60%. Wall motion was normal; there were no regional wall   motion abnormalities.  Impressions:   - No cardiac source of emboli was indentified.  Antimicrobials:   None   Subjective: He unfortunately has intellectual disability and is quite hard of hearing due to his sensorineural deafness and so cannot provide much in the way of his symptomatology.  He will he reports he has a headache this morning.  He describes it as an 8 out of 10.  He is getting some IV fluids and some Tylenol.  He denies any cough, congestion, rhinorrhea.  He continues to report right lower extremity weakness.  Objective: Vitals:   06/09/18 0006 06/09/18 0422 06/09/18 0801  BP: 104/64 98/62 121/79  Pulse: 82 74 86  Resp: 18 18 16   Temp: 98.3 F (36.8 C) 97.7 F (36.5 C) 98.2 F (36.8 C)  TempSrc: Oral Oral Oral  SpO2: 96% 96% 98%    Intake/Output Summary (Last 24 hours) at 06/09/2018 1027 Last data filed at 06/09/2018 0729 Gross per 24 hour  Intake 420 ml  Output 2325 ml  Net -1905 ml   There were no vitals filed for this visit.  Examination:  General exam: Appears calm and comfortable  Respiratory system: Clear to auscultation. Respiratory effort normal. Cardiovascular system: Regular rate and rhythm, no murmurs Gastrointestinal system: Abdomen is nondistended,  soft and nontender. No organomegaly or masses felt. Normal bowel sounds heard. Central nervous system: Alert and oriented.  Cranial nerves II through XII intact, no  pronator drift,, right lower extremity weakness Extremities: No lower extremity edema Skin: Well-healed incisions of her head Psychiatry: Judgement and insight limited by intellectual disability    Data Reviewed: I have personally reviewed following labs and imaging studies  CBC: Recent Labs  Lab 06/07/18 1127 06/08/18 0428 06/08/18 2121 06/09/18 0602  WBC 8.2 7.2 8.2 7.5  NEUTROABS 6.1  --   --   --   HGB 15.7 14.8 15.5 14.6  HCT 45.3 42.7 46.3 43.9  MCV 92.9 93.5 94.1 94.6  PLT 240 213 243 409   Basic Metabolic Panel: Recent Labs  Lab 06/07/18 1102 06/08/18 0428 06/08/18 2121 06/09/18 0602  NA 138 141  --  141  K 3.5 3.3*  --  3.6  CL 107 108  --  111  CO2 22 26  --  22  GLUCOSE 122* 105*  --  100*  BUN 13 13  --  11  CREATININE 1.41* 1.30* 1.43* 1.40*  CALCIUM 8.7* 8.6*  --  9.0  MG  --   --   --  2.3  PHOS  --   --   --  3.6   GFR: Estimated Creatinine Clearance: 73.8 mL/min (A) (by C-G formula based on SCr of 1.4 mg/dL (H)). Liver Function Tests: Recent Labs  Lab 06/07/18 1102  AST 21  ALT 17  ALKPHOS 95  BILITOT 0.6  PROT 7.0  ALBUMIN 4.2   No results for input(s): LIPASE, AMYLASE in the last 168 hours. No results for input(s): AMMONIA in the last 168 hours. Coagulation Profile: Recent Labs  Lab 06/07/18 1102  INR 1.01   Cardiac Enzymes: Recent Labs  Lab 06/07/18 1102  TROPONINI <0.03   BNP (last 3 results) No results for input(s): PROBNP in the last 8760 hours. HbA1C: Recent Labs    06/08/18 0428  HGBA1C 5.4   CBG: No results for input(s): GLUCAP in the last 168 hours. Lipid Profile: Recent Labs    06/08/18 0428  CHOL 151  HDL 27*  LDLCALC 69  TRIG 277*  CHOLHDL 5.6   Thyroid Function Tests: No results for input(s): TSH, T4TOTAL, FREET4, T3FREE, THYROIDAB in the last 72 hours. Anemia Panel: No results for input(s): VITAMINB12, FOLATE, FERRITIN, TIBC, IRON, RETICCTPCT in the last 72 hours. Sepsis Labs: No results for  input(s): PROCALCITON, LATICACIDVEN in the last 168 hours.  No results found for this or any previous visit (from the past 240 hour(s)).       Radiology Studies: Dg Skull 1-3 Views  Result Date: 06/07/2018 CLINICAL DATA:  47 year old with indwelling ventriculovenous shunt who presents with RIGHT facial and body numbness that began approximately 5 days ago associated with mild weakness and diplopia. Evaluate the continuity of the shunt catheter. EXAM: SKULL - 1-3 VIEW CHEST  1 VIEW ABDOMEN - 1 VIEW COMPARISON:  CT head earlier same day and previously. Chest x-ray 04/03/2018 and earlier. Abdominal x-rays 11/14/2012. FINDINGS: Ventriculovenous shunt catheter entering from a RIGHT parietal approach with its tip across the midline as noted on the earlier CT head. Shunt catheter tubing appears continuous in the head and neck. No focal osseous abnormalities involving the skull. Paranasal sinuses well aerated. Ventriculovenous shunt catheter with its tip overlying the LOWER SVC, unchanged. The shunt catheter appears intact. Cardiomediastinal silhouette unremarkable, unchanged. Lungs clear. Bronchovascular markings normal. Pulmonary vascularity normal.  No visible pleural effusions. No pneumothorax. Bowel gas pattern unremarkable without evidence of obstruction or significant ileus. Moderate stool burden in the colon. Phlebolith low in the RIGHT side of the pelvis. No visible opaque urinary tract calculi. IMPRESSION: 1. Ventriculovenous shunt catheter tubing appears intact with its tip in the LOWER SVC, unchanged. 2.  No acute cardiopulmonary disease. 3. No acute abdominal abnormality. . Electronically Signed   By: Evangeline Dakin M.D.   On: 06/07/2018 13:20   Dg Chest 1 View  Result Date: 06/07/2018 CLINICAL DATA:  47 year old with indwelling ventriculovenous shunt who presents with RIGHT facial and body numbness that began approximately 5 days ago associated with mild weakness and diplopia. Evaluate the  continuity of the shunt catheter. EXAM: SKULL - 1-3 VIEW CHEST  1 VIEW ABDOMEN - 1 VIEW COMPARISON:  CT head earlier same day and previously. Chest x-ray 04/03/2018 and earlier. Abdominal x-rays 11/14/2012. FINDINGS: Ventriculovenous shunt catheter entering from a RIGHT parietal approach with its tip across the midline as noted on the earlier CT head. Shunt catheter tubing appears continuous in the head and neck. No focal osseous abnormalities involving the skull. Paranasal sinuses well aerated. Ventriculovenous shunt catheter with its tip overlying the LOWER SVC, unchanged. The shunt catheter appears intact. Cardiomediastinal silhouette unremarkable, unchanged. Lungs clear. Bronchovascular markings normal. Pulmonary vascularity normal. No visible pleural effusions. No pneumothorax. Bowel gas pattern unremarkable without evidence of obstruction or significant ileus. Moderate stool burden in the colon. Phlebolith low in the RIGHT side of the pelvis. No visible opaque urinary tract calculi. IMPRESSION: 1. Ventriculovenous shunt catheter tubing appears intact with its tip in the LOWER SVC, unchanged. 2.  No acute cardiopulmonary disease. 3. No acute abdominal abnormality. . Electronically Signed   By: Evangeline Dakin M.D.   On: 06/07/2018 13:20   Dg Abd 1 View  Result Date: 06/07/2018 CLINICAL DATA:  47 year old with indwelling ventriculovenous shunt who presents with RIGHT facial and body numbness that began approximately 5 days ago associated with mild weakness and diplopia. Evaluate the continuity of the shunt catheter. EXAM: SKULL - 1-3 VIEW CHEST  1 VIEW ABDOMEN - 1 VIEW COMPARISON:  CT head earlier same day and previously. Chest x-ray 04/03/2018 and earlier. Abdominal x-rays 11/14/2012. FINDINGS: Ventriculovenous shunt catheter entering from a RIGHT parietal approach with its tip across the midline as noted on the earlier CT head. Shunt catheter tubing appears continuous in the head and neck. No focal osseous  abnormalities involving the skull. Paranasal sinuses well aerated. Ventriculovenous shunt catheter with its tip overlying the LOWER SVC, unchanged. The shunt catheter appears intact. Cardiomediastinal silhouette unremarkable, unchanged. Lungs clear. Bronchovascular markings normal. Pulmonary vascularity normal. No visible pleural effusions. No pneumothorax. Bowel gas pattern unremarkable without evidence of obstruction or significant ileus. Moderate stool burden in the colon. Phlebolith low in the RIGHT side of the pelvis. No visible opaque urinary tract calculi. IMPRESSION: 1. Ventriculovenous shunt catheter tubing appears intact with its tip in the LOWER SVC, unchanged. 2.  No acute cardiopulmonary disease. 3. No acute abdominal abnormality. . Electronically Signed   By: Evangeline Dakin M.D.   On: 06/07/2018 13:20   Ct Head Wo Contrast  Result Date: 06/07/2018 CLINICAL DATA:  Right-sided body numbness for 5 days. Double vision. History of brain tumor and VP shunt. EXAM: CT HEAD WITHOUT CONTRAST TECHNIQUE: Contiguous axial images were obtained from the base of the skull through the vertex without intravenous contrast. COMPARISON:  10/03/2017 FINDINGS: Brain: Coarse calcifications in the cerebellum and to a  lesser extent occipital lobes and pons are unchanged and likely related to remote radiation therapy. A right parietal approach ventriculostomy catheter terminates at the level of the left caudate nucleus, unchanged. The ventricles are unchanged in size and configuration without dilatation. Linear calcification in the right frontal lobe is unchanged and may be related to a prior ventriculostomy catheter. Chronic lacunar infarcts are again seen in the right thalamus and basal ganglia. A lacunar infarct in the posterior inferior left thalamus is new. No acute large territory infarct, intracranial hemorrhage, or midline shift is identified. Low-density subdural fluid collections are again seen bilaterally  measuring up to 3 mm in thickness on the right and 4 mm on the left with the left-sided collection being smaller than on the prior study. Vascular: No hyperdense vessel. Skull: Suboccipital craniectomy. Sinuses/Orbits: Visualized paranasal sinuses and mastoid air cells are clear. Visualized orbits are unremarkable. Other: None. IMPRESSION: 1. New left thalamic lacunar infarct. 2. No evidence of acute large territory infarct or intracranial hemorrhage. 3. Unchanged appearance of the ventricles and VP shunt catheter. 4. Small chronic bilateral subdural fluid collections, slightly decreased in size on the left. Electronically Signed   By: Logan Bores M.D.   On: 06/07/2018 12:20   US Carotid Bilateral  Result Date: 06/08/2018 CLINICAL DATA:  Cerebral infarction EXAM: BILATERAL CAROTID DUPLEX ULTRASOUND TECHNIQUE: Pearline Cables scale imaging, color Doppler and duplex ultrasound were performed of bilateral carotid and vertebral arteries in the neck. COMPARISON:  None. FINDINGS: Criteria: Quantification of carotid stenosis is based on velocity parameters that correlate the residual internal carotid diameter with NASCET-based stenosis levels, using the diameter of the distal internal carotid lumen as the denominator for stenosis measurement. The following velocity measurements were obtained: RIGHT ICA: 52 cm/sec CCA: 374 cm/sec SYSTOLIC ICA/CCA RATIO:  0.5 ECA: 105 cm/sec LEFT ICA: 40 cm/sec CCA: 827 cm/sec SYSTOLIC ICA/CCA RATIO:  0.4 ECA: 93 cm/sec RIGHT CAROTID ARTERY: Little if any plaque in the bulb. Low resistance internal carotid Doppler pattern is preserved. RIGHT VERTEBRAL ARTERY:  Antegrade. LEFT CAROTID ARTERY: Little if any plaque in the bulb. Low resistance internal carotid Doppler pattern is preserved. LEFT VERTEBRAL ARTERY:  Antegrade. IMPRESSION: Less than 50% stenosis in the right and left internal carotid arteries. Electronically Signed   By: Marybelle Killings M.D.   On: 06/08/2018 11:48        Scheduled  Meds: . ALPRAZolam  0.5 mg Oral BID  . ARIPiprazole  5 mg Oral QHS  . aspirin  325 mg Oral Daily  . Dextromethorphan-quiNIDine  1 capsule Oral BID  . enoxaparin (LOVENOX) injection  40 mg Subcutaneous Q24H  . escitalopram  20 mg Oral Daily  . fluticasone furoate-vilanterol  1 puff Inhalation Daily  . lamoTRIgine  100 mg Oral BID  . levothyroxine  50 mcg Oral QAC breakfast  . omega-3 acid ethyl esters  1 g Oral BID  . pantoprazole  40 mg Oral Daily  . polycarbophil  1,250 mg Oral QHS  . polyethylene glycol  17 g Oral BID  . rosuvastatin  20 mg Oral Daily  . topiramate  100 mg Oral QPM  . topiramate  50 mg Oral q morning - 10a   Continuous Infusions:   LOS: 1 day    Time spent: [place], MD [facility]  If 7PM-7AM, please contact night-coverage [email or web address] Password [email or web address] 06/09/2018, 10:27 AM

## 2018-06-09 NOTE — Progress Notes (Signed)
Called to pt.'s room for onset of "severe HA upon waking up." Pt. Says has not been having these headaches. NIH completed, only change is right leg drift. Rates 8 out of 10. Dr. Herbert Moors and Burnetta Sabin, NP notified. New orders received. Will monitor.

## 2018-06-09 NOTE — Evaluation (Signed)
Occupational Therapy Evaluation Patient Details Name: [name] Love MRN: [id number] DOB: 1971/05/27 Today's Date: 06/09/2018    History of Present Illness THis 47 y.o. male admitted with 4 day h/o numbness to the Rt face, and Rt sided weakness, as well as intermittent diplopia.  He was found to have an acute lacunar thalamic infarct.   PMH includes:  Seizure, recurrent falls, posterior fossa ependymoma s/p multiple resections, programmable VP shunt.    Clinical Impression   Pt admitted with above. He demonstrates the below listed deficits and will benefit from continued OT to maximize safety and independence with BADLs.  Pt presents to OT with impaired balance, as well as vertical diplopia with near vision and nystagmus.  He currently requires min guard assist with ADLs.   Partial occlusion to glasses done which successfully reduced diplopia while allowing eyes to work binocularly, and to allow peripheral input for balance.   Also provided him with a patch and instructed him in rationale and use of both.  He lives with aunt, who can assist as needed.  He was mod I with ADLs and with driving - instructed him that he should not drive until diplopia improves and he has been cleared by OPOT and MD - he verbally agreed.  Recommend OPOT.       Follow Up Recommendations  Outpatient OT    Equipment Recommendations  Tub/shower bench    Recommendations for Other Services       Precautions / Restrictions Precautions Precautions: Fall Precaution Comments: Pt reports h/o frequent falls       Mobility Bed Mobility Overal bed mobility: Modified Independent                Transfers Overall transfer level: Needs assistance Equipment used: Rolling walker (2 wheeled) Transfers: Sit to/from [facility] Sit to Stand: Min guard Stand pivot transfers: Min guard       General transfer comment: assist to steady     Balance Overall balance assessment: Needs  assistance Sitting-balance support: Feet supported Sitting balance-Leahy Scale: Good     Standing balance support: No upper extremity supported Standing balance-Leahy Scale: Fair Standing balance comment: able to maintain static standing with wide BOS and min guard assist                            ADL either performed or assessed with clinical judgement   ADL Overall ADL's : Needs assistance/impaired Eating/Feeding: Independent   Grooming: Wash/dry hands;Wash/dry face;Oral care;Brushing hair;Min guard;Standing   Upper Body Bathing: Set up;Sitting   Lower Body Bathing: Min guard;Sit to/from stand   Upper Body Dressing : Set up;Sitting   Lower Body Dressing: Min guard;Sit to/from stand   Toilet Transfer: Min guard;Ambulation;Comfort height toilet;Grab bars;RW   Toileting- Water quality scientist and Hygiene: Min guard;Sit to/from stand       Functional mobility during ADLs: Min guard;Rolling walker       Vision Baseline Vision/History: Wears glasses Wears Glasses: Reading only Patient Visual Report: Diplopia Vision Assessment?: Yes Eye Alignment: Impaired (comment) Ocular Range of Motion: Within Functional Limits Tracking/Visual Pursuits: Decreased smoothness of vertical tracking;Decreased smoothness of horizontal tracking Convergence: Impaired (comment) Diplopia Assessment: Disappears with one eye closed;Objects split on top of one another;Present in near gaze;Present in primary gaze Additional Comments: Pt reports vertical diplopia.   Gaze is dysconjugate.  He reports no diplopia with distant vision, unless there is significant stimuli that he has to look at.  Nystagmus  and oscillopsia noted with Lt gaze      Perception Perception Perception Tested?: Yes   Praxis Praxis Praxis tested?: Within functional limits    Pertinent Vitals/Pain Pain Assessment: No/denies pain     Hand Dominance Left(was originally Rt handed )   Extremity/Trunk Assessment  Upper Extremity Assessment Upper Extremity Assessment: RUE deficits/detail RUE Deficits / Details: Pt with long standing sensory impairment Rt UE  RUE Sensation: decreased light touch RUE Coordination: decreased fine motor   Lower Extremity Assessment Lower Extremity Assessment: Defer to PT evaluation   Cervical / Trunk Assessment Cervical / Trunk Assessment: Normal   Communication Communication Communication: HOH(wears bil. hearing aids)   Cognition Arousal/Alertness: Awake/alert Behavior During Therapy: WFL for tasks assessed/performed Overall Cognitive Status: No family/caregiver present to determine baseline cognitive functioning                                 General Comments: Pt is [facility] for basic info and ADLs    General Comments  discussed with pt  that he should not drive, he agrees.      Exercises Exercises: Other exercises Other Exercises Other Exercises: partial occlusion to Rt lens of glasses (he is Lt eye dominant) which reduced diplopia.  reading glasses taped as well as clear lenses taped.  He also was provided with a patch and was instructed on the rationale for each and when to wear.    Shoulder Instructions      Home Living Family/patient expects to be discharged to:: Private residence Living Arrangements: Other relatives(aunt ) Available Help at Discharge: Family;Available 24 hours/day Type of Home: House Home Access: Stairs to enter [employer] of Steps: 1+1   Home Layout: One level     Bathroom Shower/Tub: Tub/shower unit;Curtain   Biochemist, clinical: Standard     Home Equipment: Cane - single point;Shower seat   Additional Comments: lives with his aunt       Prior Functioning/Environment Level of Independence: Independent with assistive device(s)        Comments: Pt ambulates with SPC.  He reports multiple falls.  He reports he drives, but knows he shouldn't because of diplopia         OT Problem List: Impaired  balance (sitting and/or standing);Impaired vision/perception;Decreased cognition;Decreased safety awareness;Decreased knowledge of use of DME or AE      OT Treatment/Interventions: Self-care/ADL training;DME and/or AE instruction;Therapeutic activities;Visual/perceptual remediation/compensation;Patient/family education;Balance training    OT Goals(Current goals can be found in the care plan section) Acute Rehab OT Goals Patient Stated Goal: to be able to drive  OT Goal Formulation: With patient Time For Goal Achievement: 06/16/18 Potential to Achieve Goals: Good  OT Frequency: Min 2X/week   Barriers to D/C:            Co-evaluation              AM-PAC PT "6 Clicks" Daily Activity     Outcome Measure Help from another person eating meals?: None Help from another person taking care of personal grooming?: A Little Help from another person toileting, which includes using toliet, bedpan, or urinal?: A Little Help from another person bathing (including washing, rinsing, drying)?: A Little Help from another person to put on and taking off regular upper body clothing?: A Little Help from another person to put on and taking off regular lower body clothing?: A Little 6 Click Score: 19   End of Session Equipment Utilized  During Treatment: Gait belt;Rolling walker Nurse Communication: Mobility status  Activity Tolerance: Patient tolerated treatment well Patient left: in chair;with call bell/phone within reach;with chair alarm set  OT Visit Diagnosis: Unsteadiness on feet (R26.81);Low vision, both eyes (H54.2)                Time: 6237-6283 OT Time Calculation (min): 40 min Charges:  OT Evaluation $OT Eval Moderate Complexity: 1 Mod OT Treatments $Self Care/Home Management : 8-22 mins $Therapeutic Activity: 8-22 mins  Lucille Passy, OTR/L Acute Rehabilitation Services Pager [phone] Office [phone]   Lucille Passy M 06/09/2018, 2:05 PM

## 2018-06-09 NOTE — Progress Notes (Signed)
PT Cancellation Note  Patient Details Name: [name] Love MRN: [id number] DOB: [birth year]   Cancelled Treatment:    Reason Eval/Treat Not Completed: Patient not medically ready Per RN, hold PT today as pt with sudden severe HA, placed on flat bedrest. Will follow up.   Marguarite Arbour A Colleena Kurtenbach 06/09/2018, 8:49 AM Wray Kearns, PT, DPT Acute Rehabilitation Services Pager [phone] Office [phone]

## 2018-06-09 NOTE — Evaluation (Signed)
Physical Therapy Evaluation Patient Details Name: [name] Love MRN: [id number] DOB: 14-Jan-1971 Today's Date: 06/09/2018   History of Present Illness  This 47 y.o. male admitted with 4 day h/o numbness to the Rt face, and Rt sided weakness, as well as intermittent diplopia.  He was found to have an acute lacunar thalamic infarct.   PMH includes:  Seizure, recurrent falls, posterior fossa ependymoma s/p multiple resections, programmable VP shunt.   Clinical Impression  Patient presents with right sided weakness/sensation deficits, visual deficits and impaired mobility s/p above. Pt Mod I PTA using SPC for ambulation. Reports multiple falls. Tolerated gait training today with min guard assist with 1 instance of min A due to LOB and running into machine in hall. Wants a rollator- will need to trial this to determine safety. Pt prefers patch to tape on glasses to correct diplopia. Will follow acutely to maximize independence and mobility prior to return home.     Follow Up Recommendations Outpatient PT;Supervision - Intermittent    Equipment Recommendations  Other (comment)(rollator)    Recommendations for Other Services       Precautions / Restrictions Precautions Precautions: Fall Precaution Comments: Pt reports h/o frequent falls  Restrictions Weight Bearing Restrictions: No      Mobility  Bed Mobility Overal bed mobility: Modified Independent             General bed mobility comments: Sitting in chair upon PT arrival.   Transfers Overall transfer level: Needs assistance Equipment used: Rolling walker (2 wheeled) Transfers: Sit to/from Stand Sit to Stand: Min guard Stand pivot transfers: Min guard       General transfer comment: assist to steady   Ambulation/Gait Ambulation/Gait assistance: Min assist;Min guard Gait Distance (Feet): 200 Feet Assistive device: Rolling walker (2 wheeled) Gait Pattern/deviations: Step-through pattern;Decreased stride length;Staggering  left Gait velocity: decreased   General Gait Details: Slow, slightly unsteady gait with 1 instance of running into machine in hallway causing LOB towards left when trying to correct- Min A needed.   Stairs            Wheelchair Mobility    Modified Rankin (Stroke Patients Only) Modified Rankin (Stroke Patients Only) Pre-Morbid Rankin Score: Moderate disability Modified Rankin: Moderately severe disability     Balance Overall balance assessment: Needs assistance Sitting-balance support: Feet supported;No upper extremity supported Sitting balance-Leahy Scale: Good     Standing balance support: During functional activity Standing balance-Leahy Scale: Fair Standing balance comment: able to maintain static standing with wide BOS and min guard assist                              Pertinent Vitals/Pain Pain Assessment: No/denies pain    Home Living Family/patient expects to be discharged to:: Private residence Living Arrangements: Other relatives Available Help at Discharge: Family;Available 24 hours/day Type of Home: House Home Access: Stairs to enter   [employer] of Steps: 1+1 Home Layout: One level Home Equipment: Cane - single point;Shower seat Additional Comments: lives with his aunt     Prior Function Level of Independence: Independent with assistive device(s)         Comments: Pt ambulates with SPC.  He reports multiple falls.  He reports he drives, but knows he shouldn't because of diplopia      Hand Dominance   Dominant Hand: Left    Extremity/Trunk Assessment   Upper Extremity Assessment Upper Extremity Assessment: Defer to OT evaluation RUE Deficits /  Details: Pt with long standing sensory impairment Rt UE  RUE Sensation: decreased light touch RUE Coordination: decreased fine motor    Lower Extremity Assessment Lower Extremity Assessment: RLE deficits/detail RLE Sensation: decreased light touch    Cervical / Trunk  Assessment Cervical / Trunk Assessment: Normal  Communication   Communication: HOH  Cognition Arousal/Alertness: Awake/alert Behavior During Therapy: WFL for tasks assessed/performed Overall Cognitive Status: No family/caregiver present to determine baseline cognitive functioning                                 General Comments: Pt is [facility] for basic info and ADLs       General Comments General comments (skin integrity, edema, etc.): Pt does not like tape on his glasses and prefers patch.    Exercises Other Exercises Other Exercises: partial occlusion to Rt lens of glasses (he is Lt eye dominant) which reduced diplopia.  reading glasses taped as well as clear lenses taped.  He also was provided with a patch and was instructed on the rationale for each and when to wear.    Assessment/Plan    PT Assessment Patient needs continued PT services  PT Problem List Decreased strength;Decreased mobility;Decreased balance;Impaired sensation       PT Treatment Interventions DME instruction;Functional mobility training;Gait training;Therapeutic activities;Stair training;Therapeutic exercise;Neuromuscular re-education;Balance training;Patient/family education    PT Goals (Current goals can be found in the Care Plan section)  Acute Rehab PT Goals Patient Stated Goal: to be able to drive  PT Goal Formulation: With patient Time For Goal Achievement: 06/23/18 Potential to Achieve Goals: Good    Frequency Min 3X/week   Barriers to discharge        Co-evaluation               AM-PAC PT "6 Clicks" Daily Activity  Outcome Measure Difficulty turning over in bed (including adjusting bedclothes, sheets and blankets)?: None Difficulty moving from lying on back to sitting on the side of the bed? : None Difficulty sitting down on and standing up from a chair with arms (e.g., wheelchair, bedside commode, etc,.)?: A Little Help needed moving to and from a bed to chair (including a  wheelchair)?: A Little Help needed walking in hospital room?: A Little Help needed climbing 3-5 steps with a railing? : A Little 6 Click Score: 20    End of Session Equipment Utilized During Treatment: Gait belt;Other (comment)(eye patch) Activity Tolerance: Patient tolerated treatment well Patient left: in chair;with call bell/phone within reach;with chair alarm set Nurse Communication: Mobility status PT Visit Diagnosis: Unsteadiness on feet (R26.81);Muscle weakness (generalized) (M62.81);Hemiplegia and hemiparesis Hemiplegia - Right/Left: Right Hemiplegia - dominant/non-dominant: Dominant Hemiplegia - caused by: Cerebral infarction    Time: 1444-1510 PT Time Calculation (min) (ACUTE ONLY): 26 min   Charges:   PT Evaluation $PT Eval Low Complexity: 1 Low PT Treatments $Gait Training: 8-22 mins        Wray Kearns, PT, DPT Acute Rehabilitation Services Pager [phone] Office [place] 06/09/2018, 3:33 PM

## 2018-06-09 NOTE — Progress Notes (Signed)
OT Cancellation Note  Patient Details Name: [name] Love MRN: [id number] DOB: 24-Feb-1971   Cancelled Treatment:    Reason Eval/Treat Not Completed: Active bedrest order.  Will initiate eval once activity increased.  Lucille Passy, OTR/L Acute Rehabilitation Services Pager [phone] Office [phone]   Lucille Passy M 06/09/2018, 9:57 AM

## 2018-06-09 NOTE — Progress Notes (Addendum)
STROKE TEAM PROGRESS NOTE  HPI: ( DR Cheral Marker ) [name] Love is an 47 y.o. male who presented to [facility] with a 4 day history of numbness involving the right side of his face and body in conjunction with RUE and RLE weakness. He also endorses having double vision. CT head at [facility] revealed a new left thalamic lacunar infarction. 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 Therefore, the patient was transferred to [facility] for MRI.  He has a history of seizures. He takes Lamictal 100 mg BID and Topamax 50 qam and 100 qhs at home.   The patient was started on ASA and rosuvastatin at [facility]. Of note, his friend states that patient has a poor recall of what meds he takes and needs assistance to take his meds at home.   Admission history from [facility] (06/07/18) reviewed: "DavidBeanis a46 y.o.malewith a known history of previous history of ependymoma of the brain status post surgery, hypothyroidism, history of seizures, anxiety, depression who presents to the hospital due to right sided numbness and double vision. Patient says he fell about 5 days ago and developed some numbness and weakness to his right side and thought it was related to his fall. He has also been complaining of double vision but that has been going on for months and he has seen an ophthalmologist who has referred him to another specialist in [place]. His numbness although began 3 days ago and therefore he was a bit concerned and came to the ER for further evaluation. Patient denies any headache, fever, chills or any other associated symptoms. He does have some nausea and some intermittent vomiting which has now resolved. Patient underwent a CT of his head which was positive for acute thalamic CVA. Hospitalist services were contacted for admission."  INTERVAL HISTORY No family is at the bedside.  He reports R  sided numbness face, arm and leg. Occurred 5 days ago. CT shows small left thalamic lacunar infarct likely subacute.  Woke with severe HA this am. Gave iv NS bolus, put on bedrest. Reported LE weakness. Now with no weakness, denies HA. Ok to get OOB. Discussed with RN.   Vitals:   06/09/18 0006 06/09/18 0422 06/09/18 0801  BP: 104/64 98/62 121/79  Pulse: 82 74 86  Resp: 18 18 16   Temp: 98.3 F (36.8 C) 97.7 F (36.5 C) 98.2 F (36.8 C)  TempSrc: Oral Oral Oral  SpO2: 96% 96% 98%    CBC:  Recent Labs  Lab 06/07/18 1127  06/08/18 2121 06/09/18 0602  WBC 8.2   < > 8.2 7.5  NEUTROABS 6.1  --   --   --   HGB 15.7   < > 15.5 14.6  HCT 45.3   < > 46.3 43.9  MCV 92.9   < > 94.1 94.6  PLT 240   < > 243 217   < > = values in this interval not displayed.    Basic Metabolic Panel:  Recent Labs  Lab 06/08/18 0428 06/08/18 2121 06/09/18 0602  NA 141  --  141  K 3.3*  --  3.6  CL 108  --  111  CO2 26  --  22  GLUCOSE 105*  --  100*  BUN 13  --  11  CREATININE 1.30* 1.43* 1.40*  CALCIUM 8.6*  --  9.0  MG  --   --  2.3  PHOS  --   --  3.6   Lipid Panel:     Component Value Date/Time   CHOL 151 06/08/2018 0428   TRIG 277 (H) 06/08/2018 0428   HDL 27 (L) 06/08/2018 0428   CHOLHDL 5.6 06/08/2018 0428   VLDL 55 (H) 06/08/2018 0428   LDLCALC 69 06/08/2018 0428   HgbA1c:  Lab Results  Component Value Date   HGBA1C 5.4 06/08/2018   Urine Drug Screen: No results found for: LABOPIA, COCAINSCRNUR, LABBENZ, AMPHETMU, THCU, LABBARB  Alcohol Level No results found for: ETH  IMAGING Dg Skull 1-3 Views  Result Date: 06/07/2018 CLINICAL DATA:  46 year old with indwelling ventriculovenous shunt who presents with RIGHT facial and body numbness that began approximately 5 days ago associated with mild weakness and diplopia. Evaluate the continuity of the shunt catheter. EXAM: SKULL - 1-3 VIEW CHEST  1 VIEW ABDOMEN - 1 VIEW COMPARISON:  CT head earlier same day and previously. Chest  x-ray 04/03/2018 and earlier. Abdominal x-rays 11/14/2012. FINDINGS: Ventriculovenous shunt catheter entering from a RIGHT parietal approach with its tip across the midline as noted on the earlier CT head. Shunt catheter tubing appears continuous in the head and neck. No focal osseous abnormalities involving the skull. Paranasal sinuses well aerated. Ventriculovenous shunt catheter with its tip overlying the LOWER SVC, unchanged. The shunt catheter appears intact. Cardiomediastinal silhouette unremarkable, unchanged. Lungs clear. Bronchovascular markings normal. Pulmonary vascularity normal. No visible pleural effusions. No pneumothorax. Bowel gas pattern unremarkable without evidence of obstruction or significant ileus. Moderate stool burden in the colon. Phlebolith low in the RIGHT side of the pelvis. No visible opaque urinary tract calculi. IMPRESSION: 1. Ventriculovenous shunt catheter tubing appears intact with its tip in the LOWER SVC, unchanged. 2.  No acute cardiopulmonary disease. 3. No acute abdominal abnormality. . Electronically Signed   By: [name] Love M.D.   On: 06/07/2018 13:20   Dg Chest 1 View  Result Date: 06/07/2018 CLINICAL DATA:  47 year old with indwelling ventriculovenous shunt who presents with RIGHT facial and body numbness that began approximately 5 days ago associated with mild weakness and diplopia. Evaluate the continuity of the shunt catheter. EXAM: SKULL - 1-3 VIEW CHEST  1 VIEW ABDOMEN - 1 VIEW COMPARISON:  CT head earlier same day and previously. Chest x-ray 04/03/2018 and earlier. Abdominal x-rays 11/14/2012. FINDINGS: Ventriculovenous shunt catheter entering from a RIGHT parietal approach with its tip across the midline as noted on the earlier CT head. Shunt catheter tubing appears continuous in the head and neck. No focal osseous abnormalities involving the skull. Paranasal sinuses well aerated. Ventriculovenous shunt catheter with its tip overlying the LOWER SVC,  unchanged. The shunt catheter appears intact. Cardiomediastinal silhouette unremarkable, unchanged. Lungs clear. Bronchovascular markings normal. Pulmonary vascularity normal. No visible pleural effusions. No pneumothorax. Bowel gas pattern unremarkable without evidence of obstruction or significant ileus. Moderate stool burden in the colon. Phlebolith low in the RIGHT side of the pelvis. No visible opaque urinary tract calculi. IMPRESSION: 1. Ventriculovenous shunt catheter tubing appears intact with its tip in the LOWER SVC, unchanged. 2.  No acute cardiopulmonary disease. 3. No acute abdominal abnormality. . Electronically Signed   By: [name] Love M.D.   On: 06/07/2018 13:20   Dg Abd 1 View  Result Date: 06/07/2018 CLINICAL DATA:  47 year old with indwelling ventriculovenous shunt who presents with RIGHT facial and body numbness that began approximately 5 days ago associated with mild weakness and diplopia. Evaluate the continuity of the shunt catheter. EXAM:  SKULL - 1-3 VIEW CHEST  1 VIEW ABDOMEN - 1 VIEW COMPARISON:  CT head earlier same day and previously. Chest x-ray 04/03/2018 and earlier. Abdominal x-rays 11/14/2012. FINDINGS: Ventriculovenous shunt catheter entering from a RIGHT parietal approach with its tip across the midline as noted on the earlier CT head. Shunt catheter tubing appears continuous in the head and neck. No focal osseous abnormalities involving the skull. Paranasal sinuses well aerated. Ventriculovenous shunt catheter with its tip overlying the LOWER SVC, unchanged. The shunt catheter appears intact. Cardiomediastinal silhouette unremarkable, unchanged. Lungs clear. Bronchovascular markings normal. Pulmonary vascularity normal. No visible pleural effusions. No pneumothorax. Bowel gas pattern unremarkable without evidence of obstruction or significant ileus. Moderate stool burden in the colon. Phlebolith low in the RIGHT side of the pelvis. No visible opaque urinary tract calculi.  IMPRESSION: 1. Ventriculovenous shunt catheter tubing appears intact with its tip in the LOWER SVC, unchanged. 2.  No acute cardiopulmonary disease. 3. No acute abdominal abnormality. . Electronically Signed   By: [name] Love M.D.   On: 06/07/2018 13:20   Ct Head Wo Contrast  Result Date: 06/07/2018 CLINICAL DATA:  Right-sided body numbness for 5 days. Double vision. History of brain tumor and VP shunt. EXAM: CT HEAD WITHOUT CONTRAST TECHNIQUE: Contiguous axial images were obtained from the base of the skull through the vertex without intravenous contrast. COMPARISON:  10/03/2017 FINDINGS: Brain: Coarse calcifications in the cerebellum and to a lesser extent occipital lobes and pons are unchanged and likely related to remote radiation therapy. A right parietal approach ventriculostomy catheter terminates at the level of the left caudate nucleus, unchanged. The ventricles are unchanged in size and configuration without dilatation. Linear calcification in the right frontal lobe is unchanged and may be related to a prior ventriculostomy catheter. Chronic lacunar infarcts are again seen in the right thalamus and basal ganglia. A lacunar infarct in the posterior inferior left thalamus is new. No acute large territory infarct, intracranial hemorrhage, or midline shift is identified. Low-density subdural fluid collections are again seen bilaterally measuring up to 3 mm in thickness on the right and 4 mm on the left with the left-sided collection being smaller than on the prior study. Vascular: No hyperdense vessel. Skull: Suboccipital craniectomy. Sinuses/Orbits: Visualized paranasal sinuses and mastoid air cells are clear. Visualized orbits are unremarkable. Other: None. IMPRESSION: 1. New left thalamic lacunar infarct. 2. No evidence of acute large territory infarct or intracranial hemorrhage. 3. Unchanged appearance of the ventricles and VP shunt catheter. 4. Small chronic bilateral subdural fluid collections,  slightly decreased in size on the left. Electronically Signed   By: Logan Bores M.D.   On: 06/07/2018 12:20   US Carotid Bilateral  Result Date: 06/08/2018 CLINICAL DATA:  Cerebral infarction EXAM: BILATERAL CAROTID DUPLEX ULTRASOUND TECHNIQUE: Pearline Cables scale imaging, color Doppler and duplex ultrasound were performed of bilateral carotid and vertebral arteries in the neck. COMPARISON:  None. FINDINGS: Criteria: Quantification of carotid stenosis is based on velocity parameters that correlate the residual internal carotid diameter with NASCET-based stenosis levels, using the diameter of the distal internal carotid lumen as the denominator for stenosis measurement. The following velocity measurements were obtained: RIGHT ICA: 52 cm/sec CCA: 585 cm/sec SYSTOLIC ICA/CCA RATIO:  0.5 ECA: 105 cm/sec LEFT ICA: 40 cm/sec CCA: 277 cm/sec SYSTOLIC ICA/CCA RATIO:  0.4 ECA: 93 cm/sec RIGHT CAROTID ARTERY: Little if any plaque in the bulb. Low resistance internal carotid Doppler pattern is preserved. RIGHT VERTEBRAL ARTERY:  Antegrade. LEFT CAROTID ARTERY: Little if any  plaque in the bulb. Low resistance internal carotid Doppler pattern is preserved. LEFT VERTEBRAL ARTERY:  Antegrade. IMPRESSION: Less than 50% stenosis in the right and left internal carotid arteries. Electronically Signed   By: Marybelle Killings M.D.   On: 06/08/2018 11:48   2D Echocardiogram  - Left ventricle: The cavity size was normal. Systolic function was normal. The estimated ejection fraction was in the range of 55% to 60%. Wall motion was normal; there were no regional wall motion abnormalities. Impressions:  No cardiac source of emboli was indentified.  Carotid Doppler   Less than 50% stenosis in the right and left internal carotid arteries.  TCD pending    PHYSICAL EXAM Resident middle-age Caucasian male currently not in distress. He is hard of hearing despite his hearing aid. . Afebrile. Head is nontraumatic. Neck is supple without bruit.     Cardiac exam no murmur or gallop. Lungs are clear to auscultation. Distal pulses are well felt. Neurological Exam ;  Awake  Alert oriented x 3. Normal speech and language.eye movements full without nystagmus.fundi were not visualized. Vision acuity and fields appear normal. Hearing is normal. Palatal movements are normal. Face symmetric. Tongue midline. Normal strength, tone, reflexes and coordination. Diminished right hemibody touch and pinprickl sensation including forehead and splits midline. Splits vibration sensation over forehead as well  .[name] Love Gait deferred.   ASSESSMENT/PLAN [name] Love is a 47 y.o. male with history of posterior fossa ependymoma s/p OR with hunt placement in 1995 with multiple revisions, seizure, anxiety, abnormal gout and recurrent falls presenting to [facility] with R face, arm and leg numbness x 4 days and double vision for a few months.  Stroke: subacute  left thalamic lacunar infarct secondary to small vessel disease    CT head L thalamic lacune. Unchanged ventricles and BP shunt catheters. Small B subdural fluid collections  MRI  pending (pt of Dr. Christella Noa, Dr. Leonie Man spoke w. Dr. Trenton Gammon who will reprogram following MRI)  MRA  pending   Carotid Doppler  < 50% bilateral ICA  2D Echo  EF 55-60%. No source of embolus   TCD pending   LDL 69  HgbA1c 5.4  Lovenox 40 mg sq daily for VTE prophylaxis  aspirin 325 mg daily prior to admission, now on aspirin 325 mg daily. Given mild stroke, recommend aspirin 81 mg and plavix 75 mg daily x 3 weeks, then PLAVIX alone. Orders adjusted.   Therapy recommendations:  pending   Disposition:  pending   Seizures  On lamictal 100 bid and topamax 100 q hs  Hyperlipidemia  Home meds:  crestor 20, resumed in hospital  LDL 69, at goal < 70  Continue statin at discharge  Other Stroke Risk Factors    Other Active Problems  Diplopia - f/u with MD at [facility] as previously scheduled  History of posterior fossa  ependymoma s/p OR with hunt placement in 1995 with multiple revisions, resultant seizures  behaviour d/o, pain, pscych  Hypothyroidism  Asthma  Hospital day # [place], MSN, APRN, ANVP-BC, AGPCNP-BC Advanced Practice Stroke Nurse [place] for Schedule & Pager information 06/09/2018 1:56 PM  I have personally examined this patient, reviewed notes, independently viewed imaging studies, participated in medical decision making and plan of care.ROS completed by me personally and pertinent positives fully documented  I have made any additions or clarifications directly to the above note. Agree with note above.  He has presented with several days of right-sided paresthesias  secondary to subacute left thalamic infarct from small vessel disease. He has remote history of seizures which appear to be well controlled. Continue ongoing stroke workup.recommend dual antiplatelet therapy of aspirin and Plavix for 3 weeks followed by Plavix alone. Discuss with Dr. Herbert Moors. Greater than 50% time during this 35 minute visit was spent on counseling and coordination of care about lacunar infarct, paresthesias and answering questions  Antony Contras, [place] Pager: [phone] 06/09/2018 3:41 PM  To contact Stroke Continuity provider, please refer to [email or web address] After hours, contact General Neurology

## 2018-06-10 ENCOUNTER — Inpatient Hospital Stay (HOSPITAL_COMMUNITY): Payer: Medicare Other

## 2018-06-10 MED ORDER — FLUTICASONE FUROATE-VILANTEROL 100-25 MCG/INH IN AEPB
1.0000 | INHALATION_SPRAY | Freq: Every day | RESPIRATORY_TRACT | Status: DC
  Administered 2018-06-10 – 2018-06-11 (×2): 1 via RESPIRATORY_TRACT

## 2018-06-10 NOTE — Progress Notes (Addendum)
Physical Therapy Treatment Patient Details Name: [name] Love MRN: [id number] DOB: 1971-05-27 Today's Date: 06/10/2018    History of Present Illness This 47 y.o. male admitted with 4 day h/o numbness to the Rt face, and Rt sided weakness, as well as intermittent diplopia.  He was found to have an acute lacunar thalamic infarct.   PMH includes:  Seizure, recurrent falls, posterior fossa ependymoma s/p multiple resections, programmable VP shunt.     PT Comments    Patient seen for activity progression and assessment of mobility with use of 4 wheeled rollator. Patient with noted improvements in cadence and stability using Rollator. More fluid with gait. Agree with recommendation for 4 wheeled rollator.  Current POC remains appropriate.    Follow Up Recommendations  Home health PT     Equipment Recommendations  Other (comment)(rollator)    Recommendations for Other Services       Precautions / Restrictions Precautions Precautions: Fall Precaution Comments: Pt reports h/o frequent falls  Restrictions Weight Bearing Restrictions: No    Mobility  Bed Mobility Overal bed mobility: Modified Independent             General bed mobility comments: Sitting in chair upon PT arrival.   Transfers Overall transfer level: Needs assistance Equipment used: 4-wheeled walker Transfers: Sit to/from Stand Sit to Stand: Supervision         General transfer comment: Supervision for safety, one VCs for brakes  Ambulation/Gait Ambulation/Gait assistance: Supervision   Assistive device: 4-wheeled walker Gait Pattern/deviations: Step-through pattern;Decreased stride length;Staggering left Gait velocity: decreased Gait velocity interpretation: 1.31 - 2.62 ft/sec, indicative of limited community ambulator General Gait Details: improved cadence and stability with use of 4 wheeled Rollator   Stairs Stairs: Yes Stairs assistance: Supervision Stair Management: Two rails Number of Stairs:  5 General stair comments: Supervision for safety, some limited RLE clearance noted, no physical assist required   Wheelchair Mobility    Modified Rankin (Stroke Patients Only) Modified Rankin (Stroke Patients Only) Pre-Morbid Rankin Score: Moderate disability Modified Rankin: Moderately severe disability     Balance Overall balance assessment: Needs assistance Sitting-balance support: Feet supported;No upper extremity supported Sitting balance-Leahy Scale: Good     Standing balance support: During functional activity Standing balance-Leahy Scale: Fair                              Cognition Arousal/Alertness: Awake/alert Behavior During Therapy: WFL for tasks assessed/performed Overall Cognitive Status: No family/caregiver present to determine baseline cognitive functioning                                 General Comments: Pt is [facility] for basic info and ADLs       Exercises      General Comments        Pertinent Vitals/Pain Pain Assessment: No/denies pain    Home Living                      Prior Function            PT Goals (current goals can now be found in the care plan section) Acute Rehab PT Goals Patient Stated Goal: to be able to drive  PT Goal Formulation: With patient Time For Goal Achievement: 06/23/18 Potential to Achieve Goals: Good Progress towards PT goals: Progressing toward goals    Frequency    Min  3X/week      PT Plan Current plan remains appropriate    Co-evaluation              AM-PAC PT "6 Clicks" Daily Activity  Outcome Measure  Difficulty turning over in bed (including adjusting bedclothes, sheets and blankets)?: None Difficulty moving from lying on back to sitting on the side of the bed? : None Difficulty sitting down on and standing up from a chair with arms (e.g., wheelchair, bedside commode, etc,.)?: A Little Help needed moving to and from a bed to chair (including a  wheelchair)?: A Little Help needed walking in hospital room?: A Little Help needed climbing 3-5 steps with a railing? : A Little 6 Click Score: 20    End of Session Equipment Utilized During Treatment: Gait belt;Other (comment)(eye patch) Activity Tolerance: Patient tolerated treatment well Patient left: in chair;with call bell/phone within reach;with chair alarm set Nurse Communication: Mobility status PT Visit Diagnosis: Unsteadiness on feet (R26.81);Muscle weakness (generalized) (M62.81);Hemiplegia and hemiparesis Hemiplegia - Right/Left: Right Hemiplegia - dominant/non-dominant: Dominant Hemiplegia - caused by: Cerebral infarction     Time: 0932-0949 PT Time Calculation (min) (ACUTE ONLY): 17 min  Charges:  $Gait Training: 8-22 mins                     Alben Deeds, PT DPT  Board Certified Neurologic Specialist [place] 06/10/2018, 10:30 AM

## 2018-06-10 NOTE — Plan of Care (Signed)
  Problem: Education: Goal: Knowledge of General Education information will improve Description Including pain rating scale, medication(s)/side effects and non-pharmacologic comfort measures Outcome: Progressing   Problem: Health Behavior/Discharge Planning: Goal: Ability to manage health-related needs will improve Outcome: Progressing   Problem: Elimination: Goal: Will not experience complications related to bowel motility Outcome: Progressing   Problem: Pain Managment: Goal: General experience of comfort will improve Outcome: Progressing   Problem: Safety: Goal: Ability to remain free from injury will improve Outcome: Progressing   Problem: Education: Goal: Knowledge of disease or condition will improve Outcome: Progressing Goal: Knowledge of secondary prevention will improve Outcome: Progressing Goal: Knowledge of patient specific risk factors addressed and post discharge goals established will improve Outcome: Progressing

## 2018-06-10 NOTE — Progress Notes (Signed)
RN spoke to Dr. Annette Stable regarding reprogram of VP shunt and PA of Dr. Annette Stable will call MRI at [phone] to speak with MRI staffs and pt that MRI need to be complete today.   Ave Filter, RN

## 2018-06-10 NOTE — Progress Notes (Signed)
STROKE TEAM PROGRESS NOTE  INTERVAL HISTORY Pt Aunt is at the bedside.  He still complains of right-sided numbness and slight weakness.  However, he is able to walk in room and I cannot see any weakness or dragging around right side.  MRI and MRA pending.  Patient aunt asking for contrast MRI as he is due for brain tumor surveillance.  Vitals:   06/10/18 0419 06/10/18 0716 06/10/18 1232 06/10/18 1608  BP: 109/72 106/73 (!) 131/94 (!) 126/92  Pulse: 78 75 98 96  Resp: 18 20 20 16   Temp: 97.9 F (36.6 C) 97.8 F (36.6 C) 98.1 F (36.7 C) 98.2 F (36.8 C)  TempSrc: Oral Oral Oral Oral  SpO2: 96% 96% 96% 98%    CBC:  Recent Labs  Lab 06/07/18 1127  06/08/18 2121 06/09/18 0602  WBC 8.2   < > 8.2 7.5  NEUTROABS 6.1  --   --   --   HGB 15.7   < > 15.5 14.6  HCT 45.3   < > 46.3 43.9  MCV 92.9   < > 94.1 94.6  PLT 240   < > 243 217   < > = values in this interval not displayed.    Basic Metabolic Panel:  Recent Labs  Lab 06/08/18 0428 06/08/18 2121 06/09/18 0602  NA 141  --  141  K 3.3*  --  3.6  CL 108  --  111  CO2 26  --  22  GLUCOSE 105*  --  100*  BUN 13  --  11  CREATININE 1.30* 1.43* 1.40*  CALCIUM 8.6*  --  9.0  MG  --   --  2.3  PHOS  --   --  3.6   Lipid Panel:     Component Value Date/Time   CHOL 151 06/08/2018 0428   TRIG 277 (H) 06/08/2018 0428   HDL 27 (L) 06/08/2018 0428   CHOLHDL 5.6 06/08/2018 0428   VLDL 55 (H) 06/08/2018 0428   LDLCALC 69 06/08/2018 0428   HgbA1c:  Lab Results  Component Value Date   HGBA1C 5.4 06/08/2018   Urine Drug Screen: No results found for: LABOPIA, COCAINSCRNUR, LABBENZ, AMPHETMU, THCU, LABBARB  Alcohol Level No results found for: [facility]  IMAGING  Dg Skull 1-3 Views 06/07/2018 IMPRESSION:  1. Ventriculovenous shunt catheter tubing appears intact with its tip in the LOWER SVC, unchanged.  2.  No acute cardiopulmonary disease.  3. No acute abdominal abnormality.   Dg Chest 1 View 06/07/2018 IMPRESSION:  1.  Ventriculovenous shunt catheter tubing appears intact with its tip in the LOWER SVC, unchanged.  2.  No acute cardiopulmonary disease.  3. No acute abdominal abnormality.   Dg Abd 1 View 06/07/2018 IMPRESSION:  1. Ventriculovenous shunt catheter tubing appears intact with its tip in the LOWER SVC, unchanged.  2.  No acute cardiopulmonary disease.  3. No acute abdominal abnormality.   Ct Head Wo Contrast 06/07/2018 IMPRESSION:  1. New left thalamic lacunar infarct.  2. No evidence of acute large territory infarct or intracranial hemorrhage.  3. Unchanged appearance of the ventricles and VP shunt catheter.  4. Small chronic bilateral subdural fluid collections, slightly decreased in size on the left.   US Carotid Bilateral 06/08/2018 IMPRESSION:  Less than 50% stenosis in the right and left internal carotid arteries.   MRI / MRA Head - pending  2D Echocardiogram  - Left ventricle: The cavity size was normal. Systolic function was normal. The estimated ejection fraction  was in the range of 55% to 60%. Wall motion was normal; there were no regional wall motion abnormalities. Impressions:  No cardiac source of emboli was indentified.  Carotid Doppler   Less than 50% stenosis in the right and left internal carotid arteries.   PHYSICAL EXAM Resident middle-age Caucasian male currently not in distress. He is hard of hearing despite his hearing aid. Afebrile. Head is nontraumatic. Neck is supple without bruit.    Cardiac exam no murmur or gallop. Lungs are clear to auscultation. Distal pulses are well felt. Neurological Exam ;  Awake  Alert oriented x 3. Normal speech and language, however mild psychomotor slowing. eye movements full without nystagmus. fundi were not visualized. Vision acuity and fields appear normal. Hearing is normal. Palatal movements are normal. Face symmetric. Tongue midline. Normal strength, tone, reflexes and coordination. Diminished right hemibody touch and pinprickl  sensation including forehead and splits midline. Splits vibration sensation over forehead as well.  Coordination intact bilaterally, gait and stance intact while walking in the room.   ASSESSMENT/PLAN Mr. [name] Love is a 47 y.o. male with history of posterior fossa ependymoma s/p OR with hunt placement in 1995 with multiple revisions, seizure, anxiety, abnormal gout and recurrent falls presenting to [facility] with R face, arm and leg numbness x 4 days and double vision for a few months.  Stroke: subacute left thalamic lacunar infarct secondary to small vessel disease    CT head L thalamic lacune. Unchanged ventricles and BP shunt catheters. Small B subdural fluid collections  MRI  pending (pt of Dr. Christella Noa, Dr. Leonie Man spoke w. Dr. Trenton Gammon who will reprogram following MRI)  MRA  pending   Carotid Doppler  < 50% bilateral ICA  2D Echo  EF 55-60%. No source of embolus   LDL 69  HgbA1c 5.4  Lovenox 40 mg sq daily for VTE prophylaxis  aspirin 325 mg daily prior to admission, now on aspirin 81 mg and plavix 75 mg daily x 3 weeks, then ASA alone.    Therapy recommendations: HH PT  Disposition:  pending   History of seizure  On lamictal 100 bid and topamax 100 q hs  Seizure prevention  History of posterior fossa ependymoma  s/p OR with shunt placement in 1995 by Dr. Christella Noa with multiple revisions, resultant seizures   Due for repeat MRI with contrast for tumor surveillance  Dr. Trenton Gammon will reprogram the shunt after MRI  Diplopia  Chronic on eye patch  f/u with MD at [facility] as previously scheduled  Other Stroke Risk Factors    Other Active Problems  behaviour d/o, pain, pscych  Hypothyroidism  Asthma  Hearing loss  Hospital day # 2  Rosalin Hawking, MD PhD Stroke Neurology 06/10/2018 5:52 PM   To contact Stroke Continuity provider, please refer to [email or web address] After hours, contact General Neurology

## 2018-06-10 NOTE — Progress Notes (Signed)
MRI scan not yet performed.  I will return tomorrow to reprogram shunt if MRI scan is done by then.

## 2018-06-10 NOTE — Progress Notes (Signed)
Spoke with Trinda Pascal, RN with Dr Annette Stable.  They will be here around 0830 or 0900 on 9/8 to reprogram shunt. MRI will be done in the morning prior to this so that patient does not have to endure the Headache for a long time.  Transport appt has been set up for 0715 in the morning.

## 2018-06-10 NOTE — Progress Notes (Signed)
PROGRESS NOTE    [name]  JQZ:[id number] DOB: 09/06/1971 DOA: 06/08/2018 PCP: Isaias Sakai, DO    Brief Narrative:  47 year old with past medical history relevant for posterior fossa ependymoma status post resection and multiple surgeries, programmable VP shunt placement, gated by multiple revisions, intellectual disability, asthma, sensorineural deafness, seizure disorder, hypothyroidism, mood disorder who presented to [facility] with a 4-day history of numbness on the right face and right-sided weakness and found to have an acute left thalamic lacunar infarct.  Patient was transferred here as he has a programmable VP shunt that needs to be reprogrammed after MRI.   Assessment & Plan:   Active Problems:   Acute CVA (cerebrovascular accident) ([place])   Seizure ([place])   Recurrent falls   Diplopia   #) Acute left thalamic CVA: Patient is not quite as symptomatic as he was prior to treatment admission at [facility]. -MRI brain, MRA head neck pending - Neurosurgery consult at [phone], Dr. Christella Noa to reprogram after MRI - Continue aspirin 325 daily -Continue rosuvastatin 20 mg daily -Hemoglobin A1c is 5.4 - Lipid panel shows LDL of 69 -Neurology consult, appreciate recommendations -PT/OT/speech line which pathology -Echo on 06/08/2018 normal -Physical therapy recommends outpatient PT and 4 wheeled Rollator  #) Posterior fossa tumor status post surgery, gated by hydrocephalus requiring multiple VP shunts:  -Acetaminophen as needed -Neurosurgery consult per above  #) Seizure disorder: -Continue lamotrigine 100 mg twice daily - Continue topiramate 50 mill grams every morning and 100 mg nightly  #) Behavioral disorder/pain/psych: - Continue escitalopram 20 mg daily -Continue aripiprazole 5 mg nightly -Continue alprazolam 0.5 mg twice daily -Continue dexamethasone-quinidine twice daily  #) Hypothyroidism: -Continue levothyroxine 50 mcg  daily  #) Asthma: -Continue ICS/LABA -Continue PRN bronchodilators  Fluids: Tolerating p.o. Electrolyte: Monitor and supplement Nutrition: Heart healthy diet  Prophylaxis: Enoxaparin  Disposition: Pending MRI and neurology consultation  Full code  Consultants:   Neurology  Neurosurgery  Procedures:  06/08/2018 echo: - Left ventricle: The cavity size was normal. Systolic function was   normal. The estimated ejection fraction was in the range of 55%   to 60%. Wall motion was normal; there were no regional wall   motion abnormalities.  Impressions:   - No cardiac source of emboli was indentified.  Antimicrobials:   None   Subjective: Patient does not report any more headache today.  He does report that he has some abdominal pain as he had too much syrup with his pancakes.  He is still not received his MRI.  He denies any cough, congestion, rhinorrhea.  Objective: Vitals:   06/09/18 2305 06/09/18 2305 06/10/18 0419 06/10/18 0716  BP: 115/83 115/83 109/72 106/73  Pulse: 83 83 78 75  Resp: 20 20 18 20   Temp: 98.3 F (36.8 C) 98.3 F (36.8 C) 97.9 F (36.6 C) 97.8 F (36.6 C)  TempSrc: Oral Oral Oral Oral  SpO2: 96% 96% 96% 96%    Intake/Output Summary (Last 24 hours) at 06/10/2018 0955 Last data filed at 06/10/2018 0845 Gross per 24 hour  Intake 360 ml  Output 2400 ml  Net -2040 ml   There were no vitals filed for this visit.  Examination:  General exam: Appears calm and comfortable  Respiratory system: Clear to auscultation. Respiratory effort normal. Cardiovascular system: Regular rate and rhythm, no murmurs Gastrointestinal system: Abdomen is nondistended, soft and nontender. No organomegaly or masses felt. Normal bowel sounds heard. Central nervous system: Alert and oriented.  Cranial nerves II  through XII intact, no pronator drift, no right lower extremity weakness, some diminished sensation over right side Extremities: No lower extremity  edema Skin: Well-healed incisions of her head Psychiatry: Judgement and insight limited by intellectual disability    Data Reviewed: I have personally reviewed following labs and imaging studies  CBC: Recent Labs  Lab 06/07/18 1127 06/08/18 0428 06/08/18 2121 06/09/18 0602  WBC 8.2 7.2 8.2 7.5  NEUTROABS 6.1  --   --   --   HGB 15.7 14.8 15.5 14.6  HCT 45.3 42.7 46.3 43.9  MCV 92.9 93.5 94.1 94.6  PLT 240 213 243 010   Basic Metabolic Panel: Recent Labs  Lab 06/07/18 1102 06/08/18 0428 06/08/18 2121 06/09/18 0602  NA 138 141  --  141  K 3.5 3.3*  --  3.6  CL 107 108  --  111  CO2 22 26  --  22  GLUCOSE 122* 105*  --  100*  BUN 13 13  --  11  CREATININE 1.41* 1.30* 1.43* 1.40*  CALCIUM 8.7* 8.6*  --  9.0  MG  --   --   --  2.3  PHOS  --   --   --  3.6   GFR: Estimated Creatinine Clearance: 73.8 mL/min (A) (by C-G formula based on SCr of 1.4 mg/dL (H)). Liver Function Tests: Recent Labs  Lab 06/07/18 1102  AST 21  ALT 17  ALKPHOS 95  BILITOT 0.6  PROT 7.0  ALBUMIN 4.2   No results for input(s): LIPASE, AMYLASE in the last 168 hours. No results for input(s): AMMONIA in the last 168 hours. Coagulation Profile: Recent Labs  Lab 06/07/18 1102  INR 1.01   Cardiac Enzymes: Recent Labs  Lab 06/07/18 1102  TROPONINI <0.03   BNP (last 3 results) No results for input(s): PROBNP in the last 8760 hours. HbA1C: Recent Labs    06/08/18 0428  HGBA1C 5.4   CBG: No results for input(s): GLUCAP in the last 168 hours. Lipid Profile: Recent Labs    06/08/18 0428  CHOL 151  HDL 27*  LDLCALC 69  TRIG 277*  CHOLHDL 5.6   Thyroid Function Tests: No results for input(s): TSH, T4TOTAL, FREET4, T3FREE, THYROIDAB in the last 72 hours. Anemia Panel: No results for input(s): VITAMINB12, FOLATE, FERRITIN, TIBC, IRON, RETICCTPCT in the last 72 hours. Sepsis Labs: No results for input(s): PROCALCITON, LATICACIDVEN in the last 168 hours.  No results found for  this or any previous visit (from the past 240 hour(s)).       Radiology Studies: US Carotid Bilateral  Result Date: 06/08/2018 CLINICAL DATA:  Cerebral infarction EXAM: BILATERAL CAROTID DUPLEX ULTRASOUND TECHNIQUE: Pearline Cables scale imaging, color Doppler and duplex ultrasound were performed of bilateral carotid and vertebral arteries in the neck. COMPARISON:  None. FINDINGS: Criteria: Quantification of carotid stenosis is based on velocity parameters that correlate the residual internal carotid diameter with NASCET-based stenosis levels, using the diameter of the distal internal carotid lumen as the denominator for stenosis measurement. The following velocity measurements were obtained: RIGHT ICA: 52 cm/sec CCA: 272 cm/sec SYSTOLIC ICA/CCA RATIO:  0.5 ECA: 105 cm/sec LEFT ICA: 40 cm/sec CCA: 536 cm/sec SYSTOLIC ICA/CCA RATIO:  0.4 ECA: 93 cm/sec RIGHT CAROTID ARTERY: Little if any plaque in the bulb. Low resistance internal carotid Doppler pattern is preserved. RIGHT VERTEBRAL ARTERY:  Antegrade. LEFT CAROTID ARTERY: Little if any plaque in the bulb. Low resistance internal carotid Doppler pattern is preserved. LEFT VERTEBRAL ARTERY:  Antegrade. IMPRESSION:  Less than 50% stenosis in the right and left internal carotid arteries. Electronically Signed   By: Marybelle Killings M.D.   On: 06/08/2018 11:48        Scheduled Meds: . ALPRAZolam  0.5 mg Oral BID  . ARIPiprazole  5 mg Oral QHS  . aspirin  81 mg Oral Daily  . clopidogrel  75 mg Oral Daily  . Dextromethorphan-quiNIDine  1 capsule Oral BID  . enoxaparin (LOVENOX) injection  40 mg Subcutaneous Q24H  . escitalopram  20 mg Oral Daily  . fluticasone furoate-vilanterol  1 puff Inhalation Daily  . lamoTRIgine  100 mg Oral BID  . levothyroxine  50 mcg Oral QAC breakfast  . omega-3 acid ethyl esters  1 g Oral BID  . pantoprazole  40 mg Oral Daily  . polycarbophil  1,250 mg Oral QHS  . polyethylene glycol  17 g Oral BID  . rosuvastatin  20 mg Oral  Daily  . topiramate  100 mg Oral QPM  . topiramate  50 mg Oral q morning - 10a   Continuous Infusions:   LOS: 2 days    Time spent: [place], MD [facility]  If 7PM-7AM, please contact night-coverage [email or web address] Password [email or web address] 06/10/2018, 9:55 AM

## 2018-06-10 NOTE — Care Management Note (Signed)
Case Management Note  Patient Details  Name: [name] Love MRN: [id number] Date of Birth: [birth year]  Subjective/Objective: This 47 y.o. male admitted with 4 day h/o numbness to the Rt face, and Rt sided weakness, as well as intermittent diplopia.  He was found to have an acute lacunar thalamic infarct.                   Action/Plan: Case manager spoke with patient and his aunt [name] Love, who is his POA, concerning discharge plan and DME. Choice was offered for [place]. Patient says he has used [facility] in the past, wishes to do so now. Referral was called to Melene Muller, [facility] Liaison. Patient will have support at discharge.    Expected Discharge Date:  pending               Expected Discharge Plan:  [place]  In-House Referral:  NA  Discharge planning Services  CM Consult  Post Acute Care Choice:  Durable Medical Equipment, Home Health Choice offered to:  Patient, [facility] POA / Guardian  DME Arranged:  Walker rolling with seat DME Agency:  [place] Arranged:  PT, OT [place] Agency:  [place]  Status of Service:  In process, will continue to follow  If discussed at Long Length of Stay Meetings, dates discussed:    Additional Comments:  Ninfa Meeker, RN 06/10/2018, 1:35 PM

## 2018-06-10 NOTE — Care Management (Signed)
.DME Physical Therapy Treatment Patient Details Name: [name] Love MRN: [id number] DOB: 1971-08-18 Today's Date: 06/10/2018    History of Present Illness This 47 y.o. male admitted with 4 day h/o numbness to the Rt face, and Rt sided weakness, as well as intermittent diplopia.  He was found to have an acute lacunar thalamic infarct.   PMH includes:  Seizure, recurrent falls, posterior fossa ependymoma s/p multiple resections, programmable VP shunt.     PT Comments    Patient seen for activity progression and assessment of mobility with use of 4 wheeled rollator. Patient with noted improvements in cadence and stability using Rollator. More fluid with gait. Agree with recommendation for 4 wheeled rollator.  Current POC remains appropriate.    Follow Up Recommendations  Home health PT     Equipment Recommendations  Other (comment)(rollator)    Recommendations for Other Services     Precautions / Restrictions Precautions Precautions: Fall Precaution Comments: Pt reports h/o frequent falls  Restrictions Weight Bearing Restrictions: No    Mobility  Bed Mobility Overal bed mobility: Modified Independent General bed mobility comments: Sitting in chair upon PT arrival.   Transfers Overall transfer level: Needs assistance Equipment used: 4-wheeled walker Transfers: Sit to/from Stand Sit to Stand: Supervision General transfer comment: Supervision for safety, one VCs for brakes  Ambulation/Gait Ambulation/Gait assistance: Supervision Assistive device: 4-wheeled walker Gait Pattern/deviations: Step-through pattern;Decreased stride length;Staggering left Gait velocity: decreased Gait velocity interpretation: 1.31 - 2.62 ft/sec, indicative of limited community ambulator General Gait Details: improved cadence and stability with use of 4 wheeled Rollator   Stairs Stairs: Yes Stairs assistance: Supervision Stair Management: Two rails Number of Stairs: 5 General  stair comments: Supervision for safety, some limited RLE clearance noted, no physical assist required   Wheelchair Mobility  Modified Rankin (Stroke Patients Only) Modified Rankin (Stroke Patients Only) Pre-Morbid Rankin Score: Moderate disability Modified Rankin: Moderately severe disability     Balance Overall balance assessment: Needs assistance Sitting-balance support: Feet supported;No upper extremity supported Sitting balance-Leahy Scale: Good Standing balance support: During functional activity Standing balance-Leahy Scale: Fair    Cognition Arousal/Alertness: Awake/alert Behavior During Therapy: WFL for tasks assessed/performed Overall Cognitive Status: No family/caregiver present to determine baseline cognitive functioning General Comments: Pt is [facility] for basic info and ADLs     Exercises    General Comments      Pertinent Vitals/Pain Pain Assessment: No/denies pain    Home Living      Prior Function       PT Goals (current goals can now be found in the care plan section) Acute Rehab PT Goals Patient Stated Goal: to be able to drive  PT Goal Formulation: With patient Time For Goal Achievement: 06/23/18 Potential to Achieve Goals: Good Progress towards PT goals: Progressing toward goals    Frequency    Min 3X/week      PT Plan Current plan remains appropriate    Co-evaluation    AM-PAC PT "6 Clicks" Daily Activity  Outcome Measure  Difficulty turning over in bed (including adjusting bedclothes, sheets and blankets)?: None Difficulty moving from lying on back to sitting on the side of the bed? : None Difficulty sitting down on and standing up from a chair with arms (e.g., wheelchair, bedside commode, etc,.)?: A Little Help needed moving to and from a bed to chair (including a wheelchair)?: A Little Help needed walking in hospital room?: A Little Help needed climbing 3-5 steps with a railing? : A Little 6 Click  Score:  20    End of Session Equipment Utilized During Treatment: Gait belt;Other (comment)(eye patch) Activity Tolerance: Patient tolerated treatment well Patient left: in chair;with call bell/phone within reach;with chair alarm set Nurse Communication: Mobility status PT Visit Diagnosis: Unsteadiness on feet (R26.81);Muscle weakness (generalized) (M62.81);Hemiplegia and hemiparesis Hemiplegia - Right/Left: Right Hemiplegia - dominant/non-dominant: Dominant Hemiplegia - caused by: Cerebral infarction     Time: 0932-0949 PT Time Calculation (min) (ACUTE ONLY): 17 min  Charges:  $Gait Training: 8-22 mins                                                                                                                               Alben Deeds, PT DPT  Board Certified Neurologic Specialist [place] 06/10/2018, 10:30 AM   E

## 2018-06-11 ENCOUNTER — Encounter (HOSPITAL_COMMUNITY): Payer: Self-pay | Admitting: Radiology

## 2018-06-11 ENCOUNTER — Inpatient Hospital Stay (HOSPITAL_COMMUNITY): Payer: Medicare Other

## 2018-06-11 MED ORDER — ASPIRIN 81 MG PO TBEC: 81.0000 mg | DELAYED_RELEASE_TABLET | Freq: Every day | ORAL | 11 refills | Status: DC

## 2018-06-11 MED ORDER — GADOBUTROL 1 MMOL/ML IV SOLN
10.0000 mL | Freq: Once | INTRAVENOUS | Status: AC | PRN
  Administered 2018-06-11: 10 mL via INTRAVENOUS

## 2018-06-11 MED ORDER — CLOPIDOGREL BISULFATE 75 MG PO TABS: 75.0000 mg | ORAL_TABLET | Freq: Every day | ORAL | 0 refills | Status: AC

## 2018-06-11 MED ORDER — ROSUVASTATIN CALCIUM 20 MG PO TABS: 20.0000 mg | ORAL_TABLET | Freq: Every day | ORAL | 1 refills | Status: DC

## 2018-06-11 MED ORDER — CALCIUM POLYCARBOPHIL 625 MG PO TABS: 1250.0000 mg | ORAL_TABLET | Freq: Every day | ORAL | 1 refills | Status: DC

## 2018-06-11 NOTE — Discharge Summary (Signed)
Physician Discharge Summary  [name] Love NLG:[id number] DOB: 1971-04-17 DOA: 06/08/2018  PCP: Isaias Sakai, DO  Admit date: 06/08/2018 Discharge date: 06/11/2018  Admitted From: Home Disposition: Home  Recommendations for Outpatient Follow-up:  1. Follow up with PCP in 1-2 weeks 2. Please obtain BMP/CBC in one week 3. Please follow-up with neurologist in 6 weeks 4. Please take aspirin 81 mg and Plavix 75 mg for 3 weeks until June 30, 2018 and then take aspirin 81 mg alone indefinitely 5. Please follow-up transcranial Dopplers from 06/09/2018  Home Health: Yes, home health PT Equipment/Devices: Yes rolling walker  Discharge Condition: Stable CODE STATUS: Full Diet recommendation:Regular   Brief/Interim Summary:  #) Acute left thalamic CVA: Patient presented with right-sided numbness to [facility].  Patient was unable to obtain MRI as no neurosurgery was on call to reprogram VP shunt after MRI.  Patient was transferred here on 06/08/2018.  Patient was started on aspirin 81 mg and clopidogrel 75 mg to take for a total of 3 weeks and then to switch to aspirin 81 mg only.  He was started on rosuvastatin 20 mg daily.  His hemoglobin A1c was reassuring and his lipid panel was less than 70 LDL.  Carotid ultrasound showed no significant or actionable stenosis.  Echo on 06/08/2018 showed no evidence of cardioembolic source of stroke.  Transcranial Dopplers were performed and results of these are pending.  Neurology was consulted.  Patient received MRI MRA on 06/11/2018 and subsequently had his shunt reprogrammed by neurosurgery.  Patient was evaluated by physical therapy who recommended home health PT.  Patient also received a four-way walking Rollator.  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  #) Seizure disorder: Patient was continued on home lamotrigine and  topiramate.  #) Behavioral disorder/pain/psych: Patient was continued on home escitalopram, aripiprazole, alprazolam, dextromethorphan-quinidine  #) Hypothyroidism: Patient was continued on home levothyroxine.  #) Asthma: Patient was continued on home ICS/LABA and PRN bronchodilators.  Discharge Diagnoses:  Active Problems:   Acute CVA (cerebrovascular accident) ([place])   Seizure ([place])   Recurrent falls   Diplopia    Discharge Instructions  Discharge Instructions    Call MD for:  difficulty breathing, headache or visual disturbances   Complete by:  As directed    Call MD for:  extreme fatigue   Complete by:  As directed    Call MD for:  hives   Complete by:  As directed    Call MD for:  persistant dizziness or light-headedness   Complete by:  As directed    Call MD for:  persistant nausea and vomiting   Complete by:  As directed    Call MD for:  redness, tenderness, or signs of infection (pain, swelling, redness, odor or green/yellow discharge around incision site)   Complete by:  As directed    Call MD for:  severe uncontrolled pain   Complete by:  As directed    Call MD for:  temperature >100.4   Complete by:  As directed    Diet - low sodium heart healthy   Complete by:  As directed    Discharge instructions   Complete by:  As directed    Please follow-up with your primary care doctor in 1 week.  Please take your aspirin and Plavix for 3 weeks until 06/30/2018 and stop your Plavix and continue taking aspirin 81 mg indefinitely.   Increase activity slowly  Complete by:  As directed      Allergies as of 06/11/2018      Reactions   Baclofen    Urinary retension   Seroquel [quetiapine]    Weight gain   Sulfa Antibiotics    Coconut Flavor Rash   ANYTHING COCONUT   Contrast Media [iodinated Diagnostic Agents] Rash      Medication List    STOP taking these medications   ibuprofen 200 MG tablet Commonly known as:  ADVIL,MOTRIN     TAKE these medications    albuterol 108 (90 Base) MCG/ACT inhaler Commonly known as:  PROVENTIL HFA;VENTOLIN HFA Inhale 1-2 puffs into the lungs every 6 (six) hours as needed for wheezing or shortness of breath.   ALPRAZolam 0.5 MG tablet Commonly known as:  XANAX Take 0.5 mg by mouth 2 (two) times daily.   ARIPiprazole 5 MG tablet Commonly known as:  ABILIFY Take 5 mg by mouth at bedtime.   aspirin 81 MG EC tablet Take 1 tablet (81 mg total) by mouth daily. Start taking on:  06/12/2018 What changed:    medication strength  how much to take   clopidogrel 75 MG tablet Commonly known as:  PLAVIX Take 1 tablet (75 mg total) by mouth daily for 19 days.   escitalopram 20 MG tablet Commonly known as:  LEXAPRO Take 20 mg by mouth daily.   fluticasone furoate-vilanterol 100-25 MCG/INH Aepb Commonly known as:  BREO ELLIPTA Inhale 1 puff into the lungs daily.   lamoTRIgine 100 MG tablet Commonly known as:  LAMICTAL TAKE 1 TABLET BY MOUTH TWICE DAILY   levothyroxine 50 MCG tablet Commonly known as:  SYNTHROID, LEVOTHROID Take 50 mcg by mouth daily before breakfast.   NUEDEXTA 20-10 MG Caps Generic drug:  Dextromethorphan-quiNIDine Take 1 capsule by mouth 2 (two) times daily.   omega-3 fish oil 1000 MG Caps capsule Commonly known as:  MAXEPA Take 1 capsule by mouth 2 (two) times daily.   omeprazole 20 MG capsule Commonly known as:  PRILOSEC Take 20 mg by mouth daily.   polycarbophil 625 MG tablet Commonly known as:  FIBERCON Take 2 tablets (1,250 mg total) by mouth at bedtime.   rosuvastatin 20 MG tablet Commonly known as:  CRESTOR Take 1 tablet (20 mg total) by mouth daily.   topiramate 50 MG tablet Commonly known as:  TOPAMAX TAKE 1 TABLET BY MOUTH IN THE MORNING AND 2 IN THE EVENING What changed:    how much to take  how to take this  when to take this            Durable Medical Equipment  (From admission, onward)         Start     Ordered   06/10/18 1254  For home use  only DME 4 wheeled rolling walker with seat  Once    Question:  Patient needs a walker to treat with the following condition  Answer:  Right sided weakness   06/10/18 1254          Allergies  Allergen Reactions  . Baclofen     Urinary retension  . Seroquel [Quetiapine]     Weight gain  . Sulfa Antibiotics   . Coconut Flavor Rash    ANYTHING COCONUT  . Contrast Media [Iodinated Diagnostic Agents] Rash    Consultations: Neurology Neurosurgery  Procedures/Studies: Dg Skull 1-3 Views  Result Date: 06/07/2018 CLINICAL DATA:  47 year old with indwelling ventriculovenous shunt who presents with RIGHT facial and body numbness  that began approximately 5 days ago associated with mild weakness and diplopia. Evaluate the continuity of the shunt catheter. EXAM: SKULL - 1-3 VIEW CHEST  1 VIEW ABDOMEN - 1 VIEW COMPARISON:  CT head earlier same day and previously. Chest x-ray 04/03/2018 and earlier. Abdominal x-rays 11/14/2012. FINDINGS: Ventriculovenous shunt catheter entering from a RIGHT parietal approach with its tip across the midline as noted on the earlier CT head. Shunt catheter tubing appears continuous in the head and neck. No focal osseous abnormalities involving the skull. Paranasal sinuses well aerated. Ventriculovenous shunt catheter with its tip overlying the LOWER SVC, unchanged. The shunt catheter appears intact. Cardiomediastinal silhouette unremarkable, unchanged. Lungs clear. Bronchovascular markings normal. Pulmonary vascularity normal. No visible pleural effusions. No pneumothorax. Bowel gas pattern unremarkable without evidence of obstruction or significant ileus. Moderate stool burden in the colon. Phlebolith low in the RIGHT side of the pelvis. No visible opaque urinary tract calculi. IMPRESSION: 1. Ventriculovenous shunt catheter tubing appears intact with its tip in the LOWER SVC, unchanged. 2.  No acute cardiopulmonary disease. 3. No acute abdominal abnormality. . Electronically  Signed   By: Evangeline Dakin M.D.   On: 06/07/2018 13:20   Dg Chest 1 View  Result Date: 06/07/2018 CLINICAL DATA:  47 year old with indwelling ventriculovenous shunt who presents with RIGHT facial and body numbness that began approximately 5 days ago associated with mild weakness and diplopia. Evaluate the continuity of the shunt catheter. EXAM: SKULL - 1-3 VIEW CHEST  1 VIEW ABDOMEN - 1 VIEW COMPARISON:  CT head earlier same day and previously. Chest x-ray 04/03/2018 and earlier. Abdominal x-rays 11/14/2012. FINDINGS: Ventriculovenous shunt catheter entering from a RIGHT parietal approach with its tip across the midline as noted on the earlier CT head. Shunt catheter tubing appears continuous in the head and neck. No focal osseous abnormalities involving the skull. Paranasal sinuses well aerated. Ventriculovenous shunt catheter with its tip overlying the LOWER SVC, unchanged. The shunt catheter appears intact. Cardiomediastinal silhouette unremarkable, unchanged. Lungs clear. Bronchovascular markings normal. Pulmonary vascularity normal. No visible pleural effusions. No pneumothorax. Bowel gas pattern unremarkable without evidence of obstruction or significant ileus. Moderate stool burden in the colon. Phlebolith low in the RIGHT side of the pelvis. No visible opaque urinary tract calculi. IMPRESSION: 1. Ventriculovenous shunt catheter tubing appears intact with its tip in the LOWER SVC, unchanged. 2.  No acute cardiopulmonary disease. 3. No acute abdominal abnormality. . Electronically Signed   By: Evangeline Dakin M.D.   On: 06/07/2018 13:20   Dg Abd 1 View  Result Date: 06/07/2018 CLINICAL DATA:  47 year old with indwelling ventriculovenous shunt who presents with RIGHT facial and body numbness that began approximately 5 days ago associated with mild weakness and diplopia. Evaluate the continuity of the shunt catheter. EXAM: SKULL - 1-3 VIEW CHEST  1 VIEW ABDOMEN - 1 VIEW COMPARISON:  CT head earlier  same day and previously. Chest x-ray 04/03/2018 and earlier. Abdominal x-rays 11/14/2012. FINDINGS: Ventriculovenous shunt catheter entering from a RIGHT parietal approach with its tip across the midline as noted on the earlier CT head. Shunt catheter tubing appears continuous in the head and neck. No focal osseous abnormalities involving the skull. Paranasal sinuses well aerated. Ventriculovenous shunt catheter with its tip overlying the LOWER SVC, unchanged. The shunt catheter appears intact. Cardiomediastinal silhouette unremarkable, unchanged. Lungs clear. Bronchovascular markings normal. Pulmonary vascularity normal. No visible pleural effusions. No pneumothorax. Bowel gas pattern unremarkable without evidence of obstruction or significant ileus. Moderate stool burden in the colon.  Phlebolith low in the RIGHT side of the pelvis. No visible opaque urinary tract calculi. IMPRESSION: 1. Ventriculovenous shunt catheter tubing appears intact with its tip in the LOWER SVC, unchanged. 2.  No acute cardiopulmonary disease. 3. No acute abdominal abnormality. . Electronically Signed   By: Evangeline Dakin M.D.   On: 06/07/2018 13:20   Ct Head Wo Contrast  Result Date: 06/07/2018 CLINICAL DATA:  Right-sided body numbness for 5 days. Double vision. History of brain tumor and VP shunt. EXAM: CT HEAD WITHOUT CONTRAST TECHNIQUE: Contiguous axial images were obtained from the base of the skull through the vertex without intravenous contrast. COMPARISON:  10/03/2017 FINDINGS: Brain: Coarse calcifications in the cerebellum and to a lesser extent occipital lobes and pons are unchanged and likely related to remote radiation therapy. A right parietal approach ventriculostomy catheter terminates at the level of the left caudate nucleus, unchanged. The ventricles are unchanged in size and configuration without dilatation. Linear calcification in the right frontal lobe is unchanged and may be related to a prior ventriculostomy  catheter. Chronic lacunar infarcts are again seen in the right thalamus and basal ganglia. A lacunar infarct in the posterior inferior left thalamus is new. No acute large territory infarct, intracranial hemorrhage, or midline shift is identified. Low-density subdural fluid collections are again seen bilaterally measuring up to 3 mm in thickness on the right and 4 mm on the left with the left-sided collection being smaller than on the prior study. Vascular: No hyperdense vessel. Skull: Suboccipital craniectomy. Sinuses/Orbits: Visualized paranasal sinuses and mastoid air cells are clear. Visualized orbits are unremarkable. Other: None. IMPRESSION: 1. New left thalamic lacunar infarct. 2. No evidence of acute large territory infarct or intracranial hemorrhage. 3. Unchanged appearance of the ventricles and VP shunt catheter. 4. Small chronic bilateral subdural fluid collections, slightly decreased in size on the left. Electronically Signed   By: Logan Bores M.D.   On: 06/07/2018 12:20   Mr Jodene Nam Head Wo Contrast  Result Date: 06/11/2018 CLINICAL DATA:  Four-day history of numbness involving the right side of the face body. Weakness in the right upper and lower extremity. Abnormal CT scan. EXAM: MRI HEAD WITHOUT CONTRAST MRA HEAD WITHOUT CONTRAST TECHNIQUE: Multiplanar, multiecho pulse sequences of the brain and surrounding structures were obtained without intravenous contrast. Angiographic images of the head were obtained using MRA technique without contrast. COMPARISON:  CT head without contrast 06/07/2018. FINDINGS: MRI HEAD FINDINGS Brain: Diffusion-weighted images confirm an acute nonhemorrhagic infarct involving the left thalamus measuring 7 mm maximally. A punctate subcortical white matter infarct is also present in the lateral left occipital lobe on image 70 of series 5. T2 signal changes are associated with the infarct. There is significant susceptibility from the programmable VP shunt over the right  parietal lobe. Moderate periventricular white matter changes are present bilaterally. There is a remote medial right thalamic infarct measuring 7 mm. Remote lacunar infarcts are present in the cerebellum bilaterally. Postsurgical changes of the posterior fossa are again noted. Postcontrast images demonstrate diffuse dural enhancement likely related shunting. No pathologic enhancement is present otherwise. Vascular: Flow is present in the major intracranial arteries. Skull and upper cervical spine: The skull base is within normal limits. Upper cervical spine is unremarkable. Marrow signal is normal. Sinuses/Orbits: The paranasal sinuses and mastoid air cells are clear. MRA HEAD FINDINGS Internal carotid arteries demonstrate mild tortuosity the cervical segment on the right. There is no significant stenosis from the high cervical segments through the ICA termini bilaterally. The A1  and M1 segments are normal. The anterior communicating artery is patent. MCA bifurcations are within normal limits. ACA and MCA branch vessels are unremarkable. The left vertebral artery is the dominant vessel. AICA vessels are dominant bilaterally. Basilar artery is normal. Both posterior cerebral arteries originate from the basilar tip. PCA branch vessels are within normal limits. IMPRESSION: 1. Acute nonhemorrhagic 7 mm infarct of the left thalamus is confirmed. 2. Punctate acute nonhemorrhagic white matter infarct in the lateral left occipital lobe. 3. Remote lacunar infarcts of the right thalamus and bilateral cerebellum. 4. Postsurgical changes of suboccipital craniotomy and resection of tumor without evidence for residual or recurrent disease. 5. Diffuse dural enhancement is likely related to shunting. 6. Normal variant MRA circle-of-Willis without significant proximal stenosis, aneurysm, or branch vessel occlusion. Electronically Signed   By: San Morelle M.D.   On: 06/11/2018 09:20   Mr Jeri Cos ZO Contrast  Result Date:  06/11/2018 CLINICAL DATA:  Four-day history of numbness involving the right side of the face body. Weakness in the right upper and lower extremity. Abnormal CT scan. EXAM: MRI HEAD WITHOUT CONTRAST MRA HEAD WITHOUT CONTRAST TECHNIQUE: Multiplanar, multiecho pulse sequences of the brain and surrounding structures were obtained without intravenous contrast. Angiographic images of the head were obtained using MRA technique without contrast. COMPARISON:  CT head without contrast 06/07/2018. FINDINGS: MRI HEAD FINDINGS Brain: Diffusion-weighted images confirm an acute nonhemorrhagic infarct involving the left thalamus measuring 7 mm maximally. A punctate subcortical white matter infarct is also present in the lateral left occipital lobe on image 70 of series 5. T2 signal changes are associated with the infarct. There is significant susceptibility from the programmable VP shunt over the right parietal lobe. Moderate periventricular white matter changes are present bilaterally. There is a remote medial right thalamic infarct measuring 7 mm. Remote lacunar infarcts are present in the cerebellum bilaterally. Postsurgical changes of the posterior fossa are again noted. Postcontrast images demonstrate diffuse dural enhancement likely related shunting. No pathologic enhancement is present otherwise. Vascular: Flow is present in the major intracranial arteries. Skull and upper cervical spine: The skull base is within normal limits. Upper cervical spine is unremarkable. Marrow signal is normal. Sinuses/Orbits: The paranasal sinuses and mastoid air cells are clear. MRA HEAD FINDINGS Internal carotid arteries demonstrate mild tortuosity the cervical segment on the right. There is no significant stenosis from the high cervical segments through the ICA termini bilaterally. The A1 and M1 segments are normal. The anterior communicating artery is patent. MCA bifurcations are within normal limits. ACA and MCA branch vessels are  unremarkable. The left vertebral artery is the dominant vessel. AICA vessels are dominant bilaterally. Basilar artery is normal. Both posterior cerebral arteries originate from the basilar tip. PCA branch vessels are within normal limits. IMPRESSION: 1. Acute nonhemorrhagic 7 mm infarct of the left thalamus is confirmed. 2. Punctate acute nonhemorrhagic white matter infarct in the lateral left occipital lobe. 3. Remote lacunar infarcts of the right thalamus and bilateral cerebellum. 4. Postsurgical changes of suboccipital craniotomy and resection of tumor without evidence for residual or recurrent disease. 5. Diffuse dural enhancement is likely related to shunting. 6. Normal variant MRA circle-of-Willis without significant proximal stenosis, aneurysm, or branch vessel occlusion. Electronically Signed   By: San Morelle M.D.   On: 06/11/2018 09:20   US Carotid Bilateral  Result Date: 06/08/2018 CLINICAL DATA:  Cerebral infarction EXAM: BILATERAL CAROTID DUPLEX ULTRASOUND TECHNIQUE: Pearline Cables scale imaging, color Doppler and duplex ultrasound were performed of bilateral carotid and  vertebral arteries in the neck. COMPARISON:  None. FINDINGS: Criteria: Quantification of carotid stenosis is based on velocity parameters that correlate the residual internal carotid diameter with NASCET-based stenosis levels, using the diameter of the distal internal carotid lumen as the denominator for stenosis measurement. The following velocity measurements were obtained: RIGHT ICA: 52 cm/sec CCA: 622 cm/sec SYSTOLIC ICA/CCA RATIO:  0.5 ECA: 105 cm/sec LEFT ICA: 40 cm/sec CCA: 297 cm/sec SYSTOLIC ICA/CCA RATIO:  0.4 ECA: 93 cm/sec RIGHT CAROTID ARTERY: Little if any plaque in the bulb. Low resistance internal carotid Doppler pattern is preserved. RIGHT VERTEBRAL ARTERY:  Antegrade. LEFT CAROTID ARTERY: Little if any plaque in the bulb. Low resistance internal carotid Doppler pattern is preserved. LEFT VERTEBRAL ARTERY:  Antegrade.  IMPRESSION: Less than 50% stenosis in the right and left internal carotid arteries. Electronically Signed   By: Marybelle Killings M.D.   On: 06/08/2018 11:48   06/08/18 US Carotid b/l: IMPRESSION: Less than 50% stenosis in the right and left internal carotid Arteries.  06/09/18 Transcranial doppler:        06/08/2018 echo:- Left ventricle: The cavity size was normal. Systolic function was   normal. The estimated ejection fraction was in the range of 55%   to 60%. Wall motion was normal; there were no regional wall   motion abnormalities.  Impressions:  - No cardiac source of emboli was indentified.  Subjective:   Discharge Exam: Vitals:   06/11/18 0438 06/11/18 0900  BP: 113/69   Pulse: 75 78  Resp: 20 20  Temp: 98 F (36.7 C)   SpO2: 98% 98%   Vitals:   06/10/18 2019 06/10/18 2348 06/11/18 0438 06/11/18 0900  BP: 130/83 99/63 113/69   Pulse: 91 79 75 78  Resp: 20 18 20 20   Temp: 98.2 F (36.8 C) 97.9 F (36.6 C) 98 F (36.7 C)   TempSrc: Oral Oral Oral   SpO2: 96% 98% 98% 98%    General exam: Appears calm and comfortable  Respiratory system: Clear to auscultation. Respiratory effort normal. Cardiovascular system: Regular rate and rhythm, no murmurs Gastrointestinal system: Abdomen is nondistended, soft and nontender. No organomegaly or masses felt. Normal bowel sounds heard. Central nervous system: Alert and oriented.  Cranial nerves II through XII intact, no pronator drift, no right lower extremity weakness, some diminished sensation over right side Extremities: No lower extremity edema Skin: Well-healed incisions of her head Psychiatry: Judgement and insight limited by intellectual disability  The results of significant diagnostics from this hospitalization (including imaging, microbiology, ancillary and laboratory) are listed below for reference.     Microbiology: No results found for this or any previous visit (from the past 240 hour(s)).   Labs: BNP (last 3  results) No results for input(s): BNP in the last 8760 hours. Basic Metabolic Panel: Recent Labs  Lab 06/07/18 1102 06/08/18 0428 06/08/18 2121 06/09/18 0602  NA 138 141  --  141  K 3.5 3.3*  --  3.6  CL 107 108  --  111  CO2 22 26  --  22  GLUCOSE 122* 105*  --  100*  BUN 13 13  --  11  CREATININE 1.41* 1.30* 1.43* 1.40*  CALCIUM 8.7* 8.6*  --  9.0  MG  --   --   --  2.3  PHOS  --   --   --  3.6   Liver Function Tests: Recent Labs  Lab 06/07/18 1102  AST 21  ALT 17  ALKPHOS 95  BILITOT 0.6  PROT 7.0  ALBUMIN 4.2   No results for input(s): LIPASE, AMYLASE in the last 168 hours. No results for input(s): AMMONIA in the last 168 hours. CBC: Recent Labs  Lab 06/07/18 1127 06/08/18 0428 06/08/18 2121 06/09/18 0602  WBC 8.2 7.2 8.2 7.5  NEUTROABS 6.1  --   --   --   HGB 15.7 14.8 15.5 14.6  HCT 45.3 42.7 46.3 43.9  MCV 92.9 93.5 94.1 94.6  PLT 240 213 243 217   Cardiac Enzymes: Recent Labs  Lab 06/07/18 1102  TROPONINI <0.03   BNP: Invalid input(s): POCBNP CBG: No results for input(s): GLUCAP in the last 168 hours. D-Dimer No results for input(s): DDIMER in the last 72 hours. Hgb A1c No results for input(s): HGBA1C in the last 72 hours. Lipid Profile No results for input(s): CHOL, HDL, LDLCALC, TRIG, CHOLHDL, LDLDIRECT in the last 72 hours. Thyroid function studies No results for input(s): TSH, T4TOTAL, T3FREE, THYROIDAB in the last 72 hours.  Invalid input(s): FREET3 Anemia work up No results for input(s): VITAMINB12, FOLATE, FERRITIN, TIBC, IRON, RETICCTPCT in the last 72 hours. Urinalysis    Component Value Date/Time   COLORURINE Yellow 11/13/2012 1300   APPEARANCEUR Clear 11/13/2012 1300   LABSPEC 1.020 11/13/2012 1300   PHURINE 5.0 11/13/2012 1300   GLUCOSEU Negative 11/13/2012 1300   HGBUR Negative 11/13/2012 1300   BILIRUBINUR Negative 11/13/2012 1300   KETONESUR Negative 11/13/2012 1300   PROTEINUR Negative 11/13/2012 1300   NITRITE  Negative 11/13/2012 1300   LEUKOCYTESUR Negative 11/13/2012 1300   Sepsis Labs Invalid input(s): PROCALCITONIN,  WBC,  LACTICIDVEN Microbiology No results found for this or any previous visit (from the past 240 hour(s)).   Time coordinating discharge: 35  SIGNED:   Cristy Folks, MD  [facility] 06/11/2018, 10:06 AM  If 7PM-7AM, please contact night-coverage [email or web address] Password [email or web address]

## 2018-06-11 NOTE — Progress Notes (Signed)
MRI scan performed this morning.  Results demonstrate the thalamic infarct noted on initial CT.  No evidence of recurrent posterior fossa tumor.  VP shunt interrogated and reprogrammed to 60 mm of water.  Follow-up with Dr. Christella Noa as needed as an outpatient.

## 2018-06-11 NOTE — Progress Notes (Signed)
STROKE TEAM PROGRESS NOTE  INTERVAL HISTORY Met pt aunt in the hallway. Pt is in room ready to be discharged. He has no acute events overnight, still waring the eye patch. MRI showed left thalamic lacunar infarct but also see one punctate infarct at left PCA/MCA territory white matter. Tumor s/p surgery stable.  Vitals:   06/10/18 2019 06/10/18 2348 06/11/18 0438 06/11/18 0900  BP: 130/83 99/63 113/69   Pulse: 91 79 75 78  Resp: '20 18 20 20  '$ Temp: 98.2 F (36.8 C) 97.9 F (36.6 C) 98 F (36.7 C)   TempSrc: Oral Oral Oral   SpO2: 96% 98% 98% 98%    CBC:  Recent Labs  Lab 06/07/18 1127  06/08/18 2121 06/09/18 0602  WBC 8.2   < > 8.2 7.5  NEUTROABS 6.1  --   --   --   HGB 15.7   < > 15.5 14.6  HCT 45.3   < > 46.3 43.9  MCV 92.9   < > 94.1 94.6  PLT 240   < > 243 217   < > = values in this interval not displayed.    Basic Metabolic Panel:  Recent Labs  Lab 06/08/18 0428 06/08/18 2121 06/09/18 0602  NA 141  --  141  K 3.3*  --  3.6  CL 108  --  111  CO2 26  --  22  GLUCOSE 105*  --  100*  BUN 13  --  11  CREATININE 1.30* 1.43* 1.40*  CALCIUM 8.6*  --  9.0  MG  --   --  2.3  PHOS  --   --  3.6   Lipid Panel:     Component Value Date/Time   CHOL 151 06/08/2018 0428   TRIG 277 (H) 06/08/2018 0428   HDL 27 (L) 06/08/2018 0428   CHOLHDL 5.6 06/08/2018 0428   VLDL 55 (H) 06/08/2018 0428   LDLCALC 69 06/08/2018 0428   HgbA1c:  Lab Results  Component Value Date   HGBA1C 5.4 06/08/2018   Urine Drug Screen: No results found for: LABOPIA, COCAINSCRNUR, LABBENZ, AMPHETMU, THCU, LABBARB  Alcohol Level No results found for: [facility]  IMAGING  Dg Skull 1-3 Views 06/07/2018 IMPRESSION:  1. Ventriculovenous shunt catheter tubing appears intact with its tip in the LOWER SVC, unchanged.  2.  No acute cardiopulmonary disease.  3. No acute abdominal abnormality.   Dg Chest 1 View 06/07/2018 IMPRESSION:  1. Ventriculovenous shunt catheter tubing appears intact with its tip  in the LOWER SVC, unchanged.  2.  No acute cardiopulmonary disease.  3. No acute abdominal abnormality.   Dg Abd 1 View 06/07/2018 IMPRESSION:  1. Ventriculovenous shunt catheter tubing appears intact with its tip in the LOWER SVC, unchanged.  2.  No acute cardiopulmonary disease.  3. No acute abdominal abnormality.   Ct Head Wo Contrast 06/07/2018 IMPRESSION:  1. New left thalamic lacunar infarct.  2. No evidence of acute large territory infarct or intracranial hemorrhage.  3. Unchanged appearance of the ventricles and VP shunt catheter.  4. Small chronic bilateral subdural fluid collections, slightly decreased in size on the left.   US Carotid Bilateral 06/08/2018 IMPRESSION:  Less than 50% stenosis in the right and left internal carotid arteries.   MRI / MRA Head W & WO Contrast 06/11/2018 IMPRESSION: 1. Acute nonhemorrhagic 7 mm infarct of the left thalamus is confirmed. 2. Punctate acute nonhemorrhagic white matter infarct in the lateral left occipital lobe. 3. Remote lacunar infarcts of  the right thalamus and bilateral cerebellum. 4. Postsurgical changes of suboccipital craniotomy and resection of tumor without evidence for residual or recurrent disease. 5. Diffuse dural enhancement is likely related to shunting. 6. Normal variant MRA circle-of-Willis without significant proximal stenosis, aneurysm, or branch vessel occlusion.  2D Echocardiogram  - Left ventricle: The cavity size was normal. Systolic function was normal. The estimated ejection fraction was in the range of 55% to 60%. Wall motion was normal; there were no regional wall motion abnormalities. Impressions:  No cardiac source of emboli was indentified.  Carotid Doppler   Less than 50% stenosis in the right and left internal carotid arteries.   PHYSICAL EXAM Resident middle-age Caucasian male currently not in distress. He is hard of hearing despite his hearing aid. Afebrile. Head is nontraumatic. Neck is supple  without bruit.    Cardiac exam no murmur or gallop. Lungs are clear to auscultation. Distal pulses are well felt. Neurological Exam ;  Awake  Alert oriented x 3. Normal speech and language, however mild psychomotor slowing. eye movements full without nystagmus. fundi were not visualized. Vision acuity and fields appear normal. Hearing is normal. Palatal movements are normal. Face symmetric. Tongue midline. Normal strength, tone, reflexes and coordination. Diminished right hemibody touch and pinprickl sensation including forehead and splits midline. Splits vibration sensation over forehead as well.  Coordination intact bilaterally, gait and stance intact while walking in the room.   ASSESSMENT/PLAN Mr. [name] Love is a 47 y.o. 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  Stroke: subacute left thalamic lacunar infarct as well as left occipital punctate infarct. Pt lack of typical stroke risk factors, concerning stroke most likely related to pt hx of brain radiation in the past. However, given separate location of stroke, will recommend 30 day cardiac event monitoring to rule out afib as outpt   CT head L thalamic lacune. Unchanged ventricles and BP shunt catheters. Small B subdural fluid collections  MRI -  Lacunar infarct left thalamus and punctate acute white matter infarct in the lateral left occipital lobe. Remote lacunar infarcts of the right thalamus and bilateral cerebellum. No evidence of tumor recurrence.  MRA - normal  Carotid Doppler  < 50% bilateral ICA  2D Echo  EF 55-60%. No source of embolus   given separate location of stroke, will recommend 30 day cardiac event monitoring to rule out afib as outpt   LDL 69  HgbA1c 5.4  Lovenox 40 mg sq daily for VTE  prophylaxis  aspirin 325 mg daily prior to admission, now on aspirin 81 mg and plavix 75 mg daily x 3 weeks, then ASA alone.    Therapy recommendations: HH PT  Disposition:  pending   History of seizure  On lamictal 100 bid and topamax 100 q hs  Seizure prevention  History of posterior fossa ependymoma  s/p OR with shunt placement in 1995 by Dr. Christella Noa with multiple revisions, resultant seizures   S/p 62 sessions of brain radiation  MRI with contrast ruled out tumor recurrence  Dr. Trenton Gammon has reprogrammed the shunt after MRI  Follow up with Dr. Christella Noa as outpt  Diplopia  Chronic on eye patch  f/u with MD at [facility] as previously scheduled  Other Stroke Risk Factors  Previous lacunar infarcts by imaging  Other Active Problems  Behaviour d/o, pain, pscych  Hypothyroidism  Asthma  Hearing loss  Hospital day # 3  Neurology will sign off. Please call with questions. Pt will follow up with Dr. Jannifer Franklin at [facility] on 08/15/18. Thanks for the consult.  Rosalin Hawking, MD PhD Stroke Neurology 06/11/2018 3:28 PM    To contact Stroke Continuity provider, please refer to [email or web address] After hours, contact General Neurology

## 2018-06-11 NOTE — Progress Notes (Signed)
Occupational Therapy Progress Note (late entry)  Pt is now able to perform ADLs with supervision.   He reports his diplopia is worse this day, but has been fluctuating frequently over the past month.  He prefers patching to the occlusion glasses.   He prefers [place] [facility] due to limited transportation, therefore, recommendation changed to [facility].  No DME recommended.  Recommend HHOT, PT    06/10/18 1100  OT Visit Information  Last OT Received On 06/11/18  Assistance Needed +1  History of Present Illness This 47 y.o. male admitted with 4 day h/o numbness to the Rt face, and Rt sided weakness, as well as intermittent diplopia.  He was found to have an acute lacunar thalamic infarct.   PMH includes:  Seizure, recurrent falls, posterior fossa ependymoma s/p multiple resections, programmable VP shunt.   Precautions  Precautions Fall  Precaution Comments Pt reports h/o frequent falls   Pain Assessment  Pain Assessment No/denies pain  Cognition  Arousal/Alertness Awake/alert  Behavior During Therapy WFL for tasks assessed/performed  Overall Cognitive Status No family/caregiver present to determine baseline cognitive functioning  General Comments Pt is [facility] for basic info and ADLs   ADL  Overall ADL's  Needs assistance/impaired  Eating/Feeding Independent  Grooming Wash/dry hands;Wash/dry face;Oral care;Brushing hair;Supervision/safety;Standing  Lower Body Dressing Supervision/safety;Sit to/from stand  Tub/ [employer] guard;Ambulation;Shower Dealer Details (indicate cue type and reason) Pt able to step over tub with min guard assist.  He does report he has a tub transfer bench.  Discussed using bench and not attempt to step over tub   Functional mobility during ADLs Supervision/safety;Min guard;Rolling walker  General ADL Comments Pt very irritable today re: MRI delay   Vision- Assessment  Additional Comments Pt reports diplopia at all times (he reports it has  fluctuated for over a month).  He reports he does not like the occlusion glasses, and prefers the patch.  He is independent with switching the patch hourly   Transfers  Overall transfer level Needs assistance  Equipment used 4-wheeled walker  Transfers Sit to/from [employer] Transfers  Sit to Stand Supervision  Stand pivot transfers Supervision  General Comments  General comments (skin integrity, edema, etc.) Discussed OP OT with pt, he does not feel his aunt can consistently provide transport, and is concerned about co pay   OT - End of Session  Equipment Utilized During Treatment Gait belt;Rolling walker  Activity Tolerance Patient tolerated treatment well  Patient left in chair;with call bell/phone within reach;with chair alarm set  Nurse Communication Mobility status  OT Assessment/Plan  OT Plan Discharge plan needs to be updated;Equipment recommendations need to be updated  OT Visit Diagnosis Unsteadiness on feet (R26.81);Low vision, both eyes (H54.2)  OT Frequency (ACUTE ONLY) Min 2X/week  Follow Up Recommendations Home health OT;Supervision/Assistance - 24 hour  OT Equipment None recommended by OT  AM-PAC OT "6 Clicks" Daily Activity Outcome Measure  Help from another person eating meals? 4  Help from another person taking care of personal grooming? 4  Help from another person toileting, which includes using toliet, bedpan, or urinal? 4  Help from another person bathing (including washing, rinsing, drying)? 4  Help from another person to put on and taking off regular upper body clothing? 4  Help from another person to put on and taking off regular lower body clothing? 4  6 Click Score 24  ADL G Code Conversion CH  OT Goal Progression  Progress towards OT goals Progressing toward  goals  OT Time Calculation  OT Start Time (ACUTE ONLY) 1002  OT Stop Time (ACUTE ONLY) 1025  OT Time Calculation (min) 23 min  OT General Charges  $OT Visit 1 Visit  OT Treatments  $Self  Care/Home Management  23-37 mins  Lucille Passy, OTR/L Acute Rehabilitation Services Pager [phone] Office [phone]

## 2018-06-11 NOTE — Progress Notes (Signed)
Nurse went over discharge instructions with patient and family. Patient and family verbalize understanding of discharge teaching. All questions and concerns addressed. Discharging home with all belongings. Taken down in a wheelchair.

## 2018-06-11 NOTE — Discharge Instructions (Signed)
Hospital Discharge After a Stroke  Being discharged from the hospital after a stroke can feel overwhelming. Many things may be different, and it is normal to feel scared or anxious. Some stroke survivors may be able to return to their homes, and others may need more specialized care on a temporary or permanent basis. Your stroke care team will work with you to develop a discharge plan that is best for you. Ask questions if you do not understand something. Invite a friend or family member to participate in discharge planning. Understanding and following your discharge plan can help to prevent another stroke or other problems. Understanding your medicines After a stroke, your health care provider may prescribe one or more types of medicine. It is important to take medicines exactly as told by your health care provider. Serious harm, such as another stroke, can happen if you are unable to take your medicine exactly as prescribed. Make sure you understand:  What medicine to take.  Why you are taking the medicine.  How and when to take it.  If it can be taken with your other medicines and herbal supplements.  Possible side effects.  When to call your health care provider if you have any side effects.  How you will get and pay for your medicines. Medical assistance programs may be able to help you pay for prescription medicines if you cannot afford them.  If you are taking an anticoagulant, be sure to take it exactly as told by your health care provider. This type of medicine can increase the risk of bleeding because it works to prevent blood from clotting. You may need to take certain precautions to prevent bleeding. You should contact your health care provider if you have:  Bleeding or bruising.  A fall or other injury to your head.  Blood in your urine or stool (feces).  Planning for home safety Take steps to prevent falls, such as installing grab bars or using a shower chair. Ask a friend  or family member to get needed things in place before you go home if possible. A therapist can come to your home to make recommendations for safety equipment. Ask your health care provider if you would benefit from this service or from home care. Getting needed equipment Ask your health care provider for a list of any medical equipment and supplies you will need at home. These may include items such as:  Walkers.  Canes.  Wheelchairs.  Hand-strengthening devices.  Special eating utensils.  Medical equipment can be rented or purchased, depending on your insurance coverage. Check with your insurance company about what is covered. Keeping follow-up visits After a stroke, you will need to follow up regularly with a health care provider. You may also need rehabilitation, which can include physical therapy, occupational therapy, or speech-language therapy. Keeping these appointments is very important to your recovery after a stroke. Be sure to bring your medicine list and discharge papers with you to your appointments. If you need help to keep track of your schedule, use a calendar or appointment reminder. Preventing another stroke Having a stroke puts you at risk for another stroke in the future. Ask your health care provider what actions you can take to lower the risk. These may include:  Increasing how much you exercise.  Making a healthy eating plan.  Quitting smoking.  Managing other health conditions, such as high blood pressure, high cholesterol, or diabetes.  Limiting alcohol use.  Knowing the warning signs of a stroke  Make sure you understand the signs of a stroke. Before you leave the hospital, you will receive information outlining the stroke warning signs. Share these with your friends and family members. °"BE FAST" is an easy way to remember the main warning signs of a stroke: °· B - Balance. Signs are dizziness, sudden trouble walking, or loss of balance. °· E - Eyes. Signs  are trouble seeing or a sudden change in vision. °· F - Face. Signs are sudden weakness or numbness of the face, or the face or eyelid drooping on one side. °· A - Arms. Signs are weakness or numbness in an arm. This happens suddenly and usually on one side of the body. °· S - Speech. Signs are sudden trouble speaking, slurred speech, or trouble understanding what people say. °· T - Time. Time to call emergency services. Write down what time symptoms started. ° °Other signs of stroke may include: °· A sudden, severe headache with no known cause. °· Nausea or vomiting. °· Seizure. ° °These symptoms may represent a serious problem that is an emergency. Do not wait to see if the symptoms will go away. Get medical help right away. Call your local emergency services (911 in the [place] Do not drive yourself to the hospital. °Make note of the time that you had your first symptoms. Your emergency responders or emergency room staff will need to know this information. °Summary °· Being discharged from the hospital after a stroke can feel overwhelming. It is normal to feel scared or anxious. °· Make sure you take medicines exactly as told by your health care provider. °· Know the warning signs of a stroke, and get help right way if you have any of these symptoms. "BE FAST" is an easy way to remember the main warning signs of a stroke. °This information is not intended to replace advice given to you by your health care provider. Make sure you discuss any questions you have with your health care provider. °Document Released: 12/24/2016 Document Revised: 12/24/2016 Document Reviewed: 12/24/2016 °Elsevier Interactive Patient Education © 2018 [employer] ° °

## 2018-06-12 ENCOUNTER — Telehealth: Payer: Self-pay | Admitting: *Deleted

## 2018-06-12 NOTE — Telephone Encounter (Signed)
[name] Love (on [place]). Scheduled follow up for 06/23/18 at 8:30am. She verbalized understanding and appreciation.

## 2018-06-13 ENCOUNTER — Telehealth: Payer: Self-pay | Admitting: Neurology

## 2018-06-13 DIAGNOSIS — H903 Sensorineural hearing loss, bilateral: Secondary | ICD-10-CM | POA: Diagnosis not present

## 2018-06-13 DIAGNOSIS — Z7982 Long term (current) use of aspirin: Secondary | ICD-10-CM | POA: Diagnosis not present

## 2018-06-13 DIAGNOSIS — F329 Major depressive disorder, single episode, unspecified: Secondary | ICD-10-CM | POA: Diagnosis not present

## 2018-06-13 DIAGNOSIS — F419 Anxiety disorder, unspecified: Secondary | ICD-10-CM | POA: Diagnosis not present

## 2018-06-13 DIAGNOSIS — I69398 Other sequelae of cerebral infarction: Secondary | ICD-10-CM | POA: Diagnosis not present

## 2018-06-13 DIAGNOSIS — D432 Neoplasm of uncertain behavior of brain, unspecified: Secondary | ICD-10-CM | POA: Diagnosis not present

## 2018-06-13 DIAGNOSIS — G40909 Epilepsy, unspecified, not intractable, without status epilepticus: Secondary | ICD-10-CM | POA: Diagnosis not present

## 2018-06-13 DIAGNOSIS — Z79899 Other long term (current) drug therapy: Secondary | ICD-10-CM | POA: Diagnosis not present

## 2018-06-13 DIAGNOSIS — R2689 Other abnormalities of gait and mobility: Secondary | ICD-10-CM | POA: Diagnosis not present

## 2018-06-13 DIAGNOSIS — H532 Diplopia: Secondary | ICD-10-CM | POA: Diagnosis not present

## 2018-06-13 DIAGNOSIS — I69351 Hemiplegia and hemiparesis following cerebral infarction affecting right dominant side: Secondary | ICD-10-CM | POA: Diagnosis not present

## 2018-06-13 DIAGNOSIS — Z982 Presence of cerebrospinal fluid drainage device: Secondary | ICD-10-CM | POA: Diagnosis not present

## 2018-06-13 DIAGNOSIS — Z9181 History of falling: Secondary | ICD-10-CM | POA: Diagnosis not present

## 2018-06-13 NOTE — Telephone Encounter (Signed)
Chris/[facility] [phone] request VO PT 2 x 4. Please call to advise

## 2018-06-13 NOTE — Telephone Encounter (Signed)
I called and gave a verbal order for physical therapy, patient has had recent stroke.

## 2018-06-23 ENCOUNTER — Ambulatory Visit: Payer: Medicare Other | Admitting: Neurology

## 2018-06-23 ENCOUNTER — Encounter: Payer: Self-pay | Admitting: Neurology

## 2018-06-23 VITALS — BP 126/88 | HR 91 | Ht 68.75 in | Wt 224.0 lb

## 2018-06-23 DIAGNOSIS — I63432 Cerebral infarction due to embolism of left posterior cerebral artery: Secondary | ICD-10-CM | POA: Diagnosis not present

## 2018-06-23 DIAGNOSIS — I480 Paroxysmal atrial fibrillation: Secondary | ICD-10-CM | POA: Diagnosis not present

## 2018-06-23 DIAGNOSIS — R269 Unspecified abnormalities of gait and mobility: Secondary | ICD-10-CM

## 2018-06-23 DIAGNOSIS — I639 Cerebral infarction, unspecified: Secondary | ICD-10-CM

## 2018-06-23 DIAGNOSIS — R569 Unspecified convulsions: Secondary | ICD-10-CM | POA: Diagnosis not present

## 2018-06-23 MED ORDER — GABAPENTIN 300 MG PO CAPS: 300.0000 mg | ORAL_CAPSULE | Freq: Two times a day (BID) | ORAL | 3 refills | Status: DC

## 2018-06-23 NOTE — Progress Notes (Signed)
Reason for visit: Stroke  Referring physician: [facility]  [name] Love is a 47 y.o. male  History of present illness:  Mr. 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  The patient sustained a stroke event necessitating a hospital admission on 08 June 2018.  The patient had onset several days prior to admission with right-sided numbness, and increased gait instability.  The patient has also developed double vision that was going on before the hospitalization.  The patient reports a vertical and horizontal separation of images that states the same when he looks up, down, or to the right or left.  The patient is wearing an eye patch for this purpose.  The patient is now using a walker for ambulation, he is getting home health physical therapy and occupational Therapy.  He believes that his current walker is too small for him, he is unstable when he is using it.  He has a tendency to veer to the right with walking.  He reports ongoing right-sided numbness, he has developed some discomfort in the right side with burning sensations in the right leg, tingling in the right arm.  The patient has not had any recent seizure events.  He has not had any falls since returning home from the hospital.  The patient denies any issues controlling the bowels or the bladder, he feels somewhat weak on the right side.  He returns for an evaluation.  The MRI of the brain showed the possibility of an embolic stroke referable to the left posterior cerebral artery affecting the left thalamus and left occipital lobe.  Past Medical History:  Diagnosis Date  . Abnormal gait   . Allergic rhinitis   . Anxiety   . Bilateral arm weakness   . Brain cancer ([place])   . Depression   . 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 Hearing loss, sensorineural   . Hypothyroidism   . Insomnia   .  Left leg weakness   . Low vitamin B12 level   . Seizures ([place])     Past Surgical History:  Procedure Laterality Date  . BRAIN SURGERY    . HERNIA REPAIR    . SHUNT REVISION      Family History  Problem Relation Age of Onset  . Brain cancer Mother   . High blood pressure Mother     Social history:  reports that he has never smoked. He has never used smokeless tobacco. He reports that he does not drink alcohol or use drugs.  Medications:  Prior to Admission medications   Medication Sig Start Date End Date Taking? Authorizing Provider  albuterol (PROVENTIL HFA;VENTOLIN HFA) 108 (90 Base) MCG/ACT inhaler Inhale 1-2 puffs into the lungs every 6 (six) hours as needed for wheezing or shortness of breath. 04/03/18  Yes Rigoberto Noel, MD  ALPRAZolam Duanne Moron) 0.5 MG tablet Take 0.5 mg by mouth 2 (two) times daily. 05/30/18  Yes [provider]  ARIPiprazole (ABILIFY) 10 MG tablet Take 10 mg by mouth daily.   Yes [provider]  aspirin EC 81 MG EC tablet Take 1 tablet (81 mg total) by mouth daily. 06/12/18  Yes Purohit, Konrad Dolores, MD  clopidogrel (PLAVIX) 75 MG tablet Take 1 tablet (75 mg total) by mouth daily for 19 days. 06/11/18 06/30/18 Yes Purohit, Konrad Dolores, MD  escitalopram (LEXAPRO) 20 MG tablet  Take 20 mg by mouth daily.  12/31/16  Yes [provider]  fluticasone furoate-vilanterol (BREO ELLIPTA) 100-25 MCG/INH AEPB Inhale 1 puff into the lungs daily. 04/10/18  Yes Rigoberto Noel, MD  lamoTRIgine (LAMICTAL) 100 MG tablet TAKE 1 TABLET BY MOUTH TWICE DAILY 02/09/18  Yes Kathrynn Ducking, MD  levothyroxine (SYNTHROID, LEVOTHROID) 50 MCG tablet Take 50 mcg by mouth daily before breakfast.  12/25/16  Yes [provider]  NUEDEXTA 20-10 MG CAPS Take 1 capsule by mouth 2 (two) times daily. 12/31/16  Yes [provider]  omega-3 fish oil (MAXEPA) 1000 MG CAPS capsule Take 1 capsule by mouth 2 (two) times daily.   Yes [provider]  omeprazole  (PRILOSEC) 20 MG capsule Take 20 mg by mouth daily.   Yes [provider]  polycarbophil (FIBERCON) 625 MG tablet Take 2 tablets (1,250 mg total) by mouth at bedtime. 06/11/18  Yes Purohit, Konrad Dolores, MD  rosuvastatin (CRESTOR) 20 MG tablet Take 1 tablet (20 mg total) by mouth daily. 06/11/18 07/11/18 Yes Purohit, Konrad Dolores, MD  topiramate (TOPAMAX) 50 MG tablet TAKE 1 TABLET BY MOUTH IN THE MORNING AND 2 IN THE EVENING Patient taking differently: Take 50-100 mg by mouth 2 (two) times daily. TAKE 1 TABLET BY MOUTH IN THE MORNING AND 2 IN THE EVENING 02/20/18  Yes Kathrynn Ducking, MD      Allergies  Allergen Reactions  . Baclofen     Urinary retension  . Seroquel [Quetiapine]     Weight gain  . Sulfa Antibiotics   . Coconut Flavor Rash    ANYTHING COCONUT  . Contrast Media [Iodinated Diagnostic Agents] Rash    ROS:  Out of a complete 14 system review of symptoms, the patient complains only of the following symptoms, and all other reviewed systems are negative.  Fatigue Hearing loss Blurred vision, double vision Easy bruising, easy bleeding Feeling cold Joint pain Memory loss, confusion, headache, numbness, weakness, dizziness Depression, bad dreams  Blood pressure 126/88, pulse 91, height 5\' 8"  (1.727 m), weight 224 lb (101.6 kg).  Physical Exam  General: The patient is alert and cooperative at the time of the examination.  The patient is markedly obese.  Eyes: Pupils are equal, round, and reactive to light. Discs are flat bilaterally.  Neck: The neck is supple, no carotid bruits are noted.  Respiratory: The respiratory examination is clear.  Cardiovascular: The cardiovascular examination reveals a regular rate and rhythm, no obvious murmurs or rubs are noted.  Skin: Extremities are without significant edema.  Neurologic Exam  Mental status: The patient is alert and oriented x 3 at the time of the examination. The patient has apparent normal recent and remote  memory, with an apparently normal attention span and concentration ability.  Cranial nerves: Facial symmetry is present. There is good sensation on the left face, decreased on the right to soft touch and pinprick. The strength of the facial muscles and the muscles to head turning and shoulder shrug are normal bilaterally. Speech is well enunciated, no aphasia or dysarthria is noted. Extraocular movements are full, and gaze nystagmus is present bilaterally. Visual fields are full. The tongue is midline, and the patient has symmetric elevation of the soft palate. No obvious hearing deficits are noted.  Motor: The motor testing reveals 5 over 5 strength of all 4 extremities, it with exception of decreased grip strength on the right. Good symmetric motor tone is noted throughout.  Sensory: Sensory testing is notable  for decreased soft touch, pinprick, and vibration sensation on the right arm and leg as compared to the left. No evidence of extinction is noted.  Coordination: Cerebellar testing reveals good finger-nose-finger and heel-to-shin bilaterally.  Gait and station: Gait is wide-based, unsteady, the patient tends to veer to the right.  With walking with a walker, the patient also appears to the right.  The patient has a positive Romberg, he falls backwards.  Tandem gait was not attempted.  Reflexes: Deep tendon reflexes are symmetric and normal bilaterally. Toes are downgoing bilaterally.   MRI brain and MRA head 06/11/18:  IMPRESSION: 1. Acute nonhemorrhagic 7 mm infarct of the left thalamus is confirmed. 2. Punctate acute nonhemorrhagic white matter infarct in the lateral left occipital lobe. 3. Remote lacunar infarcts of the right thalamus and bilateral cerebellum. 4. Postsurgical changes of suboccipital craniotomy and resection of tumor without evidence for residual or recurrent disease. 5. Diffuse dural enhancement is likely related to shunting. 6. Normal variant MRA circle-of-Willis  without significant proximal stenosis, aneurysm, or branch vessel occlusion.  * MRI scan images were reviewed online. I agree with the written report.   Ct Head Wo Contrast 06/07/2018 IMPRESSION:  1. New left thalamic lacunar infarct.  2. No evidence of acute large territory infarct or intracranial hemorrhage.  3. Unchanged appearance of the ventricles and VP shunt catheter.  4. Small chronic bilateral subdural fluid collections, slightly decreased in size on the left.   US Carotid Bilateral 06/08/2018 IMPRESSION:  Less than 50% stenosis in the right and left internal carotid arteries.   2D Echocardiogram  - Left ventricle: The cavity size was normal. Systolic function wasnormal. The estimated ejection fraction was in the range of 55%to 60%. Wall motion was normal; there were no regional wallmotion abnormalities. Impressions:  No cardiac source of emboli was indentified.    Assessment/Plan:  1.  History of ependymoma resection, posterior fossa  2.  History of seizures  3.  Chronic pain syndrome  4.  Recent left thalamic and occipital stroke, possibly embolic  5.  Thalamic pain syndrome  The patient will be placed on gabapentin 300 mg twice daily, a prescription for a new walker was given, he needs a walker that is larger and wider based to maintain stability.  He will continue home health physical and occupational therapy.  A 30-day cardiac monitor study will be set up.  The patient will follow-up in 3 months.  Jill Alexanders MD 06/23/2018 8:43 AM  [facility] [address] [place], [place] [address]  Phone [phone] Fax [phone]

## 2018-06-26 ENCOUNTER — Telehealth: Payer: Self-pay | Admitting: Neurology

## 2018-06-26 NOTE — Telephone Encounter (Signed)
PT [facility]) has called wanting to report numbness pt has reported having since last night on right side of lips and tongue.  PT [facility]) is asking for a call on  [phone]

## 2018-06-26 NOTE — Telephone Encounter (Signed)
I called [name] Love, the patient has reported some increased numbness on the right tongue last evening, there are no other symptoms of speech or swallowing changes, alterations in strength or changes in sensation on the body, I would continue to observe this for now, I do not think that this is significant enough to warrant an emergency room visit.

## 2018-06-26 NOTE — Telephone Encounter (Signed)
This addendum was created in error

## 2018-06-28 ENCOUNTER — Other Ambulatory Visit: Payer: Self-pay | Admitting: Neurology

## 2018-07-03 ENCOUNTER — Ambulatory Visit: Payer: Self-pay | Admitting: Pulmonary Disease

## 2018-07-03 DIAGNOSIS — F064 Anxiety disorder due to known physiological condition: Secondary | ICD-10-CM | POA: Diagnosis not present

## 2018-07-03 DIAGNOSIS — F0634 Mood disorder due to known physiological condition with mixed features: Secondary | ICD-10-CM | POA: Diagnosis not present

## 2018-07-03 DIAGNOSIS — F482 Pseudobulbar affect: Secondary | ICD-10-CM | POA: Diagnosis not present

## 2018-07-04 ENCOUNTER — Ambulatory Visit (INDEPENDENT_AMBULATORY_CARE_PROVIDER_SITE_OTHER): Payer: Medicare Other

## 2018-07-04 ENCOUNTER — Other Ambulatory Visit: Payer: Self-pay | Admitting: Neurology

## 2018-07-04 DIAGNOSIS — I480 Paroxysmal atrial fibrillation: Secondary | ICD-10-CM | POA: Diagnosis not present

## 2018-07-04 DIAGNOSIS — I639 Cerebral infarction, unspecified: Secondary | ICD-10-CM

## 2018-07-04 DIAGNOSIS — I63432 Cerebral infarction due to embolism of left posterior cerebral artery: Secondary | ICD-10-CM

## 2018-07-04 DIAGNOSIS — C719 Malignant neoplasm of brain, unspecified: Secondary | ICD-10-CM | POA: Diagnosis not present

## 2018-07-12 ENCOUNTER — Telehealth: Payer: Self-pay | Admitting: Neurology

## 2018-07-12 MED ORDER — GABAPENTIN 300 MG PO CAPS: ORAL_CAPSULE | ORAL | 3 refills | Status: DC

## 2018-07-12 NOTE — Telephone Encounter (Signed)
The patient is having increased discomfort and pain, the gabapentin dose will be increased taking 1 in the morning and 2 in the evening.

## 2018-07-24 DIAGNOSIS — F482 Pseudobulbar affect: Secondary | ICD-10-CM | POA: Diagnosis not present

## 2018-07-24 DIAGNOSIS — F413 Other mixed anxiety disorders: Secondary | ICD-10-CM | POA: Diagnosis not present

## 2018-07-25 DIAGNOSIS — C719 Malignant neoplasm of brain, unspecified: Secondary | ICD-10-CM | POA: Diagnosis not present

## 2018-07-25 DIAGNOSIS — H5022 Vertical strabismus, left eye: Secondary | ICD-10-CM | POA: Diagnosis not present

## 2018-07-25 DIAGNOSIS — H5319 Other subjective visual disturbances: Secondary | ICD-10-CM | POA: Diagnosis not present

## 2018-07-25 DIAGNOSIS — Z923 Personal history of irradiation: Secondary | ICD-10-CM | POA: Diagnosis not present

## 2018-07-31 DIAGNOSIS — F0634 Mood disorder due to known physiological condition with mixed features: Secondary | ICD-10-CM | POA: Diagnosis not present

## 2018-07-31 DIAGNOSIS — F482 Pseudobulbar affect: Secondary | ICD-10-CM | POA: Diagnosis not present

## 2018-07-31 DIAGNOSIS — F064 Anxiety disorder due to known physiological condition: Secondary | ICD-10-CM | POA: Diagnosis not present

## 2018-08-10 ENCOUNTER — Telehealth: Payer: Self-pay | Admitting: Neurology

## 2018-08-10 NOTE — Telephone Encounter (Signed)
  I called Ms. Sarvis, the patient's caretaker, the cardiac monitor is unremarkable, no indication for anticoagulant therapy.  Cardiac monitor study 08/09/18:  Narrative & Impression    Sinus rhythm No arrhythmias No atrial fibrillation No AV block or pauses

## 2018-08-14 NOTE — Telephone Encounter (Signed)
Spoke with pt's aunt, [name]. She sts. they will not have copay for tomorrow's ov until 12/27.  She left a message for Angie in billing to see if copay can be charged. I r/s pt's appt. tomorrow to 09/04/18; Hassan Rowan preferred this to charging copay/fim

## 2018-08-14 NOTE — Telephone Encounter (Signed)
Pt Aunt(on DPR) has called stating that since she has spoken with Dr Jannifer Franklin about results for pt does he still need to come for 6 mo f/u on tomorrow.  Please call

## 2018-08-15 ENCOUNTER — Ambulatory Visit: Payer: Medicare Other | Admitting: Neurology

## 2018-08-21 ENCOUNTER — Telehealth: Payer: Self-pay | Admitting: Cardiology

## 2018-08-21 NOTE — Telephone Encounter (Signed)
Left message for Brenda to return call

## 2018-08-21 NOTE — Telephone Encounter (Signed)
Patient's POA [name] Love, called to let doctor know patient had 2 strokes in September and is under the care of Dr. Jannifer Franklin. He is on Renexa ER and has gained weight since being on this medicine. After December 31, the company helping pay for this medicine is through and he cannot afford it without help. Is there patient assistance for Renexa or should he be taken off since he is gaining weight?

## 2018-08-21 NOTE — Telephone Encounter (Signed)
Patient's aunt per dpr advised that patient can stop Ranexa per Dr. Agustin Cree and if any issues to let us know. Also scheduled follow up appointment for patient. She verbally understands

## 2018-08-23 ENCOUNTER — Other Ambulatory Visit: Payer: Self-pay | Admitting: Neurology

## 2018-09-04 ENCOUNTER — Other Ambulatory Visit: Payer: Self-pay

## 2018-09-04 ENCOUNTER — Other Ambulatory Visit: Payer: Self-pay | Admitting: Neurology

## 2018-09-04 ENCOUNTER — Encounter: Payer: Self-pay | Admitting: Neurology

## 2018-09-04 ENCOUNTER — Ambulatory Visit (INDEPENDENT_AMBULATORY_CARE_PROVIDER_SITE_OTHER): Payer: Medicare Other | Admitting: Neurology

## 2018-09-04 VITALS — BP 131/86 | HR 99 | Resp 20 | Ht 68.75 in | Wt 224.0 lb

## 2018-09-04 DIAGNOSIS — I69398 Other sequelae of cerebral infarction: Secondary | ICD-10-CM

## 2018-09-04 DIAGNOSIS — R569 Unspecified convulsions: Secondary | ICD-10-CM

## 2018-09-04 DIAGNOSIS — R296 Repeated falls: Secondary | ICD-10-CM

## 2018-09-04 DIAGNOSIS — H532 Diplopia: Secondary | ICD-10-CM

## 2018-09-04 DIAGNOSIS — I69359 Hemiplegia and hemiparesis following cerebral infarction affecting unspecified side: Secondary | ICD-10-CM | POA: Diagnosis not present

## 2018-09-04 HISTORY — DX: Hemiplegia and hemiparesis following cerebral infarction affecting unspecified side: I69.398

## 2018-09-04 HISTORY — DX: Hemiplegia and hemiparesis following cerebral infarction affecting unspecified side: I69.359

## 2018-09-04 NOTE — Progress Notes (Signed)
Reason for visit: Seizures, cerebrovascular disease, gait disturbance  [name] Love is an 47 y.o. male  History of present illness:  [name] Love is a 47 year old right-handed white male with a history of an ependymoma resection in the past, the patient has had whole brain radiation, he has suffered a recent stroke event with right-sided weakness and numbness.  The patient has regained a lot of his ability to ambulate, he has fallen yesterday when he was using a cane and not a walker.  The patient does better with a walker.  He has completed physical therapy.  He has not had any further seizures, he remains on Topamax and Lamictal.  The patient has some discomfort on the right side, he takes gabapentin for this.  He is sleeping fairly well but he does have fatigue during the day.  He returns for an evaluation.  Past Medical History:  Diagnosis Date  . Abnormal gait   . Allergic rhinitis   . Anxiety   . Bilateral arm weakness   . Brain cancer ([place])   . Depression   . Ependymoma of brain ([place])   . Hearing loss, sensorineural   . Hemiparesis and alteration of sensations as late effects of stroke ([place]) 09/04/2018  . Hypothyroidism   . Insomnia   . Left leg weakness   . Low vitamin B12 level   . Seizures ([place])     Past Surgical History:  Procedure Laterality Date  . BRAIN SURGERY    . HERNIA REPAIR    . SHUNT REVISION      Family History  Problem Relation Age of Onset  . Brain cancer Mother   . High blood pressure Mother     Social history:  reports that he has never smoked. He has never used smokeless tobacco. He reports that he does not drink alcohol or use drugs.    Allergies  Allergen Reactions  . Baclofen     Urinary retension  . Seroquel [Quetiapine]     Weight gain  . Sulfa Antibiotics   . Coconut Flavor Rash    ANYTHING COCONUT  . Contrast Media [Iodinated Diagnostic Agents] Rash    Medications:  Prior to Admission medications   Medication Sig Start Date End  Date Taking? Authorizing Provider  albuterol (PROVENTIL HFA;VENTOLIN HFA) 108 (90 Base) MCG/ACT inhaler Inhale 1-2 puffs into the lungs every 6 (six) hours as needed for wheezing or shortness of breath. 04/03/18  Yes Rigoberto Noel, MD  ALPRAZolam Duanne Moron) 0.5 MG tablet Take 0.5 mg by mouth 2 (two) times daily. 05/30/18  Yes [provider]  ARIPiprazole (ABILIFY) 10 MG tablet Take 10 mg by mouth daily.   Yes [provider]  aspirin EC 81 MG EC tablet Take 1 tablet (81 mg total) by mouth daily. 06/12/18  Yes Purohit, Konrad Dolores, MD  escitalopram (LEXAPRO) 20 MG tablet Take 20 mg by mouth daily.  12/31/16  Yes [provider]  fluticasone furoate-vilanterol (BREO ELLIPTA) 100-25 MCG/INH AEPB Inhale 1 puff into the lungs daily. 04/10/18  Yes Rigoberto Noel, MD  gabapentin (NEURONTIN) 300 MG capsule One capsule in the morning and 2 in the evening 07/12/18  Yes Kathrynn Ducking, MD  lamoTRIgine (LAMICTAL) 100 MG tablet TAKE 1 TABLET BY MOUTH TWICE DAILY 08/23/18  Yes Kathrynn Ducking, MD  levothyroxine (SYNTHROID, LEVOTHROID) 50 MCG tablet Take 50 mcg by mouth daily before breakfast.  12/25/16  Yes [provider]  NUEDEXTA 20-10 MG CAPS Take  1 capsule by mouth 2 (two) times daily. 12/31/16  Yes [provider]  omega-3 fish oil (MAXEPA) 1000 MG CAPS capsule Take 1 capsule by mouth 2 (two) times daily.   Yes [provider]  omeprazole (PRILOSEC) 20 MG capsule Take 20 mg by mouth daily.   Yes [provider]  polycarbophil (FIBERCON) 625 MG tablet Take 2 tablets (1,250 mg total) by mouth at bedtime. 06/11/18  Yes Purohit, Konrad Dolores, MD  topiramate (TOPAMAX) 50 MG tablet TAKE 1 TABLET BY MOUTH IN THE MORNING AND 2 TABLETS  IN THE EVENING 09/04/18  Yes Kathrynn Ducking, MD  rosuvastatin (CRESTOR) 20 MG tablet Take 1 tablet (20 mg total) by mouth daily. 06/11/18 07/11/18  Purohit, Konrad Dolores, MD    ROS:  Out of a complete 14 system review of symptoms, the patient  complains only of the following symptoms, and all other reviewed systems are negative.  Fatigue Hearing loss Wheezing Excessive thirst, excessive eating Sleep talking Joint pain Dizziness, numbness, seizures Agitation, depression  Blood pressure 131/86, pulse 99, resp. rate 20, height 5' 8.75" (1.746 m), weight 224 lb (101.6 kg).  Physical Exam  General: The patient is alert and cooperative at the time of the examination.  The patient is moderately obese.  Skin: No significant peripheral edema is noted.   Neurologic Exam  Mental status: The patient is alert and oriented x 3 at the time of the examination. The patient has apparent normal recent and remote memory, with an apparently normal attention span and concentration ability.   Cranial nerves: Facial symmetry is present. Speech is normal, no aphasia or dysarthria is noted. Extraocular movements are full. Visual fields are full.  Motor: The patient has good strength in all 4 extremities.  Sensory examination: Soft touch sensation is decreased on the right face, arm, and leg.  Coordination: The patient has good finger-nose-finger and heel-to-shin bilaterally.  Gait and station: The patient has a slightly wide-based gait, the patient walk independently.  Romberg is negative.  Reflexes: Deep tendon reflexes are symmetric.   Assessment/Plan:  1.  History of seizures  2.  Recent stroke event, right-sided symptoms  3.  Gait disorder  The patient is doing better with his underlying discomfort on the gabapentin.  We will not readjust the dose.  The patient will remain on Lamictal and Topamax, he will follow-up in 6 months.  The patient apparently is operating a motor vehicle.  Jill Alexanders MD 09/04/2018 12:27 PM  [facility] [address] [place], [place] [address]  Phone [phone] Fax [phone]

## 2018-09-07 DIAGNOSIS — F0634 Mood disorder due to known physiological condition with mixed features: Secondary | ICD-10-CM | POA: Diagnosis not present

## 2018-09-07 DIAGNOSIS — F064 Anxiety disorder due to known physiological condition: Secondary | ICD-10-CM | POA: Diagnosis not present

## 2018-09-07 DIAGNOSIS — F482 Pseudobulbar affect: Secondary | ICD-10-CM | POA: Diagnosis not present

## 2018-09-15 ENCOUNTER — Telehealth: Payer: Self-pay | Admitting: Neurology

## 2018-09-15 MED ORDER — HYDROCODONE-ACETAMINOPHEN 5-325 MG PO TABS: 1.0000 | ORAL_TABLET | Freq: Four times a day (QID) | ORAL | 0 refills | Status: DC | PRN

## 2018-09-15 NOTE — Telephone Encounter (Signed)
The hydrocodone prescription will be refilled. 

## 2018-09-15 NOTE — Telephone Encounter (Signed)
It appears that pt's hydrocodone RX was discontinued at discharge from the hospital in September.   [place] [facility] checked, last fill was in September for 90 tablets, ordered by Dr. Jannifer Franklin.

## 2018-09-15 NOTE — Telephone Encounter (Signed)
Pts aunt Hassan Rowan (on dpr) called requesting refills for HYDROcodone-acetaminophen (NORCO/VICODIN) 5-325 MG tablet sent to [facility].

## 2018-09-19 ENCOUNTER — Ambulatory Visit: Payer: Self-pay | Admitting: Cardiology

## 2018-09-20 DIAGNOSIS — H532 Diplopia: Secondary | ICD-10-CM | POA: Diagnosis not present

## 2018-09-20 DIAGNOSIS — C719 Malignant neoplasm of brain, unspecified: Secondary | ICD-10-CM | POA: Diagnosis not present

## 2018-10-18 ENCOUNTER — Ambulatory Visit: Payer: Self-pay | Admitting: Cardiology

## 2018-10-27 DIAGNOSIS — Z0100 Encounter for examination of eyes and vision without abnormal findings: Secondary | ICD-10-CM | POA: Diagnosis not present

## 2018-10-31 ENCOUNTER — Ambulatory Visit: Payer: Self-pay | Admitting: Cardiology

## 2018-11-02 ENCOUNTER — Encounter: Payer: Self-pay | Admitting: Cardiology

## 2018-11-02 ENCOUNTER — Ambulatory Visit (INDEPENDENT_AMBULATORY_CARE_PROVIDER_SITE_OTHER): Payer: Medicare HMO | Admitting: Cardiology

## 2018-11-02 VITALS — BP 112/64 | HR 90 | Ht 66.0 in | Wt 235.0 lb

## 2018-11-02 DIAGNOSIS — R0789 Other chest pain: Secondary | ICD-10-CM

## 2018-11-02 DIAGNOSIS — R0609 Other forms of dyspnea: Secondary | ICD-10-CM | POA: Diagnosis not present

## 2018-11-02 DIAGNOSIS — R569 Unspecified convulsions: Secondary | ICD-10-CM | POA: Diagnosis not present

## 2018-11-02 DIAGNOSIS — R296 Repeated falls: Secondary | ICD-10-CM

## 2018-11-02 NOTE — Patient Instructions (Signed)
Medication Instructions:  Your physician has recommended you make the following change in your medication:   STOP ranolazine (ranexa)  **Please contact our office if chest pain reoccurs or worsens.  If you need a refill on your cardiac medications before your next appointment, please call your pharmacy.   Lab work: None  If you have labs (blood work) drawn today and your tests are completely normal, you will receive your results only by: Marland Kitchen MyChart Message (if you have MyChart) OR . A paper copy in the mail If you have any lab test that is abnormal or we need to change your treatment, we will call you to review the results.  Testing/Procedures: None  Follow-Up: At [facility], you and your health needs are our priority.  As part of our continuing mission to provide you with exceptional heart care, we have created designated Provider Care Teams.  These Care Teams include your primary Cardiologist (physician) and Advanced Practice Providers (APPs -  Physician Assistants and Nurse Practitioners) who all work together to provide you with the care you need, when you need it. You will need a follow up appointment in 6 months.  Please call our office 2 months in advance to schedule this appointment.

## 2018-11-02 NOTE — Progress Notes (Signed)
Cardiology Office Note:    Date:  11/02/2018   ID:  [name] Love, DOB Apr 30, 1971, MRN [id number]  PCP:  Isaias Sakai, DO  Cardiologist:  Jenne Campus, MD    Referring MD: Alonna Buckler*   Chief Complaint  Patient presents with  . Follow-up  Doing well cardiac wise  History of Present Illness:    [name] Love is a 48 y.o. male with history of brain cancer status post multiple surgery recurrent seizures recently in September admitted to the hospital because of thalamic stroke.  Quite extensive evaluation has been done has been follow-up by neurology.  I have seen him initially because of exertional shortness of breath as well as atypical chest pain.  Surprisingly ranolazine seems to be helping quite significantly.  However today he complained about the price of the medication he want to get off it.  He exercised on the regular basis he goes on the treadmill for 15 to 20 minutes every single day at speed 1.5 mph he said he does not have any issue when on the treadmill otherwise complain of having shortness of breath chest pain is very atypical in today honestly looks more like a GI etiology.  He said he eats certain kind of food and we will get it.  He does take some proton pump inhibitor with release.  Past Medical History:  Diagnosis Date  . Abnormal gait   . Allergic rhinitis   . Anxiety   . Bilateral arm weakness   . Brain cancer ([place])   . Depression   . 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 Hearing loss, sensorineural   . Hemiparesis and alteration of sensations as late effects of stroke ([place]) 09/04/2018  . Hypothyroidism   . Insomnia   . Left leg weakness   . Low vitamin B12 level   . Seizures ([place])     Past Surgical History:  Procedure Laterality Date  . BRAIN SURGERY    . HERNIA REPAIR    . SHUNT REVISION      Current Medications: Current Meds  Medication Sig  . albuterol (PROVENTIL HFA;VENTOLIN HFA) 108 (90 Base) MCG/ACT inhaler Inhale 1-2 puffs into  the lungs every 6 (six) hours as needed for wheezing or shortness of breath.  . ALPRAZolam (XANAX) 0.5 MG tablet Take 0.5 mg by mouth 2 (two) times daily.  . ARIPiprazole (ABILIFY) 10 MG tablet Take 10 mg by mouth daily.  Marland Kitchen aspirin EC 81 MG EC tablet Take 1 tablet (81 mg total) by mouth daily.  Marland Kitchen escitalopram (LEXAPRO) 20 MG tablet Take 20 mg by mouth daily.   Marland Kitchen gabapentin (NEURONTIN) 300 MG capsule One capsule in the morning and 2 in the evening  . HYDROcodone-acetaminophen (NORCO/VICODIN) 5-325 MG tablet Take 1 tablet by mouth every 6 (six) hours as needed for moderate pain.  Marland Kitchen lamoTRIgine (LAMICTAL) 100 MG tablet TAKE 1 TABLET BY MOUTH TWICE DAILY  . levothyroxine (SYNTHROID, LEVOTHROID) 50 MCG tablet Take 50 mcg by mouth daily before breakfast.   . NUEDEXTA 20-10 MG CAPS Take 1 capsule by mouth 2 (two) times daily.  Marland Kitchen omega-3 fish oil (MAXEPA) 1000 MG CAPS capsule Take 1 capsule by mouth 2 (two) times daily.  Marland Kitchen omeprazole (PRILOSEC) 20 MG capsule Take 20 mg by mouth daily.  . polycarbophil (FIBERCON) 625 MG tablet Take 2 tablets (1,250 mg total) by mouth at bedtime.  . topiramate (TOPAMAX) 50 MG tablet TAKE 1 TABLET BY MOUTH IN THE MORNING AND 2 TABLETS  IN THE EVENING     Allergies:   Baclofen; Seroquel [quetiapine]; Sulfa antibiotics; Coconut flavor; and Contrast media [iodinated diagnostic agents]   Social History   Socioeconomic History  . Marital status: Single    Spouse name: Not on file  . Number of children: 0  . Years of education: 58  . Highest education level: Not on file  Occupational History  . Occupation: Disabled  Social Needs  . Financial resource strain: Not hard at all  . Food insecurity:    Worry: Never true    Inability: Never true  . Transportation needs:    Medical: No    Non-medical: No  Tobacco Use  . Smoking status: Never Smoker  . Smokeless tobacco: Never Used  Substance and Sexual Activity  . Alcohol use: No  . Drug use: No  . Sexual activity:  Not on file  Lifestyle  . Physical activity:    Days per week: Patient refused    Minutes per session: Patient refused  . Stress: Not at all  Relationships  . Social connections:    Talks on phone: Twice a week    Gets together: Twice a week    Attends religious service: Never    Active member of club or organization: No    Attends meetings of clubs or organizations: Never    Relationship status: Not on file  Other Topics Concern  . Not on file  Social History Narrative   Lives with Aunt [name])   Caffeine use: No coffee   Soda- trying to quit      Right-handed     Family History: The patient's family history includes Brain cancer in his mother; High blood pressure in his mother. ROS:   Please see the history of present illness.    All 14 point review of systems negative except as described per history of present illness  EKGs/Labs/Other Studies Reviewed:      Recent Labs: 02/02/2018: TSH 2.190 06/07/2018: ALT 17 06/09/2018: BUN 11; Creatinine, Ser 1.40; Hemoglobin 14.6; Magnesium 2.3; Platelets 217; Potassium 3.6; Sodium 141  Recent Lipid Panel    Component Value Date/Time   CHOL 151 06/08/2018 0428   TRIG 277 (H) 06/08/2018 0428   HDL 27 (L) 06/08/2018 0428   CHOLHDL 5.6 06/08/2018 0428   VLDL 55 (H) 06/08/2018 0428   LDLCALC 69 06/08/2018 0428    Physical Exam:    VS:  BP 112/64   Pulse 90   Ht 5\' 6"  (1.676 m)   Wt 235 lb (106.6 kg)   SpO2 97%   BMI 37.93 kg/m     Wt Readings from Last 3 Encounters:  11/02/18 235 lb (106.6 kg)  09/04/18 224 lb (101.6 kg)  06/23/18 224 lb (101.6 kg)     GEN:  Well nourished, well developed in no acute distress HEENT: Normal NECK: No JVD; No carotid bruits LYMPHATICS: No lymphadenopathy CARDIAC: RRR, no murmurs, no rubs, no gallops RESPIRATORY:  Clear to auscultation without rales, wheezing or rhonchi  ABDOMEN: Soft, non-tender, non-distended MUSCULOSKELETAL:  No edema; No deformity  SKIN: Warm and  dry LOWER EXTREMITIES: no swelling NEUROLOGIC:  Alert and oriented x 3 PSYCHIATRIC:  Normal affect   ASSESSMENT:    1. Atypical chest pain   2. Dyspnea on exertion   3. Seizure ([place])   4. Recurrent falls    PLAN:    In order of problems listed above:  1. Atypical chest pain.  He want to stop ranolazine I  told him to try to do to to see if he feels any worse if that is the case we will continue with medications.  Because of comorbidity we favor a conservative approach 2. Dyspnea on exertion echocardiogram recently done in September in the hospital showed preserved left ventricular ejection fraction.  I encouraged him to continue exercises on the treadmill. 3. Seizures followed by internal medicine team as well as neurology 4. History of brain cancer status post surgery.  Followed by oncology/neurology team.   Medication Adjustments/Labs and Tests Ordered: Current medicines are reviewed at length with the patient today.  Concerns regarding medicines are outlined above.  No orders of the defined types were placed in this encounter.  Medication changes: No orders of the defined types were placed in this encounter.   Signed, Park Liter, MD, [facility] 11/02/2018 8:43 AM    [place]

## 2018-11-28 ENCOUNTER — Ambulatory Visit (INDEPENDENT_AMBULATORY_CARE_PROVIDER_SITE_OTHER): Payer: Medicare HMO | Admitting: Pulmonary Disease

## 2018-11-28 ENCOUNTER — Encounter: Payer: Self-pay | Admitting: Pulmonary Disease

## 2018-11-28 DIAGNOSIS — R0609 Other forms of dyspnea: Secondary | ICD-10-CM

## 2018-11-28 DIAGNOSIS — F411 Generalized anxiety disorder: Secondary | ICD-10-CM

## 2018-11-28 DIAGNOSIS — G4733 Obstructive sleep apnea (adult) (pediatric): Secondary | ICD-10-CM

## 2018-11-28 DIAGNOSIS — R69 Illness, unspecified: Secondary | ICD-10-CM | POA: Diagnosis not present

## 2018-11-28 HISTORY — DX: Obstructive sleep apnea (adult) (pediatric): G47.33

## 2018-11-28 HISTORY — DX: Generalized anxiety disorder: F41.1

## 2018-11-28 NOTE — Assessment & Plan Note (Signed)
He has an appointment on March 5 to discuss with a psychiatrist. He has self stopped his medications. Feel that anxiety and panic attacks are his main issue rather than cardiopulmonary problems

## 2018-11-28 NOTE — Patient Instructions (Signed)
Ambulatory saturation.  Schedule home sleep test to see if you will need CPAP machine during sleep.  Sample of Spiriva to take once daily when you get short of breath -I do not think you need this long-term. Discussed with your psychiatrist about increasing medications for panic attacks

## 2018-11-28 NOTE — Assessment & Plan Note (Signed)
Given excessive daytime somnolence, narrow pharyngeal exam, witnessed apneas & loud snoring, obstructive sleep apnea is very likely & an overnight polysomnogram will be scheduled as a home study. The pathophysiology of obstructive sleep apnea , it's cardiovascular consequences & modes of treatment including CPAP were discused with the patient in detail & they evidenced understanding.  Pretest probability is intermediate  

## 2018-11-28 NOTE — Assessment & Plan Note (Signed)
I am not convinced that he has a definite pulmonary problem, doubt asthma, no evidence of ILD or pulmonary hypertension.  His main issue may be severe anxiety and panic attacks Ambulatory saturation.  Schedule home sleep test to see if you will need CPAP machine during sleep.  Sample of Spiriva to take once daily when you get short of breath -I do not think you need this long-term. Discussed with your psychiatrist about increasing medications for panic attacks

## 2018-11-28 NOTE — Progress Notes (Signed)
   Subjective:    Patient ID: [name] Love, male    DOB: Jun 06, 1971, 48 y.o.   MRN: [id number]  HPI  48 year old for follow-up of dyspnea on exertion  PMH significant for ependymoma diagnosed in 1995 and since then he has had multiple brain surgeries and whole brain radiation multiple times with also radiation to his spine. He has been left with deafness, gait imbalance, memory issues and poor dentition. He also developed hallucinations and has been seeing psychiatry.He also has a VP shunt, seizure disorder and chronic whole body pain. He ambulates with a cane. He is disabled and lives with his dad.  Initial visit 01/2018, we have not convinced that he has asthma.  Albuterol did not help, Symbicort did not help.  He could not afford Breo or other medications. He has significant anxiety and has self discontinued his medications including Abilify.  He has an upcoming appointment with a psychiatrist 3/5.  He wishes he could get more Xanax but his PCP is limited this 2.5 twice a day, he is to be on much higher doses before.  He has been evaluated by cardiology.  He reports episodic shortness of breath, his aunt gave him a sample of Spiriva and this seemed to help him a little bit for about 1 to 2 hours  Mild snoring has been noted by family members.  He denies excessive daytime somnolence but does report fatigue Epworth sleepiness score is 10 and he reports sleepiness while sitting inactive in a public place or as a passenger in a car  Significant tests/ events reviewed  Ambulatory saturation-O2 saturation remained at 95% on walking 2 laps heart rate remained between 95-100  Spirometry 01/2018 ratio 76, FEV1 of 69% and FVC of 72% suggesting mild restriction 04/2018 FENO 19     Past Medical History:  Diagnosis Date  . Abnormal gait   . Allergic rhinitis   . Anxiety   . Bilateral arm weakness   . Brain cancer ([place])   . Depression   . 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 Hearing loss,  sensorineural   . Hemiparesis and alteration of sensations as late effects of stroke ([place]) 09/04/2018  . Hypothyroidism   . Insomnia   . Left leg weakness   . Low vitamin B12 level   . Seizures ([place])     Review of Systems neg for any significant sore throat, dysphagia, itching, sneezing, nasal congestion or excess/ purulent secretions, fever, chills, sweats, unintended wt loss, pleuritic or exertional cp, hempoptysis, orthopnea pnd or change in chronic leg swelling. Also denies presyncope, palpitations, heartburn, abdominal pain, nausea, vomiting, diarrhea or change in bowel or urinary habits, dysuria,hematuria, rash, arthralgias, visual complaints, headache, numbness weakness or ataxia.     Objective:   Physical Exam   Gen. Pleasant, obese, in no distress ENT - no lesions, no post nasal drip Neck: No JVD, no thyromegaly, no carotid bruits Lungs: no use of accessory muscles, no dullness to percussion, decreased without rales or rhonchi  Cardiovascular: Rhythm regular, heart sounds  normal, no murmurs or gallops, no peripheral edema Musculoskeletal: No deformities, no cyanosis or clubbing , no tremors        Assessment & Plan:

## 2018-12-12 DIAGNOSIS — G4733 Obstructive sleep apnea (adult) (pediatric): Secondary | ICD-10-CM | POA: Diagnosis not present

## 2018-12-12 DIAGNOSIS — R0609 Other forms of dyspnea: Secondary | ICD-10-CM

## 2018-12-15 ENCOUNTER — Telehealth: Payer: Self-pay | Admitting: Pulmonary Disease

## 2018-12-15 DIAGNOSIS — G4733 Obstructive sleep apnea (adult) (pediatric): Secondary | ICD-10-CM

## 2018-12-15 NOTE — Telephone Encounter (Signed)
Per RA, HST showed moderate OSA with 20 events per hour.   Recommends CPAP titration study as the next step.

## 2018-12-18 NOTE — Telephone Encounter (Signed)
Called and spoke with patient, he is aware and verbalized understanding. Order has been placed.

## 2018-12-18 NOTE — Telephone Encounter (Signed)
Pt is returning call. Cb is [phone].

## 2018-12-18 NOTE — Telephone Encounter (Signed)
Attempted to call pt but unable to reach. Left message for pt to return call. 

## 2019-01-04 ENCOUNTER — Encounter (HOSPITAL_BASED_OUTPATIENT_CLINIC_OR_DEPARTMENT_OTHER): Payer: Self-pay

## 2019-03-05 ENCOUNTER — Other Ambulatory Visit: Payer: Self-pay

## 2019-03-05 ENCOUNTER — Other Ambulatory Visit (HOSPITAL_COMMUNITY)
Admission: RE | Admit: 2019-03-05 | Discharge: 2019-03-05 | Disposition: A | Discharge status: Home / Self Care | Payer: Medicare HMO | Source: Ambulatory Visit | Attending: Pulmonary Disease | Admitting: Pulmonary Disease

## 2019-03-05 DIAGNOSIS — Z1159 Encounter for screening for other viral diseases: Secondary | ICD-10-CM | POA: Diagnosis not present

## 2019-03-07 LAB — NOVEL CORONAVIRUS, NAA (HOSP ORDER, SEND-OUT TO REF LAB; TAT 18-24 HRS): SARS-CoV-2, NAA: NOT DETECTED

## 2019-03-08 ENCOUNTER — Other Ambulatory Visit: Payer: Self-pay

## 2019-03-08 ENCOUNTER — Ambulatory Visit (HOSPITAL_BASED_OUTPATIENT_CLINIC_OR_DEPARTMENT_OTHER): Payer: Medicare HMO | Attending: Pulmonary Disease | Admitting: Pulmonary Disease

## 2019-03-08 VITALS — Ht 66.0 in | Wt 207.0 lb

## 2019-03-08 DIAGNOSIS — Z79899 Other long term (current) drug therapy: Secondary | ICD-10-CM | POA: Diagnosis not present

## 2019-03-08 DIAGNOSIS — G4733 Obstructive sleep apnea (adult) (pediatric): Secondary | ICD-10-CM | POA: Insufficient documentation

## 2019-03-13 ENCOUNTER — Telehealth: Payer: Self-pay | Admitting: Pulmonary Disease

## 2019-03-13 DIAGNOSIS — G4733 Obstructive sleep apnea (adult) (pediatric): Secondary | ICD-10-CM | POA: Diagnosis not present

## 2019-03-13 NOTE — Telephone Encounter (Signed)
Spoke with the pt and notified of recs per Dr Elsworth Soho  He verbalized understanding  Order sent for [facility]  Appt was scheduled

## 2019-03-13 NOTE — Telephone Encounter (Signed)
Please send Rx for CPAP 7 cm H2O with a Medium size Resmed Full Face Mask AirFit F20 mask and heated humidification  Office visit with APP in 6 weeks

## 2019-03-13 NOTE — Procedures (Signed)
Patient Name: Yoltzin, Barg Date: 03/08/2019 Gender: Male D.O.B: [birth year] Age (years): 81 Referring Provider: Kara Mead MD, ABSM Height (inches): 66 Interpreting Physician: Kara Mead MD, ABSM Weight (lbs): 207 RPSGT: Jorge Ny BMI: 33 MRN: [id number] Neck Size: 14.00 <br> <br> CLINICAL INFORMATION The patient is referred for a CPAP titration to treat sleep apnea.    Date of HST: 12/2018 , AHI 20/h  SLEEP STUDY TECHNIQUE As per the AASM Manual for the Scoring of Sleep and Associated Events v2.3 (April 2016) with a hypopnea requiring 4% desaturations.  The channels recorded and monitored were frontal, central and occipital EEG, electrooculogram (EOG), submentalis EMG (chin), nasal and oral airflow, thoracic and abdominal wall motion, anterior tibialis EMG, snore microphone, electrocardiogram, and pulse oximetry. Continuous positive airway pressure (CPAP) was initiated at the beginning of the study and titrated to treat sleep-disordered breathing.  MEDICATIONS Medications self-administered by patient taken the night of the study : ALPRAZOLAM, TOPIRAMATE  TECHNICIAN COMMENTS Comments added by technician: Patient had difficulty initiating sleep.  RESPIRATORY PARAMETERS Optimal PAP Pressure (cm): 7 AHI at Optimal Pressure (/hr): 0.0 Overall Minimal O2 (%): 93.00 Supine % at Optimal Pressure (%): 74 Minimal O2 at Optimal Pressure (%): 94.0   SLEEP ARCHITECTURE The study was initiated at 10:45:20 PM and ended at 5:08:18 AM.  Sleep onset time was 55.9 minutes and the sleep efficiency was 68.7%. The total sleep time was 263 minutes.  The patient spent 22.05% of the night in stage N1 sleep, 60.27% in stage N2 sleep, 0.00% in stage N3 and 17.7% in REM.Stage REM latency was 88.5 minutes  Wake after sleep onset was 64.1. Alpha intrusion was absent. Supine sleep was 77.95%.  CARDIAC DATA The 2 lead EKG demonstrated sinus rhythm. The mean heart rate was 69.85 beats per  minute. Other EKG findings include: PVCs.   LEG MOVEMENT DATA The total  Limb Movements of Sleep  were 183 with index of 41.75. PLMs were not noted.A PLMS index of <15 is considered normal in adults.  IMPRESSIONS - The optimal PAP pressure was 7 cm of water. - Central sleep apnea was not noted during this titration (CAI = 0.0/h). - Significant oxygen desaturations were not observed during this titration (min O2 = 93.00%). - The patient snored with soft snoring volume during this titration study. - 2-lead EKG demonstrated: PVCs - Moderate limb movements were observed during this study. Arousals associated with these were significant.   DIAGNOSIS - Obstructive Sleep Apnea (327.23 [G47.33 ICD-10])   RECOMMENDATIONS - Trial of CPAP therapy on 7 cm H2O with a Medium size Resmed Full Face Mask AirFit F20 mask and heated humidification. - Clinical evaluation for restless legs syndrome - Avoid alcohol, sedatives and other CNS depressants that may worsen sleep apnea and disrupt normal sleep architecture. - Sleep hygiene should be reviewed to assess factors that may improve sleep quality. - Weight management and regular exercise should be initiated or continued. - Return to [facility] for re-evaluation after 4 weeks of therapy   Kara Mead MD Board Certified in [place]

## 2019-03-15 ENCOUNTER — Other Ambulatory Visit: Payer: Self-pay | Admitting: General Surgery

## 2019-03-15 DIAGNOSIS — G4733 Obstructive sleep apnea (adult) (pediatric): Secondary | ICD-10-CM

## 2019-03-21 ENCOUNTER — Telehealth: Payer: Self-pay | Admitting: Pulmonary Disease

## 2019-03-21 NOTE — Telephone Encounter (Signed)
Returned call and made pt aware [facility] will contact him after insurance approval Pt verbalized understanding Nothing further needed.

## 2019-03-21 NOTE — Telephone Encounter (Signed)
LMTCB x1   DME order placed 03/15/19 for new cpap.

## 2019-03-21 NOTE — Telephone Encounter (Signed)
Pt is calling back [phone]

## 2019-03-22 DIAGNOSIS — G4733 Obstructive sleep apnea (adult) (pediatric): Secondary | ICD-10-CM | POA: Diagnosis not present

## 2019-03-26 ENCOUNTER — Telehealth: Payer: Self-pay | Admitting: Neurology

## 2019-03-26 NOTE — Telephone Encounter (Signed)
Due to current COVID 19 pandemic, our office is severely reducing in office visits until further notice, in order to minimize the risk to our patients and healthcare providers.   Called patient regarding 6/24 appt. Patient will come in office as he is hard of hearing.

## 2019-03-28 ENCOUNTER — Encounter: Payer: Self-pay | Admitting: Neurology

## 2019-03-28 ENCOUNTER — Ambulatory Visit (INDEPENDENT_AMBULATORY_CARE_PROVIDER_SITE_OTHER): Payer: Medicare HMO | Admitting: Neurology

## 2019-03-28 ENCOUNTER — Other Ambulatory Visit: Payer: Self-pay

## 2019-03-28 VITALS — Temp 98.2°F | Ht 66.0 in | Wt 206.3 lb

## 2019-03-28 DIAGNOSIS — I69398 Other sequelae of cerebral infarction: Secondary | ICD-10-CM | POA: Diagnosis not present

## 2019-03-28 DIAGNOSIS — R569 Unspecified convulsions: Secondary | ICD-10-CM | POA: Diagnosis not present

## 2019-03-28 DIAGNOSIS — I69359 Hemiplegia and hemiparesis following cerebral infarction affecting unspecified side: Secondary | ICD-10-CM | POA: Diagnosis not present

## 2019-03-28 MED ORDER — TOPIRAMATE 50 MG PO TABS: ORAL_TABLET | ORAL | 3 refills | Status: DC

## 2019-03-28 NOTE — Progress Notes (Signed)
Reason for visit: Seizures  [name] Love is an 48 y.o. male  History of present illness:  [name] Love is a 48 year old right-handed white male with a history of an ependymoma resection in the past with whole brain radiation.  The patient has had a stroke event with right-sided weakness and numbness, he has a history of seizures.  He has been able to come off of gabapentin as his pain level has improved.  The patient has some ongoing issues with balance, he no longer requires a cane or a walker.  He is back to operate a motor vehicle.  The patient has at some point stopped the Lamictal but he has done well with seizures off Lamictal, he remains on Topamax.  The patient remains on medications for his anxiety issues including Abilify and alprazolam.  He remains on low-dose aspirin.  The patient overall feels well, he has been able to lose some weight, is now on CPAP for his sleep apnea.  He has a better energy level.  Past Medical History:  Diagnosis Date   Abnormal gait    Allergic rhinitis    Anxiety    Bilateral arm weakness    Brain cancer ([facility])    Depression    Ependymoma of brain ([facility])    Hearing loss, sensorineural    Hemiparesis and alteration of sensations as late effects of stroke ([place]) 09/04/2018   Hypothyroidism    Insomnia    Left leg weakness    Low vitamin B12 level    Seizures ([facility])     Past Surgical History:  Procedure Laterality Date   BRAIN SURGERY     HERNIA REPAIR     SHUNT REVISION      Family History  Problem Relation Age of Onset   Brain cancer Mother    High blood pressure Mother     Social history:  reports that he has never smoked. He has never used smokeless tobacco. He reports that he does not drink alcohol or use drugs.    Allergies  Allergen Reactions   Baclofen     Urinary retension   Seroquel [Quetiapine]     Weight gain   Sulfa Antibiotics    Coconut Flavor Rash    ANYTHING COCONUT   Contrast Media [Iodinated  Diagnostic Agents] Rash    Medications:  Prior to Admission medications   Medication Sig Start Date End Date Taking? Authorizing Provider  ALPRAZolam [name] Love) 0.5 MG tablet Take 0.5 mg by mouth 2 (two) times daily. 05/30/18  Yes [provider]  topiramate (TOPAMAX) 50 MG tablet TAKE 1 TABLET BY MOUTH IN THE MORNING AND 2 TABLETS  IN THE EVENING 09/04/18  Yes [name] Ducking, MD  albuterol (PROVENTIL HFA;VENTOLIN HFA) 108 (90 Base) MCG/ACT inhaler Inhale 1-2 puffs into the lungs every 6 (six) hours as needed for wheezing or shortness of breath. Patient not taking: Reported on 11/28/2018 04/03/18   [name] Noel, MD  ARIPiprazole (ABILIFY) 10 MG tablet Take 10 mg by mouth daily.    [provider]  aspirin EC 81 MG EC tablet Take 1 tablet (81 mg total) by mouth daily. Patient not taking: Reported on 03/28/2019 06/12/18   Purohit, [name] Dolores, MD  escitalopram (LEXAPRO) 20 MG tablet Take 20 mg by mouth daily.  12/31/16   [provider]  gabapentin (NEURONTIN) 300 MG capsule One capsule in the morning and 2 in the evening Patient not taking: Reported on 03/28/2019 07/12/18   [name] Love  K, MD  lamoTRIgine (LAMICTAL) 100 MG tablet TAKE 1 TABLET BY MOUTH TWICE DAILY Patient not taking: Reported on 11/28/2018 08/23/18   [name] Ducking, MD  levothyroxine (SYNTHROID, LEVOTHROID) 50 MCG tablet Take 50 mcg by mouth daily before breakfast.  12/25/16   [provider]  NUEDEXTA 20-10 MG CAPS Take 1 capsule by mouth 2 (two) times daily. 12/31/16   [provider]  omega-3 fish oil (MAXEPA) 1000 MG CAPS capsule Take 1 capsule by mouth 2 (two) times daily.    [provider]  polycarbophil (FIBERCON) 625 MG tablet Take 2 tablets (1,250 mg total) by mouth at bedtime. Patient not taking: Reported on 03/28/2019 06/11/18   Purohit, [name] Dolores, MD    ROS:  Out of a complete 14 system review of symptoms, the patient complains only of the following symptoms, and all other  reviewed systems are negative.  Walking problems Sleep apnea  Temperature 98.2 F (36.8 C), temperature source Temporal, height 5\' 6"  (1.676 m), weight 206 lb 5 oz (93.6 kg).  Physical Exam  General: The patient is alert and cooperative at the time of the examination.  Patient is moderately obese.  Skin: No significant peripheral edema is noted.   Neurologic Exam  Mental status: The patient is alert and oriented x 3 at the time of the examination. The patient has apparent normal recent and remote memory, with an apparently normal attention span and concentration ability.   Cranial nerves: Facial symmetry is present. Speech is normal, no aphasia or dysarthria is noted. Extraocular movements are full. Visual fields are full.  The patient is hard of hearing.  Bilateral hearing aids are noted.  Motor: The patient has good strength in all 4 extremities.  Sensory examination: Soft touch sensation is symmetric on the face, arms, and legs.  Coordination: The patient has good finger-nose-finger and heel-to-shin bilaterally.  Gait and station: The patient has a slightly wide-based, slightly unsteady gait. Tandem gait is unsteady. Romberg is negative, but is unsteady. No drift is seen.   Reflexes: Deep tendon reflexes are symmetric.   Assessment/Plan:  1.  History of ependymoma resection, whole brain radiation  2.  Cerebrovascular disease, mild right-sided weakness, gait instability  3.  History of seizures, well controlled  4.  Anxiety  5.  Sleep apnea on CPAP  The patient has been able to come off quite a bit of his medication.  He will continue the Topamax.  A prescription was sent in.  He does operate a Teacher, music.  He will follow-up in 6 months.  He is now being treated for sleep apnea.  [name] Alexanders MD 03/28/2019 12:26 PM  [facility] [address] [place], [place] [address]  Phone [phone] Fax [phone]

## 2019-04-10 ENCOUNTER — Ambulatory Visit (INDEPENDENT_AMBULATORY_CARE_PROVIDER_SITE_OTHER): Payer: Medicare HMO | Admitting: Cardiology

## 2019-04-10 ENCOUNTER — Encounter: Payer: Self-pay | Admitting: Cardiology

## 2019-04-10 ENCOUNTER — Other Ambulatory Visit: Payer: Self-pay

## 2019-04-10 VITALS — BP 120/70 | HR 79 | Wt 204.4 lb

## 2019-04-10 DIAGNOSIS — R0609 Other forms of dyspnea: Secondary | ICD-10-CM | POA: Diagnosis not present

## 2019-04-10 DIAGNOSIS — R0789 Other chest pain: Secondary | ICD-10-CM

## 2019-04-10 DIAGNOSIS — G4733 Obstructive sleep apnea (adult) (pediatric): Secondary | ICD-10-CM

## 2019-04-10 DIAGNOSIS — Z9989 Dependence on other enabling machines and devices: Secondary | ICD-10-CM | POA: Diagnosis not present

## 2019-04-10 NOTE — Progress Notes (Signed)
Cardiology Office Note:    Date:  04/10/2019   ID:  [name] Love, DOB 27-Feb-1971, MRN [id number]  PCP:  Isaias Sakai, DO  Cardiologist:  Jenne Campus, MD  Electrophysiologist:  None   Referring MD: Alonna Buckler*   Chief Complaint: 48 yo male presents for 6 month follow up of cardiac conditions including atypical chest pain and DOE.  History of Present Illness:    [name] Love is a 48 y.o. male with a cardiac hx of atypical chest pain, dyspnea on exertion, OSA on CPAP last seen by me 11/02/2018 presents today for 58-month follow-up of cardiac conditions.  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  At his last office visit here his Ranexa was stopped at his request due to cost.  He seems to be doing well.  Communication is somewhat difficult because we have to wear mask and he does have difficulty hearing he normally rates lips.  Overall seems to be doing well no chest pain tightness squeezing pressure burning chest.  He is laughing a lot during our visit.  Past Medical History:  Diagnosis Date  . Abnormal gait   . Allergic rhinitis   . Anxiety   . Bilateral arm weakness   . Brain cancer ([place])   . Depression   . 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 Hearing loss, sensorineural   . Hemiparesis and alteration of sensations as late effects of stroke ([place]) 09/04/2018  . Hypothyroidism   . Insomnia   . Left leg weakness   . Low vitamin B12 level   . Seizures ([employer])     Past Surgical History:  Procedure Laterality Date  . BRAIN SURGERY    . HERNIA REPAIR    . SHUNT REVISION      Current Medications: Current Meds  Medication Sig  . ALPRAZolam (XANAX) 0.5 MG tablet Take 0.5 mg by mouth 2 (two) times daily.  Marland Kitchen aspirin EC 81 MG EC tablet Take 1 tablet (81 mg total) by mouth daily.  Marland Kitchen topiramate (TOPAMAX) 50 MG tablet TAKE 1 TABLET BY MOUTH IN THE MORNING AND 2 TABLETS   IN THE EVENING     Allergies:   Baclofen, Seroquel [quetiapine], Sulfa antibiotics, Coconut flavor, and Contrast media [iodinated diagnostic agents]   Social History   Socioeconomic History  . Marital status: Single    Spouse name: Not on file  . Number of children: 0  . Years of education: 68  . Highest education level: Not on file  Occupational History  . Occupation: Disabled  Social Needs  . Financial resource strain: Not hard at all  . Food insecurity    Worry: Never true    Inability: Never true  . Transportation needs    Medical: No    Non-medical: No  Tobacco Use  . Smoking status: Never Smoker  . Smokeless tobacco: Never Used  Substance and Sexual Activity  . Alcohol use: No  . Drug use: No  . Sexual activity: Not on file  Lifestyle  . Physical activity    Days per week: Patient refused    Minutes per session: Patient refused  . Stress: Not at all  Relationships  . Social Herbalist on phone: Twice a week    Gets together: Twice a week    Attends religious service: Never    Active member of club or organization: No  Attends meetings of clubs or organizations: Never    Relationship status: Not on file  Other Topics Concern  . Not on file  Social History Narrative   Lives with Aunt [name])   Caffeine use: No coffee   Soda- trying to quit      Right-handed     Family History: The patient's family history includes Brain cancer in his mother; High blood pressure in his mother.  ROS:   Please see the history of present illness.    ROS All other systems reviewed and are negative.  EKGs/Labs/Other Studies Reviewed:    The following studies were reviewed today: Echocardiogram 06/2018 Study Conclusions - Left ventricle: The cavity size was normal. Systolic function was   normal. The estimated ejection fraction was in the range of 55%   to 60%. Wall motion was normal; there were no regional wall   motion abnormalities.  Carotid  ultrasound 06/08/18 Less than 50% stenosis in the right and left internal carotid arteries.  Cardiac telemetry monitoring 07/04/18 Sinus rhythm.  No arrhythmias.  No atrial fibrillation.  No AV block or pauses.  EKG:  EKG is  ordered today.  The ekg ordered today demonstrates   Recent Labs: 06/07/2018: ALT 17 06/09/2018: BUN 11; Creatinine, Ser 1.40; Hemoglobin 14.6; Magnesium 2.3; Platelets 217; Potassium 3.6; Sodium 141   Recent Lipid Panel    Component Value Date/Time   CHOL 151 06/08/2018 0428   TRIG 277 (H) 06/08/2018 0428   HDL 27 (L) 06/08/2018 0428   CHOLHDL 5.6 06/08/2018 0428   VLDL 55 (H) 06/08/2018 0428   LDLCALC 69 06/08/2018 0428    Physical Exam:    VS:  BP 120/70   Pulse 79   Wt 204 lb 6.4 oz (92.7 kg)   SpO2 99%   BMI 32.99 kg/m     Wt Readings from Last 3 Encounters:  04/10/19 204 lb 6.4 oz (92.7 kg)  03/28/19 206 lb 5 oz (93.6 kg)  03/08/19 207 lb (93.9 kg)     GEN:  Well nourished, well developed in no acute distress HEENT: Normal NECK: No JVD; No carotid bruits LYMPHATICS: No lymphadenopathy CARDIAC: RRR, no murmurs, rubs, gallops RESPIRATORY:   Clear to auscultation without rales, wheezing or rhonchi  ABDOMEN: Soft, non-tender, non-distended MUSCULOSKELETAL:  No is visit today that was the shortedema; No deformity  SKIN: Warm and dry NEUROLOGIC:  Alert and oriented x 3 PSYCHIATRIC:  Normal affect   ASSESSMENT:    1. Atypical chest pain   2. Dyspnea on exertion   3. OSA on CPAP    PLAN:    In order of problems listed above:  1. Atypical chest pain -denies having any 2. DOE -doing much better from that point review.  He lost significant amount of weight which undoubtedly helps.  OSA on CPAP -follow-up by neurology. 3. Cerebrovascular disease - s/p CVA. Workup negative for cardioembolic source 05/1828. Follows with neurology. Continue aspirin 81mg  daily.   Medication Adjustments/Labs and Tests Ordered: Current medicines are reviewed at  length with the patient today.  Concerns regarding medicines are outlined above.  No orders of the defined types were placed in this encounter.  No orders of the defined types were placed in this encounter.   Patient Instructions  Medication Instructions:  Your physician recommends that you continue on your current medications as directed. Please refer to the Current Medication list given to you today.  If you need a refill on your cardiac medications before your  next appointment, please call your pharmacy.   Lab work: None If you have labs (blood work) drawn today and your tests are completely normal, you will receive your results only by: Marland Kitchen MyChart Message (if you have MyChart) OR . A paper copy in the mail If you have any lab test that is abnormal or we need to change your treatment, we will call you to review the results.  Testing/Procedures: NOne  Follow-Up: At [facility], you and your health needs are our priority.  As part of our continuing mission to provide you with exceptional heart care, we have created designated Provider Care Teams.  These Care Teams include your primary Cardiologist (physician) and Advanced Practice Providers (APPs -  Physician Assistants and Nurse Practitioners) who all work together to provide you with the care you need, when you need it. You will need a follow up appointment in 1 years.  Any Other Special Instructions Will Be Listed Below (If Applicable).       Signed, Jenne Campus, MD  04/10/2019 3:50 PM    [facility]

## 2019-04-10 NOTE — Patient Instructions (Signed)
Medication Instructions:  Your physician recommends that you continue on your current medications as directed. Please refer to the Current Medication list given to you today.  If you need a refill on your cardiac medications before your next appointment, please call your pharmacy.   Lab work: None If you have labs (blood work) drawn today and your tests are completely normal, you will receive your results only by: Marland Kitchen MyChart Message (if you have MyChart) OR . A paper copy in the mail If you have any lab test that is abnormal or we need to change your treatment, we will call you to review the results.  Testing/Procedures: NOne  Follow-Up: At [facility], you and your health needs are our priority.  As part of our continuing mission to provide you with exceptional heart care, we have created designated Provider Care Teams.  These Care Teams include your primary Cardiologist (physician) and Advanced Practice Providers (APPs -  Physician Assistants and Nurse Practitioners) who all work together to provide you with the care you need, when you need it. You will need a follow up appointment in 1 years.  Any Other Special Instructions Will Be Listed Below (If Applicable).

## 2019-04-16 ENCOUNTER — Encounter (HOSPITAL_COMMUNITY): Payer: Self-pay | Admitting: Emergency Medicine

## 2019-04-16 ENCOUNTER — Inpatient Hospital Stay (HOSPITAL_COMMUNITY)
Admission: EM | Admit: 2019-04-16 | Discharge: 2019-04-20 | DRG: 065 | Disposition: A | Discharge status: Home Health | Payer: Medicare HMO | Attending: Internal Medicine | Admitting: Internal Medicine

## 2019-04-16 ENCOUNTER — Emergency Department (HOSPITAL_COMMUNITY): Payer: Medicare HMO

## 2019-04-16 ENCOUNTER — Other Ambulatory Visit: Payer: Self-pay

## 2019-04-16 DIAGNOSIS — I10 Essential (primary) hypertension: Secondary | ICD-10-CM | POA: Diagnosis present

## 2019-04-16 DIAGNOSIS — I6330 Cerebral infarction due to thrombosis of unspecified cerebral artery: Secondary | ICD-10-CM

## 2019-04-16 DIAGNOSIS — G4733 Obstructive sleep apnea (adult) (pediatric): Secondary | ICD-10-CM | POA: Diagnosis present

## 2019-04-16 DIAGNOSIS — Z982 Presence of cerebrospinal fluid drainage device: Secondary | ICD-10-CM | POA: Diagnosis not present

## 2019-04-16 DIAGNOSIS — Z1159 Encounter for screening for other viral diseases: Secondary | ICD-10-CM

## 2019-04-16 DIAGNOSIS — Z6830 Body mass index (BMI) 30.0-30.9, adult: Secondary | ICD-10-CM

## 2019-04-16 DIAGNOSIS — R29818 Other symptoms and signs involving the nervous system: Secondary | ICD-10-CM | POA: Diagnosis not present

## 2019-04-16 DIAGNOSIS — I6340 Cerebral infarction due to embolism of unspecified cerebral artery: Principal | ICD-10-CM | POA: Diagnosis present

## 2019-04-16 DIAGNOSIS — I639 Cerebral infarction, unspecified: Secondary | ICD-10-CM

## 2019-04-16 DIAGNOSIS — Z91018 Allergy to other foods: Secondary | ICD-10-CM

## 2019-04-16 DIAGNOSIS — Z0389 Encounter for observation for other suspected diseases and conditions ruled out: Secondary | ICD-10-CM | POA: Diagnosis not present

## 2019-04-16 DIAGNOSIS — R471 Dysarthria and anarthria: Secondary | ICD-10-CM | POA: Diagnosis not present

## 2019-04-16 DIAGNOSIS — Z79899 Other long term (current) drug therapy: Secondary | ICD-10-CM

## 2019-04-16 DIAGNOSIS — R4789 Other speech disturbances: Secondary | ICD-10-CM | POA: Diagnosis present

## 2019-04-16 DIAGNOSIS — R531 Weakness: Secondary | ICD-10-CM

## 2019-04-16 DIAGNOSIS — Z20828 Contact with and (suspected) exposure to other viral communicable diseases: Secondary | ICD-10-CM | POA: Diagnosis not present

## 2019-04-16 DIAGNOSIS — Z7982 Long term (current) use of aspirin: Secondary | ICD-10-CM

## 2019-04-16 DIAGNOSIS — H903 Sensorineural hearing loss, bilateral: Secondary | ICD-10-CM | POA: Diagnosis present

## 2019-04-16 DIAGNOSIS — F419 Anxiety disorder, unspecified: Secondary | ICD-10-CM | POA: Diagnosis present

## 2019-04-16 DIAGNOSIS — I69359 Hemiplegia and hemiparesis following cerebral infarction affecting unspecified side: Secondary | ICD-10-CM

## 2019-04-16 DIAGNOSIS — R209 Unspecified disturbances of skin sensation: Secondary | ICD-10-CM | POA: Diagnosis present

## 2019-04-16 DIAGNOSIS — E039 Hypothyroidism, unspecified: Secondary | ICD-10-CM | POA: Diagnosis present

## 2019-04-16 DIAGNOSIS — Z85841 Personal history of malignant neoplasm of brain: Secondary | ICD-10-CM

## 2019-04-16 DIAGNOSIS — Z808 Family history of malignant neoplasm of other organs or systems: Secondary | ICD-10-CM

## 2019-04-16 DIAGNOSIS — R200 Anesthesia of skin: Secondary | ICD-10-CM | POA: Diagnosis present

## 2019-04-16 DIAGNOSIS — I69398 Other sequelae of cerebral infarction: Secondary | ICD-10-CM

## 2019-04-16 DIAGNOSIS — Z923 Personal history of irradiation: Secondary | ICD-10-CM

## 2019-04-16 DIAGNOSIS — Z91041 Radiographic dye allergy status: Secondary | ICD-10-CM

## 2019-04-16 DIAGNOSIS — G40909 Epilepsy, unspecified, not intractable, without status epilepticus: Secondary | ICD-10-CM | POA: Diagnosis present

## 2019-04-16 DIAGNOSIS — Z882 Allergy status to sulfonamides status: Secondary | ICD-10-CM

## 2019-04-16 DIAGNOSIS — E785 Hyperlipidemia, unspecified: Secondary | ICD-10-CM | POA: Diagnosis present

## 2019-04-16 DIAGNOSIS — Z888 Allergy status to other drugs, medicaments and biological substances status: Secondary | ICD-10-CM

## 2019-04-16 DIAGNOSIS — R2990 Unspecified symptoms and signs involving the nervous system: Secondary | ICD-10-CM

## 2019-04-16 DIAGNOSIS — E669 Obesity, unspecified: Secondary | ICD-10-CM | POA: Diagnosis present

## 2019-04-16 HISTORY — DX: Weakness: R53.1

## 2019-04-16 LAB — CBC
HCT: 50 % (ref 39.0–52.0)
Hemoglobin: 16.5 g/dL (ref 13.0–17.0)
MCH: 30.5 pg (ref 26.0–34.0)
MCHC: 33 g/dL (ref 30.0–36.0)
MCV: 92.4 fL (ref 80.0–100.0)
Platelets: 239 10*3/uL (ref 150–400)
RBC: 5.41 MIL/uL (ref 4.22–5.81)
RDW: 12.1 % (ref 11.5–15.5)
WBC: 6 10*3/uL (ref 4.0–10.5)
nRBC: 0 % (ref 0.0–0.2)

## 2019-04-16 LAB — DIFFERENTIAL
Abs Immature Granulocytes: 0.02 10*3/uL (ref 0.00–0.07)
Basophils Absolute: 0 10*3/uL (ref 0.0–0.1)
Basophils Relative: 1 %
Eosinophils Absolute: 0.1 10*3/uL (ref 0.0–0.5)
Eosinophils Relative: 2 %
Immature Granulocytes: 0 %
Lymphocytes Relative: 25 %
Lymphs Abs: 1.5 10*3/uL (ref 0.7–4.0)
Monocytes Absolute: 0.3 10*3/uL (ref 0.1–1.0)
Monocytes Relative: 6 %
Neutro Abs: 4 10*3/uL (ref 1.7–7.7)
Neutrophils Relative %: 66 %

## 2019-04-16 LAB — APTT: aPTT: 31 seconds (ref 24–36)

## 2019-04-16 LAB — CBG MONITORING, ED
Glucose-Capillary: 87 mg/dL (ref 70–99)
Glucose-Capillary: 87 mg/dL (ref 70–99)

## 2019-04-16 LAB — I-STAT CHEM 8, ED
BUN: 10 mg/dL (ref 6–20)
Calcium, Ion: 1.22 mmol/L (ref 1.15–1.40)
Chloride: 109 mmol/L (ref 98–111)
Creatinine, Ser: 1.3 mg/dL — ABNORMAL HIGH (ref 0.61–1.24)
Glucose, Bld: 88 mg/dL (ref 70–99)
HCT: 48 % (ref 39.0–52.0)
Hemoglobin: 16.3 g/dL (ref 13.0–17.0)
Potassium: 3.6 mmol/L (ref 3.5–5.1)
Sodium: 141 mmol/L (ref 135–145)
TCO2: 19 mmol/L — ABNORMAL LOW (ref 22–32)

## 2019-04-16 LAB — PROTIME-INR
INR: 1.1 (ref 0.8–1.2)
Prothrombin Time: 13.7 seconds (ref 11.4–15.2)

## 2019-04-16 LAB — COMPREHENSIVE METABOLIC PANEL
ALT: 29 U/L (ref 0–44)
AST: 22 U/L (ref 15–41)
Albumin: 4 g/dL (ref 3.5–5.0)
Alkaline Phosphatase: 78 U/L (ref 38–126)
Anion gap: 8 (ref 5–15)
BUN: 10 mg/dL (ref 6–20)
CO2: 19 mmol/L — ABNORMAL LOW (ref 22–32)
Calcium: 9 mg/dL (ref 8.9–10.3)
Chloride: 113 mmol/L — ABNORMAL HIGH (ref 98–111)
Creatinine, Ser: 1.4 mg/dL — ABNORMAL HIGH (ref 0.61–1.24)
GFR calc Af Amer: >60 mL/min (ref >60)
GFR calc non Af Amer: >60 mL/min (ref >60)
Glucose, Bld: 92 mg/dL (ref 70–99)
Potassium: 3.7 mmol/L (ref 3.5–5.1)
Sodium: 140 mmol/L (ref 135–145)
Total Bilirubin: 1 mg/dL (ref 0.3–1.2)
Total Protein: 6.7 g/dL (ref 6.5–8.1)

## 2019-04-16 MED ORDER — SODIUM CHLORIDE 0.9% FLUSH
3.0000 mL | Freq: Once | INTRAVENOUS | Status: AC
Start: 2019-04-16 — End: 2019-04-16
  Administered 2019-04-16: 3 mL via INTRAVENOUS

## 2019-04-16 MED ORDER — ACETAMINOPHEN 325 MG PO TABS
650.0000 mg | ORAL_TABLET | Freq: Once | ORAL | Status: AC
  Administered 2019-04-16: 650 mg via ORAL
  Filled 2019-04-16: qty 2

## 2019-04-16 NOTE — ED Notes (Signed)
ED TO INPATIENT HANDOFF REPORT  ED Nurse Name and Phone #: Tray Martinique  S Name/Age/Gender [name] Love 48 y.o. male Room/Bed: 012C/012C  Code Status   Code Status: Prior  Home/SNF/Other Home Patient oriented to: self, place, time and situation Is this baseline? Yes   Triage Complete: Triage complete  Chief Complaint stroke sx  Triage Note Pt reports that he has felt off balance for 3 days and also felt like he had slurred speech. Denies weakness on on side or the other. Pt states both legs "feel off balance." Pt also states he has a headache.   Allergies Allergies  Allergen Reactions  . Baclofen     Urinary retension  . Seroquel [Quetiapine]     Weight gain  . Sulfa Antibiotics   . Coconut Flavor Rash    ANYTHING COCONUT  . Contrast Media [Iodinated Diagnostic Agents] Rash    Level of Care/Admitting Diagnosis ED Disposition    ED Disposition Condition Comment   Admit  Hospital Area: [facility] [[id number]]  Level of Care: Med-Surg [16]  I expect the patient will be discharged within 24 hours: No (not a candidate for 5C-Observation unit)  Covid Evaluation: Asymptomatic Screening Protocol (No Symptoms)  Diagnosis: Weakness [324401]  Admitting Physician: Rise Patience [id number]  Attending Physician: Rise Patience Lei.Right  PT Class (Do Not Modify): Observation [104]  PT Acc Code (Do Not Modify): Observation [10022]       B Medical/Surgery History Past Medical History:  Diagnosis Date  . Abnormal gait   . Allergic rhinitis   . Anxiety   . Bilateral arm weakness   . Brain cancer ([place])   . Depression   . 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 Hearing loss, sensorineural   . Hemiparesis and alteration of sensations as late effects of stroke ([place]) 09/04/2018  . Hypothyroidism   . Insomnia   . Left leg weakness   . Low vitamin B12 level   . Seizures ([place])    Past Surgical History:  Procedure Laterality Date  . BRAIN SURGERY    . HERNIA  REPAIR    . SHUNT REVISION       A IV Location/Drains/Wounds Patient Lines/Drains/Airways Status   Active Line/Drains/Airways    Name:   Placement date:   Placement time:   Site:   Days:   Peripheral IV 04/16/19 Right Hand   04/16/19    1232    Hand   less than 1          Intake/Output Last 24 hours No intake or output data in the 24 hours ending 04/16/19 2045  Labs/Imaging Results for orders placed or performed during the hospital encounter of 04/16/19 (from the past 48 hour(s))  CBG monitoring, ED     Status: None   Collection Time: 04/16/19 11:56 AM  Result Value Ref Range   Glucose-Capillary 87 70 - 99 mg/dL   Comment 1 Notify RN    Comment 2 Document in Chart   Protime-INR     Status: None   Collection Time: 04/16/19 12:02 PM  Result Value Ref Range   Prothrombin Time 13.7 11.4 - 15.2 seconds   INR 1.1 0.8 - 1.2    Comment: (NOTE) INR goal varies based on device and disease states. Performed at [facility], [place] [address], [place], [place] [address]   APTT     Status: None   Collection Time: 04/16/19 12:02 PM  Result Value Ref Range  aPTT 31 24 - 36 seconds    Comment: Performed at [facility], [place] [address], [place], [place] [address]  CBC     Status: None   Collection Time: 04/16/19 12:02 PM  Result Value Ref Range   WBC 6.0 4.0 - 10.5 K/uL   RBC 5.41 4.22 - 5.81 MIL/uL   Hemoglobin 16.5 13.0 - 17.0 g/dL   HCT 50.0 39.0 - 52.0 %   MCV 92.4 80.0 - 100.0 fL   MCH 30.5 26.0 - 34.0 pg   MCHC 33.0 30.0 - 36.0 g/dL   RDW 12.1 11.5 - 15.5 %   Platelets 239 150 - 400 K/uL   nRBC 0.0 0.0 - 0.2 %    Comment: Performed at [facility], [place] [address], [place], [place] [address]  Differential     Status: None   Collection Time: 04/16/19 12:02 PM  Result Value Ref Range   Neutrophils Relative % 66 %   Neutro Abs 4.0 1.7 - 7.7 K/uL   Lymphocytes Relative 25 %   Lymphs Abs 1.5 0.7 - 4.0 K/uL   Monocytes Relative 6 %   Monocytes Absolute 0.3  0.1 - 1.0 K/uL   Eosinophils Relative 2 %   Eosinophils Absolute 0.1 0.0 - 0.5 K/uL   Basophils Relative 1 %   Basophils Absolute 0.0 0.0 - 0.1 K/uL   Immature Granulocytes 0 %   Abs Immature Granulocytes 0.02 0.00 - 0.07 K/uL    Comment: Performed at [place] [address], [place], [place] [address]  Comprehensive metabolic panel     Status: Abnormal   Collection Time: 04/16/19 12:02 PM  Result Value Ref Range   Sodium 140 135 - 145 mmol/L   Potassium 3.7 3.5 - 5.1 mmol/L   Chloride 113 (H) 98 - 111 mmol/L   CO2 19 (L) 22 - 32 mmol/L   Glucose, Bld 92 70 - 99 mg/dL   BUN 10 6 - 20 mg/dL   Creatinine, Ser 1.40 (H) 0.61 - 1.24 mg/dL   Calcium 9.0 8.9 - 10.3 mg/dL   Total Protein 6.7 6.5 - 8.1 g/dL   Albumin 4.0 3.5 - 5.0 g/dL   AST 22 15 - 41 U/L   ALT 29 0 - 44 U/L   Alkaline Phosphatase 78 38 - 126 U/L   Total Bilirubin 1.0 0.3 - 1.2 mg/dL   GFR calc non Af Amer >60 >60 mL/min   GFR calc Af Amer >60 >60 mL/min   Anion gap 8 5 - 15    Comment: Performed at [facility], [place] [address] [place],  [address]  I-stat chem 8, ED     Status: Abnormal   Collection Time: 04/16/19 12:27 PM  Result Value Ref Range   Sodium 141 135 - 145 mmol/L   Potassium 3.6 3.5 - 5.1 mmol/L   Chloride 109 98 - 111 mmol/L   BUN 10 6 - 20 mg/dL   Creatinine, Ser 1.30 (H) 0.61 - 1.24 mg/dL   Glucose, Bld 88 70 - 99 mg/dL   Calcium, Ion 1.22 1.15 - 1.40 mmol/L   TCO2 19 (L) 22 - 32 mmol/L   Hemoglobin 16.3 13.0 - 17.0 g/dL   HCT 48.0 39.0 - 52.0 %  CBG monitoring, ED     Status: None   Collection Time: 04/16/19 12:31 PM  Result Value Ref Range   Glucose-Capillary 87 70 - 99 mg/dL   Mr Brain Wo Contrast  Result Date: 04/16/2019 CLINICAL DATA:  Ataxia, rule out stroke. History of brain tumor with surgery and radiation. Shunt. EXAM: MRI HEAD WITHOUT CONTRAST TECHNIQUE: Multiplanar, multiecho pulse sequences of the brain and surrounding structures were obtained without  intravenous contrast. COMPARISON:  CT head 06/07/2018.  MRI 06/11/2018 FINDINGS: Brain: Occipital craniotomy for posterior fossa tumor resection per history. Extensive calcification in the cerebellar hemispheres bilaterally and in the pons best seen on CT. This is chronic and most likely due to prior radiation change. Fourth ventricle is chronically dilated likely due to prior surgery. The third and lateral ventricles are nondilated. Right parietal shunt catheter crosses the midline into the left lateral ventricle unchanged from the prior study. Negative for acute infarct. Patchy white matter changes bilaterally may be due to radiation change and or chronic small vessel ischemia. Chronic lacunar infarctions in the thalamus bilaterally unchanged. Small extra-axial CSF collections stable from the prior study. No midline shift. Vascular: Normal arterial flow voids Skull and upper cervical spine: Occipital craniotomy. No acute skeletal abnormality. Sinuses/Orbits: Negative Other: None IMPRESSION: Negative for acute infarct Post surgical resection of posterior fossa tumor. No recurrent tumor. No change from the prior studies. Electronically Signed   By: Franchot Gallo M.D.   On: 04/16/2019 18:42    Pending Labs Unresulted Labs (From admission, onward)    Start     Ordered   04/16/19 2035  Novel Coronavirus,NAA,(SEND-OUT TO REF LAB - TAT 24-48 hrs); Hosp Order  (Asymptomatic Patients Labs)  ONCE - STAT,   STAT    Question:  Rule Out  Answer:  Yes   04/16/19 2034          Vitals/Pain Today's Vitals   04/16/19 1854 04/16/19 1915 04/16/19 2000 04/16/19 2030  BP:  (!) 128/92 100/89 103/84  Pulse:  70 65 72  Resp:  15 15   Temp:      TempSrc:      SpO2:  99% 97% 97%  Weight:      Height:      PainSc: 4        Isolation Precautions No active isolations  Medications Medications  sodium chloride flush (NS) 0.9 % injection 3 mL (3 mLs Intravenous Given 04/16/19 1237)  acetaminophen (TYLENOL) tablet  650 mg (650 mg Oral Given 04/16/19 1928)    Mobility walks Moderate fall risk   Focused Assessments Neuro Assessment Handoff:  Swallow screen pass? Yes    NIH Stroke Scale ( + Modified Stroke Scale Criteria)  Interval: Initial Level of Consciousness (1a.)   : Alert, keenly responsive LOC Questions (1b. )   +: Answers both questions correctly LOC Commands (1c. )   + : Performs both tasks correctly Best Gaze (2. )  +: Normal Visual (3. )  +: No visual loss Facial Palsy (4. )    : Normal symmetrical movements Motor Arm, Left (5a. )   +: No drift Motor Arm, Right (5b. )   +: No drift Motor Leg, Left (6a. )   +: No drift Motor Leg, Right (6b. )   +: No drift Limb Ataxia (7. ): Absent Sensory (8. )   +: Normal, no sensory loss Best Language (9. )   +: No aphasia Dysarthria (10. ): Mild-to-moderate dysarthria, patient slurs at least some words and, at worst, can be understood with some difficulty Extinction/Inattention (11.)   +: No Abnormality Modified SS Total  +: 0 Complete NIHSS TOTAL: 1     Neuro Assessment: Exceptions to [facility] Neuro  Checks:   Initial (04/16/19 1216)  Last Documented NIHSS Modified Score: 0 (04/16/19 1216) Has TPA been given? No If patient is a Neuro Trauma and patient is going to OR before floor call report to [facility] nurse: [phone] or [phone]     R Recommendations: See Admitting Provider Note  Report given to:   Additional Notes: Pt is HOH; he reads lips

## 2019-04-16 NOTE — ED Triage Notes (Signed)
Pt reports that he has felt off balance for 3 days and also felt like he had slurred speech. Denies weakness on on side or the other. Pt states both legs "feel off balance." Pt also states he has a headache.

## 2019-04-16 NOTE — ED Provider Notes (Signed)
Transfer of Care Note  I assumed care of [name] Love on 04/16/2019  Briefly, [name] Love is a 48 y.o. male who:  Presented to the ED with BLE weakness and changes in his speech over the past 3 to 4 days  Patient concerned that he may have had a stroke. Complex PMHx including prior CVA, brain tumor with multiple surgeries and with a ventriculo-venous programmable shunt in place  The plan includes:  F/u MRI of brain results and touch base with MRI care team regarding need for shunt reprogramming   Please refer to the original provider's note for details of the patient's HPI & work-up prior to handoff to this physician.  I have reviewed the HPI, exam, labs and imaging results obtained prior to taking over patient care, and I have received a verbal handoff from the above provider.  Agree with history, physical exam and plan.   Reassessment: I personally reassessed the patient:  Vital Signs:  The most current vitals were  Vitals:   04/16/19 1230 04/16/19 1315  BP: 119/84   Pulse: 70 70  Resp: 18 (!) 21  Temp:    SpO2: 97% 97%    Hemodynamics:  The patient is hemodynamically stable. Mental Status:  The patient is alert, oriented, answers all questions but with a delayed/sluggish/dysarthric speech pattern without evidence of word-finding difficulty.   Additional MDM: I have reviewed the lab work obtained prior to handoff, laboratory work-up is significant for elevated serum creatinine, although this appears to be at his baseline. Imaging MRI results shows no evidence of acute infarct.  Note that on reassessment, his presenting c/o BLE weakness and lower leg sensory deficit (left > right) persists, as does his dysarthria, which he confirms is new for him in the last 3-4 days.   Consulted on-call Neurologist, Dr. Cheral Marker, and discussed [name] Love complex PMHx, his presenting concerns and persistent symptoms despite normal non-con MRI brain, as well as the need for his programmable shunt to be  reset/programmed before he is able to be discharged. Dr. Cheral Marker advised that someone with [facility] will need to come to reprogram the pt's shunt, and that will not be able to be done until tomorrow morning, and the is not safe to discharge the patient home without his shunt reprogrammed/functional.  Additionally, given the patient's persistent symptoms and complex history, Dr. Cheral Marker will come to the emergency department to evaluate [name] Love  [facility] service consulted for admission. [name] Love updated to plan and is in agreement.  The plan for this patient was discussed with my attending physician, Dr. Lajean Saver, who voiced agreement and who oversaw evaluation and treatment of this patient.   Clinical Impression: 1. Stroke-like symptoms   2. Weakness   3. Dysarthria     Disposition:  Admit  Aysia Lowder A. Jimmye Norman, MD Resident Physician, PGY-3 Emergency Medicine [facility] of Medicine    Jefm Petty, MD 04/17/19 1351    Lajean Saver, MD 04/17/19 [phone]

## 2019-04-16 NOTE — Consult Note (Addendum)
NEURO HOSPITALIST CONSULT NOTE   Requestig physician: Dr. Ashok Cordia  Reason for Consult: Difficulty with speech and worsened neuropathic pain   History obtained from:   Patient and Chart     HPI:                                                                                                                                          [name] Love is an 48 y.o. male presenting with a 3 day history of waxing and waning speech changes. The patient states that his speech is slurred, but that there is no word-finding deficit. There is no comprehension deficit and also no confusion. He also complains of sensory numbness involving his left lower leg circumferentially, sparing his foot and thigh.   He has a history of brain tumor resection in his posterior fossa, in addition to programmable shunt placement. He was last seen by Dr. Cyndy Freeze for assessment of his shunt, which was originally placed by a Neurosurgeon in [place].   Past neurological history includes brain cancer (ependymoma), abnormal gait, bilateral arm weakness, bilateral sensorineural hearing loss. Hemiparesis and altered sensation as late effects of stroke, LLE weakness, low vitamin B!2 level and seizures.   MRI brain images reviewed. Shunt is noted with normal ventricular size. Posterior fossa changes consistent with prior tumor resection.   Past Medical History:  Diagnosis Date  . Abnormal gait   . Allergic rhinitis   . Anxiety   . Bilateral arm weakness   . Brain cancer ([place])   . Depression   . 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 Hearing loss, sensorineural   . Hemiparesis and alteration of sensations as late effects of stroke ([place]) 09/04/2018  . Hypothyroidism   . Insomnia   . Left leg weakness   . Low vitamin B12 level   . Seizures ([place])     Past Surgical History:  Procedure Laterality Date  . BRAIN SURGERY    . HERNIA REPAIR    . SHUNT REVISION      Family History  Problem Relation Age of Onset  .  Brain cancer Mother   . High blood pressure Mother               Social History:  reports that he has never smoked. He has never used smokeless tobacco. He reports that he does not drink alcohol or use drugs.  Allergies  Allergen Reactions  . Baclofen     Urinary retension  . Seroquel [Quetiapine]     Weight gain  . Sulfa Antibiotics   . Coconut Flavor Rash    ANYTHING COCONUT  . Contrast Media [Iodinated Diagnostic Agents] Rash    HOME MEDICATIONS:  Xanax ASA Topamax   ROS:                                                                                                                                       As per HPI. Feels off balance in his legs. Denies current headache. No limb weakness or numbness. Feels as though legs may give way. No fever, cough, N/V, confusion or dizziness. Does not endorse chest pain or rash. No vision changes. Other ROS as per HPI with all other systems negative.    Blood pressure (!) 128/92, pulse 70, temperature 98.6 F (37 C), temperature source Oral, resp. rate 15, height 5\' 8"  (1.727 m), weight 89.4 kg, SpO2 99 %.   General Examination:                                                                                                       Physical Exam  HEENT-  [place]/AT   Lungs- Respirations unlabored Extremities- No edema  Neurological Examination Mental Status: Alert, fully oriented, thought content appropriate.  Speech dysarthric with a scanning/cerebellar quality but fluency is intact with normal comprehension and naming. Affect appropriate. Able to follow all commands without difficulty. Cranial Nerves: II:  Visual fields intact bilaterally. No extinction to DSS. PERRL.  III,IV, VI: No ptosis. EOMI without nystagmus. Mild saccadic quality to visual pursuits is noted.  V,VII: Smile symmetric, facial temp sensation equal  bilaterally VIII: HOH bilaterally with hearing aids in place.  IX,X: No hypophonia or hoarseness.  XI: Symmetric shoulder shrug XII: Midline tongue extension Motor: Right : Upper extremity   5/5    Left:     Upper extremity   5/5  Lower extremity   5/5     Lower extremity   5/5 Normal tone throughout; no atrophy noted Sensory: Decreased temp and FT sensation to LLE below knee in a circumferential distribution. Decreased sensation to right foot. Upper extremities normal with no extinction to DSS. Deep Tendon Reflexes: 2+ and symmetric throughout Cerebellar: No ataxia with FNF bilaterally  Gait: Deferred   Lab Results: Basic Metabolic Panel: Recent Labs  Lab 04/16/19 1202 04/16/19 1227  NA 140 141  K 3.7 3.6  CL 113* 109  CO2 19*  --   GLUCOSE 92 88  BUN 10 10  CREATININE 1.40* 1.30*  CALCIUM 9.0  --     CBC: Recent Labs  Lab 04/16/19 1202 04/16/19 1227  WBC 6.0  --   NEUTROABS 4.0  --  HGB 16.5 16.3  HCT 50.0 48.0  MCV 92.4  --   PLT 239  --     Cardiac Enzymes: No results for input(s): CKTOTAL, CKMB, CKMBINDEX, TROPONINI in the last 168 hours.  Lipid Panel: No results for input(s): CHOL, TRIG, HDL, CHOLHDL, VLDL, LDLCALC in the last 168 hours.  Imaging: Mr Brain Wo Contrast  Result Date: 04/16/2019 CLINICAL DATA:  Ataxia, rule out stroke. History of brain tumor with surgery and radiation. Shunt. EXAM: MRI HEAD WITHOUT CONTRAST TECHNIQUE: Multiplanar, multiecho pulse sequences of the brain and surrounding structures were obtained without intravenous contrast. COMPARISON:  CT head 06/07/2018.  MRI 06/11/2018 FINDINGS: Brain: Occipital craniotomy for posterior fossa tumor resection per history. Extensive calcification in the cerebellar hemispheres bilaterally and in the pons best seen on CT. This is chronic and most likely due to prior radiation change. Fourth ventricle is chronically dilated likely due to prior surgery. The third and lateral ventricles are  nondilated. Right parietal shunt catheter crosses the midline into the left lateral ventricle unchanged from the prior study. Negative for acute infarct. Patchy white matter changes bilaterally may be due to radiation change and or chronic small vessel ischemia. Chronic lacunar infarctions in the thalamus bilaterally unchanged. Small extra-axial CSF collections stable from the prior study. No midline shift. Vascular: Normal arterial flow voids Skull and upper cervical spine: Occipital craniotomy. No acute skeletal abnormality. Sinuses/Orbits: Negative Other: None IMPRESSION: Negative for acute infarct Post surgical resection of posterior fossa tumor. No recurrent tumor. No change from the prior studies. Electronically Signed   By: Franchot Gallo M.D.   On: 04/16/2019 18:42    Assessment: 48 year old male with history of posterior fossa brain tumor resection (ependymoma) and ventriculo-venous shunt, presenting with speech changes 1. MRI brain without contrast reveals normal-size of the ventricles in the context of shunt. Chronic appearing posterior fossa postoperative changes related to prior tumor resection are noted. Images personally reviewed. 2. Exam reveals sensory numbness involving the patient's LLE below the knee in a circumferential distribution, as well as right foot sensory numbness. He has dysarthric speech with a scanning/cerebellar quality, which is most likely secondary to the chronic postoperative changes, extensive calcifications and atrophy involving his cerebellar hemispheres.  3. History of low B12 level. Last B12 in 2018 was 764.  4. Past neurological history also includes abnormal gait, bilateral arm weakness, bilateral sensorineural hearing loss, hemiparesis and altered sensation as late effects of stroke, LLE weakness and seizures. He does not report any seizures prior to this admission.    Recommendations: 1. Follow up MRI brain with post-contrast images to assess for possible  abnormal enhancement.  2. As he has had an MRI today, he will need his shunt to be reprogrammed in the morning by [facility]. Per patient, the last Neurosurgeon who evaluated his shunt was Dr. Cyndy Freeze.  3. Vitamin B12 level.    Electronically signed: Dr. Kerney Elbe 04/16/2019, 7:30 PM

## 2019-04-16 NOTE — ED Notes (Signed)
Patient transported to MRI 

## 2019-04-16 NOTE — ED Provider Notes (Signed)
[facility] EMERGENCY DEPARTMENT Provider Note   CSN: [id number] Arrival date & time: 04/16/19  1150    History   Chief Complaint Chief Complaint  Patient presents with  . Stroke Symptoms    HPI Gilad Dugger is a 48 y.o. male.     49yo male with complex medical history including CVA (states x 2, 6 months ago, seen by neurology, no deficits), brain tumor ( x17, treated several years ago with radiation, has a shunt), sensorineural hearing loss, not on blood thinners, presents with complaint of bilateral leg weakness and slurred speech x 3 days. Reports a mild headache several days ago, none since. Denies changes in sensation, arm weakness, recent falls or injuries. Patient states he feels like his legs are going to give out on him and is concerned he may have had another stroke.      Past Medical History:  Diagnosis Date  . Abnormal gait   . Allergic rhinitis   . Anxiety   . Bilateral arm weakness   . Brain cancer ([place])   . Depression   . 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 Hearing loss, sensorineural   . Hemiparesis and alteration of sensations as late effects of stroke ([place]) 09/04/2018  . Hypothyroidism   . Insomnia   . Left leg weakness   . Low vitamin B12 level   . Seizures [facility])     Patient Active Problem List   Diagnosis Date Noted  . Generalized anxiety disorder 11/28/2018  . OSA on CPAP 11/28/2018  . Hemiparesis and alteration of sensations as late effects of stroke ([place]) 09/04/2018  . Acute arterial ischemic stroke, vertebrobasilar, thalamic ([place]) 06/08/2018  . Seizure ([place])   . Recurrent falls   . Diplopia   . Acute CVA (cerebrovascular accident) ([place]) 06/07/2018  . Allergic rhinitis 04/03/2018  . Atypical chest pain 11/30/2017  . Dyspnea on exertion 11/30/2017  . Myalgia 04/18/2017  . Seizures ([place]) 01/20/2017    Past Surgical History:  Procedure Laterality Date  . BRAIN SURGERY    . HERNIA REPAIR    . SHUNT REVISION           Home Medications    Prior to Admission medications   Medication Sig Start Date End Date Taking? Authorizing Provider  ALPRAZolam Duanne Moron) 0.5 MG tablet Take 0.5 mg by mouth 2 (two) times daily. 05/30/18  Yes [provider]  aspirin EC 81 MG EC tablet Take 1 tablet (81 mg total) by mouth daily. 06/12/18  Yes Purohit, Konrad Dolores, MD  topiramate (TOPAMAX) 50 MG tablet TAKE 1 TABLET BY MOUTH IN THE MORNING AND 2 TABLETS  IN THE EVENING Patient taking differently: Take 50-100 mg by mouth See admin instructions. TAKE 1 TABLET BY MOUTH IN THE MORNING AND 2 TABLETS  IN THE EVENING 03/28/19  Yes Kathrynn Ducking, MD    Family History Family History  Problem Relation Age of Onset  . Brain cancer Mother   . High blood pressure Mother     Social History Social History   Tobacco Use  . Smoking status: Never Smoker  . Smokeless tobacco: Never Used  Substance Use Topics  . Alcohol use: No  . Drug use: No     Allergies   Baclofen, Seroquel [quetiapine], Sulfa antibiotics, Coconut flavor, and Contrast media [iodinated diagnostic agents]   Review of Systems Review of Systems  Constitutional: Negative for fever.  Eyes: Negative for visual disturbance.  Respiratory: Negative for cough.   Gastrointestinal:  Negative for nausea and vomiting.  Skin: Negative for rash and wound.  Allergic/Immunologic: Negative for immunocompromised state.  Neurological: Positive for speech difficulty, weakness and headaches. Negative for dizziness.  Psychiatric/Behavioral: Negative for confusion.  All other systems reviewed and are negative.    Physical Exam Updated Vital Signs BP 119/84   Pulse 70   Temp 98.6 F (37 C) (Oral)   Resp (!) 21   Ht 5\' 8"  (1.727 m)   Wt 89.4 kg   SpO2 97%   BMI 29.95 kg/m   Physical Exam Vitals signs and nursing note reviewed.  Constitutional:      General: He is not in acute distress.    Appearance: He is well-developed. He is not diaphoretic.  HENT:      Head: Normocephalic and atraumatic.     Mouth/Throat:     Mouth: Mucous membranes are moist.  Eyes:     Extraocular Movements: Extraocular movements intact.     Pupils: Pupils are equal, round, and reactive to light.  Cardiovascular:     Rate and Rhythm: Normal rate and regular rhythm.     Pulses: Normal pulses.     Heart sounds: Normal heart sounds.  Pulmonary:     Effort: Pulmonary effort is normal.     Breath sounds: Normal breath sounds.  Musculoskeletal:        General: No swelling or tenderness.     Right lower leg: No edema.     Left lower leg: No edema.  Skin:    General: Skin is warm and dry.  Neurological:     Mental Status: He is alert and oriented to person, place, and time.     GCS: GCS eye subscore is 4. GCS verbal subscore is 5. GCS motor subscore is 6.     Sensory: No sensory deficit.     Motor: No weakness or tremor.     Coordination: Rapid alternating movements normal.     Deep Tendon Reflexes: Reflexes normal.     Comments: Speech difficult to assess due to hearing loss. No facial asymetery, no arm or leg weakness. No arm drift however arms unsteady when assessing pronator drift (states he can't close his eyes while standing or will fall secondary to brain tumor/treatment).  Psychiatric:        Behavior: Behavior normal.      ED Treatments / Results  Labs (all labs ordered are listed, but only abnormal results are displayed) Labs Reviewed  COMPREHENSIVE METABOLIC PANEL - Abnormal; Notable for the following components:      Result Value   Chloride 113 (*)    CO2 19 (*)    Creatinine, Ser 1.40 (*)    All other components within normal limits  I-STAT CHEM 8, ED - Abnormal; Notable for the following components:   Creatinine, Ser 1.30 (*)    TCO2 19 (*)    All other components within normal limits  PROTIME-INR  APTT  CBC  DIFFERENTIAL  CBG MONITORING, ED  CBG MONITORING, ED    EKG EKG Interpretation  Date/Time:  Monday April 16 2019 11:59:49 EDT  Ventricular Rate:  84 PR Interval:  152 QRS Duration: 76 QT Interval:  356 QTC Calculation: 420 R Axis:   72 Text Interpretation:  Normal sinus rhythm Normal ECG No significant change since last tracing Confirmed by Isla Pence [phone]) on 04/16/2019 12:11:52 PM Also confirmed by Isla Pence [phone]), editor Philomena Doheny [phone])  on 04/16/2019 1:54:59 PM   Radiology No results found.  Procedures Procedures (including critical care time)  Medications Ordered in ED Medications  sodium chloride flush (NS) 0.9 % injection 3 mL (3 mLs Intravenous Given 04/16/19 1237)     Initial Impression / Assessment and Plan / ED Course  I have reviewed the triage vital signs and the nursing notes.  Pertinent labs & imaging results that were available during my care of the patient were reviewed by me and considered in my medical decision making (see chart for details).  Clinical Course as of Apr 15 1517  Mon Apr 16, 2019  1516 47yo male with bilateral leg weakness, changes in speech x 3 days. History of prior CVA, brain tumor, programmable shunt in place. Plan is for MRI, however shunt will need to be reprogrammed after imaging, nursing in communication with MRI for this. Care signed out pending MRI.    [LM]    Clinical Course User Index [LM] Tacy Learn, PA-C      Final Clinical Impressions(s) / ED Diagnoses   Final diagnoses:  None    ED Discharge Orders    None       Roque Lias 04/16/19 1518    Isla Pence, MD 04/21/19 1511

## 2019-04-16 NOTE — ED Notes (Signed)
MD at bedside. 

## 2019-04-17 ENCOUNTER — Encounter (HOSPITAL_COMMUNITY): Payer: Self-pay | Admitting: Internal Medicine

## 2019-04-17 ENCOUNTER — Observation Stay (HOSPITAL_BASED_OUTPATIENT_CLINIC_OR_DEPARTMENT_OTHER): Payer: Medicare HMO

## 2019-04-17 ENCOUNTER — Observation Stay (HOSPITAL_COMMUNITY): Payer: Medicare HMO

## 2019-04-17 DIAGNOSIS — Z4541 Encounter for adjustment and management of cerebrospinal fluid drainage device: Secondary | ICD-10-CM | POA: Diagnosis not present

## 2019-04-17 DIAGNOSIS — G919 Hydrocephalus, unspecified: Secondary | ICD-10-CM | POA: Diagnosis not present

## 2019-04-17 DIAGNOSIS — R531 Weakness: Secondary | ICD-10-CM

## 2019-04-17 DIAGNOSIS — R4789 Other speech disturbances: Secondary | ICD-10-CM

## 2019-04-17 DIAGNOSIS — I6330 Cerebral infarction due to thrombosis of unspecified cerebral artery: Secondary | ICD-10-CM

## 2019-04-17 DIAGNOSIS — Z8673 Personal history of transient ischemic attack (TIA), and cerebral infarction without residual deficits: Secondary | ICD-10-CM

## 2019-04-17 DIAGNOSIS — I5030 Unspecified diastolic (congestive) heart failure: Secondary | ICD-10-CM

## 2019-04-17 DIAGNOSIS — I63542 Cerebral infarction due to unspecified occlusion or stenosis of left cerebellar artery: Secondary | ICD-10-CM | POA: Diagnosis not present

## 2019-04-17 DIAGNOSIS — I639 Cerebral infarction, unspecified: Secondary | ICD-10-CM | POA: Diagnosis not present

## 2019-04-17 DIAGNOSIS — I6350 Cerebral infarction due to unspecified occlusion or stenosis of unspecified cerebral artery: Secondary | ICD-10-CM | POA: Diagnosis not present

## 2019-04-17 HISTORY — DX: Cerebral infarction due to thrombosis of unspecified cerebral artery: I63.30

## 2019-04-17 HISTORY — DX: Other speech disturbances: R47.89

## 2019-04-17 HISTORY — DX: Weakness: R53.1

## 2019-04-17 LAB — CBC
HCT: 50.3 % (ref 39.0–52.0)
HCT: 50.2 % (ref 39.0–52.0)
Hemoglobin: 16.6 g/dL (ref 13.0–17.0)
Hemoglobin: 17.2 g/dL — ABNORMAL HIGH (ref 13.0–17.0)
MCH: 30 pg (ref 26.0–34.0)
MCH: 30.8 pg (ref 26.0–34.0)
MCHC: 33 g/dL (ref 30.0–36.0)
MCHC: 34.3 g/dL (ref 30.0–36.0)
MCV: 90.8 fL (ref 80.0–100.0)
MCV: 90 fL (ref 80.0–100.0)
Platelets: 224 10*3/uL (ref 150–400)
Platelets: 200 10*3/uL (ref 150–400)
RBC: 5.54 MIL/uL (ref 4.22–5.81)
RBC: 5.58 MIL/uL (ref 4.22–5.81)
RDW: 12.1 % (ref 11.5–15.5)
RDW: 12 % (ref 11.5–15.5)
WBC: 6.3 10*3/uL (ref 4.0–10.5)
WBC: 5.9 10*3/uL (ref 4.0–10.5)
nRBC: 0 % (ref 0.0–0.2)
nRBC: 0 % (ref 0.0–0.2)

## 2019-04-17 LAB — LIPID PANEL
Cholesterol: 119 mg/dL (ref 0–200)
HDL: 34 mg/dL — ABNORMAL LOW (ref >40)
LDL Cholesterol: 64 mg/dL (ref 0–99)
Total CHOL/HDL Ratio: 3.5 RATIO
Triglycerides: 104 mg/dL (ref <150)
VLDL: 21 mg/dL (ref 0–40)

## 2019-04-17 LAB — CREATININE, SERUM
Creatinine, Ser: 1.4 mg/dL — ABNORMAL HIGH (ref 0.61–1.24)
GFR calc Af Amer: >60 mL/min (ref >60)
GFR calc non Af Amer: >60 mL/min (ref >60)

## 2019-04-17 LAB — COMPREHENSIVE METABOLIC PANEL
ALT: 29 U/L (ref 0–44)
AST: 20 U/L (ref 15–41)
Albumin: 4 g/dL (ref 3.5–5.0)
Alkaline Phosphatase: 81 U/L (ref 38–126)
Anion gap: 10 (ref 5–15)
BUN: 12 mg/dL (ref 6–20)
CO2: 20 mmol/L — ABNORMAL LOW (ref 22–32)
Calcium: 9.1 mg/dL (ref 8.9–10.3)
Chloride: 109 mmol/L (ref 98–111)
Creatinine, Ser: 1.31 mg/dL — ABNORMAL HIGH (ref 0.61–1.24)
GFR calc Af Amer: >60 mL/min (ref >60)
GFR calc non Af Amer: >60 mL/min (ref >60)
Glucose, Bld: 83 mg/dL (ref 70–99)
Potassium: 3.7 mmol/L (ref 3.5–5.1)
Sodium: 139 mmol/L (ref 135–145)
Total Bilirubin: 0.9 mg/dL (ref 0.3–1.2)
Total Protein: 6.6 g/dL (ref 6.5–8.1)

## 2019-04-17 LAB — HEMOGLOBIN A1C
Hgb A1c MFr Bld: 5.3 % (ref 4.8–5.6)
Mean Plasma Glucose: 105.41 mg/dL

## 2019-04-17 LAB — HIV ANTIBODY (ROUTINE TESTING W REFLEX): HIV Screen 4th Generation wRfx: NONREACTIVE

## 2019-04-17 LAB — MAGNESIUM: Magnesium: 2.4 mg/dL (ref 1.7–2.4)

## 2019-04-17 LAB — VITAMIN B12: Vitamin B-12: 238 pg/mL (ref 180–914)

## 2019-04-17 LAB — ECHOCARDIOGRAM COMPLETE
Height: 68 in
Weight: 3185.21 oz

## 2019-04-17 LAB — TSH: TSH: 4.294 u[IU]/mL (ref 0.350–4.500)

## 2019-04-17 MED ORDER — GADOBUTROL 1 MMOL/ML IV SOLN
9.0000 mL | Freq: Once | INTRAVENOUS | Status: AC | PRN
  Administered 2019-04-17: 9 mL via INTRAVENOUS

## 2019-04-17 MED ORDER — ONDANSETRON HCL 4 MG PO TABS: 4.0000 mg | ORAL_TABLET | Freq: Four times a day (QID) | ORAL | Status: DC | PRN

## 2019-04-17 MED ORDER — ALPRAZOLAM 0.5 MG PO TABS
0.5000 mg | ORAL_TABLET | Freq: Two times a day (BID) | ORAL | Status: DC
  Administered 2019-04-17 – 2019-04-20 (×7): 0.5 mg via ORAL
  Filled 2019-04-17 (×7): qty 1

## 2019-04-17 MED ORDER — ASPIRIN 325 MG PO TABS: 325.0000 mg | ORAL_TABLET | Freq: Every day | ORAL | Status: DC

## 2019-04-17 MED ORDER — TOPIRAMATE 25 MG PO TABS
50.0000 mg | ORAL_TABLET | Freq: Every day | ORAL | Status: DC
  Administered 2019-04-17 – 2019-04-20 (×4): 50 mg via ORAL
  Filled 2019-04-17 (×4): qty 2

## 2019-04-17 MED ORDER — ASPIRIN EC 81 MG PO TBEC
81.0000 mg | DELAYED_RELEASE_TABLET | Freq: Every day | ORAL | Status: DC
  Administered 2019-04-17 – 2019-04-20 (×4): 81 mg via ORAL
  Filled 2019-04-17 (×4): qty 1

## 2019-04-17 MED ORDER — ASPIRIN 81 MG PO CHEW: 81.0000 mg | CHEWABLE_TABLET | Freq: Every day | ORAL | Status: DC

## 2019-04-17 MED ORDER — ASPIRIN 300 MG RE SUPP: 300.0000 mg | Freq: Every day | RECTAL | Status: DC

## 2019-04-17 MED ORDER — STROKE: EARLY STAGES OF RECOVERY BOOK
Freq: Once | Status: AC
  Administered 2019-04-17: 09:00:00
  Filled 2019-04-17: qty 1

## 2019-04-17 MED ORDER — TOPIRAMATE 100 MG PO TABS
100.0000 mg | ORAL_TABLET | Freq: Every day | ORAL | Status: DC
  Administered 2019-04-17 – 2019-04-19 (×3): 100 mg via ORAL
  Filled 2019-04-17 (×3): qty 1

## 2019-04-17 MED ORDER — ACETAMINOPHEN 650 MG RE SUPP: 650.0000 mg | Freq: Four times a day (QID) | RECTAL | Status: DC | PRN

## 2019-04-17 MED ORDER — CLOPIDOGREL BISULFATE 75 MG PO TABS
75.0000 mg | ORAL_TABLET | Freq: Every day | ORAL | Status: DC
  Administered 2019-04-17 – 2019-04-20 (×4): 75 mg via ORAL
  Filled 2019-04-17 (×4): qty 1

## 2019-04-17 MED ORDER — ACETAMINOPHEN 325 MG PO TABS: 650.0000 mg | ORAL_TABLET | Freq: Four times a day (QID) | ORAL | Status: DC | PRN

## 2019-04-17 MED ORDER — ONDANSETRON HCL 4 MG/2ML IJ SOLN: 4.0000 mg | Freq: Four times a day (QID) | INTRAMUSCULAR | Status: DC | PRN

## 2019-04-17 MED ORDER — ATORVASTATIN CALCIUM 80 MG PO TABS: 80.0000 mg | ORAL_TABLET | Freq: Every day | ORAL | Status: DC

## 2019-04-17 MED ORDER — ENOXAPARIN SODIUM 40 MG/0.4ML ~~LOC~~ SOLN
40.0000 mg | Freq: Every day | SUBCUTANEOUS | Status: DC
  Administered 2019-04-17 – 2019-04-19 (×4): 40 mg via SUBCUTANEOUS
  Filled 2019-04-17 (×4): qty 0.4

## 2019-04-17 NOTE — Progress Notes (Signed)
  Echocardiogram 2D Echocardiogram has been performed.  Jannett Celestine 04/17/2019, 10:44 AM

## 2019-04-17 NOTE — Progress Notes (Signed)
Carotid duplex has been completed.   Preliminary results in CV Proc.   Abram Sander 04/17/2019 10:39 AM

## 2019-04-17 NOTE — Progress Notes (Signed)
Patient seen and examined admitted earlier this morning by Dr. Hal Hope -This is a 48 year old male with history of ependymoma, underwent resection in 1995, with VP shunt placement, followed by brain radiation, history of seizures, this was followed by some more surgeries close to 20 years ago in [place], followed by Dr. Floyde Parkins neurology and [place] with neurosurgery  -Presented to the ED with left leg numbness, weakness and some slurring of his speech.  -Subacute stroke  -MRI with and without contrast last night in the emergency room notable for subacute right cerebellar infarcts. -Neurology consulting, currently on aspirin, Plavix and statin -Neurosurgery notified for shunt reprogramming after MRI -Monitor on telemetry, complete stroke work-up  History of ependymoma -Status post remote resection and whole brain radiation  Seizure disorder, continue Topamax  Domenic Polite, MD

## 2019-04-17 NOTE — Evaluation (Signed)
Physical Therapy Evaluation Patient Details Name: [name] Love MRN: [id number] DOB: 12/21/1970 Today's Date: 04/17/2019   History of Present Illness  Patient is a 48 y/o male presenting to the ED on 04/16/2019 with primary complaints of weakness and word finding difficulties. MRI revealing subacute infarct at right cerebellum. Past history of brain tumor resection in his posterior fossa, in addition to programmable shunt placement, seizures, hearing loss, anxiety.    Clinical Impression  Patient admitted with the above listed diagnosis. Patient reports Mod I with mobility and ADLs prior to admission with sue of rollator. Patient today demonstrating reduced balance, strength, safety, and general functional mobility. Min A for transfers with Min A for balance and steadying. Up to Mod A with gait due to instability as well as reduced safety awareness with RW. VSS throughout session without subjective complaints. Patient reporting instability with gait and recognizes current need for assistance. Due to current functional status, will recommend CIR level therapies at discharge. PT to continue to follow acutely.       Follow Up Recommendations CIR    Equipment Recommendations  None recommended by PT    Recommendations for Other Services Rehab consult;OT consult     Precautions / Restrictions Precautions Precautions: Fall Restrictions Weight Bearing Restrictions: No      Mobility  Bed Mobility Overal bed mobility: Modified Independent                Transfers Overall transfer level: Needs assistance Equipment used: Rolling walker (2 wheeled) Transfers: Sit to/from Stand Sit to Stand: Min assist         General transfer comment: Min A to stand at bedside and for initial standing balance  Ambulation/Gait Ambulation/Gait assistance: Min assist;Mod assist Gait Distance (Feet): 40 Feet Assistive device: Rolling walker (2 wheeled) Gait Pattern/deviations: Step-to  pattern;Step-through pattern;Decreased stride length;Trunk flexed(decreased heel strike B ) Gait velocity: decreased   General Gait Details: patient with reduced safety awareness; unsteady gait pattern with up to Mod  Afor balance with gait; patient stating he felt unstable and felt as if he would fall without assistance  Stairs            Wheelchair Mobility    Modified Rankin (Stroke Patients Only)       Balance Overall balance assessment: Needs assistance Sitting-balance support: No upper extremity supported;Feet supported Sitting balance-Leahy Scale: Good     Standing balance support: Bilateral upper extremity supported;During functional activity Standing balance-Leahy Scale: Poor                               Pertinent Vitals/Pain Pain Assessment: Faces Faces Pain Scale: Hurts a little bit Pain Location: B LE  Pain Descriptors / Indicators: Burning Pain Intervention(s): Limited activity within patient's tolerance;Monitored during session    Home Living Family/patient expects to be discharged to:: Private residence Living Arrangements: Other relatives Available Help at Discharge: Family;Available PRN/intermittently Type of Home: House Home Access: Stairs to enter Entrance Stairs-Rails: None Entrance Stairs-Number of Steps: 1 Home Layout: One level Home Equipment: Walker - 2 wheels;Walker - 4 wheels;Shower seat;Bedside commode      Prior Function Level of Independence: Independent with assistive device(s)         Comments: uses rollator at all times; has prism glasses, however still has some double vision     Hand Dominance   Dominant Hand: Left    Extremity/Trunk Assessment   Upper Extremity Assessment Upper Extremity Assessment: Defer  to OT evaluation    Lower Extremity Assessment Lower Extremity Assessment: Generalized weakness;RLE deficits/detail;LLE deficits/detail RLE Deficits / Details: R LE grossly 4/5 to 4+/5 RLE  Sensation: decreased light touch RLE Coordination: decreased fine motor;decreased gross motor LLE Deficits / Details: L LE grossly 4/5 LLE Sensation: decreased light touch LLE Coordination: decreased fine motor;decreased gross motor    Cervical / Trunk Assessment Cervical / Trunk Assessment: Normal  Communication   Communication: HOH(has B hearing aides)  Cognition Arousal/Alertness: Awake/alert Behavior During Therapy: WFL for tasks assessed/performed Overall Cognitive Status: Within Functional Limits for tasks assessed                                        General Comments General comments (skin integrity, edema, etc.): VSS with no subjective complaints in session    Exercises     Assessment/Plan    PT Assessment Patient needs continued PT services  PT Problem List Decreased strength;Decreased activity tolerance;Decreased mobility;Decreased balance;Decreased coordination;Decreased knowledge of use of DME;Decreased safety awareness       PT Treatment Interventions DME instruction;Gait training;Stair training;Functional mobility training;Therapeutic activities;Therapeutic exercise;Neuromuscular re-education;Balance training;Patient/family education    PT Goals (Current goals can be found in the Care Plan section)  Acute Rehab PT Goals Patient Stated Goal: get stronger, improve balance PT Goal Formulation: With patient Time For Goal Achievement: 05/01/19 Potential to Achieve Goals: Good    Frequency Min 4X/week   Barriers to discharge        Co-evaluation               AM-PAC PT "6 Clicks" Mobility  Outcome Measure Help needed turning from your back to your side while in a flat bed without using bedrails?: A Little Help needed moving from lying on your back to sitting on the side of a flat bed without using bedrails?: A Little Help needed moving to and from a bed to a chair (including a wheelchair)?: A Little Help needed standing up from a  chair using your arms (e.g., wheelchair or bedside chair)?: A Little Help needed to walk in hospital room?: A Lot Help needed climbing 3-5 steps with a railing? : Total 6 Click Score: 15    End of Session Equipment Utilized During Treatment: Gait belt Activity Tolerance: Patient tolerated treatment well Patient left: in bed;with call bell/phone within reach;with bed alarm set Nurse Communication: Mobility status PT Visit Diagnosis: Unsteadiness on feet (R26.81);Other abnormalities of gait and mobility (R26.89);Muscle weakness (generalized) (M62.81)    Time: 2841-3244 PT Time Calculation (min) (ACUTE ONLY): 20 min   Charges:   PT Evaluation $PT Eval Moderate Complexity: 1 Mod          Lanney Gins, PT, DPT Supplemental Physical Therapist 04/17/19 9:15 AM Pager: [phone] Office: [phone]

## 2019-04-17 NOTE — Progress Notes (Signed)
SLP Cancellation Note  Patient Details Name: [name] Love MRN: [id number] DOB: 03-Jun-1971   Cancelled treatment:       Reason Eval/Treat Not Completed: Patient at procedure or test/unavailable(Pt off the unit at this time. SLP will follow up. )  Nazim Kadlec I. Hardin Negus, [place], [place] Office number [phone] Pager [place] 04/17/2019, 12:06 PM

## 2019-04-17 NOTE — Care Management Obs Status (Signed)
[facility] NOTIFICATION   Patient Details  Name: [name] Love MRN: [id number] Date of Birth: [birth year]   Medicare Observation Status Notification Given:  Yes    Geralynn Ochs, LCSW 04/17/2019, 4:09 PM

## 2019-04-17 NOTE — Progress Notes (Signed)
SLP Cancellation Note  Patient Details Name: Carmeron Heady MRN: [id number] DOB: 09-Aug-1971   Cancelled treatment:       Reason Eval/Treat Not Completed: Patient at procedure or test/unavailable(Pt having Echo completed at this time. SLP will follow up )  Jefferey Lippmann I. Hardin Negus, [place], [place] Office number [phone] Pager [place] 04/17/2019, 10:18 AM

## 2019-04-17 NOTE — Progress Notes (Signed)
[facility] Neurosurgery regarding reprogramming of patient's shunt. On-call provider stated that someone will be by this morning to reprogram.

## 2019-04-17 NOTE — Progress Notes (Signed)
STROKE TEAM PROGRESS NOTE   INTERVAL HISTORY Pt lying in bed. Dr. Annette Stable is at bedside to reprogram the VPS after MRI. MRI with and without contrast concerning for subacute cerebellar infarcts. MRA showed right SCA occlusion but otherwise no significant stenosis.   Vitals:   04/17/19 0317 04/17/19 0700 04/17/19 1001 04/17/19 1226  BP: 106/65 (!) 109/93 114/82 (!) 131/93  Pulse: 66 67 74 84  Resp: 18 18  17   Temp: 98.5 F (36.9 C) 97.8 F (36.6 C) 97.9 F (36.6 C) 97.8 F (36.6 C)  TempSrc: Oral Oral Oral Axillary  SpO2: 99% 100% 100% 100%  Weight:      Height:        CBC:  Recent Labs  Lab 04/16/19 1202  04/17/19 0339 04/17/19 0810  WBC 6.0  --  6.3 5.9  NEUTROABS 4.0  --   --   --   HGB 16.5   < > 16.6 17.2*  HCT 50.0   < > 50.3 50.2  MCV 92.4  --  90.8 90.0  PLT 239  --  224 200   < > = values in this interval not displayed.    Basic Metabolic Panel:  Recent Labs  Lab 04/16/19 1202 04/16/19 1227 04/17/19 0339 04/17/19 0810  NA 140 141  --  139  K 3.7 3.6  --  3.7  CL 113* 109  --  109  CO2 19*  --   --  20*  GLUCOSE 92 88  --  83  BUN 10 10  --  12  CREATININE 1.40* 1.30* 1.40* 1.31*  CALCIUM 9.0  --   --  9.1  MG  --   --  2.4  --    Lipid Panel:     Component Value Date/Time   CHOL 119 04/17/2019 0810   TRIG 104 04/17/2019 0810   HDL 34 (L) 04/17/2019 0810   CHOLHDL 3.5 04/17/2019 0810   VLDL 21 04/17/2019 0810   LDLCALC 64 04/17/2019 0810   HgbA1c:  Lab Results  Component Value Date   HGBA1C 5.3 04/17/2019   Urine Drug Screen: No results found for: LABOPIA, COCAINSCRNUR, LABBENZ, AMPHETMU, THCU, LABBARB  Alcohol Level No results found for: [facility]  IMAGING Mr Angio Head Wo Contrast  Result Date: 04/17/2019 CLINICAL DATA:  Stroke. History of posterior fossa ependymoma resection 1995. VP shunt. Brain radiation. EXAM: MRA HEAD WITHOUT CONTRAST TECHNIQUE: Angiographic images of the Circle of Willis were obtained using MRA technique without  intravenous contrast. COMPARISON:  MRI head 04/16/2019 and 04/17/2019 with contrast. FINDINGS: Both vertebral arteries are patent and contribute to the basilar. PICA not included on this study due to patient positioning. Large right AICA patent. Small left AICA. Basilar widely patent. Left superior cerebellar artery is large in size and widely patent. Small right superior cerebellar artery appears to occlude proximally. Both posterior cerebral arteries normal. Internal carotid artery widely patent. Anterior and middle cerebral arteries widely patent without stenosis Negative for aneurysm IMPRESSION: Apparent obstruction proximal right superior cerebellar artery. Review of MRI brain earlier today with contrast reveals patchy enhancement in the right mid and superior cerebellum which could be related to subacute infarction. Otherwise no significant intracranial stenosis. Electronically Signed   By: Franchot Gallo M.D.   On: 04/17/2019 12:50   Mr Brain Wo Contrast  Result Date: 04/16/2019 CLINICAL DATA:  Ataxia, rule out stroke. History of brain tumor with surgery and radiation. Shunt. EXAM: MRI HEAD WITHOUT CONTRAST TECHNIQUE: Multiplanar, multiecho pulse sequences  of the brain and surrounding structures were obtained without intravenous contrast. COMPARISON:  CT head 06/07/2018.  MRI 06/11/2018 FINDINGS: Brain: Occipital craniotomy for posterior fossa tumor resection per history. Extensive calcification in the cerebellar hemispheres bilaterally and in the pons best seen on CT. This is chronic and most likely due to prior radiation change. Fourth ventricle is chronically dilated likely due to prior surgery. The third and lateral ventricles are nondilated. Right parietal shunt catheter crosses the midline into the left lateral ventricle unchanged from the prior study. Negative for acute infarct. Patchy white matter changes bilaterally may be due to radiation change and or chronic small vessel ischemia. Chronic  lacunar infarctions in the thalamus bilaterally unchanged. Small extra-axial CSF collections stable from the prior study. No midline shift. Vascular: Normal arterial flow voids Skull and upper cervical spine: Occipital craniotomy. No acute skeletal abnormality. Sinuses/Orbits: Negative Other: None IMPRESSION: Negative for acute infarct Post surgical resection of posterior fossa tumor. No recurrent tumor. No change from the prior studies. Electronically Signed   By: Franchot Gallo M.D.   On: 04/16/2019 18:42   Mr Brain W Contrast  Result Date: 04/17/2019 CLINICAL DATA:  Speech difficulty EXAM: MRI HEAD WITH CONTRAST TECHNIQUE: Multiplanar, multiecho pulse sequences of the brain and surrounding structures were obtained with intravenous contrast. CONTRAST:  9 cc Gadavist intravenous COMPARISON:  06/11/2018 FINDINGS: Brain: Small nodular focus of enhancement in the lateral right cerebellum where there is a cluster of chronic but progressive infarcts when yesterday's noncontrast study was compared with June 11, 2018. Remote lacunar infarcts seen in the bilateral thalamus and in the deep white matter. Nodular appearance of the right caudate head without enhancement is stable and may be atypical post ischemic appearance. Generalized dural thickening that was also seen previously, likely related to shunting. No hydrocephalus. History of ependymoma resection remotely.  No suspected recurrence. Vascular: Major vascular enhancements are preserved Skull and upper cervical spine: Suboccipital craniotomy. Sinuses/Orbits: Negative IMPRESSION: 1. Cluster of non acute infarcts in the right cerebellum that progressed between June 11, 2018 and April 16, 2019 scans. 1 of these foci is enhancing, compatible with subacute timing. There is premature microvascular ischemia which is presumably related to remote radiotherapy. 2. Chronic dural thickening in the setting of shunting. Electronically Signed   By: Monte Fantasia M.D.    On: 04/17/2019 05:35   Vas US Carotid  Result Date: 04/17/2019 Carotid Arterial Duplex Study Indications:       CVA. Comparison Study:  Prior study done 06/09/18 Performing Technologist: Abram Sander RVS  Examination Guidelines: A complete evaluation includes B-mode imaging, spectral Doppler, color Doppler, and power Doppler as needed of all accessible portions of each vessel. Bilateral testing is considered an integral part of a complete examination. Limited examinations for reoccurring indications may be performed as noted.  Right Carotid Findings: +----------+--------+--------+--------+-----------+--------+           PSV cm/sEDV cm/sStenosisDescribe   Comments +----------+--------+--------+--------+-----------+--------+ CCA Prox  60      17              homogeneous         +----------+--------+--------+--------+-----------+--------+ CCA Distal58      18              homogeneous         +----------+--------+--------+--------+-----------+--------+ ICA Prox  57      25      1-39%   homogeneous         +----------+--------+--------+--------+-----------+--------+ ICA Distal51  26                                  +----------+--------+--------+--------+-----------+--------+ ECA       47      7                                   +----------+--------+--------+--------+-----------+--------+ +----------+--------+-------+--------+-------------------+           PSV cm/sEDV cmsDescribeArm Pressure (mmHG) +----------+--------+-------+--------+-------------------+ [id number]                                         +----------+--------+-------+--------+-------------------+ +---------+--------+--+--------+--+---------+ VertebralPSV cm/s21EDV cm/s10Antegrade +---------+--------+--+--------+--+---------+  Left Carotid Findings: +----------+--------+--------+--------+-----------+--------+           PSV cm/sEDV cm/sStenosisDescribe   Comments  +----------+--------+--------+--------+-----------+--------+ CCA Prox  104     19              homogeneous         +----------+--------+--------+--------+-----------+--------+ CCA Distal53      16              homogeneous         +----------+--------+--------+--------+-----------+--------+ ICA Prox  32      12      1-39%   homogeneous         +----------+--------+--------+--------+-----------+--------+ ICA Distal63      23                                  +----------+--------+--------+--------+-----------+--------+ ECA       63      7                                   +----------+--------+--------+--------+-----------+--------+ +----------+--------+--------+--------+-------------------+ SubclavianPSV cm/sEDV cm/sDescribeArm Pressure (mmHG) +----------+--------+--------+--------+-------------------+           103                                         +----------+--------+--------+--------+-------------------+ +---------+--------+--+--------+--+---------+ VertebralPSV cm/s42EDV cm/s18Antegrade +---------+--------+--+--------+--+---------+  Summary: Right Carotid: Velocities in the right ICA are consistent with a 1-39% stenosis. Left Carotid: Velocities in the left ICA are consistent with a 1-39% stenosis. Vertebrals: Bilateral vertebral arteries demonstrate antegrade flow. *See table(s) above for measurements and observations.     Preliminary     PHYSICAL EXAM  Temp:  [97.8 F (36.6 C)-98.5 F (36.9 C)] 97.8 F (36.6 C) (07/14 1226) Pulse Rate:  [62-84] 84 (07/14 1226) Resp:  [14-19] 17 (07/14 1226) BP: (95-131)/(65-93) 131/93 (07/14 1226) SpO2:  [97 %-100 %] 100 % (07/14 1226) Weight:  [90.3 kg] 90.3 kg (07/13 2153)  General - Well nourished, well developed, in no apparent distress.  Ophthalmologic - fundi not visualized due to noncooperation.  Cardiovascular - Regular rate and rhythm.  Mental Status -  Level of arousal and orientation to  time, place, and person were intact. Language including expression, naming, repetition, comprehension was assessed and found intact. However, pt does have scanning speech with monotone. Fund of Knowledge was assessed and was intact.  Cranial Nerves II - XII -  II - Visual field intact OU. III, IV, VI - Extraocular movements intact. V - Facial sensation intact bilaterally. VII - Facial movement intact bilaterally. VIII - Hearing & vestibular intact bilaterally. X - Palate elevates symmetrically. XI - Chin turning & shoulder shrug intact bilaterally. XII - Tongue protrusion intact.  Motor Strength - The patient's strength was symmetrical in all extremities and pronator drift was absent.  Bulk was normal and fasciculations were absent.   Motor Tone - Muscle tone was assessed at the neck and appendages and was normal.  Reflexes - The patient's reflexes were symmetrical in all extremities and he had no pathological reflexes.  Sensory - Light touch, temperature/pinprick were assessed and were symmetrical.    Coordination - The patient had normal movements in the hands and feet with no ataxia or dysmetria.  Tremor was absent.  Gait and Station - deferred.   ASSESSMENT/PLAN Mr. [name] Love is a 48 y.o. 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   Stroke: Subacute right cerebellar infarcts most likely related to vasculopathy due to brain radiation secondary to ependymoma   MRI without contrast 7/14 no acute infarct.  Post surgical resection posterior fossa tumor with no recurrent tumor.    MRI with contrast - cluster of acute tiny infarcts right cerebellum progressed between June 11, 2018 and April 16, 2019.  Premature small vessel disease related to radiation.  Chronic dural thickening due to  shunting.  MRA right SCA occlusion, possibly due to subacute infarction.   Carotid Doppler  B ICA 1-39% stenosis, VAs antegrade   2D Echo EF 60-65 %. No source of embolus   LDL 64  HgbA1c 5.3  Lovenox 40 mg subcu daily for VTE prophylaxis  aspirin 81 mg daily prior to admission, now on aspirin 81 mg daily and clopidogrel 75 mg daily.  Continue DAPT x3 weeks then Plavix alone  Therapy recommendations: CIR  Disposition: Pending  Followed by Dr. Jannifer Franklin as an outpatient.  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 Christella Noa with multiple revisions, resultant seizures   S/p 62 sessions of brain radiation  MRI with contrast no tumor recurrence  Hearing loss, gait difficulty, dysarthria as sequelae from it.  Dr. Trenton Gammon has reprogrammed the shunt after MRI and MRA  Follow up with Dr. Christella Noa as outpt  History of previous stroke  06/2018 subacute left thalamic lacunar infarct as well as left occipital punctate infarct. Most likely related to pt hx of brain radiation in the past.  Treated with DAPT x3 weeks then aspirin alone. 30 day cardiac event monitoring neg for afib,  History of previous lacune's per imaging  Following with Dr. Jannifer Franklin at [facility]  History of seizure  On lamictal 100 bid and topamax 100 q hs  Saw Dr. Jannifer Franklin in 03/2019 - continued on topamax  Continue topamax and follow up with Dr. Jannifer Franklin  Seizure prevention  Hypertension  Stable . BP goal normotensive  Hyperlipidemia  Home meds: No statin  Now on Lipitor 80  LDL 64, goal < 70  LDL at goal, statin not indicated.  Will discontinue  Other Stroke Risk Factors  Obesity, Body mass index is 30.27 kg/m.,  Weight loss improving on CPAP, continue to recommend weight loss, diet and exercise as appropriate  Obstructive sleep apnea on CPAP  Other Active Problems  Elevated creatinine  Anxiety-on Abilify and  alprazolam  Hypothyroidism  Hospital day # 0  Neurology will sign off. Please call with questions. Pt will follow up with Dr. Jannifer Franklin at [facility] in about 4 weeks. Thanks for the consult.  Rosalin Hawking, MD PhD Stroke Neurology 04/17/2019 2:59 PM     To contact Stroke Continuity provider, please refer to [email or web address] After hours, contact General Neurology

## 2019-04-17 NOTE — Progress Notes (Signed)
Rehab Admissions Coordinator Note:  Per PT recommendation, this patient was screened by Jhonnie Garner for appropriateness for an Inpatient Acute Rehab Consult.  Noted pt is observation status at this time. Pt may not have the medical necessity to warrant an inpatient rehab stay if they remain observation. If status were to change to inpatient, [facility] will screen for candidacy.   Jhonnie Garner 04/17/2019, 10:12 AM  I can be reached at [phone].

## 2019-04-17 NOTE — Op Note (Signed)
Patient with indwelling Codman programmable valve.  Patient status post MRI scan for stroke work-up.  I have been asked to reprogram his valve.  Using the Codman system I palpated his valve.  Pumps and refills easily.  I oriented the valve controller and selected a pressure of 140.  Programming was then performed without difficulty.  This was run again and confirmed.  No complications.  Follow-up with Dr. Christella Noa as an outpatient.

## 2019-04-17 NOTE — H&P (Addendum)
History and Physical    [name] Love DTO:[id number] DOB: May 24, 1971 DOA: 04/16/2019  PCP: Isaias Sakai, DO  Patient coming from: Home.  Chief Complaint: Word finding difficulties.  HPI: [name] Love is a 48 y.o. 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  Also had some numbness of the lower extremities.  Denies any headache visual symptoms or any weakness of the extremities.  ED Course: In the ER patient's MRI was negative for any acute changes.  Neurology was consulted and patient admitted for further management.  At this time I have discussed with neurologist with plan to get MRI brain with contrast and also B12 levels.  Review of Systems: As per HPI, rest all negative.   Past Medical History:  Diagnosis Date  . Abnormal gait   . Allergic rhinitis   . Anxiety   . Bilateral arm weakness   . Brain cancer ([place])   . Depression   . Ependymoma of brain ([place])   . Hearing loss, sensorineural   . Hemiparesis and alteration of sensations as late effects of stroke ([place]) 09/04/2018  . Hypothyroidism   . Insomnia   . Left leg weakness   . Low vitamin B12 level   . Seizures ([place])     Past Surgical History:  Procedure Laterality Date  . BRAIN SURGERY    . HERNIA REPAIR    . SHUNT REVISION       reports that he has never smoked. He has never used smokeless tobacco. He reports that he does not drink alcohol or use drugs.  Allergies  Allergen Reactions  . Baclofen     Urinary retension  . Seroquel [Quetiapine]     Weight gain  . Sulfa Antibiotics   . Coconut Flavor Rash    ANYTHING COCONUT  . Contrast Media [Iodinated Diagnostic Agents] Rash    Family History  Problem Relation Age of Onset  . Brain cancer Mother   . High blood pressure Mother     Prior to Admission medications   Medication Sig Start Date End Date Taking? Authorizing  Provider  ALPRAZolam Duanne Moron) 0.5 MG tablet Take 0.5 mg by mouth 2 (two) times daily. 05/30/18  Yes [provider]  aspirin EC 81 MG EC tablet Take 1 tablet (81 mg total) by mouth daily. 06/12/18  Yes Purohit, Konrad Dolores, MD  topiramate (TOPAMAX) 50 MG tablet TAKE 1 TABLET BY MOUTH IN THE MORNING AND 2 TABLETS  IN THE EVENING Patient taking differently: Take 50-100 mg by mouth See admin instructions. TAKE 1 TABLET BY MOUTH IN THE MORNING AND 2 TABLETS  IN THE EVENING 03/28/19  Yes Kathrynn Ducking, MD    Physical Exam: Constitutional: Moderately built and nourished. Vitals:   04/16/19 2030 04/16/19 2136 04/16/19 2153 04/16/19 2353  BP: 103/84  127/84 95/69  Pulse: 72  64 62  Resp:   18 18  Temp:   98 F (36.7 C) 98.2 F (36.8 C)  TempSrc:   Oral Oral  SpO2: 97%  99% 99%  Weight:   90.3 kg   Height:  5\' 8"  (1.727 m) 5\' 8"  (1.727 m)    Eyes: Anicteric no pallor. ENMT: No discharge from the ears eyes nose or mouth. Neck: No mass or.  No neck rigidity. Respiratory: No rhonchi or crepitations. Cardiovascular: S1-S2 heard. Abdomen: Soft nontender bowel sounds present. Musculoskeletal: No  edema. Skin: No rash. Neurologic: Alert awake oriented to time place and person.  Moves all extremities 5 x 5.  No facial asymmetry tongue is midline. Psychiatric: Appears normal per normal affect.   Labs on Admission: I have personally reviewed following labs and imaging studies  CBC: Recent Labs  Lab 04/16/19 1202 04/16/19 1227  WBC 6.0  --   NEUTROABS 4.0  --   HGB 16.5 16.3  HCT 50.0 48.0  MCV 92.4  --   PLT 239  --    Basic Metabolic Panel: Recent Labs  Lab 04/16/19 1202 04/16/19 1227  NA 140 141  K 3.7 3.6  CL 113* 109  CO2 19*  --   GLUCOSE 92 88  BUN 10 10  CREATININE 1.40* 1.30*  CALCIUM 9.0  --    GFR: Estimated Creatinine Clearance: 78.4 mL/min (A) (by C-G formula based on SCr of 1.3 mg/dL (H)). Liver Function Tests: Recent Labs  Lab 04/16/19 1202  AST 22   ALT 29  ALKPHOS 78  BILITOT 1.0  PROT 6.7  ALBUMIN 4.0   No results for input(s): LIPASE, AMYLASE in the last 168 hours. No results for input(s): AMMONIA in the last 168 hours. Coagulation Profile: Recent Labs  Lab 04/16/19 1202  INR 1.1   Cardiac Enzymes: No results for input(s): CKTOTAL, CKMB, CKMBINDEX, TROPONINI in the last 168 hours. BNP (last 3 results) No results for input(s): PROBNP in the last 8760 hours. HbA1C: No results for input(s): HGBA1C in the last 72 hours. CBG: Recent Labs  Lab 04/16/19 1156 04/16/19 1231  GLUCAP 87 87   Lipid Profile: No results for input(s): CHOL, HDL, LDLCALC, TRIG, CHOLHDL, LDLDIRECT in the last 72 hours. Thyroid Function Tests: No results for input(s): TSH, T4TOTAL, FREET4, T3FREE, THYROIDAB in the last 72 hours. Anemia Panel: No results for input(s): VITAMINB12, FOLATE, FERRITIN, TIBC, IRON, RETICCTPCT in the last 72 hours. Urine analysis:    Component Value Date/Time   COLORURINE Yellow 11/13/2012 1300   APPEARANCEUR Clear 11/13/2012 1300   LABSPEC 1.020 11/13/2012 1300   PHURINE 5.0 11/13/2012 1300   GLUCOSEU Negative 11/13/2012 1300   HGBUR Negative 11/13/2012 1300   BILIRUBINUR Negative 11/13/2012 1300   KETONESUR Negative 11/13/2012 1300   PROTEINUR Negative 11/13/2012 1300   NITRITE Negative 11/13/2012 1300   LEUKOCYTESUR Negative 11/13/2012 1300   Sepsis Labs: @LABRCNTIP (procalcitonin:4,lacticidven:4) )No results found for this or any previous visit (from the past 240 hour(s)).   Radiological Exams on Admission: Mr Brain Wo Contrast  Result Date: 04/16/2019 CLINICAL DATA:  Ataxia, rule out stroke. History of brain tumor with surgery and radiation. Shunt. EXAM: MRI HEAD WITHOUT CONTRAST TECHNIQUE: Multiplanar, multiecho pulse sequences of the brain and surrounding structures were obtained without intravenous contrast. COMPARISON:  CT head 06/07/2018.  MRI 06/11/2018 FINDINGS: Brain: Occipital craniotomy for  posterior fossa tumor resection per history. Extensive calcification in the cerebellar hemispheres bilaterally and in the pons best seen on CT. This is chronic and most likely due to prior radiation change. Fourth ventricle is chronically dilated likely due to prior surgery. The third and lateral ventricles are nondilated. Right parietal shunt catheter crosses the midline into the left lateral ventricle unchanged from the prior study. Negative for acute infarct. Patchy white matter changes bilaterally may be due to radiation change and or chronic small vessel ischemia. Chronic lacunar infarctions in the thalamus bilaterally unchanged. Small extra-axial CSF collections stable from the prior study. No midline shift. Vascular: Normal arterial flow voids Skull and upper  cervical spine: Occipital craniotomy. No acute skeletal abnormality. Sinuses/Orbits: Negative Other: None IMPRESSION: Negative for acute infarct Post surgical resection of posterior fossa tumor. No recurrent tumor. No change from the prior studies. Electronically Signed   By: Franchot Gallo M.D.   On: 04/16/2019 18:42    EKG: Independently reviewed.  Normal sinus rhythm.  Assessment/Plan Active Problems:   Weakness   Episode of change in speech   Weakness generalized    1. Word finding difficulties with numbness of the lower extremities -discussed with Dr. Cheral Marker on-call neurologist plan to get MRI brain with contrast and also B12 levels.  Following which we will have further plans.  Patient VP shunt will need to be reprogrammed. 2. History of ependymoma status post resection with VP shunt.  Patient's VP shunt will need to be reprogrammed for which neurosurgery consult will be required. 3. Elevated creatinine but normal GFR.  Closely follow metabolic panel. 4. On Topamax for seizures. 5. History of previous stroke on aspirin.  Addendum -patient's MRI brain with contrast shows subacute stroke.  Discussed with Dr. Demetra Shiner who  advised to get MRA head carotid Doppler 2D echo.  Patient passed swallow.  Patient will be on neurochecks telemetry and on aspirin and Lipitor.  Check hemoglobin A1c lipid panel and physical therapy consult.  DVT prophylaxis: Lovenox. Code Status: Full code. Family Communication: Discussed with patient. Disposition Plan: To be determined. Consults called: Neurology. Admission status: Observation   Rise Patience MD [facility] Pager [phone].  If 7PM-7AM, please contact night-coverage [email or web address] Password [facility]  04/17/2019, 2:47 AM

## 2019-04-17 NOTE — Progress Notes (Signed)
Inpatient Rehab Admissions:  Inpatient Rehab Consult received.  I met with pt at the bedside for rehabilitation assessment and to discuss goals and expectations of an inpatient rehab admission.  Pt interested in CIR program. Feel he would be able to reach a Mod I/Supervision level after short CIR stay and be able to return home safely. Did discuss that pt is currently observation status and that his insurance would need to approve after medical review. Will follow up with pt in the morning to see if he would like AC to pursue insurance authorization process for possible admit. AC left CIR brochures and pamphlet information at the bedside.   Please call if questions.   Jhonnie Garner, OTR/L  Rehab Admissions Coordinator  [phone] 04/17/2019 5:38 PM

## 2019-04-17 NOTE — Evaluation (Signed)
Speech Language Pathology Evaluation Patient Details Name: [name] Love MRN: [id number] DOB: 1970/11/15 Today's Date: 04/17/2019 Time: 5726-2035 SLP Time Calculation (min) (ACUTE ONLY): 25 min  Problem List:  Patient Active Problem List   Diagnosis Date Noted  . Episode of change in speech 04/17/2019  . Weakness generalized 04/17/2019  . Weakness 04/16/2019  . Generalized anxiety disorder 11/28/2018  . OSA on CPAP 11/28/2018  . Hemiparesis and alteration of sensations as late effects of stroke ([place]) 09/04/2018  . Acute arterial ischemic stroke, vertebrobasilar, thalamic ([place]) 06/08/2018  . Seizure ([place])   . Recurrent falls   . Diplopia   . Acute CVA (cerebrovascular accident) ([place]) 06/07/2018  . Allergic rhinitis 04/03/2018  . Atypical chest pain 11/30/2017  . Dyspnea on exertion 11/30/2017  . Myalgia 04/18/2017  . Seizures ([place]) 01/20/2017   Past Medical History:  Past Medical History:  Diagnosis Date  . Abnormal gait   . Allergic rhinitis   . Anxiety   . Bilateral arm weakness   . Brain cancer ([place])   . Depression   . 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 Hearing loss, sensorineural   . Hemiparesis and alteration of sensations as late effects of stroke ([place]) 09/04/2018  . Hypothyroidism   . Insomnia   . Left leg weakness   . Low vitamin B12 level   . Seizures ([place])    Past Surgical History:  Past Surgical History:  Procedure Laterality Date  . BRAIN SURGERY    . HERNIA REPAIR    . SHUNT REVISION     HPI:  Pt is a 48 y.o. 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 MRI of the brain revealed cluster of non acute infarcts in the right cerebellum.    Assessment / Plan / Recommendation Clinical Impression  Pt reported that he has had difficulty with memory and complex problem solving since the ependymoma in 1995  and has been on disability due to these difficulties since 2005. He reported that his speech is now slurred and that his rate of speech is notably reduced compared to his baseline with a return of approximately 50%. Pt presented with cognitive-linguistic deficits related to attention, memory and complex problem solving which the pt indicated was representative of his baseline. His language skills were within functional limits but he exhibited difficulty with auditory comprehension of more complex information which may be due to the noted memory impairments. He presented with mild-moderate dysarthria characterized by imprecise articulation, hyponasality, and impaired prosody. Skilled SLP services are clinically indicated at this time to improve dysarthria.     SLP Assessment  SLP Recommendation/Assessment: Patient needs continued Speech Lanaguage Pathology Services SLP Visit Diagnosis: Dysarthria and anarthria (R47.1)    Follow Up Recommendations  Inpatient Rehab    Frequency and Duration min 2x/week  2 weeks      SLP Evaluation Cognition  Overall Cognitive Status: History of cognitive impairments - at baseline Arousal/Alertness: Awake/alert Orientation Level: Oriented X4 Attention: Focused;Sustained Focused Attention: Impaired Focused Attention Impairment: Verbal complex Memory: Impaired Memory Impairment: Storage deficit;Retrieval deficit;Decreased recall of new information(Immediate: 3/3; delayed: 1/3) Awareness: Appears intact Problem Solving: Impaired Problem Solving Impairment: Verbal complex       Comprehension  Auditory Comprehension Overall Auditory Comprehension: Impaired at baseline Yes/No Questions: Impaired Basic Biographical Questions: (5/5 ) Complex Questions: (2/5) Paragraph Comprehension (via  yes/no questions): (3/5) Commands: Impaired Two Step Basic Commands: (3/4) Multistep Basic Commands: (2/4) Interfering Components: Processing speed;Working  Marine scientist;Attention Visual Recognition/Discrimination Discrimination: Within Function Limits    Expression Expression Primary Mode of Expression: Verbal Verbal Expression Overall Verbal Expression: Impaired Initiation: No impairment Automatic Speech: Counting;Day of week(Days: 7/7; Counting: 10/10; Months: 2/12 ) Level of Generative/Spontaneous Verbalization: Sentence Repetition: No impairment Naming: No impairment Pragmatics: No impairment   Oral / Motor  Oral Motor/Sensory Function Overall Oral Motor/Sensory Function: Mild impairment Facial ROM: Reduced right;Suspected CN VII (facial) dysfunction Facial Symmetry: Within Functional Limits Facial Strength: Within Functional Limits Facial Sensation: Within Functional Limits Lingual ROM: Within Functional Limits Lingual Symmetry: Within Functional Limits Lingual Strength: Reduced;Suspected CN XII (hypoglossal) dysfunction Lingual Sensation: Within Functional Limits Motor Speech Overall Motor Speech: Impaired Respiration: Within functional limits Phonation: Normal Resonance: Hyponasality Articulation: Within functional limitis Intelligibility: Intelligibility reduced Word: 75-100% accurate Phrase: 75-100% accurate Sentence: 75-100% accurate Conversation: 75-100% accurate Motor Planning: Within functional limits Motor Speech Errors: Aware;Consistent   Tinnie Kunin I. Hardin Negus, [place], [place] Office number [phone] Pager [place] 04/17/2019, 1:44 PM

## 2019-04-17 NOTE — Progress Notes (Signed)
  Speech Language Pathology Treatment: Cognitive-Linquistic(Dysarthria)  Patient Details Name: [name] Love MRN: [id number] DOB: May 21, 1971 Today's Date: 04/17/2019 Time: 2836-6294 SLP Time Calculation (min) (ACUTE ONLY): 28 min  Assessment / Plan / Recommendation Clinical Impression  Pt was seen for dysarthria treatment and was cooperative during the session. He was educated regarding the nature of dysarthria, and compensatory strategies to improve speech intellegibility. Dysarthria handout was not given due to pt's reported blurry vision which impaired his ability to read even enlarged print but he verbalized understanding regarding all areas of education. He used compensatory strategies at the phrase level with 100% accuracy. He required minimal-moderate cues at the sentence level for use of a more functional rate and overaticulation. SLP will continue to follow pt.     HPI HPI: Pt is a 48 y.o. 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 MRI of the brain revealed cluster of non acute infarcts in the right cerebellum.       SLP Plan  Continue with current plan of care  Patient needs continued Speech Lanaguage Pathology Services    Recommendations                   Follow up Recommendations: Inpatient Rehab SLP Visit Diagnosis: Dysarthria and anarthria (R47.1) Plan: Continue with current plan of care       Alisabeth Selkirk I. Hardin Negus, [place], [place] Office number [phone] Pager [place] 04/17/2019, 1:58 PM

## 2019-04-18 DIAGNOSIS — R2990 Unspecified symptoms and signs involving the nervous system: Secondary | ICD-10-CM

## 2019-04-18 DIAGNOSIS — Z6830 Body mass index (BMI) 30.0-30.9, adult: Secondary | ICD-10-CM | POA: Diagnosis not present

## 2019-04-18 DIAGNOSIS — I69398 Other sequelae of cerebral infarction: Secondary | ICD-10-CM | POA: Diagnosis not present

## 2019-04-18 DIAGNOSIS — F419 Anxiety disorder, unspecified: Secondary | ICD-10-CM | POA: Diagnosis present

## 2019-04-18 DIAGNOSIS — Z1159 Encounter for screening for other viral diseases: Secondary | ICD-10-CM | POA: Diagnosis not present

## 2019-04-18 DIAGNOSIS — R69 Illness, unspecified: Secondary | ICD-10-CM | POA: Diagnosis not present

## 2019-04-18 DIAGNOSIS — Z91041 Radiographic dye allergy status: Secondary | ICD-10-CM | POA: Diagnosis not present

## 2019-04-18 DIAGNOSIS — G4733 Obstructive sleep apnea (adult) (pediatric): Secondary | ICD-10-CM | POA: Diagnosis present

## 2019-04-18 DIAGNOSIS — E785 Hyperlipidemia, unspecified: Secondary | ICD-10-CM | POA: Diagnosis present

## 2019-04-18 DIAGNOSIS — I6340 Cerebral infarction due to embolism of unspecified cerebral artery: Secondary | ICD-10-CM | POA: Diagnosis present

## 2019-04-18 DIAGNOSIS — Z923 Personal history of irradiation: Secondary | ICD-10-CM | POA: Diagnosis not present

## 2019-04-18 DIAGNOSIS — Z85841 Personal history of malignant neoplasm of brain: Secondary | ICD-10-CM | POA: Diagnosis not present

## 2019-04-18 DIAGNOSIS — Z7982 Long term (current) use of aspirin: Secondary | ICD-10-CM | POA: Diagnosis not present

## 2019-04-18 DIAGNOSIS — Z982 Presence of cerebrospinal fluid drainage device: Secondary | ICD-10-CM | POA: Diagnosis not present

## 2019-04-18 DIAGNOSIS — R209 Unspecified disturbances of skin sensation: Secondary | ICD-10-CM | POA: Diagnosis present

## 2019-04-18 DIAGNOSIS — Z882 Allergy status to sulfonamides status: Secondary | ICD-10-CM | POA: Diagnosis not present

## 2019-04-18 DIAGNOSIS — E669 Obesity, unspecified: Secondary | ICD-10-CM | POA: Diagnosis present

## 2019-04-18 DIAGNOSIS — I639 Cerebral infarction, unspecified: Secondary | ICD-10-CM

## 2019-04-18 DIAGNOSIS — R531 Weakness: Secondary | ICD-10-CM | POA: Diagnosis not present

## 2019-04-18 DIAGNOSIS — Z79899 Other long term (current) drug therapy: Secondary | ICD-10-CM | POA: Diagnosis not present

## 2019-04-18 DIAGNOSIS — H903 Sensorineural hearing loss, bilateral: Secondary | ICD-10-CM | POA: Diagnosis present

## 2019-04-18 DIAGNOSIS — I69359 Hemiplegia and hemiparesis following cerebral infarction affecting unspecified side: Secondary | ICD-10-CM | POA: Diagnosis not present

## 2019-04-18 DIAGNOSIS — R471 Dysarthria and anarthria: Secondary | ICD-10-CM | POA: Diagnosis present

## 2019-04-18 DIAGNOSIS — R4789 Other speech disturbances: Secondary | ICD-10-CM | POA: Diagnosis not present

## 2019-04-18 DIAGNOSIS — R200 Anesthesia of skin: Secondary | ICD-10-CM | POA: Diagnosis present

## 2019-04-18 DIAGNOSIS — E039 Hypothyroidism, unspecified: Secondary | ICD-10-CM | POA: Diagnosis present

## 2019-04-18 DIAGNOSIS — G40909 Epilepsy, unspecified, not intractable, without status epilepticus: Secondary | ICD-10-CM | POA: Diagnosis present

## 2019-04-18 DIAGNOSIS — I10 Essential (primary) hypertension: Secondary | ICD-10-CM | POA: Diagnosis present

## 2019-04-18 DIAGNOSIS — Z888 Allergy status to other drugs, medicaments and biological substances status: Secondary | ICD-10-CM | POA: Diagnosis not present

## 2019-04-18 HISTORY — DX: Cerebral infarction, unspecified: I63.9

## 2019-04-18 MED ORDER — CYANOCOBALAMIN 1000 MCG/ML IJ SOLN
1000.0000 ug | Freq: Once | INTRAMUSCULAR | Status: AC
  Administered 2019-04-18: 1000 ug via INTRAMUSCULAR
  Filled 2019-04-18: qty 1

## 2019-04-18 NOTE — Evaluation (Signed)
Occupational Therapy Evaluation Patient Details Name: [name] Love MRN: [id number] DOB: Oct 17, 1970 Today's Date: 04/18/2019    History of Present Illness Patient is a 48 y/o male presenting to the ED on 04/16/2019 with primary complaints of weakness and word finding difficulties. MRI revealing subacute infarct at right cerebellum. Past history of brain tumor resection in his posterior fossa, in addition to programmable shunt placement, seizures, hearing loss, anxiety.   Clinical Impression   Patient presenting with decreased I in self care, functional mobility/transfers, balance, coordination, strength, and safety awareness. Patient reports being mod I PTA with rollator.Pt lives with Aunt who he reports, " needs help herself sometimes." Patient currently functioning at min A overall . Patient will benefit from acute OT to increase overall independence in the areas of ADLs, functional mobility, and safety awareness in order to safely discharge to next venue of care. Pt would benefit from CIR admission in order to discharge home at Mod I /S level.    Follow Up Recommendations  Supervision - Intermittent;CIR    Equipment Recommendations  Other (comment)(defer to next venue of care)    Recommendations for Other Services Rehab consult     Precautions / Restrictions Precautions Precautions: Fall Restrictions Weight Bearing Restrictions: No      Mobility Bed Mobility Overal bed mobility: Modified Independent         Transfers Overall transfer level: Needs assistance Equipment used: Rolling walker (2 wheeled) Transfers: Sit to/from Stand Sit to Stand: Min assist         General transfer comment: Min A to stand at bedside and for initial standing balance    Balance Overall balance assessment: Needs assistance Sitting-balance support: No upper extremity supported;Feet supported Sitting balance-Leahy Scale: Good     Standing balance support: Bilateral upper extremity  supported;During functional activity Standing balance-Leahy Scale: Poor         ADL either performed or assessed with clinical judgement   ADL Overall ADL's : Needs assistance/impaired Eating/Feeding: Modified independent   Grooming: Wash/dry hands;Wash/dry face;Oral care;Standing;Min guard   Upper Body Bathing: Sitting;Set up   Lower Body Bathing: Minimal assistance;Sit to/from stand   Upper Body Dressing : Set up;Sitting   Lower Body Dressing: Minimal assistance;Sit to/from stand   Toilet Transfer: Minimal assistance;RW             General ADL Comments: min cuing for safety and proper technique     Vision   Additional Comments: Pt wears prism glasses for double vision. Does not report double vision during assessment            Pertinent Vitals/Pain Pain Assessment: Faces Faces Pain Scale: Hurts a little bit Pain Location: LEs Pain Descriptors / Indicators: Burning Pain Intervention(s): Monitored during session;Repositioned     Hand Dominance Left   Extremity/Trunk Assessment Upper Extremity Assessment Upper Extremity Assessment: Generalized weakness;RUE deficits/detail RUE Deficits / Details: 4/5 RUE Sensation: WNL RUE Coordination: decreased fine motor   Lower Extremity Assessment Lower Extremity Assessment: Defer to PT evaluation   Cervical / Trunk Assessment Cervical / Trunk Assessment: Normal   Communication Communication Communication: HOH   Cognition Arousal/Alertness: Awake/alert Behavior During Therapy: WFL for tasks assessed/performed Overall Cognitive Status: History of cognitive impairments - at baseline          General Comments: HOH - has B hearing aides              Home Living Family/patient expects to be discharged to:: Private residence Living Arrangements: Other relatives Available Help at  Discharge: Family;Available PRN/intermittently Type of Home: House Home Access: Stairs to enter [employer] of Steps:  1 Entrance Stairs-Rails: None Home Layout: One level     Bathroom Shower/Tub: Teacher, early years/pre: Standard     Home Equipment: Environmental consultant - 2 wheels;Walker - 4 wheels;Shower seat;Bedside commode   Additional Comments: lives with his aunt and he occasionally helps her as well.  Lives With: Other (Comment)(aunt)    Prior Functioning/Environment Level of Independence: Independent with assistive device(s)        Comments: uses rollator at all times; has prism glasses, however still has some double vision        OT Problem List: Decreased strength;Decreased coordination;Pain;Decreased cognition;Decreased activity tolerance;Decreased safety awareness;Impaired balance (sitting and/or standing);Decreased knowledge of use of DME or AE;Impaired vision/perception;Decreased knowledge of precautions      OT Treatment/Interventions: Self-care/ADL training;Therapeutic exercise;Therapeutic activities;Neuromuscular education;Cognitive remediation/compensation;Energy conservation;DME and/or AE instruction;Patient/family education;Modalities;Manual therapy;Balance training    OT Goals(Current goals can be found in the care plan section) Acute Rehab OT Goals Patient Stated Goal: get stronger, improve balance OT Goal Formulation: With patient Time For Goal Achievement: 05/02/19 Potential to Achieve Goals: Good ADL Goals Pt Will Perform Grooming: with modified independence Pt Will Perform Upper Body Bathing: with supervision Pt Will Perform Lower Body Bathing: with supervision Pt Will Perform Upper Body Dressing: with supervision Pt Will Perform Lower Body Dressing: with supervision Pt Will Transfer to Toilet: with supervision Pt Will Perform Toileting - Clothing Manipulation and hygiene: with supervision Pt Will Perform Tub/Shower Transfer: with supervision  OT Frequency: Min 2X/week   Barriers to D/C: (none known at this time)             AM-PAC OT "6 Clicks" Daily  Activity     Outcome Measure Help from another person eating meals?: None Help from another person taking care of personal grooming?: A Little Help from another person toileting, which includes using toliet, bedpan, or urinal?: A Little Help from another person bathing (including washing, rinsing, drying)?: A Little Help from another person to put on and taking off regular upper body clothing?: A Little Help from another person to put on and taking off regular lower body clothing?: A Little 6 Click Score: 19   End of Session Equipment Utilized During Treatment: Rolling walker Nurse Communication: Mobility status  Activity Tolerance: Patient tolerated treatment well Patient left: in bed;with call bell/phone within reach;with bed alarm set  OT Visit Diagnosis: Muscle weakness (generalized) (M62.81);Hemiplegia and hemiparesis Hemiplegia - Right/Left: Right Hemiplegia - dominant/non-dominant: Non-Dominant                Time: 1157-2620 OT Time Calculation (min): 19 min Charges:  OT General Charges $OT Visit: 1 Visit OT Evaluation $OT Eval Low Complexity: 1 Low   Jaelie Aguilera P, MS, OTR/L 04/18/2019, 11:06 AM

## 2019-04-18 NOTE — Progress Notes (Signed)
Inpatient Rehabilitation-Admissions Coordinator   Pt still interested in CIR program. AC will begin insurance authorization process for possible admit once OT evaluation complete.   Please call if questions.   Jhonnie Garner, OTR/L  Rehab Admissions Coordinator  [phone] 04/18/2019 10:45 AM

## 2019-04-18 NOTE — Progress Notes (Signed)
Physical Therapy Treatment Patient Details Name: [name] Love MRN: [id number] DOB: [birth year] Today's Date: 04/18/2019    History of Present Illness Patient is a 48 y/o male presenting to the ED on 04/16/2019 with primary complaints of weakness and word finding difficulties. MRI revealing subacute infarct at right cerebellum. Past history of brain tumor resection in his posterior fossa, in addition to programmable shunt placement, seizures, hearing loss, anxiety.    PT Comments    Patient seen for mobility progression. Pt is pleasant and agreeable to therapy. Pt asking frequently about getting insurance auth for CIR during session. Pt requires min/mod A for gait training with RW.  Current plan remains appropriate.   Follow Up Recommendations  CIR     Equipment Recommendations  None recommended by PT    Recommendations for Other Services       Precautions / Restrictions Precautions Precautions: Fall Restrictions Weight Bearing Restrictions: No    Mobility  Bed Mobility Overal bed mobility: Modified Independent                Transfers Overall transfer level: Needs assistance Equipment used: Rolling walker (2 wheeled) Transfers: Sit to/from Stand Sit to Stand: Min assist         General transfer comment: assist to steady and to power up into standing from EOB  Ambulation/Gait Ambulation/Gait assistance: Min assist;Mod assist Gait Distance (Feet): 160 Feet Assistive device: Rolling walker (2 wheeled) Gait Pattern/deviations: Step-through pattern;Decreased stride length;Trunk flexed;Decreased dorsiflexion - right;Decreased dorsiflexion - left;Drifts right/left(decreased heel strike B ) Gait velocity: decreased   General Gait Details: pt with bilat LE weakness and poor coordination with assist for balance and safe use of RW; pt reports R LE weakness > L however demonstrate bilat weakness and tendency for slight L knee hyperextension during stance phase   Stairs              Wheelchair Mobility    Modified Rankin (Stroke Patients Only)       Balance Overall balance assessment: Needs assistance Sitting-balance support: No upper extremity supported;Feet supported Sitting balance-Leahy Scale: Good     Standing balance support: Bilateral upper extremity supported;During functional activity Standing balance-Leahy Scale: Poor                              Cognition Arousal/Alertness: Awake/alert Behavior During Therapy: WFL for tasks assessed/performed Overall Cognitive Status: History of cognitive impairments - at baseline                                 General Comments: HOH - has B hearing aides      Exercises      General Comments        Pertinent Vitals/Pain Pain Assessment: Faces Faces Pain Scale: No hurt    Home Living                      Prior Function            PT Goals (current goals can now be found in the care plan section) Acute Rehab PT Goals Patient Stated Goal: get stronger, improve balance Progress towards PT goals: Progressing toward goals    Frequency    Min 4X/week      PT Plan Current plan remains appropriate    Co-evaluation  AM-PAC PT "6 Clicks" Mobility   Outcome Measure  Help needed turning from your back to your side while in a flat bed without using bedrails?: A Little Help needed moving from lying on your back to sitting on the side of a flat bed without using bedrails?: A Little Help needed moving to and from a bed to a chair (including a wheelchair)?: A Little Help needed standing up from a chair using your arms (e.g., wheelchair or bedside chair)?: A Little Help needed to walk in hospital room?: A Lot Help needed climbing 3-5 steps with a railing? : A Lot 6 Click Score: 16    End of Session Equipment Utilized During Treatment: Gait belt Activity Tolerance: Patient tolerated treatment well Patient left: with call  bell/phone within reach;in chair;with chair alarm set Nurse Communication: Mobility status PT Visit Diagnosis: Unsteadiness on feet (R26.81);Other abnormalities of gait and mobility (R26.89);Muscle weakness (generalized) (M62.81)     Time: 9983-3825 PT Time Calculation (min) (ACUTE ONLY): 20 min  Charges:  $Gait Training: 8-22 mins                     Earney Navy, PTA Acute Rehabilitation Services Pager: [phone] Office: [phone]     Darliss Cheney 04/18/2019, 4:28 PM

## 2019-04-18 NOTE — Progress Notes (Addendum)
Progress Note    [name] Love  UMP:[id number] DOB: [birth year]  DOA: 04/16/2019 PCP: Isaias Sakai, DO    Brief Narrative:     Medical records reviewed and are as summarized below:  [name] Love is an 48 y.o. 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  Also had some numbness of the lower extremities.  Assessment/Plan:   Active Problems:   Weakness   Episode of change in speech   Weakness generalized   Cerebral thrombosis with cerebral infarction  Subacute stroke  -MRI with and without contrast last night in the emergency room notable for subacute right cerebellar infarcts. -Neurology consulting, currently on aspirin, Plavix and statin-- plan for ASA/plavix x 3 weeks then PLAVIX ALONE -Neurosurgery reprogrammed shunt after MRI Followed by Dr. Jannifer Franklin as an outpatient -LDL: 64 HgbA1c: 5.3 Carotid Doppler  B ICA 1-39% stenosis, VAs antegrade  2D Echo EF 60-65 %. No source of embolus  -PT/OT- CIR (has persistent symptoms > 48 hours after placement in hospital needing CIR level of care- has a complicated medical history as well)  History of ependymoma -Status post remote resection and whole brain radiation  Seizure disorder, continue Topamax  Low-normal B12 -IM x 1 then PO replacement  obesity Body mass index is 30.27 kg/m.     Family Communication/Anticipated D/C date and plan/Code Status    Code Status: Full Code.  Disposition Plan: CIR   Medical Consultants:   Neurology Neurosurgery    Subjective:   No overnight events  Objective:    Vitals:   04/17/19 1916 04/17/19 2330 04/18/19 0321 04/18/19 0831  BP: 106/81 112/73 118/64 109/74  Pulse: 100 81 88 66  Resp: 18 18 18 18   Temp: 98.5 F (36.9 C) 98.4 F (36.9 C) 98 F (36.7 C) 97.8 F (36.6 C)  TempSrc: Oral Oral Oral Oral  SpO2: 98% 99% 98% 100%  Weight:       Height:        Intake/Output Summary (Last 24 hours) at 04/18/2019 1119 Last data filed at 04/18/2019 0321 Gross per 24 hour  Intake --  Output 550 ml  Net -550 ml   Filed Weights   04/16/19 1158 04/16/19 2153  Weight: 89.4 kg 90.3 kg    Exam: In bed, hard of hearing rrr Moves all 4 ext No increased work of breathing  Data Reviewed:   I have personally reviewed following labs and imaging studies:  Labs: Labs show the following:   Basic Metabolic Panel: Recent Labs  Lab 04/16/19 1202 04/16/19 1227 04/17/19 0339 04/17/19 0810  NA 140 141  --  139  K 3.7 3.6  --  3.7  CL 113* 109  --  109  CO2 19*  --   --  20*  GLUCOSE 92 88  --  83  BUN 10 10  --  12  CREATININE 1.40* 1.30* 1.40* 1.31*  CALCIUM 9.0  --   --  9.1  MG  --   --  2.4  --    GFR Estimated Creatinine Clearance: 77.8 mL/min (A) (by C-G formula based on SCr of 1.31 mg/dL (H)). Liver Function Tests: Recent Labs  Lab 04/16/19 1202 04/17/19 0810  AST 22 20  ALT 29 29  ALKPHOS 78 81  BILITOT 1.0 0.9  PROT 6.7 6.6  ALBUMIN 4.0 4.0   No results for input(s):  LIPASE, AMYLASE in the last 168 hours. No results for input(s): AMMONIA in the last 168 hours. Coagulation profile Recent Labs  Lab 04/16/19 1202  INR 1.1    CBC: Recent Labs  Lab 04/16/19 1202 04/16/19 1227 04/17/19 0339 04/17/19 0810  WBC 6.0  --  6.3 5.9  NEUTROABS 4.0  --   --   --   HGB 16.5 16.3 16.6 17.2*  HCT 50.0 48.0 50.3 50.2  MCV 92.4  --  90.8 90.0  PLT 239  --  224 200   Cardiac Enzymes: No results for input(s): CKTOTAL, CKMB, CKMBINDEX, TROPONINI in the last 168 hours. BNP (last 3 results) No results for input(s): PROBNP in the last 8760 hours. CBG: Recent Labs  Lab 04/16/19 1156 04/16/19 1231  GLUCAP 87 87   D-Dimer: No results for input(s): DDIMER in the last 72 hours. Hgb A1c: Recent Labs    04/17/19 0810  HGBA1C 5.3   Lipid Profile: Recent Labs    04/17/19 0810  CHOL 119  HDL 34*   LDLCALC 64  TRIG 104  CHOLHDL 3.5   Thyroid function studies: Recent Labs    04/17/19 0339  TSH 4.294   Anemia work up: Recent Labs    04/17/19 0339  VITAMINB12 238   Sepsis Labs: Recent Labs  Lab 04/16/19 1202 04/17/19 0339 04/17/19 0810  WBC 6.0 6.3 5.9    Microbiology No results found for this or any previous visit (from the past 240 hour(s)).  Procedures and diagnostic studies:  Mr Angio Head Wo Contrast  Result Date: 04/17/2019 CLINICAL DATA:  Stroke. History of posterior fossa ependymoma resection 1995. VP shunt. Brain radiation. EXAM: MRA HEAD WITHOUT CONTRAST TECHNIQUE: Angiographic images of the Circle of Willis were obtained using MRA technique without intravenous contrast. COMPARISON:  MRI head 04/16/2019 and 04/17/2019 with contrast. FINDINGS: Both vertebral arteries are patent and contribute to the basilar. PICA not included on this study due to patient positioning. Large right AICA patent. Small left AICA. Basilar widely patent. Left superior cerebellar artery is large in size and widely patent. Small right superior cerebellar artery appears to occlude proximally. Both posterior cerebral arteries normal. Internal carotid artery widely patent. Anterior and middle cerebral arteries widely patent without stenosis Negative for aneurysm IMPRESSION: Apparent obstruction proximal right superior cerebellar artery. Review of MRI brain earlier today with contrast reveals patchy enhancement in the right mid and superior cerebellum which could be related to subacute infarction. Otherwise no significant intracranial stenosis. Electronically Signed   By: Franchot Gallo M.D.   On: 04/17/2019 12:50   Mr Brain Wo Contrast  Result Date: 04/16/2019 CLINICAL DATA:  Ataxia, rule out stroke. History of brain tumor with surgery and radiation. Shunt. EXAM: MRI HEAD WITHOUT CONTRAST TECHNIQUE: Multiplanar, multiecho pulse sequences of the brain and surrounding structures were obtained  without intravenous contrast. COMPARISON:  CT head 06/07/2018.  MRI 06/11/2018 FINDINGS: Brain: Occipital craniotomy for posterior fossa tumor resection per history. Extensive calcification in the cerebellar hemispheres bilaterally and in the pons best seen on CT. This is chronic and most likely due to prior radiation change. Fourth ventricle is chronically dilated likely due to prior surgery. The third and lateral ventricles are nondilated. Right parietal shunt catheter crosses the midline into the left lateral ventricle unchanged from the prior study. Negative for acute infarct. Patchy white matter changes bilaterally may be due to radiation change and or chronic small vessel ischemia. Chronic lacunar infarctions in the thalamus bilaterally unchanged. Small extra-axial CSF collections  stable from the prior study. No midline shift. Vascular: Normal arterial flow voids Skull and upper cervical spine: Occipital craniotomy. No acute skeletal abnormality. Sinuses/Orbits: Negative Other: None IMPRESSION: Negative for acute infarct Post surgical resection of posterior fossa tumor. No recurrent tumor. No change from the prior studies. Electronically Signed   By: Franchot Gallo M.D.   On: 04/16/2019 18:42   Mr Brain W Contrast  Result Date: 04/17/2019 CLINICAL DATA:  Speech difficulty EXAM: MRI HEAD WITH CONTRAST TECHNIQUE: Multiplanar, multiecho pulse sequences of the brain and surrounding structures were obtained with intravenous contrast. CONTRAST:  9 cc Gadavist intravenous COMPARISON:  06/11/2018 FINDINGS: Brain: Small nodular focus of enhancement in the lateral right cerebellum where there is a cluster of chronic but progressive infarcts when yesterday's noncontrast study was compared with June 11, 2018. Remote lacunar infarcts seen in the bilateral thalamus and in the deep white matter. Nodular appearance of the right caudate head without enhancement is stable and may be atypical post ischemic appearance.  Generalized dural thickening that was also seen previously, likely related to shunting. No hydrocephalus. History of ependymoma resection remotely.  No suspected recurrence. Vascular: Major vascular enhancements are preserved Skull and upper cervical spine: Suboccipital craniotomy. Sinuses/Orbits: Negative IMPRESSION: 1. Cluster of non acute infarcts in the right cerebellum that progressed between June 11, 2018 and April 16, 2019 scans. 1 of these foci is enhancing, compatible with subacute timing. There is premature microvascular ischemia which is presumably related to remote radiotherapy. 2. Chronic dural thickening in the setting of shunting. Electronically Signed   By: Monte Fantasia M.D.   On: 04/17/2019 05:35   Vas US Carotid  Result Date: 04/18/2019 Carotid Arterial Duplex Study Indications:       CVA. Comparison Study:  Prior study done 06/09/18 Performing Technologist: Abram Sander RVS  Examination Guidelines: A complete evaluation includes B-mode imaging, spectral Doppler, color Doppler, and power Doppler as needed of all accessible portions of each vessel. Bilateral testing is considered an integral part of a complete examination. Limited examinations for reoccurring indications may be performed as noted.  Right Carotid Findings: +----------+--------+--------+--------+-----------+--------+             PSV cm/s EDV cm/s Stenosis Describe    Comments  +----------+--------+--------+--------+-----------+--------+  CCA Prox   60       17                homogeneous           +----------+--------+--------+--------+-----------+--------+  CCA Distal 58       18                homogeneous           +----------+--------+--------+--------+-----------+--------+  ICA Prox   57       25       1-39%    homogeneous           +----------+--------+--------+--------+-----------+--------+  ICA Distal 51       26                                      +----------+--------+--------+--------+-----------+--------+  ECA         47       7                                       +----------+--------+--------+--------+-----------+--------+ +----------+--------+-------+--------+-------------------+  PSV cm/s EDV cms Describe Arm Pressure (mmHG)  +----------+--------+-------+--------+-------------------+  Subclavian 75                                             +----------+--------+-------+--------+-------------------+ +---------+--------+--+--------+--+---------+  Vertebral PSV cm/s 21 EDV cm/s 10 Antegrade  +---------+--------+--+--------+--+---------+  Left Carotid Findings: +----------+--------+--------+--------+-----------+--------+             PSV cm/s EDV cm/s Stenosis Describe    Comments  +----------+--------+--------+--------+-----------+--------+  CCA Prox   104      19                homogeneous           +----------+--------+--------+--------+-----------+--------+  CCA Distal 53       16                homogeneous           +----------+--------+--------+--------+-----------+--------+  ICA Prox   32       12       1-39%    homogeneous           +----------+--------+--------+--------+-----------+--------+  ICA Distal 63       23                                      +----------+--------+--------+--------+-----------+--------+  ECA        63       7                                       +----------+--------+--------+--------+-----------+--------+ +----------+--------+--------+--------+-------------------+  Subclavian PSV cm/s EDV cm/s Describe Arm Pressure (mmHG)  +----------+--------+--------+--------+-------------------+             103                                             +----------+--------+--------+--------+-------------------+ +---------+--------+--+--------+--+---------+  Vertebral PSV cm/s 42 EDV cm/s 18 Antegrade  +---------+--------+--+--------+--+---------+  Summary: Right Carotid: Velocities in the right ICA are consistent with a 1-39% stenosis. Left Carotid: Velocities in the left ICA are consistent  with a 1-39% stenosis. Vertebrals: Bilateral vertebral arteries demonstrate antegrade flow. *See table(s) above for measurements and observations.  Electronically signed by Antony Contras MD on 04/18/2019 at 8:06:01 AM.    Final     Medications:    ALPRAZolam  0.5 mg Oral BID   aspirin EC  81 mg Oral Daily   clopidogrel  75 mg Oral Daily   enoxaparin (LOVENOX) injection  40 mg Subcutaneous QHS   topiramate  100 mg Oral QHS   topiramate  50 mg Oral Daily   Continuous Infusions:   LOS: 0 days   Geradine Girt  [facility]   How to contact the [facility] Attending or Consulting provider [place] or covering provider during after hours [place], for this patient?  1. Check the care team in [facility] and look for a) attending/consulting TRH provider listed and b) the [facility] team listed 2. Log into [email or web address] and use [place]'s universal password to access. If you do not have the password, please contact  the hospital operator. 3. Locate the [facility] provider you are looking for under [facility] and page to a number that you can be directly reached. 4. If you still have difficulty reaching the provider, please page the [facility] (Director on Call) for the Hospitalists listed on amion for assistance.  04/18/2019, 11:19 AM

## 2019-04-18 NOTE — Progress Notes (Signed)
  Speech Language Pathology Treatment: Cognitive-Linquistic(Dyarthria)  Patient Details Name: [name] Love MRN: [id number] DOB: 12-Aug-1971 Today's Date: 04/18/2019 Time: 9166-0600 SLP Time Calculation (min) (ACUTE ONLY): 18 min  Assessment / Plan / Recommendation Clinical Impression  Pt was seen for dysarthria treatment and was cooperative during the session. He has demonstrated improved prosody and articulatory precision compared to yesterday and he reported that he believes that his speech is now approximately 70% back to baseline. He was able to recall some of the compensatory strategies for speech intelligibility but required min cues to recall over-articulation. He reported use of strategies when conversing with staff. He used compensatory strategies at the 5-7 word sentence level with 80% accuracy increasing to 100% accuracy with min. cues. At the 8-9 word sentence level he achieved 70% accuracy increasing to 100% with min-mod cues for overarticulation. Min-mod cues were needed during structured conversation.  SLP will continue to follow pt.    HPI HPI: Pt is a 48 y.o. 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 MRI of the brain revealed cluster of non acute infarcts in the right cerebellum.       SLP Plan  Continue with current plan of care       Recommendations                   Follow up Recommendations: Inpatient Rehab SLP Visit Diagnosis: Dysarthria and anarthria (R47.1) Plan: Continue with current plan of care       [name] Love I. Hardin Negus, [place], [place] Office number [phone] Pager [phone]                Horton Marshall 04/18/2019, 1:17 PM

## 2019-04-18 NOTE — TOC Initial Note (Signed)
Transition of Care [facility]) - Initial/Assessment Note    Patient Details  Name: [name] Love MRN: [id number] Date of Birth: May 14, 1971  [facility] CM/SW Contact:    Geralynn Ochs, LCSW Phone Number: 04/18/2019, 10:16 AM  Clinical Narrative:   CSW met with patient to discuss discharge plans.  CSW explained role and recommendation for rehabilitation. Patient acknowledged that the doctor and PT told him that he would need some rehab, and he discussed how his aunt can't take care of him right now. Patient says he needs to get stronger before he can go back home. CSW discussed options of rehab within the hospital versus in another facility, and patient would prefer to stay in the hospital if able but knows that he needs rehab either way. Patient would be agreeable to SNF if CIR is not an option. CSW to follow.                Expected Discharge Plan: IP Rehab Facility Barriers to Discharge: Continued Medical Work up, Ship broker   Patient Goals and CMS Choice Patient states their goals for this hospitalization and ongoing recovery are:: get rehab to be able to take care of himself again      Expected Discharge Plan and Services Expected Discharge Plan: [place] Acute Care Choice: IP Rehab, [place] Living arrangements for the past 2 months: Single Family Home                                      Prior Living Arrangements/Services Living arrangements for the past 2 months: Single Family Home Lives with:: Relatives Patient language and need for interpreter reviewed:: No Do you feel safe going back to the place where you live?: Yes      Need for Family Participation in Patient Care: No (Comment) Care giver support system in place?: No (comment) Current home services: DME Criminal Activity/Legal Involvement Pertinent to Current Situation/Hospitalization: No - Comment as needed  Activities of Daily Living       Permission Sought/Granted Permission sought to share information with : Chartered certified accountant granted to share information with : Yes, Verbal Permission Granted     Permission granted to share info w AGENCY: SNF        Emotional Assessment Appearance:: Appears stated age Attitude/Demeanor/Rapport: Engaged Affect (typically observed): Pleasant Orientation: : Oriented to Self, Oriented to Place, Oriented to  Time, Oriented to Situation Alcohol / Substance Use: Not Applicable Psych Involvement: No (comment)  Admission diagnosis:  Weakness [R53.1] Patient Active Problem List   Diagnosis Date Noted  . Episode of change in speech 04/17/2019  . Weakness generalized 04/17/2019  . Cerebral thrombosis with cerebral infarction 04/17/2019  . Weakness 04/16/2019  . Generalized anxiety disorder 11/28/2018  . OSA on CPAP 11/28/2018  . Hemiparesis and alteration of sensations as late effects of stroke ([place]) 09/04/2018  . Acute arterial ischemic stroke, vertebrobasilar, thalamic ([place]) 06/08/2018  . Seizure ([place])   . Recurrent falls   . Diplopia   . Acute CVA (cerebrovascular accident) ([place]) 06/07/2018  . Allergic rhinitis 04/03/2018  . Atypical chest pain 11/30/2017  . Dyspnea on exertion 11/30/2017  . Myalgia 04/18/2017  . Seizures ([place]) 01/20/2017   PCP:  Isaias Sakai, DO Pharmacy:   [place], [place] - [address] [address] [address] [place] [address] Phone: [phone] Fax: [phone]     Social Determinants of Health (SDOH) Interventions    Readmission Risk Interventions No flowsheet data found.

## 2019-04-19 ENCOUNTER — Inpatient Hospital Stay (HOSPITAL_COMMUNITY)
Admission: RE | Admit: 2019-04-19 | Payer: Medicare HMO | Source: Intra-hospital | Admitting: Physical Medicine & Rehabilitation

## 2019-04-19 NOTE — Progress Notes (Addendum)
Physical Therapy Treatment Patient Details Name: [name] Love MRN: [id number] DOB: 02-22-1971 Today's Date: 04/19/2019    History of Present Illness Patient is a 48 y/o male presenting to the ED on 04/16/2019 with primary complaints of weakness and word finding difficulties. MRI revealing subacute infarct at right cerebellum. Past history of brain tumor resection in his posterior fossa, in addition to programmable shunt placement, seizures, hearing loss, anxiety.    PT Comments    Patient seen for mobility progression. This session focused on gait training without AD. Continue to recommend CIR.    Follow Up Recommendations  CIR     Equipment Recommendations  None recommended by PT    Recommendations for Other Services       Precautions / Restrictions Precautions Precautions: Fall Restrictions Weight Bearing Restrictions: No    Mobility  Bed Mobility Overal bed mobility: Modified Independent                Transfers Overall transfer level: Needs assistance Equipment used: None Transfers: Sit to/from Stand Sit to Stand: Min assist         General transfer comment: assist to steady  Ambulation/Gait Ambulation/Gait assistance: Mod assist;Min assist Gait Distance (Feet): 200 Feet Assistive device: Rolling walker (2 wheeled);None Gait Pattern/deviations: Step-through pattern;Trunk flexed;Decreased dorsiflexion - right;Decreased dorsiflexion - left;Drifts right/left;Decreased step length - right;Decreased step length - left;Ataxic     General Gait Details: gait with and without AD; pt requires increased assistance to maintain balance without use of AD and with varying gait speeds with poor coordination; multimodal cues for cadence and step lengths    Stairs             Wheelchair Mobility    Modified Rankin (Stroke Patients Only)       Balance Overall balance assessment: Needs assistance Sitting-balance support: No upper extremity supported;Feet  supported Sitting balance-Leahy Scale: Good     Standing balance support: During functional activity;Single extremity supported Standing balance-Leahy Scale: Poor                              Cognition Arousal/Alertness: Awake/alert Behavior During Therapy: WFL for tasks assessed/performed Overall Cognitive Status: History of cognitive impairments - at baseline                                 General Comments: HOH - has B hearing aides      Exercises      General Comments        Pertinent Vitals/Pain Pain Assessment: No/denies pain    Home Living                      Prior Function            PT Goals (current goals can now be found in the care plan section) Progress towards PT goals: Progressing toward goals    Frequency    Min 4X/week      PT Plan Current plan remains appropriate    Co-evaluation              AM-PAC PT "6 Clicks" Mobility   Outcome Measure  Help needed turning from your back to your side while in a flat bed without using bedrails?: A Little Help needed moving from lying on your back to sitting on the side of a flat bed without using bedrails?: A  Little Help needed moving to and from a bed to a chair (including a wheelchair)?: A Little Help needed standing up from a chair using your arms (e.g., wheelchair or bedside chair)?: A Little Help needed to walk in hospital room?: A Lot Help needed climbing 3-5 steps with a railing? : A Lot 6 Click Score: 16    End of Session Equipment Utilized During Treatment: Gait belt Activity Tolerance: Patient tolerated treatment well Patient left: with call bell/phone within reach;in bed;with bed alarm set Nurse Communication: Mobility status PT Visit Diagnosis: Unsteadiness on feet (R26.81);Other abnormalities of gait and mobility (R26.89);Muscle weakness (generalized) (M62.81)     Time: 7493-5521 PT Time Calculation (min) (ACUTE ONLY): 20 min  Charges:   $Gait Training: 8-22 mins                     Earney Navy, PTA Acute Rehabilitation Services Pager: [phone] Office: [phone]     Darliss Cheney 04/19/2019, 5:12 PM

## 2019-04-19 NOTE — Progress Notes (Signed)
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  Also complained of numbness of the lower extremities.  MRI of the brain showed subacute right cerebral infarcts.  Neurology consulted.  Plan is to continue aspirin and Plavix for 3 weeks followed by Plavix alone.  PT recommended CIR. Patient is medically stable for discharge to CIR as soon as the bed is available.  Assessment & Plan:   Active Problems:   Weakness   Episode of change in speech   Weakness generalized   Cerebral thrombosis with cerebral infarction   CVA (cerebrovascular accident) ([place])   Stroke-like symptoms   Subacute stroke: MRI showed subacute right cerebellar infarct.  Neurology was consulted.  Currently on aspirin/Plavix which will be continued for 3 weeks then Plavix alone.  Neurosurgery reprogrammed shunt after MRI.  He follows with Dr. Jannifer Franklin as an outpatient.  LDL of 64.  Hemoglobin A1c of 5.3.  Carotid doppler but did not show any significant stenosis.  Echocardiogram showed ejection fraction of 60-65%, no source of embolus.  PT/OT evaluation done and recommended CIR.  History of ependymoma: Status post remote resection and whole brain radiation.  Currently stable  Seizure disorder: Continue Topamax  Obesity: BMI of 30.         DVT prophylaxis: Lovenox Code Status: Full Family Communication: None present at the bedside Disposition Plan: CIR as soon as bed is available   Consultants: Neurology  Procedures: MRI  Antimicrobials:  Anti-infectives (From admission, onward)   None      Subjective:  Patient seen and examined the bedside this morning.  Hemodynamically stable.  Remains comfortable.  No new  complaints.  Objective: Vitals:   04/18/19 2340 04/19/19 0300 04/19/19 0817 04/19/19 1254  BP: 110/67 118/61 111/80 113/82  Pulse: 81 88 73 76  Resp: 18 18 17 16   Temp: 98 F (36.7 C) 98.3 F (36.8 C) 98.1 F (36.7 C) 98.3 F (36.8 C)  TempSrc: Oral Oral Oral Oral  SpO2: 99% 99% 98% 99%  Weight:      Height:        Intake/Output Summary (Last 24 hours) at 04/19/2019 1424 Last data filed at 04/19/2019 1050 Gross per 24 hour  Intake 240 ml  Output 1400 ml  Net -1160 ml   Filed Weights   04/16/19 1158 04/16/19 2153  Weight: 89.4 kg 90.3 kg    Examination:  General exam: Appears calm and comfortable ,Not in distress,average built HEENT:PERRL,Oral mucosa moist, Ear/Nose normal on gross exam Respiratory system: Bilateral equal air entry, normal vesicular breath sounds, no wheezes or crackles  Cardiovascular system: S1 & S2 heard, RRR. No JVD, murmurs, rubs, gallops or clicks. No pedal edema. Gastrointestinal system: Abdomen is nondistended, soft and nontender. No organomegaly or masses felt. Normal bowel sounds heard. Central nervous system: Alert and oriented. Mild dysarthia extremities: No edema, no clubbing ,no cyanosis, distal peripheral pulses palpable. Skin: No rashes, lesions or ulcers,no icterus ,no pallor MSK: Normal muscle bulk,tone ,power Psychiatry: Judgement and insight appear normal. Mood & affect appropriate.     Data Reviewed: I have personally reviewed following labs and imaging studies  CBC: Recent Labs  Lab 04/16/19 1202 04/16/19 1227 04/17/19 0339 04/17/19 0810  WBC 6.0  --  6.3 5.9  NEUTROABS 4.0  --   --   --   HGB 16.5 16.3 16.6 17.2*  HCT 50.0 48.0 50.3 50.2  MCV 92.4  --  90.8 90.0  PLT 239  --  224 563   Basic Metabolic Panel: Recent Labs  Lab 04/16/19 1202 04/16/19 1227 04/17/19 0339 04/17/19 0810  NA 140 141  --  139  K 3.7 3.6  --  3.7  CL 113* 109  --  109  CO2 19*  --   --  20*  GLUCOSE 92 88  --  83  BUN 10 10  --  12    CREATININE 1.40* 1.30* 1.40* 1.31*  CALCIUM 9.0  --   --  9.1  MG  --   --  2.4  --    GFR: Estimated Creatinine Clearance: 77.8 mL/min (A) (by C-G formula based on SCr of 1.31 mg/dL (H)). Liver Function Tests: Recent Labs  Lab 04/16/19 1202 04/17/19 0810  AST 22 20  ALT 29 29  ALKPHOS 78 81  BILITOT 1.0 0.9  PROT 6.7 6.6  ALBUMIN 4.0 4.0   No results for input(s): LIPASE, AMYLASE in the last 168 hours. No results for input(s): AMMONIA in the last 168 hours. Coagulation Profile: Recent Labs  Lab 04/16/19 1202  INR 1.1   Cardiac Enzymes: No results for input(s): CKTOTAL, CKMB, CKMBINDEX, TROPONINI in the last 168 hours. BNP (last 3 results) No results for input(s): PROBNP in the last 8760 hours. HbA1C: Recent Labs    04/17/19 0810  HGBA1C 5.3   CBG: Recent Labs  Lab 04/16/19 1156 04/16/19 1231  GLUCAP 87 87   Lipid Profile: Recent Labs    04/17/19 0810  CHOL 119  HDL 34*  LDLCALC 64  TRIG 104  CHOLHDL 3.5   Thyroid Function Tests: Recent Labs    04/17/19 0339  TSH 4.294   Anemia Panel: Recent Labs    04/17/19 0339  VITAMINB12 238   Sepsis Labs: No results for input(s): PROCALCITON, LATICACIDVEN in the last 168 hours.  No results found for this or any previous visit (from the past 240 hour(s)).       Radiology Studies: No results found.      Scheduled Meds:  ALPRAZolam  0.5 mg Oral BID   aspirin EC  81 mg Oral Daily   clopidogrel  75 mg Oral Daily   enoxaparin (LOVENOX) injection  40 mg Subcutaneous QHS   topiramate  100 mg Oral QHS   topiramate  50 mg Oral Daily   Continuous Infusions:   LOS: 1 day    Time spent: 35 mins.More than 50% of that time was spent in counseling and/or coordination of care.      Shelly Coss, MD [facility] Pager [phone]  If 7PM-7AM, please contact night-coverage [email or web address] Password [facility] 04/19/2019, 2:24 PM

## 2019-04-19 NOTE — Progress Notes (Signed)
Inpatient Rehabilitation-Admissions Coordinator   Pt's insurance has issued initial denial for CIR. PM&R MD, Dr. Naaman Plummer, has agreed to complete peer to peer as attempt to overturn denial. Will follow up once final determination has been made after peer to peer conference.   Please call if questions.   Jhonnie Garner, OTR/L  Rehab Admissions Coordinator  [phone] 04/19/2019 6:13 PM

## 2019-04-20 LAB — METHYLMALONIC ACID, SERUM: Methylmalonic Acid, Quantitative: 139 nmol/L (ref 0–378)

## 2019-04-20 LAB — NOVEL CORONAVIRUS, NAA (HOSP ORDER, SEND-OUT TO REF LAB; TAT 18-24 HRS): SARS-CoV-2, NAA: NOT DETECTED

## 2019-04-20 MED ORDER — ASPIRIN 81 MG PO TBEC: 81.0000 mg | DELAYED_RELEASE_TABLET | Freq: Every day | ORAL | 0 refills | Status: DC

## 2019-04-20 MED ORDER — CLOPIDOGREL BISULFATE 75 MG PO TABS: 75.0000 mg | ORAL_TABLET | Freq: Every day | ORAL | 0 refills | Status: DC

## 2019-04-20 NOTE — Care Management Important Message (Signed)
Important Message  Patient Details  Name: [name] Love MRN: [id number] Date of Birth: Oct 16, 1970   Medicare Important Message Given:  Yes     Shelda Altes 04/20/2019, 3:30 PM

## 2019-04-20 NOTE — Plan of Care (Signed)
  Problem: Education: Goal: Knowledge of General Education information will improve Description: Including pain rating scale, medication(s)/side effects and non-pharmacologic comfort measures Outcome: Adequate for Discharge   Problem: Health Behavior/Discharge Planning: Goal: Ability to manage health-related needs will improve Outcome: Adequate for Discharge   Problem: Clinical Measurements: Goal: Ability to maintain clinical measurements within normal limits will improve Outcome: Adequate for Discharge Goal: Will remain free from infection Outcome: Adequate for Discharge Goal: Diagnostic test results will improve Outcome: Adequate for Discharge Goal: Respiratory complications will improve Outcome: Adequate for Discharge Goal: Cardiovascular complication will be avoided Outcome: Adequate for Discharge   Problem: Activity: Goal: Risk for activity intolerance will decrease Outcome: Adequate for Discharge   Problem: Nutrition: Goal: Adequate nutrition will be maintained Outcome: Adequate for Discharge   Problem: Coping: Goal: Level of anxiety will decrease Outcome: Adequate for Discharge   Problem: Elimination: Goal: Will not experience complications related to bowel motility Outcome: Adequate for Discharge Goal: Will not experience complications related to urinary retention Outcome: Adequate for Discharge   Problem: Pain Managment: Goal: General experience of comfort will improve Outcome: Adequate for Discharge   Problem: Safety: Goal: Ability to remain free from injury will improve Outcome: Adequate for Discharge   Problem: Skin Integrity: Goal: Risk for impaired skin integrity will decrease Outcome: Adequate for Discharge   Problem: Education: Goal: Knowledge of disease or condition will improve Outcome: Adequate for Discharge Goal: Knowledge of secondary prevention will improve Outcome: Adequate for Discharge Goal: Knowledge of patient specific risk factors  addressed and post discharge goals established will improve Outcome: Adequate for Discharge Goal: Individualized Educational Video(s) Outcome: Adequate for Discharge   Problem: Coping: Goal: Will verbalize positive feelings about self Outcome: Adequate for Discharge Goal: Will identify appropriate support needs Outcome: Adequate for Discharge   Problem: Health Behavior/Discharge Planning: Goal: Ability to manage health-related needs will improve Outcome: Adequate for Discharge   Problem: Self-Care: Goal: Ability to participate in self-care as condition permits will improve Outcome: Adequate for Discharge Goal: Verbalization of feelings and concerns over difficulty with self-care will improve Outcome: Adequate for Discharge Goal: Ability to communicate needs accurately will improve Outcome: Adequate for Discharge   Problem: Nutrition: Goal: Risk of aspiration will decrease Outcome: Adequate for Discharge Goal: Dietary intake will improve Outcome: Adequate for Discharge   Problem: Intracerebral Hemorrhage Tissue Perfusion: Goal: Complications of Intracerebral Hemorrhage will be minimized Outcome: Adequate for Discharge   Problem: Ischemic Stroke/TIA Tissue Perfusion: Goal: Complications of ischemic stroke/TIA will be minimized Outcome: Adequate for Discharge   Problem: Spontaneous Subarachnoid Hemorrhage Tissue Perfusion: Goal: Complications of Spontaneous Subarachnoid Hemorrhage will be minimized Outcome: Adequate for Discharge   

## 2019-04-20 NOTE — Progress Notes (Signed)
Patient for discharge home today.  Telemetry discontinued. IV removed.  AVS discussed and vital signs stable.  No c/o pain and discomfort. Patient waiting for his ride home.

## 2019-04-20 NOTE — TOC Transition Note (Signed)
Transition of Care [facility]) - CM/SW Discharge Note   Patient Details  Name: [name] Love MRN: [id number] Date of Birth: 1971/07/28  [facility] CM/SW Contact:  Geralynn Ochs, LCSW Phone Number: 04/20/2019, 2:51 PM   Clinical Narrative:   Patient discharging home today. Set up with [facility] for home health services. Primary care appointment scheduled for patient at [facility]. Aunt will be picking up patient to provide transport home.     Final next level of care: [place] Barriers to Discharge: Barriers Resolved   Patient Goals and CMS Choice Patient states their goals for this hospitalization and ongoing recovery are:: get rehab to be able to take care of himself again      Discharge Placement                Patient to be transferred to facility by: Family car Name of family member notified: Self Patient and family notified of of transfer: 04/20/19  Discharge Plan and Services     Post Acute Care Choice: IP Rehab, Skilled Nursing Facility                    HH Arranged: PT, OT, Speech Therapy [place] Agency: [facility] [place] Determinants of Health (SDOH) Interventions     Readmission Risk Interventions No flowsheet data found.

## 2019-04-20 NOTE — Progress Notes (Signed)
Occupational Therapy Treatment Patient Details Name: [name] Love MRN: [id number] DOB: 1971/09/23 Today's Date: 04/20/2019    History of present illness Patient is a 48 y/o male presenting to the ED on 04/16/2019 with primary complaints of weakness and word finding difficulties. MRI revealing subacute infarct at right cerebellum. Past history of brain tumor resection in his posterior fossa, in addition to programmable shunt placement, seizures, hearing loss, anxiety.   OT comments  Pt progressing well with therapy and performing ADL in standing and sitting at EOB and at sink with fair balance with challenges after set-upA. Pt tolerating session well. Pt requiring supervision for ambulation, but no AD used today. Pt tolerating session well. Pt working towards goals. Continued OT for ADL and safety in [place] setting. OT following.    Follow Up Recommendations  Home health OT;Supervision/Assistance - 24 hour    Equipment Recommendations  None recommended by OT    Recommendations for Other Services      Precautions / Restrictions Precautions Precautions: Fall Restrictions Weight Bearing Restrictions: No       Mobility Bed Mobility Overal bed mobility: Modified Independent                Transfers Overall transfer level: Needs assistance Equipment used: None Transfers: Sit to/from Stand Sit to Stand: Min guard         General transfer comment: assist for initial stand balance    Balance Overall balance assessment: Needs assistance Sitting-balance support: No upper extremity supported;Feet supported Sitting balance-Leahy Scale: Good     Standing balance support: During functional activity Standing balance-Leahy Scale: Fair                             ADL either performed or assessed with clinical judgement   ADL Overall ADL's : Needs assistance/impaired     Grooming: Wash/dry hands;Wash/dry face;Oral care;Brushing  hair;Supervision/safety;Sitting;Standing Grooming Details (indicate cue type and reason): standing at sink for UB ADL and sitting for teeth brushing.         Upper Body Dressing : Set up;Sitting   Lower Body Dressing: Set up;Sitting/lateral leans;Sit to/from stand   Toilet Transfer: Supervision/safety;Grab bars   Toileting- Clothing Manipulation and Hygiene: Set up;Sitting/lateral lean;Sit to/from stand       Functional mobility during ADLs: Min guard;Cueing for safety General ADL Comments: ADLs at sink and at EOB in prep for D/C home     Vision   Vision Assessment?: No apparent visual deficits   Perception     Praxis      Cognition Arousal/Alertness: Awake/alert Behavior During Therapy: WFL for tasks assessed/performed Overall Cognitive Status: History of cognitive impairments - at baseline                                 General Comments: HOH - has B hearing aides        Exercises     Shoulder Instructions       General Comments      Pertinent Vitals/ Pain       Pain Assessment: No/denies pain  Home Living                                          Prior Functioning/Environment  Frequency  Min 2X/week        Progress Toward Goals  OT Goals(current goals can now be found in the care plan section)  Progress towards OT goals: Progressing toward goals  Acute Rehab OT Goals Patient Stated Goal: get stronger, improve balance OT Goal Formulation: With patient Time For Goal Achievement: 05/02/19 Potential to Achieve Goals: Good ADL Goals Pt Will Perform Grooming: with modified independence Pt Will Perform Upper Body Bathing: with supervision Pt Will Perform Lower Body Bathing: with supervision Pt Will Perform Upper Body Dressing: with supervision Pt Will Perform Lower Body Dressing: with supervision Pt Will Transfer to Toilet: with supervision Pt Will Perform Toileting - Clothing Manipulation and  hygiene: with supervision Pt Will Perform Tub/Shower Transfer: with supervision  Plan Discharge plan needs to be updated    Co-evaluation                 AM-PAC OT "6 Clicks" Daily Activity     Outcome Measure   Help from another person eating meals?: None Help from another person taking care of personal grooming?: None Help from another person toileting, which includes using toliet, bedpan, or urinal?: A Little Help from another person bathing (including washing, rinsing, drying)?: A Little Help from another person to put on and taking off regular upper body clothing?: A Little Help from another person to put on and taking off regular lower body clothing?: A Little 6 Click Score: 20    End of Session Equipment Utilized During Treatment: Gait belt  OT Visit Diagnosis: Muscle weakness (generalized) (M62.81)   Activity Tolerance Patient tolerated treatment well   Patient Left in bed;with call bell/phone within reach;with bed alarm set   Nurse Communication Mobility status        Time: 9417-4081 OT Time Calculation (min): 16 min  Charges: OT General Charges $OT Visit: 1 Visit OT Treatments $Self Care/Home Management : 8-22 mins  Darryl Nestle) Marsa Aris OTR/L Acute Rehabilitation Services Pager: [phone] Office: [phone]    Ethyle Tiedt J Branda Chaudhary 04/20/2019, 3:00 PM

## 2019-04-20 NOTE — Discharge Summary (Signed)
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  Also complained of numbness of the lower extremities.  MRI of the brain showed subacute right cerebral infarcts.  Neurology consulted.    Full work-up done.  Plan is to continue aspirin and Plavix for 3 weeks followed by Plavix alone.  PT recommended CIR but his insurance denied.  Patient wanted to go home with home health.  Patient is hemodynamically stable for discharge to home today.  Following problems were addressed during his hospitalization:  Subacute stroke: MRI showed subacute right cerebellar infarct.  Neurology was consulted.  Currently on aspirin/Plavix which will be continued for 3 weeks then Plavix alone.  Neurosurgery reprogrammed shunt after MRI.  He follows with Dr. Jannifer Franklin as an outpatient.  LDL of 64.  Hemoglobin A1c of 5.3.  Carotid doppler but did not show any significant stenosis.  Echocardiogram showed ejection fraction of 60-65%, no source of embolus.  PT/OT evaluation done and recommended CIR.Insurance denied CIR so he is being discharge to home with home health.  History of ependymoma: Status post remote resection and whole brain radiation.  Currently stable.Folow up as an outpatient.  Seizure disorder: Continue Topamax  Obesity: BMI of 30.  Discharge Diagnoses:  Active Problems:   Weakness   Episode of change in speech   Weakness generalized   Cerebral thrombosis with cerebral infarction   CVA (cerebrovascular accident) ([place])    Stroke-like symptoms    Discharge Instructions  Discharge Instructions    Ambulatory referral to Neurology   Complete by: As directed    An appointment is requested in approximately: 4 weeks   Diet - low sodium heart healthy   Complete by: As directed    Discharge instructions   Complete by: As directed    1) Please follow-up with your PCP in a week. 2)Follow up with neurology in 4 weeks.  Name and number of the provider has been attached. 3)Take prescribed medications as instructed.  Continue aspirin and Plavix both for next 17 days then continue Plavix only.   Increase activity slowly   Complete by: As directed      Allergies as of 04/20/2019      Reactions   Baclofen    Urinary retension   Seroquel [quetiapine]    Weight gain   Sulfa Antibiotics    Coconut Flavor Rash   ANYTHING COCONUT   Contrast Media [iodinated Diagnostic Agents] Rash      Medication List    TAKE these medications   ALPRAZolam 0.5 MG tablet Commonly known as: XANAX Take 0.5 mg by mouth 2 (two) times daily.   aspirin 81 MG EC tablet Take 1 tablet (81 mg total) by mouth daily. Please discontinue after 17 days What changed: additional instructions   clopidogrel 75 MG tablet Commonly known as: PLAVIX Take 1 tablet (75 mg total) by mouth daily. Start taking on: April 21, 2019   topiramate 50 MG tablet Commonly known as: TOPAMAX TAKE 1 TABLET BY MOUTH IN THE MORNING AND 2 TABLETS  IN THE EVENING What changed:   how much to  take  how to take this  when to take this      Follow-up Information    Kathrynn Ducking, MD. Schedule an appointment as soon as possible for a visit in 4 week(s).   Specialty: Neurology Contact information: [address] [place] [address] [place], [place], DO. Schedule an appointment as soon as possible for a visit in 1 week(s).   Specialties: Family Medicine, Obstetrics and Gynecology Contact information: [place] [address] [phone]          Allergies  Allergen Reactions  . Baclofen     Urinary retension  . Seroquel [Quetiapine]     Weight gain  . Sulfa Antibiotics   . Coconut Flavor Rash    ANYTHING COCONUT  . Contrast Media [Iodinated Diagnostic Agents] Rash    Consultations:  Neurology   Procedures/Studies: Mr Angio Head Wo Contrast  Result Date: 04/17/2019 CLINICAL DATA:  Stroke. History of posterior fossa ependymoma resection 1995. VP shunt. Brain radiation. EXAM: MRA HEAD WITHOUT CONTRAST TECHNIQUE: Angiographic images of the Circle of Willis were obtained using MRA technique without intravenous contrast. COMPARISON:  MRI head 04/16/2019 and 04/17/2019 with contrast. FINDINGS: Both vertebral arteries are patent and contribute to the basilar. PICA not included on this study due to patient positioning. Large right AICA patent. Small left AICA. Basilar widely patent. Left superior cerebellar artery is large in size and widely patent. Small right superior cerebellar artery appears to occlude proximally. Both posterior cerebral arteries normal. Internal carotid artery widely patent. Anterior and middle cerebral arteries widely patent without stenosis Negative for aneurysm IMPRESSION: Apparent obstruction proximal right superior cerebellar artery. Review of MRI brain earlier today with contrast reveals patchy enhancement in the right mid and superior cerebellum which could be related to subacute infarction. Otherwise no significant intracranial stenosis. Electronically Signed   By: Franchot Gallo M.D.   On: 04/17/2019 12:50   Mr Brain Wo Contrast  Result Date: 04/16/2019 CLINICAL DATA:  Ataxia, rule out stroke. History of brain tumor with surgery and radiation. Shunt. EXAM: MRI HEAD WITHOUT CONTRAST TECHNIQUE: Multiplanar, multiecho pulse sequences of the brain and surrounding structures were obtained without intravenous contrast. COMPARISON:  CT head 06/07/2018.   MRI 06/11/2018 FINDINGS: Brain: Occipital craniotomy for posterior fossa tumor resection per history. Extensive calcification in the cerebellar hemispheres bilaterally and in the pons best seen on CT. This is chronic and most likely due to prior radiation change. Fourth ventricle is chronically dilated likely due to prior surgery. The third and lateral ventricles are nondilated. Right parietal shunt catheter crosses the midline into the left lateral ventricle unchanged from the prior study. Negative for acute infarct. Patchy white matter changes bilaterally may be due to radiation change and or chronic small vessel ischemia. Chronic lacunar infarctions in the thalamus bilaterally unchanged. Small extra-axial CSF collections stable from the prior study. No midline shift. Vascular: Normal arterial flow voids Skull and upper cervical spine: Occipital craniotomy. No acute skeletal abnormality. Sinuses/Orbits: Negative Other: None IMPRESSION: Negative for acute infarct Post surgical resection of posterior fossa tumor. No recurrent tumor. No change from the prior studies. Electronically Signed   By: Franchot Gallo M.D.   On: 04/16/2019 18:42   Mr Brain W Contrast  Result Date: 04/17/2019 CLINICAL DATA:  Speech difficulty EXAM: MRI HEAD WITH CONTRAST TECHNIQUE: Multiplanar, multiecho pulse sequences of the brain and surrounding structures were obtained with intravenous contrast. CONTRAST:  9 cc Gadavist intravenous COMPARISON:  06/11/2018 FINDINGS: Brain: Small nodular focus of enhancement in the lateral right cerebellum where there is a cluster of chronic but progressive infarcts when yesterday's noncontrast study was compared with June 11, 2018. Remote lacunar infarcts seen in the bilateral thalamus and in the deep white matter. Nodular appearance of the right caudate head without enhancement is stable and may be atypical post ischemic appearance. Generalized dural thickening that was also seen previously,  likely related to shunting. No hydrocephalus. History of ependymoma resection remotely.  No suspected recurrence. Vascular: Major vascular enhancements are preserved Skull and upper cervical spine: Suboccipital craniotomy. Sinuses/Orbits: Negative IMPRESSION: 1. Cluster of non acute infarcts in the right cerebellum that progressed between June 11, 2018 and April 16, 2019 scans. 1 of these foci is enhancing, compatible with subacute timing. There is premature microvascular ischemia which is presumably related to remote radiotherapy. 2. Chronic dural thickening in the setting of shunting. Electronically Signed   By: Monte Fantasia M.D.   On: 04/17/2019 05:35   Vas US Carotid  Result Date: 04/18/2019 Carotid Arterial Duplex Study Indications:       CVA. Comparison Study:  Prior study done 06/09/18 Performing Technologist: Abram Sander RVS  Examination Guidelines: A complete evaluation includes B-mode imaging, spectral Doppler, color Doppler, and power Doppler as needed of all accessible portions of each vessel. Bilateral testing is considered an integral part of a complete examination. Limited examinations for reoccurring indications may be performed as noted.  Right Carotid Findings: +----------+--------+--------+--------+-----------+--------+           PSV cm/sEDV cm/sStenosisDescribe   Comments +----------+--------+--------+--------+-----------+--------+ CCA Prox  60      17              homogeneous         +----------+--------+--------+--------+-----------+--------+ CCA Distal58      18              homogeneous         +----------+--------+--------+--------+-----------+--------+ ICA Prox  57      25      1-39%   homogeneous         +----------+--------+--------+--------+-----------+--------+ ICA Distal51      26                                  +----------+--------+--------+--------+-----------+--------+ ECA       47      7                                    +----------+--------+--------+--------+-----------+--------+ +----------+--------+-------+--------+-------------------+           PSV cm/sEDV cmsDescribeArm Pressure (mmHG) +----------+--------+-------+--------+-------------------+ [id number]                                         +----------+--------+-------+--------+-------------------+ +---------+--------+--+--------+--+---------+ VertebralPSV cm/s21EDV cm/s10Antegrade +---------+--------+--+--------+--+---------+  Left Carotid Findings: +----------+--------+--------+--------+-----------+--------+           PSV cm/sEDV cm/sStenosisDescribe   Comments +----------+--------+--------+--------+-----------+--------+ CCA Prox  104     19              homogeneous         +----------+--------+--------+--------+-----------+--------+ CCA Distal53      16  homogeneous         +----------+--------+--------+--------+-----------+--------+ ICA Prox  32      12      1-39%   homogeneous         +----------+--------+--------+--------+-----------+--------+ ICA Distal63      23                                  +----------+--------+--------+--------+-----------+--------+ ECA       63      7                                   +----------+--------+--------+--------+-----------+--------+ +----------+--------+--------+--------+-------------------+ SubclavianPSV cm/sEDV cm/sDescribeArm Pressure (mmHG) +----------+--------+--------+--------+-------------------+           103                                         +----------+--------+--------+--------+-------------------+ +---------+--------+--+--------+--+---------+ VertebralPSV cm/s42EDV cm/s18Antegrade +---------+--------+--+--------+--+---------+  Summary: Right Carotid: Velocities in the right ICA are consistent with a 1-39% stenosis. Left Carotid: Velocities in the left ICA are consistent with a 1-39% stenosis. Vertebrals: Bilateral  vertebral arteries demonstrate antegrade flow. *See table(s) above for measurements and observations.  Electronically signed by Antony Contras MD on 04/18/2019 at 8:06:01 AM.    Final        Subjective:  Patient seen and examined at bedside this morning.  Hemodynamically stable for discharge today.  Discharge Exam: Vitals:   04/20/19 0717 04/20/19 1220  BP: 103/72 102/83  Pulse: 67 77  Resp: 16 17  Temp: 98 F (36.7 C) 98.3 F (36.8 C)  SpO2: 100% 100%   Vitals:   04/20/19 0329 04/20/19 0329 04/20/19 0717 04/20/19 1220  BP: 91/64 91/64 103/72 102/83  Pulse: 65 65 67 77  Resp: 15 15 16 17   Temp: 97.7 F (36.5 C) 97.7 F (36.5 C) 98 F (36.7 C) 98.3 F (36.8 C)  TempSrc: Oral Oral Oral Oral  SpO2: 98% 98% 100% 100%  Weight:      Height:        General: Pt is alert, awake, not in acute distress Cardiovascular: RRR, S1/S2 +, no rubs, no gallops Respiratory: CTA bilaterally, no wheezing, no rhonchi Abdominal: Soft, NT, ND, bowel sounds + Extremities: no edema, no cyanosis    The results of significant diagnostics from this hospitalization (including imaging, microbiology, ancillary and laboratory) are listed below for reference.     Microbiology: Recent Results (from the past 240 hour(s))  Novel Coronavirus,NAA,(SEND-OUT TO REF LAB - TAT 24-48 hrs); Hosp Order     Status: None   Collection Time: 04/16/19  8:43 PM   Specimen: Nasopharyngeal Swab; Respiratory  Result Value Ref Range Status   SARS-CoV-2, NAA NOT DETECTED NOT DETECTED Final    Comment: (NOTE) This test was developed and its performance characteristics determined by [employer]. This test has not been FDA cleared or approved. This test has been authorized by FDA under an Emergency Use Authorization (EUA). This test is only authorized for the duration of time the declaration that circumstances exist justifying the authorization of the emergency use of in vitro diagnostic tests for detection of  SARS-CoV-2 virus and/or diagnosis of COVID-19 infection under section 564(b)(1) of the Act, 21 U.S.C. 956OZH-0(Q)(6), unless the authorization is terminated or revoked sooner. When diagnostic  testing is negative, the possibility of a false negative result should be considered in the context of a patient's recent exposures and the presence of clinical signs and symptoms consistent with COVID-19. An individual without symptoms of COVID-19 and who is not shedding SARS-CoV-2 virus would expect to have a negative (not detected) result in this assay. Performed  At: [facility] [address] [place], [place] [id number] Rush Farmer MD JY:[id number]    [place]  Final    Comment: Performed at [facility], [place] [address], [place], [place] [address]     Labs: BNP (last 3 results) No results for input(s): BNP in the last 8760 hours. Basic Metabolic Panel: Recent Labs  Lab 04/16/19 1202 04/16/19 1227 04/17/19 0339 04/17/19 0810  NA 140 141  --  139  K 3.7 3.6  --  3.7  CL 113* 109  --  109  CO2 19*  --   --  20*  GLUCOSE 92 88  --  83  BUN 10 10  --  12  CREATININE 1.40* 1.30* 1.40* 1.31*  CALCIUM 9.0  --   --  9.1  MG  --   --  2.4  --    Liver Function Tests: Recent Labs  Lab 04/16/19 1202 04/17/19 0810  AST 22 20  ALT 29 29  ALKPHOS 78 81  BILITOT 1.0 0.9  PROT 6.7 6.6  ALBUMIN 4.0 4.0   No results for input(s): LIPASE, AMYLASE in the last 168 hours. No results for input(s): AMMONIA in the last 168 hours. CBC: Recent Labs  Lab 04/16/19 1202 04/16/19 1227 04/17/19 0339 04/17/19 0810  WBC 6.0  --  6.3 5.9  NEUTROABS 4.0  --   --   --   HGB 16.5 16.3 16.6 17.2*  HCT 50.0 48.0 50.3 50.2  MCV 92.4  --  90.8 90.0  PLT 239  --  224 200   Cardiac Enzymes: No results for input(s): CKTOTAL, CKMB, CKMBINDEX, TROPONINI in the last 168 hours. BNP: Invalid input(s): POCBNP CBG: Recent Labs  Lab 04/16/19 1156 04/16/19 1231   GLUCAP 87 87   D-Dimer No results for input(s): DDIMER in the last 72 hours. Hgb A1c No results for input(s): HGBA1C in the last 72 hours. Lipid Profile No results for input(s): CHOL, HDL, LDLCALC, TRIG, CHOLHDL, LDLDIRECT in the last 72 hours. Thyroid function studies No results for input(s): TSH, T4TOTAL, T3FREE, THYROIDAB in the last 72 hours.  Invalid input(s): FREET3 Anemia work up No results for input(s): VITAMINB12, FOLATE, FERRITIN, TIBC, IRON, RETICCTPCT in the last 72 hours. Urinalysis    Component Value Date/Time   COLORURINE Yellow 11/13/2012 1300   APPEARANCEUR Clear 11/13/2012 1300   LABSPEC 1.020 11/13/2012 1300   PHURINE 5.0 11/13/2012 1300   GLUCOSEU Negative 11/13/2012 1300   HGBUR Negative 11/13/2012 1300   BILIRUBINUR Negative 11/13/2012 1300   KETONESUR Negative 11/13/2012 1300   PROTEINUR Negative 11/13/2012 1300   NITRITE Negative 11/13/2012 1300   LEUKOCYTESUR Negative 11/13/2012 1300   Sepsis Labs Invalid input(s): PROCALCITONIN,  WBC,  LACTICIDVEN Microbiology Recent Results (from the past 240 hour(s))  Novel Coronavirus,NAA,(SEND-OUT TO REF LAB - TAT 24-48 hrs); Hosp Order     Status: None   Collection Time: 04/16/19  8:43 PM   Specimen: Nasopharyngeal Swab; Respiratory  Result Value Ref Range Status   SARS-CoV-2, NAA NOT DETECTED NOT DETECTED Final    Comment: (NOTE) This test was developed and its performance characteristics determined by  [employer]. This test has not been FDA cleared or approved. This test has been authorized by FDA under an Emergency Use Authorization (EUA). This test is only authorized for the duration of time the declaration that circumstances exist justifying the authorization of the emergency use of in vitro diagnostic tests for detection of SARS-CoV-2 virus and/or diagnosis of COVID-19 infection under section 564(b)(1) of the Act, 21 U.S.C. 790WIO-9(B)(3), unless the authorization is terminated or  revoked sooner. When diagnostic testing is negative, the possibility of a false negative result should be considered in the context of a patient's recent exposures and the presence of clinical signs and symptoms consistent with COVID-19. An individual without symptoms of COVID-19 and who is not shedding SARS-CoV-2 virus would expect to have a negative (not detected) result in this assay. Performed  At: [facility] [address] [place], [place] [id number] Rush Farmer MD ST:[id number]    [place]  Final    Comment: Performed at [facility], [place] [address] [place], [place] [address]    Please note: You were cared for by a hospitalist during your hospital stay. Once you are discharged, your primary care physician will handle any further medical issues. Please note that NO REFILLS for any discharge medications will be authorized once you are discharged, as it is imperative that you return to your primary care physician (or establish a relationship with a primary care physician if you do not have one) for your post hospital discharge needs so that they can reassess your need for medications and monitor your lab values.    Time coordinating discharge: 40 minutes  SIGNED:   Shelly Coss, MD  [facility] 04/20/2019, 1:08 PM Pager [phone]  If 7PM-7AM, please contact night-coverage [email or web address] Password [email or web address]

## 2019-04-20 NOTE — Progress Notes (Signed)
Inpatient Rehabilitation-Admissions Coordinator   Denial stands. AC notified pt of final determination and he says he would like to go home and feels he has the support to manage at home. AC has discussed with CM/SW. AC will sign off.   Please call if questions.   Jhonnie Garner, OTR/L  Rehab Admissions Coordinator  [phone] 04/20/2019 12:09 PM

## 2019-04-21 DIAGNOSIS — G4733 Obstructive sleep apnea (adult) (pediatric): Secondary | ICD-10-CM | POA: Diagnosis not present

## 2019-04-23 ENCOUNTER — Telehealth: Payer: Self-pay | Admitting: Neurology

## 2019-04-23 NOTE — Telephone Encounter (Signed)
I reached out to the pt and scheduled for 04/26/19 at 12 pm check in time of 1130 am.

## 2019-04-23 NOTE — Telephone Encounter (Signed)
Dr. Jannifer Franklin  Patient called in to schedule an hospital f/u with from a stroke, pt was seen by Dr. Erlinda Hong in the [facility]. You do not have any opening until September. Patient want's to f/u with you can we work the patient in sooner than September?

## 2019-04-26 ENCOUNTER — Encounter: Payer: Self-pay | Admitting: Neurology

## 2019-04-26 ENCOUNTER — Ambulatory Visit: Payer: Medicare HMO | Admitting: Neurology

## 2019-04-26 ENCOUNTER — Other Ambulatory Visit: Payer: Self-pay

## 2019-04-26 ENCOUNTER — Ambulatory Visit: Payer: Medicare HMO | Admitting: Primary Care

## 2019-04-26 VITALS — BP 129/70 | HR 72 | Temp 98.0°F | Wt 200.0 lb

## 2019-04-26 DIAGNOSIS — I639 Cerebral infarction, unspecified: Secondary | ICD-10-CM | POA: Diagnosis not present

## 2019-04-26 NOTE — Progress Notes (Signed)
Reason for visit: Stroke  Referring physician: [place]  [name] Love is a 48 y.o. male  History of present illness:  [name] Love is a 48 year old right-handed white male with a history of an ependymoma, status post radiation therapy and resection.  The patient has recently gone in the hospital on 16 April 2019 with onset of speech alteration, balance changes, and bilateral leg pain that began 3 days prior to admission.  The patient went to the emergency room and the initial MRI of the brain did not show evidence of acute stroke.  The patient was admitted for evaluation and underwent a contrast MRI the day after admission which showed a small area of enhancement in the right cerebellum consistent with a subacute stroke event.  The patient has been placed on a combination of aspirin and Plavix with plans to stop the aspirin after 3 weeks.  The patient was to go into inpatient rehab but insurance would not cover this, he has been set up for home health physical therapy which will start tomorrow.  The patient reports that he continues to have ongoing leg pain below the knees.  He takes Tylenol for this.  The patient also reports that he had a change in hearing, decreased hearing in the left ear around the time of the stroke.  The patient is now using a walker, he was walking independently.  The CTA of the head showed occlusion of the superior cerebellar artery on the right.  Past Medical History:  Diagnosis Date  . Abnormal gait   . Allergic rhinitis   . Anxiety   . Bilateral arm weakness   . Brain cancer ([place])   . Depression   . 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 Hearing loss, sensorineural   . Hemiparesis and alteration of sensations as late effects of stroke ([place]) 09/04/2018  . Hypothyroidism   . Insomnia   . Left leg weakness   . Low vitamin B12 level   . Seizures ([place])     Past Surgical History:  Procedure Laterality Date  . BRAIN SURGERY    . HERNIA REPAIR    . SHUNT REVISION      Family History  Problem Relation Age of Onset  . Brain cancer Mother   . High blood pressure Mother     Social history:  reports that he has never smoked. He has never used smokeless tobacco. He reports that he does not drink alcohol or use drugs.  Medications:  Prior to Admission medications   Medication Sig Start Date End Date Taking? Authorizing Provider  ALPRAZolam Duanne Moron) 0.5 MG tablet Take 0.5 mg by mouth 2 (two) times daily. 05/30/18  Yes [provider]  aspirin 81 MG EC tablet Take 1 tablet (81 mg total) by mouth daily. Please discontinue after 17 days 04/20/19  Yes Shelly Coss, MD  clopidogrel (PLAVIX) 75 MG tablet Take 1 tablet (75 mg total) by mouth daily. 04/21/19  Yes Shelly Coss, MD  topiramate (TOPAMAX) 50 MG tablet TAKE 1 TABLET BY MOUTH IN THE MORNING AND 2 TABLETS  IN THE EVENING Patient taking differently: Take 50-100 mg by mouth See admin instructions. TAKE 1 TABLET BY MOUTH IN THE MORNING AND 2 TABLETS  IN THE EVENING 03/28/19  Yes Kathrynn Ducking, MD      Allergies  Allergen Reactions  . Baclofen     Urinary retension  . Seroquel [Quetiapine]     Weight gain  . Sulfa Antibiotics   .  Coconut Flavor Rash    ANYTHING COCONUT  . Contrast Media [Iodinated Diagnostic Agents] Rash    ROS:  Out of a complete 14 system review of symptoms, the patient complains only of the following symptoms, and all other reviewed systems are negative.  Speech problems Leg pain Walking problems  Blood pressure 129/70, pulse 72, temperature 98 F (36.7 C), weight 200 lb (90.7 kg).  Physical Exam  General: The patient is alert and cooperative at the time of the examination.  Eyes: Pupils are equal, round, and reactive to light. Discs are flat bilaterally.  Neck: The neck is supple, no carotid bruits are noted.  Respiratory: The respiratory examination is clear.  Cardiovascular: The cardiovascular examination reveals a regular rate and rhythm, no obvious  murmurs or rubs are noted.  Skin: Extremities are without significant edema.  Neurologic Exam  Mental status: The patient is alert and oriented x 3 at the time of the examination. The patient has apparent normal recent and remote memory, with an apparently normal attention span and concentration ability.  Cranial nerves: Facial symmetry is present. There is good sensation of the face to pinprick and soft touch bilaterally. The strength of the facial muscles and the muscles to head turning and shoulder shrug are normal bilaterally. Speech is associated with some halting qualities, unusual inflection.  No aphasia is noted.Marland Kitchen Extraocular movements are full. Visual fields are full. The tongue is midline, and the patient has symmetric elevation of the soft palate. No obvious hearing deficits are noted.  Motor: The motor testing reveals 5 over 5 strength of all 4 extremities. Good symmetric motor tone is noted throughout.  Sensory: Sensory testing is intact to pinprick, soft touch and vibration sensation on all 4 extremities. No evidence of extinction is noted.  Coordination: Cerebellar testing reveals good finger-nose-finger and heel-to-shin bilaterally.  Gait and station: Gait is slightly wide-based, the patient has the ability to walk independently but is somewhat unsteady.  Tandem gait was not attempted.  Romberg is unsteady, with a tendency to fall.  Reflexes: Deep tendon reflexes are symmetric and normal bilaterally. Toes are downgoing bilaterally.   Assessment/Plan:  1.  Recent right cerebellar infarct  2.  Gait disturbance  3.  Reports of bilateral leg pain  The patient appears to have presented with a small subacute right cerebellar infarct.  He will eventually convert to Plavix alone.  He will be undergoing physical and Occupational Therapy.  The speech pattern is a bit unusual as is the walking pattern, it makes me somewhat suspicious of psychogenic overlay.  The patient reports  bilateral leg pain, I am not clear how this relates to the cerebrovascular disease.  The patient will follow-up for his next scheduled visit.   Jill Alexanders MD 04/26/2019 12:43 PM  [facility] [address] [place], [place] [address]  Phone [phone] Fax [phone]

## 2019-04-27 DIAGNOSIS — E039 Hypothyroidism, unspecified: Secondary | ICD-10-CM | POA: Diagnosis not present

## 2019-04-27 DIAGNOSIS — I69398 Other sequelae of cerebral infarction: Secondary | ICD-10-CM | POA: Diagnosis not present

## 2019-04-27 DIAGNOSIS — C719 Malignant neoplasm of brain, unspecified: Secondary | ICD-10-CM | POA: Diagnosis not present

## 2019-04-27 DIAGNOSIS — I69322 Dysarthria following cerebral infarction: Secondary | ICD-10-CM | POA: Diagnosis not present

## 2019-04-27 DIAGNOSIS — G40909 Epilepsy, unspecified, not intractable, without status epilepticus: Secondary | ICD-10-CM | POA: Diagnosis not present

## 2019-04-27 DIAGNOSIS — M6281 Muscle weakness (generalized): Secondary | ICD-10-CM | POA: Diagnosis not present

## 2019-04-27 DIAGNOSIS — G4733 Obstructive sleep apnea (adult) (pediatric): Secondary | ICD-10-CM | POA: Diagnosis not present

## 2019-04-27 DIAGNOSIS — R69 Illness, unspecified: Secondary | ICD-10-CM | POA: Diagnosis not present

## 2019-04-27 DIAGNOSIS — H903 Sensorineural hearing loss, bilateral: Secondary | ICD-10-CM | POA: Diagnosis not present

## 2019-04-30 DIAGNOSIS — C719 Malignant neoplasm of brain, unspecified: Secondary | ICD-10-CM | POA: Diagnosis not present

## 2019-04-30 DIAGNOSIS — M6281 Muscle weakness (generalized): Secondary | ICD-10-CM | POA: Diagnosis not present

## 2019-04-30 DIAGNOSIS — E039 Hypothyroidism, unspecified: Secondary | ICD-10-CM | POA: Diagnosis not present

## 2019-04-30 DIAGNOSIS — I69322 Dysarthria following cerebral infarction: Secondary | ICD-10-CM | POA: Diagnosis not present

## 2019-04-30 DIAGNOSIS — H903 Sensorineural hearing loss, bilateral: Secondary | ICD-10-CM | POA: Diagnosis not present

## 2019-04-30 DIAGNOSIS — I69398 Other sequelae of cerebral infarction: Secondary | ICD-10-CM | POA: Diagnosis not present

## 2019-04-30 DIAGNOSIS — R69 Illness, unspecified: Secondary | ICD-10-CM | POA: Diagnosis not present

## 2019-04-30 DIAGNOSIS — G4733 Obstructive sleep apnea (adult) (pediatric): Secondary | ICD-10-CM | POA: Diagnosis not present

## 2019-04-30 DIAGNOSIS — G40909 Epilepsy, unspecified, not intractable, without status epilepticus: Secondary | ICD-10-CM | POA: Diagnosis not present

## 2019-05-01 DIAGNOSIS — E039 Hypothyroidism, unspecified: Secondary | ICD-10-CM | POA: Diagnosis not present

## 2019-05-01 DIAGNOSIS — R69 Illness, unspecified: Secondary | ICD-10-CM | POA: Diagnosis not present

## 2019-05-01 DIAGNOSIS — C719 Malignant neoplasm of brain, unspecified: Secondary | ICD-10-CM | POA: Diagnosis not present

## 2019-05-01 DIAGNOSIS — I69322 Dysarthria following cerebral infarction: Secondary | ICD-10-CM | POA: Diagnosis not present

## 2019-05-01 DIAGNOSIS — I69398 Other sequelae of cerebral infarction: Secondary | ICD-10-CM | POA: Diagnosis not present

## 2019-05-01 DIAGNOSIS — G4733 Obstructive sleep apnea (adult) (pediatric): Secondary | ICD-10-CM | POA: Diagnosis not present

## 2019-05-01 DIAGNOSIS — G40909 Epilepsy, unspecified, not intractable, without status epilepticus: Secondary | ICD-10-CM | POA: Diagnosis not present

## 2019-05-01 DIAGNOSIS — M6281 Muscle weakness (generalized): Secondary | ICD-10-CM | POA: Diagnosis not present

## 2019-05-01 DIAGNOSIS — H903 Sensorineural hearing loss, bilateral: Secondary | ICD-10-CM | POA: Diagnosis not present

## 2019-05-04 DIAGNOSIS — I69322 Dysarthria following cerebral infarction: Secondary | ICD-10-CM | POA: Diagnosis not present

## 2019-05-04 DIAGNOSIS — C719 Malignant neoplasm of brain, unspecified: Secondary | ICD-10-CM | POA: Diagnosis not present

## 2019-05-04 DIAGNOSIS — R69 Illness, unspecified: Secondary | ICD-10-CM | POA: Diagnosis not present

## 2019-05-04 DIAGNOSIS — G4733 Obstructive sleep apnea (adult) (pediatric): Secondary | ICD-10-CM | POA: Diagnosis not present

## 2019-05-04 DIAGNOSIS — G40909 Epilepsy, unspecified, not intractable, without status epilepticus: Secondary | ICD-10-CM | POA: Diagnosis not present

## 2019-05-04 DIAGNOSIS — I69398 Other sequelae of cerebral infarction: Secondary | ICD-10-CM | POA: Diagnosis not present

## 2019-05-04 DIAGNOSIS — M6281 Muscle weakness (generalized): Secondary | ICD-10-CM | POA: Diagnosis not present

## 2019-05-04 DIAGNOSIS — E039 Hypothyroidism, unspecified: Secondary | ICD-10-CM | POA: Diagnosis not present

## 2019-05-04 DIAGNOSIS — H903 Sensorineural hearing loss, bilateral: Secondary | ICD-10-CM | POA: Diagnosis not present

## 2019-05-08 DIAGNOSIS — I69322 Dysarthria following cerebral infarction: Secondary | ICD-10-CM | POA: Diagnosis not present

## 2019-05-08 DIAGNOSIS — G40909 Epilepsy, unspecified, not intractable, without status epilepticus: Secondary | ICD-10-CM | POA: Diagnosis not present

## 2019-05-08 DIAGNOSIS — R69 Illness, unspecified: Secondary | ICD-10-CM | POA: Diagnosis not present

## 2019-05-08 DIAGNOSIS — E039 Hypothyroidism, unspecified: Secondary | ICD-10-CM | POA: Diagnosis not present

## 2019-05-08 DIAGNOSIS — M6281 Muscle weakness (generalized): Secondary | ICD-10-CM | POA: Diagnosis not present

## 2019-05-08 DIAGNOSIS — H903 Sensorineural hearing loss, bilateral: Secondary | ICD-10-CM | POA: Diagnosis not present

## 2019-05-08 DIAGNOSIS — G4733 Obstructive sleep apnea (adult) (pediatric): Secondary | ICD-10-CM | POA: Diagnosis not present

## 2019-05-08 DIAGNOSIS — I69398 Other sequelae of cerebral infarction: Secondary | ICD-10-CM | POA: Diagnosis not present

## 2019-05-08 DIAGNOSIS — C719 Malignant neoplasm of brain, unspecified: Secondary | ICD-10-CM | POA: Diagnosis not present

## 2019-05-09 DIAGNOSIS — R69 Illness, unspecified: Secondary | ICD-10-CM | POA: Diagnosis not present

## 2019-05-09 DIAGNOSIS — M6281 Muscle weakness (generalized): Secondary | ICD-10-CM | POA: Diagnosis not present

## 2019-05-09 DIAGNOSIS — I69322 Dysarthria following cerebral infarction: Secondary | ICD-10-CM | POA: Diagnosis not present

## 2019-05-09 DIAGNOSIS — C719 Malignant neoplasm of brain, unspecified: Secondary | ICD-10-CM | POA: Diagnosis not present

## 2019-05-09 DIAGNOSIS — H903 Sensorineural hearing loss, bilateral: Secondary | ICD-10-CM | POA: Diagnosis not present

## 2019-05-09 DIAGNOSIS — G40909 Epilepsy, unspecified, not intractable, without status epilepticus: Secondary | ICD-10-CM | POA: Diagnosis not present

## 2019-05-09 DIAGNOSIS — G4733 Obstructive sleep apnea (adult) (pediatric): Secondary | ICD-10-CM | POA: Diagnosis not present

## 2019-05-09 DIAGNOSIS — E039 Hypothyroidism, unspecified: Secondary | ICD-10-CM | POA: Diagnosis not present

## 2019-05-09 DIAGNOSIS — I69398 Other sequelae of cerebral infarction: Secondary | ICD-10-CM | POA: Diagnosis not present

## 2019-05-10 DIAGNOSIS — F0634 Mood disorder due to known physiological condition with mixed features: Secondary | ICD-10-CM | POA: Diagnosis not present

## 2019-05-10 DIAGNOSIS — R69 Illness, unspecified: Secondary | ICD-10-CM | POA: Diagnosis not present

## 2019-05-10 DIAGNOSIS — F064 Anxiety disorder due to known physiological condition: Secondary | ICD-10-CM | POA: Diagnosis not present

## 2019-05-11 ENCOUNTER — Telehealth: Payer: Self-pay | Admitting: Cardiology

## 2019-05-11 NOTE — Telephone Encounter (Signed)
Patient reports he recently went to the hospital and was started on plavix due to stroke like symptoms. He called neurology for refills but they instructed him that his cardiologist would need to prescribe. Will consult with Dr. Agustin Cree.

## 2019-05-11 NOTE — Telephone Encounter (Signed)
Left message for patient to return call.

## 2019-05-13 NOTE — Telephone Encounter (Signed)
Ok, strange, but we can do it

## 2019-05-15 MED ORDER — CLOPIDOGREL BISULFATE 75 MG PO TABS: 75.0000 mg | ORAL_TABLET | Freq: Every day | ORAL | 2 refills | Status: DC

## 2019-05-15 NOTE — Telephone Encounter (Signed)
Plavix 75 mg daily refilled.  

## 2019-05-21 ENCOUNTER — Ambulatory Visit: Payer: Self-pay | Admitting: Adult Health

## 2019-05-25 DIAGNOSIS — G4733 Obstructive sleep apnea (adult) (pediatric): Secondary | ICD-10-CM | POA: Diagnosis not present

## 2019-05-25 DIAGNOSIS — H903 Sensorineural hearing loss, bilateral: Secondary | ICD-10-CM | POA: Diagnosis not present

## 2019-05-25 DIAGNOSIS — M6281 Muscle weakness (generalized): Secondary | ICD-10-CM | POA: Diagnosis not present

## 2019-05-25 DIAGNOSIS — R69 Illness, unspecified: Secondary | ICD-10-CM | POA: Diagnosis not present

## 2019-05-25 DIAGNOSIS — E039 Hypothyroidism, unspecified: Secondary | ICD-10-CM | POA: Diagnosis not present

## 2019-05-25 DIAGNOSIS — C719 Malignant neoplasm of brain, unspecified: Secondary | ICD-10-CM | POA: Diagnosis not present

## 2019-05-25 DIAGNOSIS — I69322 Dysarthria following cerebral infarction: Secondary | ICD-10-CM | POA: Diagnosis not present

## 2019-05-25 DIAGNOSIS — G40909 Epilepsy, unspecified, not intractable, without status epilepticus: Secondary | ICD-10-CM | POA: Diagnosis not present

## 2019-05-25 DIAGNOSIS — I69398 Other sequelae of cerebral infarction: Secondary | ICD-10-CM | POA: Diagnosis not present

## 2019-05-29 ENCOUNTER — Ambulatory Visit: Payer: Self-pay | Admitting: Adult Health

## 2019-08-04 IMAGING — MR MR HEAD WO/W CM
12 of 15 series · 31 of 48 positions shown · non-contrast
Comparison: CT head without contrast 06/07/2018.

CLINICAL DATA: Four-day history of numbness involving the right
side of the face body. Weakness in the right upper and lower
extremity. Abnormal CT scan.

EXAM:
MRI HEAD WITHOUT CONTRAST
MRA HEAD WITHOUT CONTRAST
TECHNIQUE: Multiplanar, multiecho pulse sequences of the brain and surrounding
structures were obtained without intravenous contrast. Angiographic
images of the head were obtained using MRA technique without
contrast.

[Series 5: ax dwi_tracew · axial · 3.0mm · 1.50mm/px · z∈[-59,+86]mm · 7 of 100 slices shown]
[im 1/100]
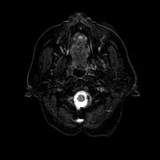
[im 17/100]
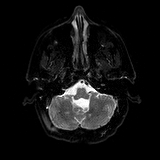
[im 34/100]
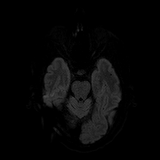
[im 50/100]
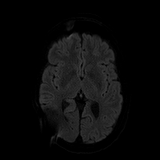
[im 67/100]
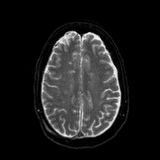
[im 83/100]
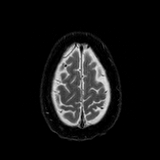
[im 100/100]
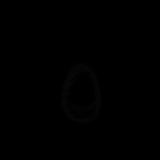

[Series 6: ax dwi_adc · axial · 3.0mm · 1.50mm/px · z∈[-59,+86]mm · 4 of 47 slices shown]
[im 1/47]
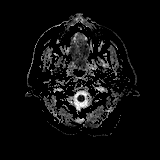
[im 16/47]
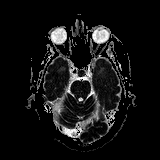
[im 31/47]
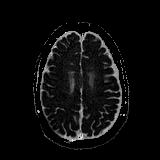
[im 47/47]
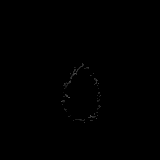

[Series 7: cor dwi_tracew · coronal · 5.0mm · 1.44mm/px · 4 of 72 slices shown]
[im 1/72]
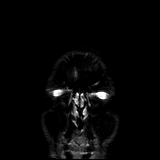
[im 24/72]
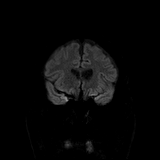
[im 48/72]
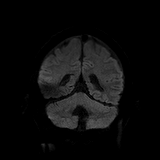
[im 72/72]
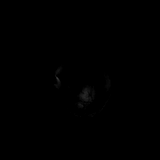

[Series 8: cor dwi_adc · coronal · 5.0mm · 1.44mm/px · 2 of 36 slices shown]
[im 1/36]
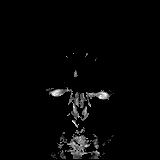
[im 36/36]
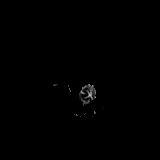

[Series 13: T1 · sagittal · 5.0mm · 0.75mm/px · 1 of 23 slices shown]
[im 1/23]
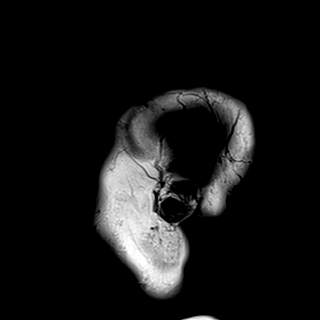

[Series 14: T2 · axial · 5.0mm · 0.72mm/px · 1 of 25 slices shown]
[im 1/25]
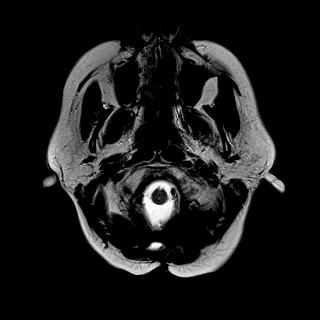

[Series 15: FLAIR · axial · 5.0mm · 0.45mm/px · 1 of 25 slices shown]
[im 1/25]
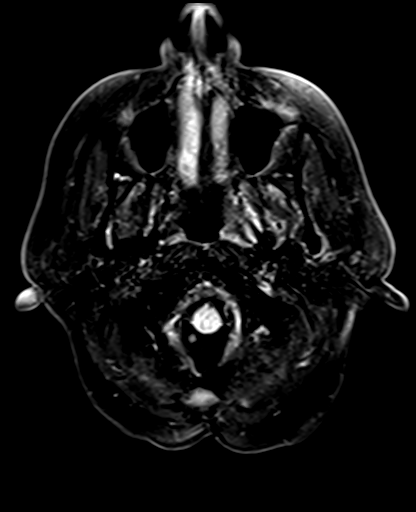

[Series 16: swi_images · axial · 3.0mm · 0.90mm/px · z∈[-74,+101]mm · 3 of 60 slices shown]
[im 1/60]
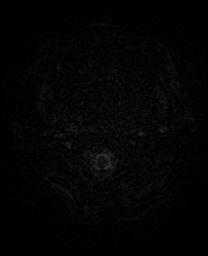
[im 30/60]
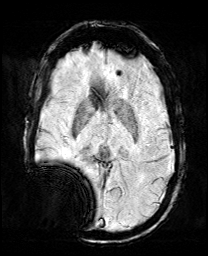
[im 60/60]
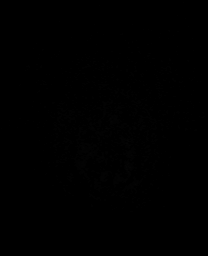

[Series 17: mip_images(sw) · axial · 24.0mm · 0.90mm/px · z∈[-64,+91]mm · 3 of 53 slices shown]
[im 1/53]
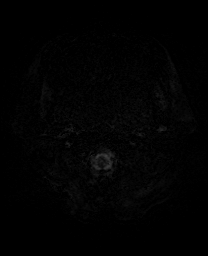
[im 27/53]
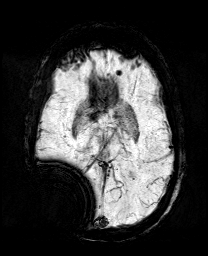
[im 53/53]
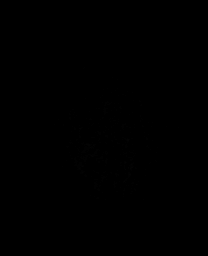

[Series 19: T2 post-contrast · coronal · 5.0mm · 0.72mm/px · 2 of 32 slices shown]
[im 1/32]
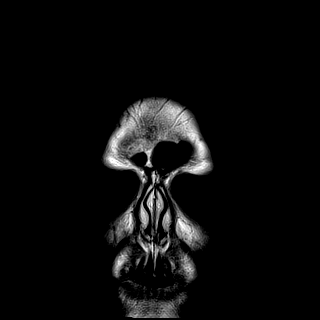
[im 32/32]
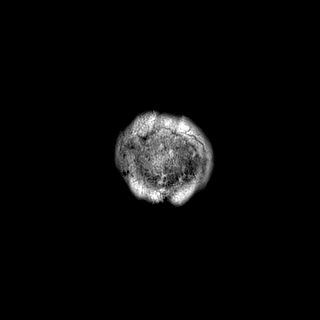

[Series 21: T1 post-contrast · coronal · 5.0mm · 0.34mm/px · 2 of 32 slices shown (1 of 2)]
[im 1/32]
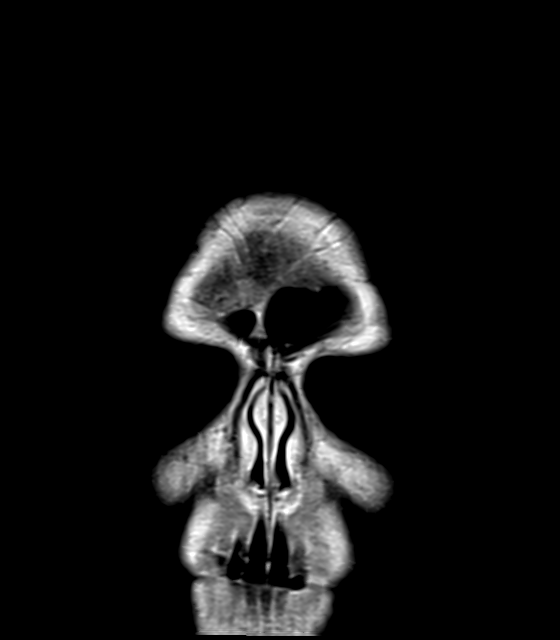
[im 32/32]
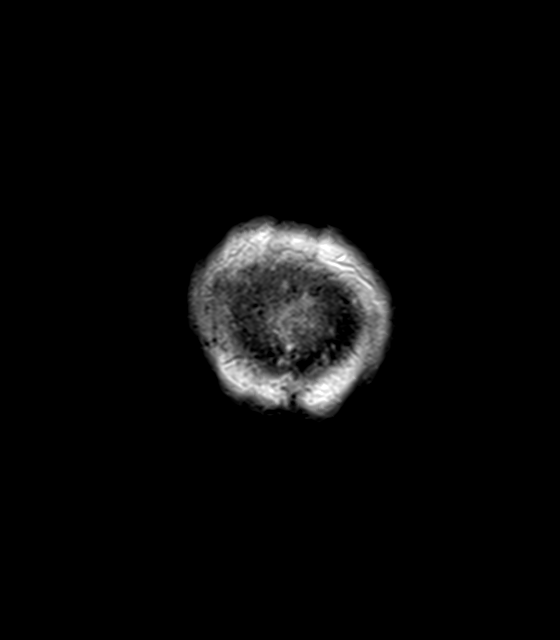

[Series 22: T1 post-contrast · sagittal · 5.0mm · 0.72mm/px · 1 of 23 slices shown (2 of 2)]
[im 1/23]
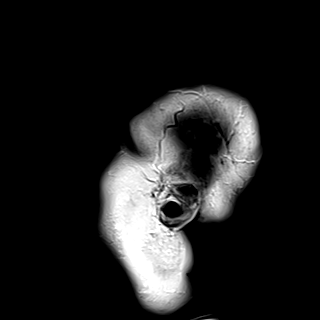

[31 of 48 positions shown; findings below may reference images not displayed]

FINDINGS: MRI HEAD FINDINGS

Brain: Diffusion-weighted images confirm an acute nonhemorrhagic
infarct involving the left thalamus measuring 7 mm maximally. A
punctate subcortical white matter infarct is also present in the
lateral left occipital lobe on image 70 of series 5. T2 signal
changes are associated with the infarct.

There is significant susceptibility from the programmable VP shunt
over the right parietal lobe.

Moderate periventricular white matter changes are present
bilaterally. There is a remote medial right thalamic infarct
measuring 7 mm.

Remote lacunar infarcts are present in the cerebellum bilaterally.
Postsurgical changes of the posterior fossa are again noted.

Postcontrast images demonstrate diffuse dural enhancement likely
related shunting. No pathologic enhancement is present otherwise.

Vascular: Flow is present in the major intracranial arteries.

Skull and upper cervical spine: The skull base is within normal
limits. Upper cervical spine is unremarkable. Marrow signal is
normal.

Sinuses/Orbits: The paranasal sinuses and mastoid air cells are
clear.

MRA HEAD FINDINGS

Internal carotid arteries demonstrate mild tortuosity the cervical
segment on the right. There is no significant stenosis from the high
cervical segments through the ICA termini bilaterally. The A1 and M1
segments are normal. The anterior communicating artery is patent.
MCA bifurcations are within normal limits. ACA and MCA branch
vessels are unremarkable.

The left vertebral artery is the dominant vessel. AICA vessels are
dominant bilaterally. Basilar artery is normal. Both posterior
cerebral arteries originate from the basilar tip. PCA branch vessels
are within normal limits.
IMPRESSION: 1. Acute nonhemorrhagic 7 mm infarct of the left thalamus is
confirmed.
2. Punctate acute nonhemorrhagic white matter infarct in the lateral
left occipital lobe.
3. Remote lacunar infarcts of the right thalamus and bilateral
cerebellum.
4. Postsurgical changes of suboccipital craniotomy and resection of
tumor without evidence for residual or recurrent disease.
5. Diffuse dural enhancement is likely related to shunting.
6. Normal variant MRA circle-of-Willis without significant proximal
stenosis, aneurysm, or branch vessel occlusion.

## 2019-08-16 ENCOUNTER — Other Ambulatory Visit: Payer: Self-pay | Admitting: Cardiology

## 2019-09-06 DIAGNOSIS — R69 Illness, unspecified: Secondary | ICD-10-CM | POA: Diagnosis not present

## 2019-09-06 DIAGNOSIS — F064 Anxiety disorder due to known physiological condition: Secondary | ICD-10-CM | POA: Diagnosis not present

## 2019-09-13 ENCOUNTER — Other Ambulatory Visit: Payer: Self-pay | Admitting: Cardiology

## 2019-10-07 NOTE — Progress Notes (Signed)
PATIENT: [name] Love DOB: 1970-12-21  REASON FOR VISIT: follow up HISTORY FROM: patient  HISTORY OF PRESENT ILLNESS: Today 10/08/19  [name] Love is a 49 year old male with history of ependymoma, status post radiation therapy and resection.  In the summer 2020, he suffered a small subacute right cerebellar infarct.  He remains on Plavix.  He also has history of seizures, he remains on Topamax.  He has not had recurrent seizure.  He continues to complain of chronic issues with his balance and gait instability.  He reports this is worse at night, he has had a few falls, but has not been injured.  He drives a car without difficulty.  He does not require assistive device for ambulation.  He lives with his aunt.  He continues to report some speech alteration since the stroke.  He is no longer in therapy.  He thinks his speech would have improved more if he would have been able to complete inpatient rehab therapy.  He reports he has some left leg weakness as result of the stroke.  He is hard of hearing.  He presents today for evaluation unaccompanied.  HISTORY 04/26/2019 Dr. Jannifer Love: [name] Love is a 49 year old right-handed white male with a history of an ependymoma, status post radiation therapy and resection.  The patient has recently gone in the hospital on 16 April 2019 with onset of speech alteration, balance changes, and bilateral leg pain that began 3 days prior to admission.  The patient went to the emergency room and the initial MRI of the brain did not show evidence of acute stroke.  The patient was admitted for evaluation and underwent a contrast MRI the day after admission which showed a small area of enhancement in the right cerebellum consistent with a subacute stroke event.  The patient has been placed on a combination of aspirin and Plavix with plans to stop the aspirin after 3 weeks.  The patient was to go into inpatient rehab but insurance would not cover this, he has been set up for home health  physical therapy which will start tomorrow.  The patient reports that he continues to have ongoing leg pain below the knees.  He takes Tylenol for this.  The patient also reports that he had a change in hearing, decreased hearing in the left ear around the time of the stroke.  The patient is now using a walker, he was walking independently.  The CTA of the head showed occlusion of the superior cerebellar artery on the right  REVIEW OF SYSTEMS: Out of a complete 14 system review of symptoms, the patient complains only of the following symptoms, and all other reviewed systems are negative.  Seizure  ALLERGIES: Allergies  Allergen Reactions  . Baclofen     Urinary retension  . Seroquel [Quetiapine]     Weight gain  . Sulfa Antibiotics   . Coconut Flavor Rash    ANYTHING COCONUT  . Contrast Media [Iodinated Diagnostic Agents] Rash    HOME MEDICATIONS: Outpatient Medications Prior to Visit  Medication Sig Dispense Refill  . acetaminophen (TYLENOL) 500 MG tablet Take 1,000 mg by mouth 2 (two) times daily.    Marland Kitchen ALPRAZolam (XANAX) 0.5 MG tablet Take 0.5 mg by mouth 2 (two) times daily.  2  . aspirin 81 MG EC tablet Take 1 tablet (81 mg total) by mouth daily. Please discontinue after 17 days 17 tablet 0  . clopidogrel (PLAVIX) 75 MG tablet Take 1 tablet by mouth once daily 90  tablet 0  . topiramate (TOPAMAX) 50 MG tablet TAKE 1 TABLET BY MOUTH IN THE MORNING AND 2 TABLETS  IN THE EVENING (Patient taking differently: Take 50-100 mg by mouth See admin instructions. TAKE 1 TABLET BY MOUTH IN THE MORNING AND 2 TABLETS  IN THE EVENING) 270 tablet 3   No facility-administered medications prior to visit.    PAST MEDICAL HISTORY: Past Medical History:  Diagnosis Date  . Abnormal gait   . Allergic rhinitis   . Anxiety   . Bilateral arm weakness   . Brain cancer ([place])   . Depression   . Ependymoma of brain ([place])   . Hearing loss, sensorineural   . Hemiparesis and alteration of sensations as  late effects of stroke ([place]) 09/04/2018  . Hypothyroidism   . Insomnia   . Left leg weakness   . Low vitamin B12 level   . Seizures ([place])     PAST SURGICAL HISTORY: Past Surgical History:  Procedure Laterality Date  . BRAIN SURGERY    . HERNIA REPAIR    . SHUNT REVISION      FAMILY HISTORY: Family History  Problem Relation Age of Onset  . Brain cancer Mother   . High blood pressure Mother     SOCIAL HISTORY: Social History   Socioeconomic History  . Marital status: Single    Spouse name: Not on file  . Number of children: 0  . Years of education: 17  . Highest education level: Not on file  Occupational History  . Occupation: Disabled  Tobacco Use  . Smoking status: Never Smoker  . Smokeless tobacco: Never Used  Substance and Sexual Activity  . Alcohol use: No  . Drug use: No  . Sexual activity: Not on file  Other Topics Concern  . Not on file  Social History Narrative   Lives with Aunt [name] Love)   Caffeine use: No coffee   Soda- trying to quit      Right-handed   Social Determinants of Health   Financial Resource Strain:   . Difficulty of Paying Living Expenses: Not on file  Food Insecurity:   . Worried About Charity fundraiser in the Last Year: Not on file  . Ran Out of Food in the Last Year: Not on file  Transportation Needs:   . Lack of Transportation (Medical): Not on file  . Lack of Transportation (Non-Medical): Not on file  Physical Activity:   . Days of Exercise per Week: Not on file  . Minutes of Exercise per Session: Not on file  Stress:   . Feeling of Stress : Not on file  Social Connections:   . Frequency of Communication with Friends and Family: Not on file  . Frequency of Social Gatherings with Friends and Family: Not on file  . Attends Religious Services: Not on file  . Active Member of Clubs or Organizations: Not on file  . Attends Archivist Meetings: Not on file  . Marital Status: Not on file  Intimate Partner  Violence:   . Fear of Current or Ex-Partner: Not on file  . Emotionally Abused: Not on file  . Physically Abused: Not on file  . Sexually Abused: Not on file    PHYSICAL EXAM  Vitals:   10/08/19 1058  BP: 117/82  Pulse: 69  Temp: (!) 97.2 F (36.2 C)  TempSrc: Oral  Weight: 190 lb 3.2 oz (86.3 kg)  Height: 5\' 8"  (1.727 m)   Body mass index  is 28.92 kg/m.  Generalized: Well developed, in no acute distress   Neurological examination  Mentation: Alert oriented to time, place, history taking. Follows all commands speech and language fluent (speech has somewhat of an accent, but is easily understandable) Cranial nerve II-XII: Pupils were equal round reactive to light. Extraocular movements were full, visual field were full on confrontational test. Facial sensation and strength were normal. Head turning and shoulder shrug  were normal and symmetric. Motor: The motor testing reveals 5 over 5 strength of all 4 extremities. Good symmetric motor tone is noted throughout.  Strong grip strength bilaterally. Sensory: Sensory testing is intact to soft touch on all 4 extremities. No evidence of extinction is noted.  Coordination: Cerebellar testing reveals good finger-nose-finger and heel-to-shin bilaterally.  Gait and station: Gait is somewhat wide-based, but steady, good pace, slight limping on the left. Tandem gait is normal. With Romberg he has tendency to fall backwards Reflexes: Deep tendon reflexes are symmetric and normal bilaterally.   DIAGNOSTIC DATA (LABS, IMAGING, TESTING) - I reviewed patient records, labs, notes, testing and imaging myself where available.  Lab Results  Component Value Date   WBC 5.9 04/17/2019   HGB 17.2 (H) 04/17/2019   HCT 50.2 04/17/2019   MCV 90.0 04/17/2019   PLT 200 04/17/2019      Component Value Date/Time   NA 139 04/17/2019 0810   NA 142 08/23/2017 1059   NA 142 11/06/2014 1751   K 3.7 04/17/2019 0810   K 3.7 11/06/2014 1751   CL 109  04/17/2019 0810   CL 106 11/06/2014 1751   CO2 20 (L) 04/17/2019 0810   CO2 29 11/06/2014 1751   GLUCOSE 83 04/17/2019 0810   GLUCOSE 91 11/06/2014 1751   BUN 12 04/17/2019 0810   BUN 17 08/23/2017 1059   BUN 14 11/06/2014 1751   CREATININE 1.31 (H) 04/17/2019 0810   CREATININE 1.11 11/06/2014 1751   CALCIUM 9.1 04/17/2019 0810   CALCIUM 8.8 11/06/2014 1751   PROT 6.6 04/17/2019 0810   PROT 6.5 08/23/2017 1059   PROT 6.9 11/06/2014 1751   ALBUMIN 4.0 04/17/2019 0810   ALBUMIN 4.5 08/23/2017 1059   ALBUMIN 3.8 11/06/2014 1751   AST 20 04/17/2019 0810   AST 33 11/06/2014 1751   ALT 29 04/17/2019 0810   ALT 55 11/06/2014 1751   ALKPHOS 81 04/17/2019 0810   ALKPHOS 89 11/06/2014 1751   BILITOT 0.9 04/17/2019 0810   BILITOT <0.2 08/23/2017 1059   BILITOT 0.4 11/06/2014 1751   GFRNONAA >60 04/17/2019 0810   GFRNONAA >60 11/06/2014 1751   GFRNONAA >60 03/12/2014 1140   GFRAA >60 04/17/2019 0810   GFRAA >60 11/06/2014 1751   GFRAA >60 03/12/2014 1140   Lab Results  Component Value Date   CHOL 119 04/17/2019   HDL 34 (L) 04/17/2019   LDLCALC 64 04/17/2019   TRIG 104 04/17/2019   CHOLHDL 3.5 04/17/2019   Lab Results  Component Value Date   HGBA1C 5.3 04/17/2019   Lab Results  Component Value Date   VITAMINB12 238 04/17/2019   Lab Results  Component Value Date   TSH 4.294 04/17/2019    ASSESSMENT AND PLAN 49 y.o. year old male  has a past medical history of Abnormal gait, Allergic rhinitis, Anxiety, Bilateral arm weakness, Brain cancer ([place]), Depression, Ependymoma of brain ([place]), Hearing loss, sensorineural, Hemiparesis and alteration of sensations as late effects of stroke ([place]) (09/04/2018), Hypothyroidism, Insomnia, Left leg weakness, Low vitamin B12 level, and Seizures (  [place]). here with:  1.  Right cerebellar infarct 2.  Gait disturbance 3.  History of seizures  Overall, he has done fairly well.  He has completed therapy following his stroke.  He feels he is  left with some mild speech deficit and weakness to his left leg.  He has chronic issues with balance and gait instability. He will remain on Plavix, following his stroke.  He will continue taking Topamax for seizures 50 mg, 1 in the morning, 2 in the evening.  He will continue to follow with his primary doctor for management of vascular risk factors.  He will follow-up in 8 months or sooner if needed.  I did advise if his symptoms worsen or if he develops any new symptoms he should let us know.  I spent 15 minutes with the patient. 50% of this time was spent discussing his plan of care.  Butler Denmark, AGNP-C, DNP 10/08/2019, 12:34 PM [facility] [address], [place], [place] [address] [phone]

## 2019-10-08 ENCOUNTER — Ambulatory Visit: Payer: Medicare Other | Admitting: Neurology

## 2019-10-08 ENCOUNTER — Encounter: Payer: Self-pay | Admitting: Neurology

## 2019-10-08 ENCOUNTER — Other Ambulatory Visit: Payer: Self-pay

## 2019-10-08 VITALS — BP 117/82 | HR 69 | Temp 97.2°F | Ht 68.0 in | Wt 190.2 lb

## 2019-10-08 DIAGNOSIS — I639 Cerebral infarction, unspecified: Secondary | ICD-10-CM | POA: Diagnosis not present

## 2019-10-08 DIAGNOSIS — R569 Unspecified convulsions: Secondary | ICD-10-CM | POA: Diagnosis not present

## 2019-10-08 NOTE — Patient Instructions (Signed)
Continue Topamax for seizures, call for recurrent seizure. Remain on Plavix, continue follow-up with your primary doctor for routine management of BP, cholesterol check, etc. Return in 8 months or sooner if needed.

## 2019-10-09 NOTE — Progress Notes (Signed)
I have read the note, and I agree with the clinical assessment and plan.  Raji Glinski K Jakeline Dave   

## 2019-10-17 DIAGNOSIS — H524 Presbyopia: Secondary | ICD-10-CM | POA: Diagnosis not present

## 2019-10-31 DIAGNOSIS — H524 Presbyopia: Secondary | ICD-10-CM | POA: Diagnosis not present

## 2019-11-29 ENCOUNTER — Telehealth: Payer: Self-pay | Admitting: Cardiology

## 2019-11-29 MED ORDER — CLOPIDOGREL BISULFATE 75 MG PO TABS: 75.0000 mg | ORAL_TABLET | Freq: Every day | ORAL | 1 refills | Status: DC

## 2019-11-29 NOTE — Telephone Encounter (Signed)
Pt c/o medication issue:  1. Name of Medication: clopidogrel (PLAVIX) 75 MG tablet  2. How are you currently taking this medication (dosage and times per day)? Once a day  3. Are you having a reaction (difficulty breathing--STAT)? no  4. What is your medication issue? Patient calling to find out if he needs to make an appointment for further refills. Please advise.

## 2020-01-21 DIAGNOSIS — F482 Pseudobulbar affect: Secondary | ICD-10-CM | POA: Diagnosis not present

## 2020-01-21 DIAGNOSIS — F064 Anxiety disorder due to known physiological condition: Secondary | ICD-10-CM | POA: Diagnosis not present

## 2020-01-28 DIAGNOSIS — Z0000 Encounter for general adult medical examination without abnormal findings: Secondary | ICD-10-CM | POA: Diagnosis not present

## 2020-01-28 DIAGNOSIS — F419 Anxiety disorder, unspecified: Secondary | ICD-10-CM | POA: Diagnosis not present

## 2020-01-28 DIAGNOSIS — Z8673 Personal history of transient ischemic attack (TIA), and cerebral infarction without residual deficits: Secondary | ICD-10-CM | POA: Diagnosis not present

## 2020-01-28 DIAGNOSIS — G40909 Epilepsy, unspecified, not intractable, without status epilepticus: Secondary | ICD-10-CM | POA: Diagnosis not present

## 2020-02-15 DIAGNOSIS — H903 Sensorineural hearing loss, bilateral: Secondary | ICD-10-CM | POA: Diagnosis not present

## 2020-02-18 DIAGNOSIS — Z23 Encounter for immunization: Secondary | ICD-10-CM | POA: Diagnosis not present

## 2020-02-27 DIAGNOSIS — Z0289 Encounter for other administrative examinations: Secondary | ICD-10-CM

## 2020-03-05 ENCOUNTER — Telehealth: Payer: Self-pay | Admitting: *Deleted

## 2020-03-05 NOTE — Telephone Encounter (Signed)
I called and LMVM for pt asking if we have completed form for him previously.  (unum disability).

## 2020-03-05 NOTE — Telephone Encounter (Signed)
Form to be completed

## 2020-03-11 NOTE — Telephone Encounter (Signed)
Form to be reviewed and completed by NP then singed.

## 2020-03-11 NOTE — Telephone Encounter (Signed)
Disability form signed, given to [place].

## 2020-03-13 ENCOUNTER — Telehealth: Payer: Self-pay | Admitting: *Deleted

## 2020-03-13 NOTE — Telephone Encounter (Signed)
Pt unum form faxed on 03/12/20.

## 2020-04-01 DIAGNOSIS — IMO0001 Reserved for inherently not codable concepts without codable children: Secondary | ICD-10-CM

## 2020-04-01 DIAGNOSIS — Z9889 Other specified postprocedural states: Secondary | ICD-10-CM | POA: Diagnosis not present

## 2020-04-01 DIAGNOSIS — H903 Sensorineural hearing loss, bilateral: Secondary | ICD-10-CM | POA: Diagnosis not present

## 2020-04-01 DIAGNOSIS — Z86011 Personal history of benign neoplasm of the brain: Secondary | ICD-10-CM | POA: Diagnosis not present

## 2020-04-01 DIAGNOSIS — Z8673 Personal history of transient ischemic attack (TIA), and cerebral infarction without residual deficits: Secondary | ICD-10-CM

## 2020-04-01 DIAGNOSIS — Z531 Procedure and treatment not carried out because of patient's decision for reasons of belief and group pressure: Secondary | ICD-10-CM

## 2020-04-01 DIAGNOSIS — Z45321 Encounter for adjustment and management of cochlear device: Secondary | ICD-10-CM | POA: Diagnosis not present

## 2020-04-01 HISTORY — DX: Procedure and treatment not carried out because of patient's decision for reasons of belief and group pressure: Z53.1

## 2020-04-01 HISTORY — DX: Reserved for inherently not codable concepts without codable children: IMO0001

## 2020-04-01 HISTORY — DX: Personal history of transient ischemic attack (TIA), and cerebral infarction without residual deficits: Z86.73

## 2020-04-03 ENCOUNTER — Other Ambulatory Visit: Payer: Self-pay | Admitting: Neurology

## 2020-04-03 NOTE — Telephone Encounter (Signed)
Pt has called for a refill on his topiramate (TOPAMAX) 50 MG tablet to [facility] [phone]

## 2020-04-04 ENCOUNTER — Other Ambulatory Visit: Payer: Self-pay | Admitting: Neurology

## 2020-04-04 MED ORDER — TOPIRAMATE 50 MG PO TABS: ORAL_TABLET | ORAL | 3 refills | Status: DC

## 2020-04-04 NOTE — Progress Notes (Signed)
I received message from answering service that patient`s seizure medicine had not been called in. I reviewed the chart and refilled topamax. I tried calling pt back but unable to leave message and he did not pick up his phone

## 2020-04-09 NOTE — Telephone Encounter (Signed)
Topamax refilled on 04/04/20.

## 2020-04-16 DIAGNOSIS — R569 Unspecified convulsions: Secondary | ICD-10-CM | POA: Diagnosis not present

## 2020-04-16 DIAGNOSIS — Z923 Personal history of irradiation: Secondary | ICD-10-CM | POA: Diagnosis not present

## 2020-04-16 DIAGNOSIS — Z8673 Personal history of transient ischemic attack (TIA), and cerebral infarction without residual deficits: Secondary | ICD-10-CM | POA: Diagnosis not present

## 2020-04-16 DIAGNOSIS — Z7901 Long term (current) use of anticoagulants: Secondary | ICD-10-CM | POA: Diagnosis not present

## 2020-04-16 DIAGNOSIS — H903 Sensorineural hearing loss, bilateral: Secondary | ICD-10-CM | POA: Diagnosis not present

## 2020-04-17 DIAGNOSIS — R569 Unspecified convulsions: Secondary | ICD-10-CM | POA: Diagnosis not present

## 2020-04-17 DIAGNOSIS — H903 Sensorineural hearing loss, bilateral: Secondary | ICD-10-CM | POA: Diagnosis not present

## 2020-04-21 DIAGNOSIS — F064 Anxiety disorder due to known physiological condition: Secondary | ICD-10-CM | POA: Diagnosis not present

## 2020-04-21 DIAGNOSIS — F482 Pseudobulbar affect: Secondary | ICD-10-CM | POA: Diagnosis not present

## 2020-04-24 DIAGNOSIS — H903 Sensorineural hearing loss, bilateral: Secondary | ICD-10-CM | POA: Diagnosis not present

## 2020-05-15 DIAGNOSIS — Z45321 Encounter for adjustment and management of cochlear device: Secondary | ICD-10-CM | POA: Diagnosis not present

## 2020-05-15 DIAGNOSIS — Z9621 Cochlear implant status: Secondary | ICD-10-CM | POA: Insufficient documentation

## 2020-05-15 DIAGNOSIS — H903 Sensorineural hearing loss, bilateral: Secondary | ICD-10-CM | POA: Diagnosis not present

## 2020-05-15 DIAGNOSIS — Z9889 Other specified postprocedural states: Secondary | ICD-10-CM | POA: Diagnosis not present

## 2020-05-15 DIAGNOSIS — Z48810 Encounter for surgical aftercare following surgery on the sense organs: Secondary | ICD-10-CM | POA: Diagnosis not present

## 2020-05-15 DIAGNOSIS — Z923 Personal history of irradiation: Secondary | ICD-10-CM | POA: Diagnosis not present

## 2020-05-15 HISTORY — DX: Cochlear implant status: Z96.21

## 2020-05-27 ENCOUNTER — Other Ambulatory Visit: Payer: Self-pay | Admitting: Cardiology

## 2020-05-27 NOTE — Telephone Encounter (Signed)
Rx refill sent to pharmacy. 

## 2020-05-29 DIAGNOSIS — H903 Sensorineural hearing loss, bilateral: Secondary | ICD-10-CM | POA: Diagnosis not present

## 2020-06-04 ENCOUNTER — Ambulatory Visit: Payer: Medicare Other | Admitting: Cardiology

## 2020-06-04 ENCOUNTER — Encounter: Payer: Self-pay | Admitting: Cardiology

## 2020-06-04 ENCOUNTER — Ambulatory Visit (INDEPENDENT_AMBULATORY_CARE_PROVIDER_SITE_OTHER): Payer: Medicare Other

## 2020-06-04 ENCOUNTER — Other Ambulatory Visit: Payer: Self-pay

## 2020-06-04 VITALS — BP 129/89 | HR 80 | Ht 68.0 in | Wt 180.0 lb

## 2020-06-04 DIAGNOSIS — I493 Ventricular premature depolarization: Secondary | ICD-10-CM | POA: Diagnosis not present

## 2020-06-04 DIAGNOSIS — R0789 Other chest pain: Secondary | ICD-10-CM | POA: Diagnosis not present

## 2020-06-04 DIAGNOSIS — R0609 Other forms of dyspnea: Secondary | ICD-10-CM

## 2020-06-04 DIAGNOSIS — R0600 Dyspnea, unspecified: Secondary | ICD-10-CM

## 2020-06-04 DIAGNOSIS — R569 Unspecified convulsions: Secondary | ICD-10-CM

## 2020-06-04 HISTORY — DX: Ventricular premature depolarization: I49.3

## 2020-06-04 NOTE — Progress Notes (Signed)
Cardiology Office Note:    Date:  06/04/2020   ID:  Lois Huxley, DOB [birth year], MRN [id number]  PCP:  Leonides Sake, MD  Cardiologist:  Jenne Campus, MD    Referring MD: No ref. provider found   Chief Complaint  Patient presents with  . Annual Exam  Doing well  History of Present Illness:    [name] Love is a 49 y.o. male with brain cancer status post surgery status post radiation, atypical chest pain with negative work-up, dyspnea exertion.  Overall he improved he lost significant amount of weight and doing much better.  Denies having any cardiac complaints.  Past Medical History:  Diagnosis Date  . Abnormal gait   . Allergic rhinitis   . Anxiety   . Bilateral arm weakness   . Brain cancer ([place])   . Depression   . 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 Hearing loss, sensorineural   . Hemiparesis and alteration of sensations as late effects of stroke ([place]) 09/04/2018  . Hypothyroidism   . Insomnia   . Left leg weakness   . Low vitamin B12 level   . Seizures ([place])     Past Surgical History:  Procedure Laterality Date  . BRAIN SURGERY    . HERNIA REPAIR    . SHUNT REVISION      Current Medications: Current Meds  Medication Sig  . acetaminophen (TYLENOL) 500 MG tablet Take 1,000 mg by mouth 2 (two) times daily.  Marland Kitchen ALPRAZolam (XANAX) 0.5 MG tablet Take 0.5 mg by mouth 2 (two) times daily.  . clopidogrel (PLAVIX) 75 MG tablet TAKE 1 TABLET(75 MG) BY MOUTH DAILY  . topiramate (TOPAMAX) 50 MG tablet TAKE 1 TABLET BY MOUTH IN THE MORNING AND 2 TABLETS  IN THE EVENING     Allergies:   Baclofen, Seroquel [quetiapine], Sulfa antibiotics, Coconut flavor, and Contrast media [iodinated diagnostic agents]   Social History   Socioeconomic History  . Marital status: Single    Spouse name: Not on file  . Number of children: 0  . Years of education: 40  . Highest education level: Not on file  Occupational History  . Occupation: Disabled  Tobacco Use  . Smoking status: Never  Smoker  . Smokeless tobacco: Never Used  Vaping Use  . Vaping Use: Never used  Substance and Sexual Activity  . Alcohol use: No  . Drug use: No  . Sexual activity: Not on file  Other Topics Concern  . Not on file  Social History Narrative   Lives with Aunt [name])   Caffeine use: No coffee   Soda- trying to quit      Right-handed   Social Determinants of Health   Financial Resource Strain:   . Difficulty of Paying Living Expenses: Not on file  Food Insecurity:   . Worried About Charity fundraiser in the Last Year: Not on file  . Ran Out of Food in the Last Year: Not on file  Transportation Needs:   . Lack of Transportation (Medical): Not on file  . Lack of Transportation (Non-Medical): Not on file  Physical Activity:   . Days of Exercise per Week: Not on file  . Minutes of Exercise per Session: Not on file  Stress:   . Feeling of Stress : Not on file  Social Connections:   . Frequency of Communication with Friends and Family: Not on file  . Frequency of Social Gatherings with Friends and Family: Not on file  .  Attends Religious Services: Not on file  . Active Member of Clubs or Organizations: Not on file  . Attends Archivist Meetings: Not on file  . Marital Status: Not on file     Family History: The patient's family history includes Brain cancer in his mother; High blood pressure in his mother. ROS:   Please see the history of present illness.    All 14 point review of systems negative except as described per history of present illness  EKGs/Labs/Other Studies Reviewed:      Recent Labs: No results found for requested labs within last 8760 hours.  Recent Lipid Panel    Component Value Date/Time   CHOL 119 04/17/2019 0810   TRIG 104 04/17/2019 0810   HDL 34 (L) 04/17/2019 0810   CHOLHDL 3.5 04/17/2019 0810   VLDL 21 04/17/2019 0810   LDLCALC 64 04/17/2019 0810    Physical Exam:    VS:  BP 129/89 (BP Location: Right Arm, Patient  Position: Sitting, Cuff Size: Normal)   Pulse 80   Ht 5\' 8"  (1.727 m)   Wt 180 lb (81.6 kg)   SpO2 97%   BMI 27.37 kg/m     Wt Readings from Last 3 Encounters:  06/04/20 180 lb (81.6 kg)  10/08/19 190 lb 3.2 oz (86.3 kg)  04/26/19 200 lb (90.7 kg)     GEN:  Well nourished, well developed in no acute distress HEENT: Normal NECK: No JVD; No carotid bruits LYMPHATICS: No lymphadenopathy CARDIAC: RRR, no murmurs, no rubs, no gallops RESPIRATORY:  Clear to auscultation without rales, wheezing or rhonchi  ABDOMEN: Soft, non-tender, non-distended MUSCULOSKELETAL:  No edema; No deformity  SKIN: Warm and dry LOWER EXTREMITIES: no swelling NEUROLOGIC:  Alert and oriented x 3 PSYCHIATRIC:  Normal affect   ASSESSMENT:    1. Atypical chest pain   2. Dyspnea on exertion   3. Seizure ([place])   4. Ventricular ectopy    PLAN:    In order of problems listed above:  1. Atypical chest pain: Denies having any. 2. Dyspnea exertion: Much better right now when he lost significant amount of weight, prior work-up including echocardiogram was negative. 3. Seizure disorder: That is followed by neurology. 4. Ventricular ectopy: That is a new problem and kind of incidental discovery.  He did have EKG done today which showed normal sinus rhythm normal P interval however he does have ventricular trigeminy.  Denies have any dizziness or passing out and overall seems to be doing well.  I will ask him to wear Zio patch for a week and see how much of ectopy he has.  If there would be significant burden 10 echocardiogram need to be repeated.   Medication Adjustments/Labs and Tests Ordered: Current medicines are reviewed at length with the patient today.  Concerns regarding medicines are outlined above.  No orders of the defined types were placed in this encounter.  Medication changes: No orders of the defined types were placed in this encounter.   Signed, Park Liter, MD, [facility] 06/04/2020 3:03 PM     [place]

## 2020-06-04 NOTE — Patient Instructions (Signed)
Medication Instructions:  Your physician recommends that you continue on your current medications as directed. Please refer to the Current Medication list given to you today.  *If you need a refill on your cardiac medications before your next appointment, please call your pharmacy*   Lab Work: None If you have labs (blood work) drawn today and your tests are completely normal, you will receive your results only by: . MyChart Message (if you have MyChart) OR . A paper copy in the mail If you have any lab test that is abnormal or we need to change your treatment, we will call you to review the results.   Testing/Procedures: A zio monitor was ordered today. It will remain on for 7 days. You will then return monitor and event diary in provided box. It takes 1-2 weeks for report to be downloaded and returned to us. We will call you with the results. If monitor falls off or has orange flashing light, please call Zio for further instructions.      Follow-Up: At [facility], you and your health needs are our priority.  As part of our continuing mission to provide you with exceptional heart care, we have created designated Provider Care Teams.  These Care Teams include your primary Cardiologist (physician) and Advanced Practice Providers (APPs -  Physician Assistants and Nurse Practitioners) who all work together to provide you with the care you need, when you need it.  We recommend signing up for the patient portal called "MyChart".  Sign up information is provided on this After Visit Summary.  MyChart is used to connect with patients for Virtual Visits (Telemedicine).  Patients are able to view lab/test results, encounter notes, upcoming appointments, etc.  Non-urgent messages can be sent to your provider as well.   To learn more about what you can do with MyChart, go to [email or web address].com.    Your next appointment:   1 year(s)  The format for your next appointment:   In  Person  Provider:   Robert Krasowski, MD   Other Instructions   

## 2020-06-04 NOTE — Addendum Note (Signed)
Addended by: Ashok Norris on: 06/04/2020 03:16 PM   Modules accepted: Orders

## 2020-06-06 DIAGNOSIS — R358 Other polyuria: Secondary | ICD-10-CM | POA: Diagnosis not present

## 2020-06-06 DIAGNOSIS — R32 Unspecified urinary incontinence: Secondary | ICD-10-CM | POA: Diagnosis not present

## 2020-06-06 DIAGNOSIS — Z6826 Body mass index (BMI) 26.0-26.9, adult: Secondary | ICD-10-CM | POA: Diagnosis not present

## 2020-06-06 DIAGNOSIS — Z125 Encounter for screening for malignant neoplasm of prostate: Secondary | ICD-10-CM | POA: Diagnosis not present

## 2020-06-10 ENCOUNTER — Ambulatory Visit: Payer: Medicare Other | Admitting: Neurology

## 2020-06-10 ENCOUNTER — Other Ambulatory Visit: Payer: Self-pay

## 2020-06-10 ENCOUNTER — Encounter: Payer: Self-pay | Admitting: Neurology

## 2020-06-10 VITALS — BP 120/78 | HR 91 | Wt 175.0 lb

## 2020-06-10 DIAGNOSIS — I639 Cerebral infarction, unspecified: Secondary | ICD-10-CM

## 2020-06-10 DIAGNOSIS — R569 Unspecified convulsions: Secondary | ICD-10-CM

## 2020-06-10 NOTE — Patient Instructions (Addendum)
Continue current medications Continue follow-up with your primary doctor See you back in 8 months

## 2020-06-10 NOTE — Progress Notes (Signed)
PATIENT: [name] Love DOB: 07/22/1971  REASON FOR VISIT: follow up HISTORY FROM: patient  HISTORY OF PRESENT ILLNESS: Today 06/10/20 [name] Love is a 49 year old male with a history of ependymoma, status post radiation therapy and resection.  In summer 2020, he had small subacute right cerebellar infarct.  He is on Plavix.  He is on Topamax for seizures.  Has chronic issues with balance and gait instability.  He had a left cochlear implant placed recently, has a service dog with him today.  Seeing cardiology, currently wearing a heart monitor.  No recurrent seizure.  He is living with his aunt, but indicates he will be living alone soon for a better home environment.  He is able to drive, but not at night.  Has chronic weakness to the left leg, speech alteration.  He is being evaluated for possible prostate cancer.  He presents today for evaluation.  HISTORY  10/08/2019 SS: [name] Love is a 49 year old male with history of ependymoma, status post radiation therapy and resection.  In the summer 2020, he suffered a small subacute right cerebellar infarct.  He remains on Plavix.  He also has history of seizures, he remains on Topamax.  He has not had recurrent seizure.  He continues to complain of chronic issues with his balance and gait instability.  He reports this is worse at night, he has had a few falls, but has not been injured.  He drives a car without difficulty.  He does not require assistive device for ambulation.  He lives with his aunt.  He continues to report some speech alteration since the stroke.  He is no longer in therapy.  He thinks his speech would have improved more if he would have been able to complete inpatient rehab therapy.  He reports he has some left leg weakness as result of the stroke.  He is hard of hearing.  He presents today for evaluation unaccompanied.  REVIEW OF SYSTEMS: Out of a complete 14 system review of symptoms, the patient complains only of the following symptoms, and  all other reviewed systems are negative.  Walking difficulty  ALLERGIES: Allergies  Allergen Reactions  . Baclofen     Urinary retension  . Seroquel [Quetiapine]     Weight gain  . Sulfa Antibiotics   . Coconut Flavor Rash    ANYTHING COCONUT  . Contrast Media [Iodinated Diagnostic Agents] Rash    HOME MEDICATIONS: Outpatient Medications Prior to Visit  Medication Sig Dispense Refill  . acetaminophen (TYLENOL) 500 MG tablet Take 1,000 mg by mouth 2 (two) times daily.    Marland Kitchen ALPRAZolam (XANAX) 0.5 MG tablet Take 0.5 mg by mouth 2 (two) times daily.  2  . clopidogrel (PLAVIX) 75 MG tablet TAKE 1 TABLET(75 MG) BY MOUTH DAILY 90 tablet 0  . topiramate (TOPAMAX) 50 MG tablet TAKE 1 TABLET BY MOUTH IN THE MORNING AND 2 TABLETS  IN THE EVENING 270 tablet 3   No facility-administered medications prior to visit.    PAST MEDICAL HISTORY: Past Medical History:  Diagnosis Date  . Abnormal gait   . Allergic rhinitis   . Anxiety   . Bilateral arm weakness   . Brain cancer ([place])   . Depression   . 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 Hearing loss, sensorineural   . Hemiparesis and alteration of sensations as late effects of stroke ([place]) 09/04/2018  . Hypothyroidism   . Insomnia   . Left leg weakness   . Low vitamin  B12 level   . Seizures ([place])     PAST SURGICAL HISTORY: Past Surgical History:  Procedure Laterality Date  . BRAIN SURGERY    . HERNIA REPAIR    . SHUNT REVISION      FAMILY HISTORY: Family History  Problem Relation Age of Onset  . Brain cancer Mother   . High blood pressure Mother     SOCIAL HISTORY: Social History   Socioeconomic History  . Marital status: Single    Spouse name: Not on file  . Number of children: 0  . Years of education: 18  . Highest education level: Not on file  Occupational History  . Occupation: Disabled  Tobacco Use  . Smoking status: Never Smoker  . Smokeless tobacco: Never Used  Vaping Use  . Vaping Use: Never used  Substance  and Sexual Activity  . Alcohol use: No  . Drug use: No  . Sexual activity: Not on file  Other Topics Concern  . Not on file  Social History Narrative   Lives with Aunt [name])   Caffeine use: No coffee   Soda- trying to quit      Right-handed   Social Determinants of Health   Financial Resource Strain:   . Difficulty of Paying Living Expenses: Not on file  Food Insecurity:   . Worried About Charity fundraiser in the Last Year: Not on file  . Ran Out of Food in the Last Year: Not on file  Transportation Needs:   . Lack of Transportation (Medical): Not on file  . Lack of Transportation (Non-Medical): Not on file  Physical Activity:   . Days of Exercise per Week: Not on file  . Minutes of Exercise per Session: Not on file  Stress:   . Feeling of Stress : Not on file  Social Connections:   . Frequency of Communication with Friends and Family: Not on file  . Frequency of Social Gatherings with Friends and Family: Not on file  . Attends Religious Services: Not on file  . Active Member of Clubs or Organizations: Not on file  . Attends Archivist Meetings: Not on file  . Marital Status: Not on file  Intimate Partner Violence:   . Fear of Current or Ex-Partner: Not on file  . Emotionally Abused: Not on file  . Physically Abused: Not on file  . Sexually Abused: Not on file   PHYSICAL EXAM  Vitals:   06/10/20 1117  BP: 120/78  Pulse: 91  Weight: 175 lb (79.4 kg)   Body mass index is 26.61 kg/m.  Generalized: Well developed, in no acute distress   Neurological examination  Mentation: Alert oriented to time, place, history taking. Follows all commands speech and language fluent (speech sounds reminiscent of accent) Cranial nerve II-XII: Pupils were equal round reactive to light. Extraocular movements were full, visual field were full on confrontational test. Facial sensation and strength were normal.  Head turning and shoulder shrug were normal and  symmetric. Motor: The motor testing reveals 5 over 5 strength of all 4 extremities. Good symmetric motor tone is noted throughout.  Sensory: Sensory testing is intact to soft touch on all 4 extremities. No evidence of extinction is noted.  Coordination: Cerebellar testing reveals good finger-nose-finger and heel-to-shin bilaterally.  Gait and station: Gait is slightly wide-based, but steady, slight limp on the left, can walk independently. Reflexes: Deep tendon reflexes are symmetric and normal bilaterally.   DIAGNOSTIC DATA (LABS, IMAGING, TESTING) - I  reviewed patient records, labs, notes, testing and imaging myself where available.  Lab Results  Component Value Date   WBC 5.9 04/17/2019   HGB 17.2 (H) 04/17/2019   HCT 50.2 04/17/2019   MCV 90.0 04/17/2019   PLT 200 04/17/2019      Component Value Date/Time   NA 139 04/17/2019 0810   NA 142 08/23/2017 1059   NA 142 11/06/2014 1751   K 3.7 04/17/2019 0810   K 3.7 11/06/2014 1751   CL 109 04/17/2019 0810   CL 106 11/06/2014 1751   CO2 20 (L) 04/17/2019 0810   CO2 29 11/06/2014 1751   GLUCOSE 83 04/17/2019 0810   GLUCOSE 91 11/06/2014 1751   BUN 12 04/17/2019 0810   BUN 17 08/23/2017 1059   BUN 14 11/06/2014 1751   CREATININE 1.31 (H) 04/17/2019 0810   CREATININE 1.11 11/06/2014 1751   CALCIUM 9.1 04/17/2019 0810   CALCIUM 8.8 11/06/2014 1751   PROT 6.6 04/17/2019 0810   PROT 6.5 08/23/2017 1059   PROT 6.9 11/06/2014 1751   ALBUMIN 4.0 04/17/2019 0810   ALBUMIN 4.5 08/23/2017 1059   ALBUMIN 3.8 11/06/2014 1751   AST 20 04/17/2019 0810   AST 33 11/06/2014 1751   ALT 29 04/17/2019 0810   ALT 55 11/06/2014 1751   ALKPHOS 81 04/17/2019 0810   ALKPHOS 89 11/06/2014 1751   BILITOT 0.9 04/17/2019 0810   BILITOT <0.2 08/23/2017 1059   BILITOT 0.4 11/06/2014 1751   GFRNONAA >60 04/17/2019 0810   GFRNONAA >60 11/06/2014 1751   GFRNONAA >60 03/12/2014 1140   GFRAA >60 04/17/2019 0810   GFRAA >60 11/06/2014 1751   GFRAA  >60 03/12/2014 1140   Lab Results  Component Value Date   CHOL 119 04/17/2019   HDL 34 (L) 04/17/2019   LDLCALC 64 04/17/2019   TRIG 104 04/17/2019   CHOLHDL 3.5 04/17/2019   Lab Results  Component Value Date   HGBA1C 5.3 04/17/2019   Lab Results  Component Value Date   VITAMINB12 238 04/17/2019   Lab Results  Component Value Date   TSH 4.294 04/17/2019   ASSESSMENT AND PLAN 49 y.o. 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 here with:  1.  Right cerebellar infarct 2.  Gait disturbance 3.  History of seizures  -Remains overall stable, has chronic speech deficit and gait instability  -Continue Plavix following CVA  -Continue Topamax 50 mg am/100 mg pm  -Continue follow-up with PCP, cardiology  -Follow-up here in 8 months or sooner if needed  I spent 20 minutes of face-to-face and non-face-to-face time with patient.  This included previsit chart review, lab review, study review, order entry, electronic health record documentation, patient education.  Butler Denmark, AGNP-C, DNP 06/10/2020, 11:44 AM [facility] [address] [place], [place] [address] [phone]

## 2020-06-11 NOTE — Progress Notes (Signed)
I have read the note, and I agree with the clinical assessment and plan.  Orel Cooler K Tabbatha Bordelon   

## 2020-06-12 DIAGNOSIS — H903 Sensorineural hearing loss, bilateral: Secondary | ICD-10-CM | POA: Diagnosis not present

## 2020-06-12 DIAGNOSIS — Z45321 Encounter for adjustment and management of cochlear device: Secondary | ICD-10-CM | POA: Diagnosis not present

## 2020-06-16 ENCOUNTER — Ambulatory Visit: Payer: Medicare Other | Admitting: Urology

## 2020-06-18 DIAGNOSIS — I493 Ventricular premature depolarization: Secondary | ICD-10-CM | POA: Diagnosis not present

## 2020-07-01 ENCOUNTER — Telehealth: Payer: Self-pay | Admitting: Cardiology

## 2020-07-01 DIAGNOSIS — I472 Ventricular tachycardia, unspecified: Secondary | ICD-10-CM

## 2020-07-01 NOTE — Telephone Encounter (Signed)
Patient is calling for his monitor results.  

## 2020-07-02 NOTE — Telephone Encounter (Signed)
Patient called again for his monitor results

## 2020-07-07 NOTE — Telephone Encounter (Signed)
Called patient and informed him of results and reccomendations. He will have echo done and we will call to let him know when scheduled.

## 2020-07-11 ENCOUNTER — Ambulatory Visit: Payer: Medicare Other | Admitting: Cardiology

## 2020-07-21 ENCOUNTER — Ambulatory Visit (HOSPITAL_BASED_OUTPATIENT_CLINIC_OR_DEPARTMENT_OTHER)
Admission: RE | Admit: 2020-07-21 | Discharge: 2020-07-21 | Disposition: A | Discharge status: Home / Self Care | Payer: Medicare Other | Source: Ambulatory Visit | Attending: Cardiology | Admitting: Cardiology

## 2020-07-21 ENCOUNTER — Other Ambulatory Visit: Payer: Self-pay

## 2020-07-21 DIAGNOSIS — I472 Ventricular tachycardia, unspecified: Secondary | ICD-10-CM

## 2020-07-21 DIAGNOSIS — F064 Anxiety disorder due to known physiological condition: Secondary | ICD-10-CM | POA: Diagnosis not present

## 2020-07-21 DIAGNOSIS — F482 Pseudobulbar affect: Secondary | ICD-10-CM | POA: Diagnosis not present

## 2020-07-21 LAB — ECHOCARDIOGRAM COMPLETE
Area-P 1/2: 4.63 cm2
S' Lateral: 2.87 cm

## 2020-07-23 ENCOUNTER — Telehealth: Payer: Self-pay

## 2020-07-24 NOTE — Telephone Encounter (Signed)
Results reviewed with pt as per Dr. Krasowski's note.  Pt verbalized understanding and had no additional questions. Routed to PCP  

## 2020-07-28 ENCOUNTER — Ambulatory Visit: Payer: Medicare Other | Admitting: Urology

## 2020-07-28 ENCOUNTER — Other Ambulatory Visit: Payer: Self-pay

## 2020-07-28 VITALS — BP 114/79 | HR 94 | Ht 68.0 in | Wt 176.3 lb

## 2020-07-28 DIAGNOSIS — R269 Unspecified abnormalities of gait and mobility: Secondary | ICD-10-CM | POA: Insufficient documentation

## 2020-07-28 DIAGNOSIS — R32 Unspecified urinary incontinence: Secondary | ICD-10-CM

## 2020-07-28 DIAGNOSIS — N3281 Overactive bladder: Secondary | ICD-10-CM

## 2020-07-28 DIAGNOSIS — F32A Depression, unspecified: Secondary | ICD-10-CM | POA: Insufficient documentation

## 2020-07-28 DIAGNOSIS — E039 Hypothyroidism, unspecified: Secondary | ICD-10-CM | POA: Insufficient documentation

## 2020-07-28 DIAGNOSIS — H9193 Unspecified hearing loss, bilateral: Secondary | ICD-10-CM

## 2020-07-28 DIAGNOSIS — H905 Unspecified sensorineural hearing loss: Secondary | ICD-10-CM | POA: Insufficient documentation

## 2020-07-28 DIAGNOSIS — C719 Malignant neoplasm of brain, unspecified: Secondary | ICD-10-CM | POA: Insufficient documentation

## 2020-07-28 DIAGNOSIS — F419 Anxiety disorder, unspecified: Secondary | ICD-10-CM | POA: Insufficient documentation

## 2020-07-28 DIAGNOSIS — E538 Deficiency of other specified B group vitamins: Secondary | ICD-10-CM | POA: Insufficient documentation

## 2020-07-28 DIAGNOSIS — G4700 Insomnia, unspecified: Secondary | ICD-10-CM | POA: Insufficient documentation

## 2020-07-28 DIAGNOSIS — R29898 Other symptoms and signs involving the musculoskeletal system: Secondary | ICD-10-CM | POA: Insufficient documentation

## 2020-07-28 HISTORY — DX: Unspecified hearing loss, bilateral: H91.93

## 2020-07-28 LAB — BLADDER SCAN AMB NON-IMAGING

## 2020-07-28 NOTE — Patient Instructions (Signed)

## 2020-07-28 NOTE — Progress Notes (Signed)
07/28/20 12:59 PM   [name] Love 29-Mar-1971 [id number]  CC: Urinary frequency and urge incontinence  HPI: [name] Love is a 49 year old male with a complex history including history of multiple brain surgeries, brain radiation, stroke, VP shunt, and seizures who presents with 3 to 6 months of worsening urinary symptoms including urinary frequency and occasional urge incontinence, as well as nocturia at least 4-5 times per night.  He denies any gross hematuria or dysuria or flank pain.  He also notes some worsening of his vision, especially at night, as well as worsening balance over a similar timeframe.  He denies any trouble with constipation.   Urinalysis with PCP in September was normal, PSA was normal at 1.1, and hemoglobin A1c was normal at 5.2.  PVR is normal today at 10 mL.  His most recent head imaging was July 2020.  PMH: Past Medical History:  Diagnosis Date  . Abnormal gait   . Acute arterial ischemic stroke, vertebrobasilar, thalamic ([place]) 06/08/2018  . Allergic rhinitis   . Anxiety   . Arthritis 11/05/2013  . Atypical chest pain 11/30/2017  . Bilateral arm weakness   . Brain cancer ([place])   . Cerebral thrombosis with cerebral infarction 04/17/2019  . Cochlear implant in place with multiple channels 05/15/2020  . CVA (cerebrovascular accident) ([place]) 04/18/2019  . Depression   . Diplopia   . Dyspnea on exertion 11/30/2017  . 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 Episode of change in speech 04/17/2019  . Generalized anxiety disorder 11/28/2018  . Headache 11/05/2013  . Hearing loss, sensorineural   . Hemiparesis and alteration of sensations as late effects of stroke ([place]) 09/04/2018  . History of stroke 04/01/2020  . Hypothyroidism   . Insomnia   . Left leg weakness   . Low vitamin B12 level   . Memory loss 11/05/2013  . Myalgia 04/18/2017  . OSA on CPAP 11/28/2018  . Recurrent falls   . Refusal of blood transfusions as patient is Jehovah's Witness 04/01/2020  . Seizure ([place])   . Seizures  ([place])   . Sleeping difficulty 11/05/2013  . Stroke-like symptoms   . Ventricular ectopy 06/04/2020  . Weakness 04/16/2019  . Weakness generalized 04/17/2019    Surgical History: Past Surgical History:  Procedure Laterality Date  . BRAIN SURGERY    . HERNIA REPAIR    . SHUNT REVISION      Family History: Family History  Problem Relation Age of Onset  . Brain cancer Mother   . High blood pressure Mother     Social History:  reports that he has never smoked. He has never used smokeless tobacco. He reports that he does not drink alcohol and does not use drugs.  Physical Exam: BP 114/79 (BP Location: Left Arm, Patient Position: Sitting, Cuff Size: Large)   Pulse 94   Ht 5\' 8"  (1.727 m)   Wt 176 lb 4.8 oz (80 kg)   BMI 26.81 kg/m    Constitutional:  Alert and oriented, No acute distress. Cardiovascular: No clubbing, cyanosis, or edema. Respiratory: Normal respiratory effort, no increased work of breathing. GI: Abdomen is soft, nontender, nondistended, no abdominal masses GU: Phallus with patent meatus, no lesions  Laboratory Data: Reviewed, see HPI   Assessment & Plan:   In summary, he is a 49 year old complex male with an extensive history of multiple brain surgeries, VP shunt, stroke, and seizures who presents with 3 to 6 months of new urinary symptoms of urgency, frequency, occasional urge incontinence, nocturia,  as well as worsening balance issues and vision changes.  We had a long conversation today about the complex physiology behind voiding, and the need for further imaging with his complex neurologic history.  We discussed numerous neurologic issues that can cause overactive bladder symptoms including MS and brain/spinal cord lesions.  We also discussed behavioral strategies like minimizing soda, tea, coffee, diet drinks in the diet, timed voiding, and minimizing fluids before bed.  With his complex neurologic history and recent cochlear implant, will defer exact imaging  to his neurologist, but likely MRI brain/spine if compatible.  He will contact his neurologist to order imaging.  I also recommended a trial of Myrbetriq 50 mg daily for his urinary symptoms, and 1 month of samples were provided today  Follow-up in 4 to 6 weeks for symptom check, and imaging follow-up Consider urodynamics in the future if imaging negative  Nickolas Madrid, MD 07/28/2020  [place] [address] [place], [place] [address] [phone]

## 2020-07-29 LAB — MICROSCOPIC EXAMINATION
Bacteria, UA: NONE SEEN
RBC, Urine: NONE SEEN /hpf (ref 0–2)

## 2020-07-29 LAB — URINALYSIS, COMPLETE
Bilirubin, UA: NEGATIVE
Glucose, UA: NEGATIVE
Ketones, UA: NEGATIVE
Leukocytes,UA: NEGATIVE
Nitrite, UA: NEGATIVE
Protein,UA: NEGATIVE
RBC, UA: NEGATIVE
Specific Gravity, UA: 1.025 (ref 1.005–1.030)
Urobilinogen, Ur: 0.2 mg/dL (ref 0.2–1.0)
pH, UA: 5.5 (ref 5.0–7.5)

## 2020-08-01 ENCOUNTER — Other Ambulatory Visit: Payer: Self-pay

## 2020-08-01 ENCOUNTER — Encounter: Payer: Self-pay | Admitting: Cardiology

## 2020-08-01 ENCOUNTER — Ambulatory Visit: Payer: Medicare Other | Admitting: Cardiology

## 2020-08-01 VITALS — BP 112/70 | HR 98 | Ht 68.0 in | Wt 179.1 lb

## 2020-08-01 DIAGNOSIS — Z8673 Personal history of transient ischemic attack (TIA), and cerebral infarction without residual deficits: Secondary | ICD-10-CM

## 2020-08-01 DIAGNOSIS — C719 Malignant neoplasm of brain, unspecified: Secondary | ICD-10-CM

## 2020-08-01 DIAGNOSIS — I493 Ventricular premature depolarization: Secondary | ICD-10-CM

## 2020-08-01 MED ORDER — METOPROLOL SUCCINATE ER 50 MG PO TB24: 50.0000 mg | ORAL_TABLET | Freq: Every day | ORAL | 3 refills | Status: DC

## 2020-08-01 NOTE — Patient Instructions (Signed)
Medication Instructions:  Your physician has recommended you make the following change in your medication:  START: Metoprolol Succinate 50 mg take one tablet by mouth daily *If you need a refill on your cardiac medications before your next appointment, please call your pharmacy*   Lab Work: None If you have labs (blood work) drawn today and your tests are completely normal, you will receive your results only by: Marland Kitchen MyChart Message (if you have MyChart) OR . A paper copy in the mail If you have any lab test that is abnormal or we need to change your treatment, we will call you to review the results.   Testing/Procedures: None   Follow-Up: At [facility], you and your health needs are our priority.  As part of our continuing mission to provide you with exceptional heart care, we have created designated Provider Care Teams.  These Care Teams include your primary Cardiologist (physician) and Advanced Practice Providers (APPs -  Physician Assistants and Nurse Practitioners) who all work together to provide you with the care you need, when you need it.  We recommend signing up for the patient portal called "MyChart".  Sign up information is provided on this After Visit Summary.  MyChart is used to connect with patients for Virtual Visits (Telemedicine).  Patients are able to view lab/test results, encounter notes, upcoming appointments, etc.  Non-urgent messages can be sent to your provider as well.   To learn more about what you can do with MyChart, go to [email or web address].    Your next appointment:   5 month(s)  The format for your next appointment:   In Person  Provider:   Jenne Campus, MD   Other Instructions

## 2020-08-01 NOTE — Progress Notes (Signed)
Cardiology Office Note:    Date:  08/01/2020   ID:  [name] Love, DOB 1971-02-16, MRN [id number]  PCP:  Leonides Sake, MD  Cardiologist:  Jenne Campus, MD    Referring MD: Leonides Sake, MD   No chief complaint on file. I am doing fine  History of Present Illness:    [name] Love is a 49 y.o. male with history of brain cancer, CVA, he was found to have frequent ventricular ectopy.  Monitor was placed on which showed 13.3% burden of ventricular ectopy on top of that he had 1 run of wide-complex tachycardia only 8 beats but very fast rate of 255.  He is asymptomatic.  Denies have any chest pain tightness squeezing pressure burning chest.  Conversation with him is somewhat difficult because he is hard of hearing.  Past Medical History:  Diagnosis Date  . Abnormal gait   . Acute arterial ischemic stroke, vertebrobasilar, thalamic ([place]) 06/08/2018  . Allergic rhinitis   . Anxiety   . Arthritis 11/05/2013  . Atypical chest pain 11/30/2017  . Bilateral arm weakness   . Brain cancer ([place])   . Cerebral thrombosis with cerebral infarction 04/17/2019  . Cochlear implant in place with multiple channels 05/15/2020  . CVA (cerebrovascular accident) ([place]) 04/18/2019  . Depression   . Diplopia   . Dyspnea on exertion 11/30/2017  . 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 Episode of change in speech 04/17/2019  . Generalized anxiety disorder 11/28/2018  . Headache 11/05/2013  . Hearing loss, sensorineural   . Hemiparesis and alteration of sensations as late effects of stroke ([place]) 09/04/2018  . History of stroke 04/01/2020  . Hypothyroidism   . Insomnia   . Left leg weakness   . Low vitamin B12 level   . Memory loss 11/05/2013  . Myalgia 04/18/2017  . OSA on CPAP 11/28/2018  . Recurrent falls   . Refusal of blood transfusions as patient is Jehovah's Witness 04/01/2020  . Seizure ([place])   . Seizures ([place])   . Sleeping difficulty 11/05/2013  . Stroke-like symptoms   . Ventricular ectopy 06/04/2020  . Weakness  04/16/2019  . Weakness generalized 04/17/2019    Past Surgical History:  Procedure Laterality Date  . BRAIN SURGERY    . HERNIA REPAIR    . SHUNT REVISION      Current Medications: Current Meds  Medication Sig  . acetaminophen (TYLENOL) 500 MG tablet Take 1,000 mg by mouth 2 (two) times daily.  Marland Kitchen ALPRAZolam (XANAX) 0.5 MG tablet Take 0.5 mg by mouth 2 (two) times daily.  . clopidogrel (PLAVIX) 75 MG tablet TAKE 1 TABLET(75 MG) BY MOUTH DAILY  . topiramate (TOPAMAX) 50 MG tablet TAKE 1 TABLET BY MOUTH IN THE MORNING AND 2 TABLETS  IN THE EVENING     Allergies:   Baclofen, Seroquel [quetiapine], Sulfa antibiotics, Coconut flavor, and Contrast media [iodinated diagnostic agents]   Social History   Socioeconomic History  . Marital status: Single    Spouse name: Not on file  . Number of children: 0  . Years of education: 72  . Highest education level: Not on file  Occupational History  . Occupation: Disabled  Tobacco Use  . Smoking status: Never Smoker  . Smokeless tobacco: Never Used  Vaping Use  . Vaping Use: Never used  Substance and Sexual Activity  . Alcohol use: No  . Drug use: No  . Sexual activity: Yes    Birth control/protection: None  Other Topics Concern  .  Not on file  Social History Narrative   Lives with Aunt [name])   Caffeine use: No coffee   Soda- trying to quit      Right-handed   Social Determinants of Health   Financial Resource Strain:   . Difficulty of Paying Living Expenses: Not on file  Food Insecurity:   . Worried About Charity fundraiser in the Last Year: Not on file  . Ran Out of Food in the Last Year: Not on file  Transportation Needs:   . Lack of Transportation (Medical): Not on file  . Lack of Transportation (Non-Medical): Not on file  Physical Activity:   . Days of Exercise per Week: Not on file  . Minutes of Exercise per Session: Not on file  Stress:   . Feeling of Stress : Not on file  Social Connections:   .  Frequency of Communication with Friends and Family: Not on file  . Frequency of Social Gatherings with Friends and Family: Not on file  . Attends Religious Services: Not on file  . Active Member of Clubs or Organizations: Not on file  . Attends Archivist Meetings: Not on file  . Marital Status: Not on file     Family History: The patient's family history includes Brain cancer in his mother; High blood pressure in his mother. ROS:   Please see the history of present illness.    All 14 point review of systems negative except as described per history of present illness  EKGs/Labs/Other Studies Reviewed:      Recent Labs: No results found for requested labs within last 8760 hours.  Recent Lipid Panel    Component Value Date/Time   CHOL 119 04/17/2019 0810   TRIG 104 04/17/2019 0810   HDL 34 (L) 04/17/2019 0810   CHOLHDL 3.5 04/17/2019 0810   VLDL 21 04/17/2019 0810   LDLCALC 64 04/17/2019 0810    Physical Exam:    VS:  BP 112/70   Pulse 98   Ht 5\' 8"  (1.727 m)   Wt 179 lb 1.9 oz (81.2 kg)   SpO2 98%   BMI 27.24 kg/m     Wt Readings from Last 3 Encounters:  08/01/20 179 lb 1.9 oz (81.2 kg)  07/28/20 176 lb 4.8 oz (80 kg)  06/10/20 175 lb (79.4 kg)     GEN:  Well nourished, well developed in no acute distress HEENT: Normal NECK: No JVD; No carotid bruits LYMPHATICS: No lymphadenopathy CARDIAC: RRR, no murmurs, no rubs, no gallops RESPIRATORY:  Clear to auscultation without rales, wheezing or rhonchi  ABDOMEN: Soft, non-tender, non-distended MUSCULOSKELETAL:  No edema; No deformity  SKIN: Warm and dry LOWER EXTREMITIES: no swelling NEUROLOGIC:  Alert and oriented x 3 PSYCHIATRIC:  Normal affect   ASSESSMENT:    1. Ventricular ectopy   2. Malignant neoplasm of brain, unspecified location ([place])   3. 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 History of stroke    PLAN:    In order of problems listed above:  1. Frequent ventricular ectopy which is a new  problem with runs of nonsustained ventricular tachycardia.  He did have echocardiogram done which showed preserved left ventricle ejection fraction.  We did talk about options for the situation he is very concerned about his health and he would like to be treated.  I think the best approach will be to start just with the beta-blocker.  I will give him prescription for metoprolol succinate 50 mg daily. 2.  History of brain cancer status post therapy follow-up oncology and medicine team. 3. Hearing loss, conversation somewhat difficult but I think we were able to manage. 4. History of stroke.  Noted.   Medication Adjustments/Labs and Tests Ordered: Current medicines are reviewed at length with the patient today.  Concerns regarding medicines are outlined above.  No orders of the defined types were placed in this encounter.  Medication changes: No orders of the defined types were placed in this encounter.   Signed, Park Liter, MD, [facility] 08/01/2020 10:38 AM    [place]

## 2020-08-22 ENCOUNTER — Other Ambulatory Visit: Payer: Self-pay | Admitting: Cardiology

## 2020-09-01 DIAGNOSIS — N3281 Overactive bladder: Secondary | ICD-10-CM | POA: Diagnosis not present

## 2020-09-01 DIAGNOSIS — R300 Dysuria: Secondary | ICD-10-CM | POA: Diagnosis not present

## 2020-09-01 DIAGNOSIS — Z6826 Body mass index (BMI) 26.0-26.9, adult: Secondary | ICD-10-CM | POA: Diagnosis not present

## 2020-09-03 ENCOUNTER — Ambulatory Visit: Payer: Self-pay | Admitting: Urology

## 2020-09-29 ENCOUNTER — Other Ambulatory Visit: Payer: Self-pay | Admitting: Neurology

## 2020-10-27 DIAGNOSIS — F419 Anxiety disorder, unspecified: Secondary | ICD-10-CM | POA: Diagnosis not present

## 2020-11-10 DIAGNOSIS — R42 Dizziness and giddiness: Secondary | ICD-10-CM | POA: Diagnosis not present

## 2020-11-10 DIAGNOSIS — R269 Unspecified abnormalities of gait and mobility: Secondary | ICD-10-CM | POA: Diagnosis not present

## 2020-11-10 DIAGNOSIS — Z6826 Body mass index (BMI) 26.0-26.9, adult: Secondary | ICD-10-CM | POA: Diagnosis not present

## 2020-11-10 DIAGNOSIS — H538 Other visual disturbances: Secondary | ICD-10-CM | POA: Diagnosis not present

## 2021-01-30 ENCOUNTER — Ambulatory Visit: Payer: Medicare Other | Admitting: Cardiology

## 2021-02-02 DIAGNOSIS — F419 Anxiety disorder, unspecified: Secondary | ICD-10-CM | POA: Diagnosis not present

## 2021-02-02 DIAGNOSIS — F429 Obsessive-compulsive disorder, unspecified: Secondary | ICD-10-CM | POA: Diagnosis not present

## 2021-02-10 ENCOUNTER — Ambulatory Visit: Payer: Medicare Other | Admitting: Neurology

## 2021-02-10 ENCOUNTER — Other Ambulatory Visit: Payer: Self-pay

## 2021-02-10 ENCOUNTER — Encounter: Payer: Self-pay | Admitting: Neurology

## 2021-02-10 VITALS — BP 97/65 | HR 71 | Ht 68.0 in | Wt 173.0 lb

## 2021-02-10 DIAGNOSIS — R269 Unspecified abnormalities of gait and mobility: Secondary | ICD-10-CM

## 2021-02-10 DIAGNOSIS — Z8673 Personal history of transient ischemic attack (TIA), and cerebral infarction without residual deficits: Secondary | ICD-10-CM

## 2021-02-10 DIAGNOSIS — R569 Unspecified convulsions: Secondary | ICD-10-CM

## 2021-02-10 MED ORDER — TOPIRAMATE 50 MG PO TABS: ORAL_TABLET | ORAL | 3 refills | Status: DC

## 2021-02-10 NOTE — Patient Instructions (Signed)
Continue Topamax for seizure prevention  Be careful with walking, not to fall  See you back in 1 year

## 2021-02-10 NOTE — Progress Notes (Signed)
PATIENT: [name] Love DOB: 10-03-1971  REASON FOR VISIT: follow up HISTORY FROM: patient  HISTORY OF PRESENT ILLNESS: Today 02/10/21 [name] Love is a 50 year old male with history of ependymoma, status postradiation therapy and resection.  Small subacute right cerebellar infarct in 2020, is on Plavix.  Takes Topamax for seizures.  Chronic issues with balance and gait instability. Has seen cardiology, heart monitor showed frequent ventricular ectopy with runs of nonsustained ventricular tachycardia, is on beta blocker. Has left cochlear implant, planning to have right implant soon. Chronic weakness to left leg, speech alteration.  His aunt passed away, he is now living alone in an apartment.  He drives a car.  Has poor vision, sees ophthalmology, has prisms. Claims soon won't be able to drive at night.  Has a service dog.  Here today for evaluation unaccompanied.  Update 06/10/2020 SS: [name] Love is a 50 year old male with a history of ependymoma, status post radiation therapy and resection.  In summer 2020, he had small subacute right cerebellar infarct.  He is on Plavix.  He is on Topamax for seizures.  Has chronic issues with balance and gait instability.  He had a left cochlear implant placed recently, has a service dog with him today.  Seeing cardiology, currently wearing a heart monitor.  No recurrent seizure.  He is living with his aunt, but indicates he will be living alone soon for a better home environment.  He is able to drive, but not at night.  Has chronic weakness to the left leg, speech alteration.  He is being evaluated for possible prostate cancer.  He presents today for evaluation.  HISTORY  10/08/2019 SS: [name] Love is a 50 year old male with history of ependymoma, status post radiation therapy and resection.  In the summer 2020, he suffered a small subacute right cerebellar infarct.  He remains on Plavix.  He also has history of seizures, he remains on Topamax.  He has not had recurrent  seizure.  He continues to complain of chronic issues with his balance and gait instability.  He reports this is worse at night, he has had a few falls, but has not been injured.  He drives a car without difficulty.  He does not require assistive device for ambulation.  He lives with his aunt.  He continues to report some speech alteration since the stroke.  He is no longer in therapy.  He thinks his speech would have improved more if he would have been able to complete inpatient rehab therapy.  He reports he has some left leg weakness as result of the stroke.  He is hard of hearing.  He presents today for evaluation unaccompanied.  REVIEW OF SYSTEMS: Out of a complete 14 system review of symptoms, the patient complains only of the following symptoms, and all other reviewed systems are negative.  Walking difficulty  ALLERGIES: Allergies  Allergen Reactions  . Baclofen     Urinary retension  . Seroquel [Quetiapine]     Weight gain  . Sulfa Antibiotics   . Coconut Flavor Rash    ANYTHING COCONUT  . Contrast Media [Iodinated Diagnostic Agents] Rash    HOME MEDICATIONS: Outpatient Medications Prior to Visit  Medication Sig Dispense Refill  . ALPRAZolam (XANAX) 0.5 MG tablet Take 0.5 mg by mouth 2 (two) times daily.  2  . clopidogrel (PLAVIX) 75 MG tablet TAKE 1 TABLET(75 MG) BY MOUTH DAILY 90 tablet 3  . fluvoxaMINE (LUVOX) 100 MG tablet Take 1 tablet by mouth at  bedtime.    Marland Kitchen acetaminophen (TYLENOL) 500 MG tablet Take 1,000 mg by mouth 2 (two) times daily.    Marland Kitchen topiramate (TOPAMAX) 50 MG tablet TAKE 1 TABLET BY MOUTH EVERY MORNING AND 2 TABLETS BY MOUTH EVERY EVENING 270 tablet 3  . metoprolol succinate (TOPROL-XL) 50 MG 24 hr tablet Take 1 tablet (50 mg total) by mouth daily. Take with or immediately following a meal. 90 tablet 3   No facility-administered medications prior to visit.    PAST MEDICAL HISTORY: Past Medical History:  Diagnosis Date  . Abnormal gait   . Acute arterial  ischemic stroke, vertebrobasilar, thalamic ([place]) 06/08/2018  . Allergic rhinitis   . Anxiety   . Arthritis 11/05/2013  . Atypical chest pain 11/30/2017  . Bilateral arm weakness   . Brain cancer ([place])   . Cerebral thrombosis with cerebral infarction 04/17/2019  . Cochlear implant in place with multiple channels 05/15/2020  . CVA (cerebrovascular accident) ([place]) 04/18/2019  . Depression   . Diplopia   . Dyspnea on exertion 11/30/2017  . 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 Episode of change in speech 04/17/2019  . Generalized anxiety disorder 11/28/2018  . Headache 11/05/2013  . Hearing loss, sensorineural   . Hemiparesis and alteration of sensations as late effects of stroke ([place]) 09/04/2018  . History of stroke 04/01/2020  . Hypothyroidism   . Insomnia   . Left leg weakness   . Low vitamin B12 level   . Memory loss 11/05/2013  . Myalgia 04/18/2017  . OSA on CPAP 11/28/2018  . Recurrent falls   . Refusal of blood transfusions as patient is Jehovah's Witness 04/01/2020  . Seizure ([place])   . Seizures ([place])   . Sleeping difficulty 11/05/2013  . Stroke-like symptoms   . Ventricular ectopy 06/04/2020  . Weakness 04/16/2019  . Weakness generalized 04/17/2019    PAST SURGICAL HISTORY: Past Surgical History:  Procedure Laterality Date  . BRAIN SURGERY    . HERNIA REPAIR    . SHUNT REVISION      FAMILY HISTORY: Family History  Problem Relation Age of Onset  . Brain cancer Mother   . High blood pressure Mother     SOCIAL HISTORY: Social History   Socioeconomic History  . Marital status: Single    Spouse name: Not on file  . Number of children: 0  . Years of education: 47  . Highest education level: Not on file  Occupational History  . Occupation: Disabled  Tobacco Use  . Smoking status: Never Smoker  . Smokeless tobacco: Never Used  Vaping Use  . Vaping Use: Never used  Substance and Sexual Activity  . Alcohol use: No  . Drug use: No  . Sexual activity: Yes    Birth control/protection:  None  Other Topics Concern  . Not on file  Social History Narrative   Lives with Aunt [name])   Caffeine use: No coffee   Soda- trying to quit      Right-handed   Social Determinants of Health   Financial Resource Strain: Not on file  Food Insecurity: Not on file  Transportation Needs: Not on file  Physical Activity: Not on file  Stress: Not on file  Social Connections: Not on file  Intimate Partner Violence: Not on file   PHYSICAL EXAM  Vitals:   02/10/21 1107  BP: 97/65  Pulse: 71  Weight: 173 lb (78.5 kg)  Height: 5\' 8"  (1.727 m)   Body mass index is 26.3  kg/m.  Generalized: Well developed, in no acute distress   Neurological examination  Mentation: Alert oriented to time, place, history taking. Follows all commands speech and language fluent (speech is like an accent), hard of hearing Cranial nerve II-XII: Pupils were equal round reactive to light. Extraocular movements were full, visual field were full on confrontational test. Facial sensation and strength were normal.  Head turning and shoulder shrug were normal and symmetric. Motor: The motor testing reveals 5 over 5 strength of all 4 extremities. Good symmetric motor tone is noted throughout.  Sensory: Sensory testing is intact to soft touch on all 4 extremities. No evidence of extinction is noted.  Coordination: Cerebellar testing reveals good finger-nose-finger and heel-to-shin bilaterally.  Gait and station: Gait is slightly wide-based, but steady, slight limp on the left, can walk independently. Reflexes: Deep tendon reflexes are symmetric and normal bilaterally.   DIAGNOSTIC DATA (LABS, IMAGING, TESTING) - I reviewed patient records, labs, notes, testing and imaging myself where available.  Lab Results  Component Value Date   WBC 5.9 04/17/2019   HGB 17.2 (H) 04/17/2019   HCT 50.2 04/17/2019   MCV 90.0 04/17/2019   PLT 200 04/17/2019      Component Value Date/Time   NA 139 04/17/2019 0810    NA 142 08/23/2017 1059   NA 142 11/06/2014 1751   K 3.7 04/17/2019 0810   K 3.7 11/06/2014 1751   CL 109 04/17/2019 0810   CL 106 11/06/2014 1751   CO2 20 (L) 04/17/2019 0810   CO2 29 11/06/2014 1751   GLUCOSE 83 04/17/2019 0810   GLUCOSE 91 11/06/2014 1751   BUN 12 04/17/2019 0810   BUN 17 08/23/2017 1059   BUN 14 11/06/2014 1751   CREATININE 1.31 (H) 04/17/2019 0810   CREATININE 1.11 11/06/2014 1751   CALCIUM 9.1 04/17/2019 0810   CALCIUM 8.8 11/06/2014 1751   PROT 6.6 04/17/2019 0810   PROT 6.5 08/23/2017 1059   PROT 6.9 11/06/2014 1751   ALBUMIN 4.0 04/17/2019 0810   ALBUMIN 4.5 08/23/2017 1059   ALBUMIN 3.8 11/06/2014 1751   AST 20 04/17/2019 0810   AST 33 11/06/2014 1751   ALT 29 04/17/2019 0810   ALT 55 11/06/2014 1751   ALKPHOS 81 04/17/2019 0810   ALKPHOS 89 11/06/2014 1751   BILITOT 0.9 04/17/2019 0810   BILITOT <0.2 08/23/2017 1059   BILITOT 0.4 11/06/2014 1751   GFRNONAA >60 04/17/2019 0810   GFRNONAA >60 11/06/2014 1751   GFRNONAA >60 03/12/2014 1140   GFRAA >60 04/17/2019 0810   GFRAA >60 11/06/2014 1751   GFRAA >60 03/12/2014 1140   Lab Results  Component Value Date   CHOL 119 04/17/2019   HDL 34 (L) 04/17/2019   LDLCALC 64 04/17/2019   TRIG 104 04/17/2019   CHOLHDL 3.5 04/17/2019   Lab Results  Component Value Date   HGBA1C 5.3 04/17/2019   Lab Results  Component Value Date   VITAMINB12 238 04/17/2019   Lab Results  Component Value Date   TSH 4.294 04/17/2019   ASSESSMENT AND PLAN 50 y.o. 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[place]), Episode of change in speech (04/17/2019), Generalized  anxiety disorder (11/28/2018), Headache (11/05/2013), Hearing loss, sensorineural, Hemiparesis and alteration of sensations as late effects of stroke ([place]) (09/04/2018), History of stroke (04/01/2020), Hypothyroidism, Insomnia, Left leg weakness, Low vitamin B12 level, Memory loss (11/05/2013), Myalgia (04/18/2017), OSA on CPAP (11/28/2018), Recurrent falls, Refusal of blood transfusions as patient is Jehovah's Witness (04/01/2020), Seizure ([place]), Seizures ([place]), Sleeping difficulty (11/05/2013), Stroke-like symptoms, Ventricular ectopy (06/04/2020), Weakness (04/16/2019), and Weakness generalized (04/17/2019). here with:  1.  Right cerebellar infarct 2.  Gait disturbance 3.  History of seizures  -Appears to have remained overall neurologically stable, has chronic speech deficit and gait instability -No recurrent seizures, will continue Topamax 50/100 -Continue Plavix following CVA -Continue Topamax 50 mg am/100 mg pm -Seeing cardiology, on metoprolol, continue to see PCP -Follow-up here in 1 year or sooner if needed  Evangeline Dakin, DNP 02/10/2021, 11:32 AM [facility] [address], [place], [place] [address] [phone]

## 2021-02-10 NOTE — Progress Notes (Signed)
I have read the note, and I agree with the clinical assessment and plan.  Melora Menon K Rashel Okeefe   

## 2021-02-19 ENCOUNTER — Other Ambulatory Visit: Payer: Self-pay

## 2021-02-19 ENCOUNTER — Telehealth: Payer: Self-pay | Admitting: Cardiology

## 2021-02-19 MED ORDER — CLOPIDOGREL BISULFATE 75 MG PO TABS: ORAL_TABLET | ORAL | 3 refills | Status: DC

## 2021-02-19 NOTE — Telephone Encounter (Signed)
Refill sent in per request.  

## 2021-02-19 NOTE — Telephone Encounter (Signed)
*  STAT* If patient is at the pharmacy, call can be transferred to refill team.   1. Which medications need to be refilled? (please list name of each medication and dose if known) Plavix 75 mg,   2. Which pharmacy/location (including street and city if local pharmacy) is medication to be sent to? [facility] [place]  3. Do they need a 30 day or 90 day supply? [place]

## 2021-02-27 ENCOUNTER — Emergency Department (HOSPITAL_COMMUNITY): Payer: Medicare Other

## 2021-02-27 ENCOUNTER — Other Ambulatory Visit: Payer: Self-pay

## 2021-02-27 ENCOUNTER — Emergency Department (HOSPITAL_COMMUNITY)
Admission: EM | Admit: 2021-02-27 | Discharge: 2021-02-28 | Disposition: A | Discharge status: Home / Self Care | Payer: Medicare Other | Attending: Emergency Medicine | Admitting: Emergency Medicine

## 2021-02-27 DIAGNOSIS — Z7902 Long term (current) use of antithrombotics/antiplatelets: Secondary | ICD-10-CM | POA: Insufficient documentation

## 2021-02-27 DIAGNOSIS — Z982 Presence of cerebrospinal fluid drainage device: Secondary | ICD-10-CM | POA: Diagnosis not present

## 2021-02-27 DIAGNOSIS — R52 Pain, unspecified: Secondary | ICD-10-CM | POA: Diagnosis not present

## 2021-02-27 DIAGNOSIS — Z743 Need for continuous supervision: Secondary | ICD-10-CM | POA: Diagnosis not present

## 2021-02-27 DIAGNOSIS — E039 Hypothyroidism, unspecified: Secondary | ICD-10-CM | POA: Insufficient documentation

## 2021-02-27 DIAGNOSIS — Z85841 Personal history of malignant neoplasm of brain: Secondary | ICD-10-CM | POA: Diagnosis not present

## 2021-02-27 DIAGNOSIS — G40909 Epilepsy, unspecified, not intractable, without status epilepticus: Secondary | ICD-10-CM | POA: Diagnosis not present

## 2021-02-27 DIAGNOSIS — R0789 Other chest pain: Secondary | ICD-10-CM | POA: Diagnosis not present

## 2021-02-27 DIAGNOSIS — G319 Degenerative disease of nervous system, unspecified: Secondary | ICD-10-CM | POA: Diagnosis not present

## 2021-02-27 DIAGNOSIS — W1839XA Other fall on same level, initial encounter: Secondary | ICD-10-CM | POA: Insufficient documentation

## 2021-02-27 DIAGNOSIS — R569 Unspecified convulsions: Secondary | ICD-10-CM

## 2021-02-27 DIAGNOSIS — I639 Cerebral infarction, unspecified: Secondary | ICD-10-CM | POA: Diagnosis not present

## 2021-02-27 DIAGNOSIS — R404 Transient alteration of awareness: Secondary | ICD-10-CM | POA: Diagnosis not present

## 2021-02-27 DIAGNOSIS — Y92000 Kitchen of unspecified non-institutional (private) residence as  the place of occurrence of the external cause: Secondary | ICD-10-CM | POA: Diagnosis not present

## 2021-02-27 LAB — CBC WITH DIFFERENTIAL/PLATELET
Abs Immature Granulocytes: 0.03 10*3/uL (ref 0.00–0.07)
Basophils Absolute: 0.1 10*3/uL (ref 0.0–0.1)
Basophils Relative: 1 %
Eosinophils Absolute: 0.2 10*3/uL (ref 0.0–0.5)
Eosinophils Relative: 3 %
HCT: 44.4 % (ref 39.0–52.0)
Hemoglobin: 14.8 g/dL (ref 13.0–17.0)
Immature Granulocytes: 1 %
Lymphocytes Relative: 26 %
Lymphs Abs: 1.6 10*3/uL (ref 0.7–4.0)
MCH: 30.6 pg (ref 26.0–34.0)
MCHC: 33.3 g/dL (ref 30.0–36.0)
MCV: 91.9 fL (ref 80.0–100.0)
Monocytes Absolute: 0.5 10*3/uL (ref 0.1–1.0)
Monocytes Relative: 8 %
Neutro Abs: 3.8 10*3/uL (ref 1.7–7.7)
Neutrophils Relative %: 61 %
Platelets: 201 10*3/uL (ref 150–400)
RBC: 4.83 MIL/uL (ref 4.22–5.81)
RDW: 12.2 % (ref 11.5–15.5)
WBC: 6.1 10*3/uL (ref 4.0–10.5)
nRBC: 0 % (ref 0.0–0.2)

## 2021-02-27 LAB — URINALYSIS, ROUTINE W REFLEX MICROSCOPIC
Bilirubin Urine: NEGATIVE
Glucose, UA: NEGATIVE mg/dL
Hgb urine dipstick: NEGATIVE
Ketones, ur: NEGATIVE mg/dL
Leukocytes,Ua: NEGATIVE
Nitrite: NEGATIVE
Protein, ur: NEGATIVE mg/dL
Specific Gravity, Urine: 1.003 — ABNORMAL LOW (ref 1.005–1.030)
pH: 6 (ref 5.0–8.0)

## 2021-02-27 LAB — COMPREHENSIVE METABOLIC PANEL
ALT: 11 U/L (ref 0–44)
AST: 13 U/L — ABNORMAL LOW (ref 15–41)
Albumin: 3.5 g/dL (ref 3.5–5.0)
Alkaline Phosphatase: 71 U/L (ref 38–126)
Anion gap: 5 (ref 5–15)
BUN: 6 mg/dL (ref 6–20)
CO2: 23 mmol/L (ref 22–32)
Calcium: 8.3 mg/dL — ABNORMAL LOW (ref 8.9–10.3)
Chloride: 113 mmol/L — ABNORMAL HIGH (ref 98–111)
Creatinine, Ser: 1.29 mg/dL — ABNORMAL HIGH (ref 0.61–1.24)
GFR, Estimated: >60 mL/min (ref >60)
Glucose, Bld: 88 mg/dL (ref 70–99)
Potassium: 3 mmol/L — ABNORMAL LOW (ref 3.5–5.1)
Sodium: 141 mmol/L (ref 135–145)
Total Bilirubin: 0.7 mg/dL (ref 0.3–1.2)
Total Protein: 5.7 g/dL — ABNORMAL LOW (ref 6.5–8.1)

## 2021-02-27 MED ORDER — LEVETIRACETAM IN NACL 1000 MG/100ML IV SOLN: 1000.0000 mg | Freq: Once | INTRAVENOUS | Status: DC

## 2021-02-27 MED ORDER — LEVETIRACETAM IN NACL 1500 MG/100ML IV SOLN
1500.0000 mg | Freq: Once | INTRAVENOUS | Status: AC
  Administered 2021-02-27: 1500 mg via INTRAVENOUS
  Filled 2021-02-27: qty 100

## 2021-02-27 NOTE — ED Notes (Signed)
Patient transported to MRI 

## 2021-02-27 NOTE — ED Triage Notes (Signed)
Pt arrives via [facility] from home with complaints of seizures. History of brain cancer, stroke and seizure. EMS arrived and pt appeared post ictal . Alert and oriented X3. Unknown what baseline is per family d/t pt psych history.   20LAC BP 128/74 HR 66 99% RA

## 2021-02-27 NOTE — ED Notes (Signed)
Patient transported to CT 

## 2021-02-27 NOTE — ED Notes (Signed)
Patient transported to X-ray 

## 2021-02-27 NOTE — ED Notes (Signed)
POA April Gerilyn Nestle [phone] would like an update when possible

## 2021-02-27 NOTE — ED Provider Notes (Signed)
[place] EMERGENCY DEPARTMENT Provider Note   CSN: [id number] Arrival date & time: 02/27/21  1752     History No chief complaint on file.   [name] Love is a 50 y.o. male presents to the Emergency Department via EMS after presumed seizure.  Patient was at his grandmother's house when she heard him fall and found him on the kitchen floor.  She called EMS and they report that upon their arrival patient was awake but appeared postictal.  They report that grandmother was unable to provide much history.  Became more oriented throughout transport.  Patient reports his grandmother has dementia.  Patient reports its been more than a year since his last seizure.  He recently moved out of his aunts house and is living alone.  He reports he often does not remember if he is taking his seizure medication and therefore does not want to double up.  He says that in light of this he has likely missed a number of doses in the last week.  Patient reports he remembers being in his grandmother's kitchen and then waking up in the ambulance.  He has no recollection of the event.  Denies aura.  Patient with a complicated medical history.  He has a history of ependymoma status postradiation therapy and resection.  He had a subacute right cerebellar infarct in 2020 and now takes Plavix.  He takes Topamax daily for seizures and is followed by [facility] neurology.  He has long-term gait instability, balance issues, neck left leg weakness and some speech deficits at baseline..  Recently had a cochlear implant placed.  Does drive on his own   The history is provided by the patient and medical records. No language interpreter was used.       Past Medical History:  Diagnosis Date  . Abnormal gait   . Acute arterial ischemic stroke, vertebrobasilar, thalamic ([place]) 06/08/2018  . Allergic rhinitis   . Anxiety   . Arthritis 11/05/2013  . Atypical chest pain 11/30/2017  . Bilateral arm weakness   . Brain  cancer ([place])   . Cerebral thrombosis with cerebral infarction 04/17/2019  . Cochlear implant in place with multiple channels 05/15/2020  . CVA (cerebrovascular accident) ([place]) 04/18/2019  . Depression   . Diplopia   . Dyspnea on exertion 11/30/2017  . Ependymoma of brain ([place])   . Episode of change in speech 04/17/2019  . Generalized anxiety disorder 11/28/2018  . Headache 11/05/2013  . Hearing loss, sensorineural   . Hemiparesis and alteration of sensations as late effects of stroke ([place]) 09/04/2018  . History of stroke 04/01/2020  . Hypothyroidism   . Insomnia   . Left leg weakness   . Low vitamin B12 level   . Memory loss 11/05/2013  . Myalgia 04/18/2017  . OSA on CPAP 11/28/2018  . Recurrent falls   . Refusal of blood transfusions as patient is Jehovah's Witness 04/01/2020  . Seizure ([place])   . Seizures ([place])   . Sleeping difficulty 11/05/2013  . Stroke-like symptoms   . Ventricular ectopy 06/04/2020  . Weakness 04/16/2019  . Weakness generalized 04/17/2019    Patient Active Problem List   Diagnosis Date Noted  . Bilateral hearing loss 07/28/2020  . Low vitamin B12 level   . Left leg weakness   . Insomnia   . Hypothyroidism   . Hearing loss, sensorineural   . Ependymoma of brain ([place])   . Depression   . Brain cancer ([place])   . Bilateral  arm weakness   . Anxiety   . Abnormal gait   . Ventricular ectopy 06/04/2020  . Cochlear implant in place with multiple channels 05/15/2020  . History of stroke 04/01/2020  . Refusal of blood transfusions as patient is Jehovah's Witness 04/01/2020  . CVA (cerebrovascular accident) ([place]) 04/18/2019  . Stroke-like symptoms   . Episode of change in speech 04/17/2019  . Weakness generalized 04/17/2019  . Cerebral thrombosis with cerebral infarction 04/17/2019  . Weakness 04/16/2019  . Generalized anxiety disorder 11/28/2018  . OSA on CPAP 11/28/2018  . Hemiparesis and alteration of sensations as late effects of stroke ([place]) 09/04/2018  . Acute  arterial ischemic stroke, vertebrobasilar, thalamic ([place]) 06/08/2018  . Seizure ([place])   . Recurrent falls   . Diplopia   . Acute CVA (cerebrovascular accident) ([place]) 06/07/2018  . Allergic rhinitis 04/03/2018  . Atypical chest pain 11/30/2017  . Dyspnea on exertion 11/30/2017  . Myalgia 04/18/2017  . Seizures ([place]) 01/20/2017  . Arthritis 11/05/2013  . Headache 11/05/2013  . Memory loss 11/05/2013  . Sleeping difficulty 11/05/2013    Past Surgical History:  Procedure Laterality Date  . BRAIN SURGERY    . HERNIA REPAIR    . SHUNT REVISION         Family History  Problem Relation Age of Onset  . Brain cancer Mother   . High blood pressure Mother     Social History   Tobacco Use  . Smoking status: Never Smoker  . Smokeless tobacco: Never Used  Vaping Use  . Vaping Use: Never used  Substance Use Topics  . Alcohol use: No  . Drug use: No    Home Medications Prior to Admission medications   Medication Sig Start Date End Date Taking? Authorizing Provider  ALPRAZolam Duanne Moron) 0.5 MG tablet Take 0.5 mg by mouth 2 (two) times daily. 05/30/18  Yes [provider]  clopidogrel (PLAVIX) 75 MG tablet TAKE 1 TABLET(75 MG) BY MOUTH DAILY Patient taking differently: Take 75 mg by mouth daily. 02/19/21  Yes Park Liter, MD  fluvoxaMINE (LUVOX) 100 MG tablet Take 100 mg by mouth at bedtime. 02/03/21  Yes [provider]  metoprolol succinate (TOPROL-XL) 50 MG 24 hr tablet Take 1 tablet (50 mg total) by mouth daily. Take with or immediately following a meal. 08/01/20 10/30/20 Yes Park Liter, MD  topiramate (TOPAMAX) 50 MG tablet TAKE 1 TABLET BY MOUTH EVERY MORNING AND 2 TABLETS BY MOUTH EVERY EVENING Patient taking differently: Take 50-100 mg by mouth See admin instructions. Take 50 mg by mouth in the morning and 100 mg in the evening 02/10/21  Yes Suzzanne Cloud, NP    Allergies    Baclofen, Seroquel [quetiapine], Sulfa antibiotics, Coconut flavor,  and Contrast media [iodinated diagnostic agents]  Review of Systems   Review of Systems  Constitutional: Negative for appetite change, diaphoresis, fatigue, fever and unexpected weight change.  HENT: Negative for mouth sores.   Eyes: Negative for visual disturbance.  Respiratory: Negative for cough, chest tightness, shortness of breath and wheezing.   Cardiovascular: Negative for chest pain.  Gastrointestinal: Negative for abdominal pain, constipation, diarrhea, nausea and vomiting.  Endocrine: Negative for polydipsia, polyphagia and polyuria.  Genitourinary: Negative for dysuria, frequency, hematuria and urgency.  Musculoskeletal: Negative for back pain and neck stiffness.  Skin: Negative for rash.  Allergic/Immunologic: Negative for immunocompromised state.  Neurological: Positive for seizures. Negative for syncope, light-headedness and headaches.  Hematological: Does not bruise/bleed easily.  Psychiatric/Behavioral: Negative  for sleep disturbance. The patient is not nervous/anxious.     Physical Exam Updated Vital Signs BP 118/90 (BP Location: Right Arm)   Pulse 62   Temp 98.6 F (37 C) (Oral)   Resp 17   Ht 5\' 8"  (1.727 m)   Wt 78.5 kg   SpO2 100%   BMI 26.30 kg/m   Physical Exam Vitals and nursing note reviewed.  Constitutional:      General: He is not in acute distress.    Appearance: He is not diaphoretic.  HENT:     Head: Normocephalic.  Eyes:     General: No scleral icterus.    Conjunctiva/sclera: Conjunctivae normal.  Cardiovascular:     Rate and Rhythm: Normal rate and regular rhythm.     Pulses: Normal pulses.          Radial pulses are 2+ on the right side and 2+ on the left side.  Pulmonary:     Effort: No tachypnea, accessory muscle usage, prolonged expiration, respiratory distress or retractions.     Breath sounds: No stridor.     Comments: Equal chest rise. No increased work of breathing. Abdominal:     General: There is no distension.      Palpations: Abdomen is soft.     Tenderness: There is no abdominal tenderness. There is no guarding or rebound.  Musculoskeletal:     Cervical back: Normal range of motion.     Comments: Moves all extremities equally and without difficulty.  Skin:    General: Skin is warm and dry.     Capillary Refill: Capillary refill takes less than 2 seconds.  Neurological:     Mental Status: He is alert.     GCS: GCS eye subscore is 4. GCS verbal subscore is 5. GCS motor subscore is 6.     Comments: Mental Status:  Alert, oriented, thought content appropriate, able to give a coherent history. Speech is slow and slightly slurred (patient reports this is baseline). Able to follow 2 step commands without difficulty.  Cranial Nerves:  II:  Peripheral visual fields grossly normal, pupils equal, round, reactive to light III,IV, VI: ptosis not present, extra-ocular motions intact bilaterally  V,VII: smile symmetric, facial light touch sensation equal VIII: hearing grossly normal to voice - hard of hearing at baseline now with cochlear implant X: uvula elevates symmetrically  XI: bilateral shoulder shrug symmetric and strong XII: midline tongue extension without fassiculations Motor:  Normal tone. 5/5 in upper and lower extremities bilaterally including strong and equal grip strength and dorsiflexion/plantar flexion Sensory: light touch normal in all extremities.  Cerebellar: normal finger-to-nose with bilateral upper extremities Gait: wide based gait CV: distal pulses palpable throughout   Psychiatric:        Mood and Affect: Mood normal.     ED Results / Procedures / Treatments   Labs (all labs ordered are listed, but only abnormal results are displayed) Labs Reviewed  COMPREHENSIVE METABOLIC PANEL - Abnormal; Notable for the following components:      Result Value   Potassium 3.0 (*)    Chloride 113 (*)    Creatinine, Ser 1.29 (*)    Calcium 8.3 (*)    Total Protein 5.7 (*)    AST 13 (*)     All other components within normal limits  URINALYSIS, ROUTINE W REFLEX MICROSCOPIC - Abnormal; Notable for the following components:   Color, Urine STRAW (*)    Specific Gravity, Urine 1.003 (*)    All  other components within normal limits  CBC WITH DIFFERENTIAL/PLATELET  TOPIRAMATE LEVEL    EKG EKG Interpretation  Date/Time:  Friday Feb 27 2021 18:10:10 EDT Ventricular Rate:  64 PR Interval:  156 QRS Duration: 80 QT Interval:  402 QTC Calculation: 414 R Axis:   72 Text Interpretation: Normal sinus rhythm Normal ECG No acute changes Confirmed by Addison Lank [phone]) on 02/27/2021 11:50:19 PM     Radiology DG Skull 1-3 Views  Result Date: 02/27/2021 CLINICAL DATA:  Evaluate for shunt integrity, initial encounter EXAM: SKULL - 1 VIEW COMPARISON:  06/07/2018 FINDINGS: Ventricular shunt catheter is again identified on the right and stable from the prior exam. IMPRESSION: Stable ventricular shunt catheter. Electronically Signed   By: Inez Catalina M.D.   On: 02/27/2021 19:06   DG Chest 1 View  Result Date: 02/27/2021 CLINICAL DATA:  Evaluate shunt malfunction EXAM: CHEST  1 VIEW COMPARISON:  06/07/2018 FINDINGS: Ventricular venous shunt catheter is noted extending over the right atrium stable in appearance from the prior exam. Cardiac shadow is stable. Lungs are clear. No bony abnormality is noted. IMPRESSION: Shunt catheter is intact and stable from the prior exam. No new focal abnormality is noted. Electronically Signed   By: Inez Catalina M.D.   On: 02/27/2021 19:07   DG Abd 1 View  Result Date: 02/27/2021 CLINICAL DATA:  Seizures, evaluate for shunt malfunction EXAM: ABDOMEN - 1 VIEW COMPARISON:  Radiograph 11/14/2012 FINDINGS: Shunt catheter tubing is not visualized over the abdomen or pelvis. Nonobstructive bowel gas pattern. No suspicious abdominal calcifications. Phleboliths in the pelvis are benign. No acute or worrisome osseous lesions. IMPRESSION: No visible shunt catheter  tubing is seen over the abdomen or pelvis. No acute abdominopelvic radiographic abnormality. Electronically Signed   By: Lovena Le M.D.   On: 02/27/2021 19:16   CT Head Wo Contrast  Addendum Date: 02/27/2021   ADDENDUM REPORT: 02/27/2021 19:21 ADDENDUM: These results were called by telephone at the time of physician contact on 02/27/2021 at 7:21 pm to provider [facility] , who verbally acknowledged these results. Electronically Signed   By: Lovena Le M.D.   On: 02/27/2021 19:21   Result Date: 02/27/2021 CLINICAL DATA:  Seizures, history of brain cancer, stroke in seizure EXAM: CT HEAD WITHOUT CONTRAST TECHNIQUE: Contiguous axial images were obtained from the base of the skull through the vertex without intravenous contrast. COMPARISON:  CT 06/07/2018, MR 04/17/2019 FINDINGS: Brain: Examination is somewhat limited by extensive metallic streak artifact from a left temporoparietal battery pack for cochlear device. A right parietal approach ventriculostomy catheter terminates in the body of the left lateral ventricle, in stable positioning from comparison prior. Ventricular caliber is grossly unchanged from previous studies. Calcifications of the right frontal lobe may reflect a prior ventriculostomy catheter site, unchanged from previous exams. Dense parenchymal calcifications are noted in the occipital lobes and cerebellar hemispheres also unchanged from prior and likely reflective of radiation related changes. Additional postsurgical changes from suboccipital craniectomy mass resection. Chronic lacunar type infarcts again seen in the right thalamus and bilateral basal ganglia. New hypoattenuating focus present in the left pons (3/11) no evidence of acute hemorrhage, hydrocephalus, extra-axial collection, visible recurrent mass lesion or mass effect within the limitations of an unenhanced CT. Additional additional patchy areas of white matter hypoattenuation are most compatible with chronic  microvascular angiopathy. Symmetric prominence of the cisterns and sulci compatible with parenchymal volume loss. Vascular: Atherosclerotic calcification of the carotid siphons and intradural vertebral arteries. No hyperdense vessel. Skull:  Suboccipital craniectomy. No acute or worrisome osseous abnormality. Sinuses/Orbits: Prior left mastoidectomy and placement of a left cochlear implant. Small bilateral mastoid effusions. Paranasal sinuses are clear. Debris in the bilateral external auditory canals. Other: Lentiform fluid collection at the craniotomy site is unchanged from comparison and likely reflects chronic seroma IMPRESSION: New rounded hypoattenuating focus in the left paramedian pons, could reflect interval lacunar infarct though other underlying lesion or hemorrhage ischemic process may be present. Consider further evaluation with MR imaging. Postsurgical changes from prior suboccipital craniectomy and mass resection with additional features of prior radiation related change including coarse calcifications throughout the occipital lobes, cerebellar hemispheres and central pons. Background of parenchymal volume loss and microvascular angiopathy with intracranial atherosclerosis. Left cochlear implant. Battery pack results in streak artifact across the left convexity. Bilateral mastoid effusions. Debris in the bilateral external auditory canals. Electronically Signed: By: Lovena Le M.D. On: 02/27/2021 19:14    Procedures Procedures   Medications Ordered in ED Medications  levETIRAcetam (KEPPRA) IVPB 1500 mg/ 100 mL premix (0 mg Intravenous Stopped 02/27/21 2030)    ED Course  I have reviewed the triage vital signs and the nursing notes.  Pertinent labs & imaging results that were available during my care of the patient were reviewed by me and considered in my medical decision making (see chart for details).    MDM Rules/Calculators/A&P                           Patient presents after  presumed seizure.  Per EMS patient was postictal upon their arrival.  Patient has a history of same but it has been more than a year since his last seizure.  Has not been fully compliant with his medications in the last week or so.  Patient is taking Plavix secondary to previous CVA.  Did have a fall today.  Also has VP shunt in place.  Will image.  CT scan with questionable acute/subacute lacunar infarct in the left pons.  I discussed this with radiology.  Given patient's abnormal brain anatomy it is difficult to determine if this abnormal finding.  Will obtain MRI.  9:54 PM Patient continues to be alert and oriented.  Answering questions appropriately.  No additional seizures here in the emergency department.  12:13 AM At shift change care was transferred to Dr. Leonette Monarch who will follow pending studies, re-evaulate and determine disposition.     Final Clinical Impression(s) / ED Diagnoses Final diagnoses:  Seizure [facility])    Rx / [place] Orders ED Discharge Orders    None       Imogine Carvell, Gwenlyn Perking 02/28/21 0014    Fatima Blank, MD 02/28/21 [phone]

## 2021-02-28 NOTE — Discharge Instructions (Signed)
Please take your seizure medication as you are prescribed to prevent frequent seizures. Follow-up with your primary care provider/neurologist.

## 2021-02-28 NOTE — ED Notes (Signed)
Patient's POA, April, contacted with update and informed about D/C status.

## 2021-03-02 LAB — TOPIRAMATE LEVEL: Topiramate Lvl: 6.7 ug/mL (ref 2.0–25.0)

## 2021-03-05 DIAGNOSIS — G4733 Obstructive sleep apnea (adult) (pediatric): Secondary | ICD-10-CM | POA: Diagnosis not present

## 2021-03-05 DIAGNOSIS — H903 Sensorineural hearing loss, bilateral: Secondary | ICD-10-CM | POA: Diagnosis not present

## 2021-03-05 DIAGNOSIS — Z4889 Encounter for other specified surgical aftercare: Secondary | ICD-10-CM | POA: Diagnosis not present

## 2021-03-05 DIAGNOSIS — Z8673 Personal history of transient ischemic attack (TIA), and cerebral infarction without residual deficits: Secondary | ICD-10-CM | POA: Diagnosis not present

## 2021-03-05 DIAGNOSIS — Z9621 Cochlear implant status: Secondary | ICD-10-CM | POA: Diagnosis not present

## 2021-03-05 DIAGNOSIS — R569 Unspecified convulsions: Secondary | ICD-10-CM | POA: Diagnosis not present

## 2021-03-23 ENCOUNTER — Ambulatory Visit: Payer: Medicare Other | Admitting: Cardiology

## 2021-03-23 ENCOUNTER — Ambulatory Visit: Payer: Medicare Other

## 2021-03-23 ENCOUNTER — Other Ambulatory Visit: Payer: Self-pay

## 2021-03-23 ENCOUNTER — Encounter: Payer: Self-pay | Admitting: Cardiology

## 2021-03-23 VITALS — BP 104/78 | HR 82 | Ht 68.0 in | Wt 176.0 lb

## 2021-03-23 DIAGNOSIS — R55 Syncope and collapse: Secondary | ICD-10-CM

## 2021-03-23 DIAGNOSIS — I4729 Other ventricular tachycardia: Secondary | ICD-10-CM

## 2021-03-23 DIAGNOSIS — I69359 Hemiplegia and hemiparesis following cerebral infarction affecting unspecified side: Secondary | ICD-10-CM

## 2021-03-23 DIAGNOSIS — R0789 Other chest pain: Secondary | ICD-10-CM

## 2021-03-23 DIAGNOSIS — C719 Malignant neoplasm of brain, unspecified: Secondary | ICD-10-CM

## 2021-03-23 DIAGNOSIS — I493 Ventricular premature depolarization: Secondary | ICD-10-CM

## 2021-03-23 DIAGNOSIS — I69398 Other sequelae of cerebral infarction: Secondary | ICD-10-CM

## 2021-03-23 DIAGNOSIS — I472 Ventricular tachycardia: Secondary | ICD-10-CM | POA: Diagnosis not present

## 2021-03-23 DIAGNOSIS — R569 Unspecified convulsions: Secondary | ICD-10-CM

## 2021-03-23 NOTE — Progress Notes (Signed)
Cardiology Office Note:    Date:  03/23/2021   ID:  [name] Love, DOB 1971/03/14, MRN [id number]  PCP:  Leonides Sake, MD  Cardiologist:  Jenne Campus, MD    Referring MD: Leonides Sake, MD   Chief Complaint  Patient presents with   Follow-up  I had seizures  History of Present Illness:    [name] Love is a 50 y.o. male with complex past medical history which include brain cancer, history of CVA, I been following him for frequent ventricular ectopy.  Monitor showed total burden of 13.3% as well as 1 run of nonsustained ventricular tachycardia.  8 beats.  Rate was 255 bpm.  At that time he was completely asymptomatic.  Echocardiogram being done which showed preserved left ventricle ejection fraction and then eventually we end up treating this with small dose of beta-blocker.  However now he presented with episode of seizures.  Apparently he was postictal after that and obviously he does have organic reason for having seizures.  However, I want to make sure that this episode is not related to any significant arrhythmia.  Otherwise he is fine he denies have any chest pain tightness squeezing pressure burning chest.  Past Medical History:  Diagnosis Date   Abnormal gait    Acute arterial ischemic stroke, vertebrobasilar, thalamic ([place]) 06/08/2018   Acute CVA (cerebrovascular accident) ([place]) 06/07/2018   Allergic rhinitis    Anxiety    Arthritis 11/05/2013   Atypical chest pain 11/30/2017   Bilateral arm weakness    Bilateral hearing loss 07/28/2020   Brain cancer ([place])    Cerebral thrombosis with cerebral infarction 04/17/2019   Cochlear implant in place with multiple channels 05/15/2020   CVA (cerebrovascular accident) ([place]) 04/18/2019   Depression    Diplopia    Dyspnea on exertion 11/30/2017   Ependymoma of brain ([place])    Episode of change in speech 04/17/2019   Generalized anxiety disorder 11/28/2018   Headache 11/05/2013   Hearing loss, sensorineural    Hemiparesis and alteration of  sensations as late effects of stroke ([place]) 09/04/2018   History of stroke 04/01/2020   Hypothyroidism    Insomnia    Left leg weakness    Low vitamin B12 level    Memory loss 11/05/2013   Myalgia 04/18/2017   OSA on CPAP 11/28/2018   Recurrent falls    Refusal of blood transfusions as patient is Jehovah's Witness 04/01/2020   Seizure ([place])    Seizures ([place])    Sleeping difficulty 11/05/2013   Stroke-like symptoms    Ventricular ectopy 06/04/2020   Weakness 04/16/2019   Weakness generalized 04/17/2019    Past Surgical History:  Procedure Laterality Date   BRAIN SURGERY     HERNIA REPAIR     SHUNT REVISION      Current Medications: Current Meds  Medication Sig   ALPRAZolam (XANAX) 0.5 MG tablet Take 0.5 mg by mouth 2 (two) times daily.   clopidogrel (PLAVIX) 75 MG tablet Take 75 mg by mouth daily.   metoprolol succinate (TOPROL-XL) 50 MG 24 hr tablet Take 1 tablet (50 mg total) by mouth daily. Take with or immediately following a meal.   topiramate (TOPAMAX) 50 MG tablet Take 50 mg by mouth 2 (two) times daily. Take 50mg  every am and 100mg  every evening     Allergies:   Baclofen, Seroquel [quetiapine], Sulfa antibiotics, Coconut flavor, and Contrast media [iodinated diagnostic agents]   Social History   Socioeconomic History   Marital status:  Single    Spouse name: Not on file   Number of children: 0   Years of education: 12   Highest education level: Not on file  Occupational History   Occupation: Disabled  Tobacco Use   Smoking status: Never   Smokeless tobacco: Never  Vaping Use   Vaping Use: Never used  Substance and Sexual Activity   Alcohol use: No   Drug use: No   Sexual activity: Yes    Birth control/protection: None  Other Topics Concern   Not on file  Social History Narrative   Lives with Aunt [name])   Caffeine use: No coffee   Soda- trying to quit      Right-handed   Social Determinants of Health   Financial Resource Strain: Not on file  Food  Insecurity: Not on file  Transportation Needs: Not on file  Physical Activity: Not on file  Stress: Not on file  Social Connections: Not on file     Family History: The patient's family history includes Brain cancer in his mother; High blood pressure in his mother. ROS:   Please see the history of present illness.    All 14 point review of systems negative except as described per history of present illness  EKGs/Labs/Other Studies Reviewed:      Recent Labs: 02/27/2021: ALT 11; BUN 6; Creatinine, Ser 1.29; Hemoglobin 14.8; Platelets 201; Potassium 3.0; Sodium 141  Recent Lipid Panel    Component Value Date/Time   CHOL 119 04/17/2019 0810   TRIG 104 04/17/2019 0810   HDL 34 (L) 04/17/2019 0810   CHOLHDL 3.5 04/17/2019 0810   VLDL 21 04/17/2019 0810   LDLCALC 64 04/17/2019 0810    Physical Exam:    VS:  Ht 5\' 8"  (1.727 m)   Wt 176 lb (79.8 kg)   BMI 26.76 kg/m     Wt Readings from Last 3 Encounters:  03/23/21 176 lb (79.8 kg)  02/27/21 173 lb (78.5 kg)  02/10/21 173 lb (78.5 kg)     GEN:  Well nourished, well developed in no acute distress HEENT: Normal NECK: No JVD; No carotid bruits LYMPHATICS: No lymphadenopathy CARDIAC: RRR, no murmurs, no rubs, no gallops RESPIRATORY:  Clear to auscultation without rales, wheezing or rhonchi  ABDOMEN: Soft, non-tender, non-distended MUSCULOSKELETAL:  No edema; No deformity  SKIN: Warm and dry LOWER EXTREMITIES: no swelling NEUROLOGIC:  Alert and oriented x 3 PSYCHIATRIC:  Normal affect   ASSESSMENT:    1. Ventricular ectopy   2. Nonsustained ventricular tachycardia ([place])   3. Hemiparesis and alteration of sensations as late effects of stroke ([place])   4. Malignant neoplasm of brain, unspecified location ([place])   5. Atypical chest pain   6. Seizure ([place])    PLAN:    In order of problems listed above:  Ventricular ectopy, seizures I am worried that may be episode of seizure and was truly some arrhythmia related.   However, usually people recovering quite easily and quickly after that meaning did not postictal however with him having difficult hearing it could be difficult to assess how quickly that recovery happened.  I think we need to repeat monitor for 7 days, will also get echocardiogram to assess left ventricle ejection fraction again. Nonsustained ventricular tachycardia: Plan as described above Malignant brain cancer.  Noted Atypical chest pain: Denies having any Seizures treated with medications and he admits that sometimes he forgets to take his medications   Medication Adjustments/Labs and Tests Ordered: Current medicines are  reviewed at length with the patient today.  Concerns regarding medicines are outlined above.  No orders of the defined types were placed in this encounter.  Medication changes: No orders of the defined types were placed in this encounter.   Signed, Park Liter, MD, [facility] 03/23/2021 4:41 PM    [facility]

## 2021-03-23 NOTE — Addendum Note (Signed)
Addended by: Senaida Ores on: 03/23/2021 05:00 PM   Modules accepted: Orders

## 2021-03-23 NOTE — Patient Instructions (Signed)
Medication Instructions:  Your physician recommends that you continue on your current medications as directed. Please refer to the Current Medication list given to you today.  *If you need a refill on your cardiac medications before your next appointment, please call your pharmacy*   Lab Work: None If you have labs (blood work) drawn today and your tests are completely normal, you will receive your results only by: [place] (if you have MyChart) OR A paper copy in the mail If you have any lab test that is abnormal or we need to change your treatment, we will call you to review the results.   Testing/Procedures: A zio monitor was ordered today. It will remain on for 7 days. You will then return monitor and event diary in provided box. It takes 1-2 weeks for report to be downloaded and returned to [place]. We will call you with the results. If monitor falls off or has orange flashing light, please call Zio for further instructions.     Follow-Up: At [facility], you and your health needs are our priority.  As part of our continuing mission to provide you with exceptional heart care, we have created designated Provider Care Teams.  These Care Teams include your primary Cardiologist (physician) and Advanced Practice Providers (APPs -  Physician Assistants and Nurse Practitioners) who all work together to provide you with the care you need, when you need it.  We recommend signing up for the patient portal called "MyChart".  Sign up information is provided on this After Visit Summary.  MyChart is used to connect with patients for Virtual Visits (Telemedicine).  Patients are able to view lab/test results, encounter notes, upcoming appointments, etc.  Non-urgent messages can be sent to your provider as well.   To learn more about what you can do with MyChart, go to [email or web address].    Your next appointment:   6 week(s)  The format for your next appointment:   In Person  Provider:    Jenne Campus, MD   Other Instructions

## 2021-03-24 ENCOUNTER — Ambulatory Visit: Payer: Medicare Other | Admitting: Neurology

## 2021-03-24 NOTE — Progress Notes (Deleted)
PATIENT: [name] Love DOB: 1971-04-09  REASON FOR VISIT: follow up HISTORY FROM: patient  HISTORY OF PRESENT ILLNESS: Today 03/24/21 [name] Love is a 50 year old male with history of seizures history of ependymoma, status postradiation therapy and resection.  On Topamax 50/100. In the ER 02/27/21, was at his grandmother's house, she heard a fall to the floor, found him awake, but he appeared postictal.  He was agitated for EMS transport.  Apparently missed a few doses of Topamax.  MRI of the brain 02/27/2021 IMPRESSION: 1. Severely limited exam due to extensive susceptibility artifact from a left cochlear implant, markedly limiting assessment. 2. No definite acute intracranial abnormality. 3. Postsurgical changes from prior suboccipital craniectomy for tumor resection with underlying post treatment changes throughout the posterior fossa. Progressive T2/FLAIR signal abnormality within the right cerebellum favored to reflect progressive post treatment effect, although locally recurrent disease is difficult to exclude on this limited exam. Consider short interval follow-up MRI, with and without contrast for further evaluation. 4. Right parietal approach VP shunt catheter in place without hydrocephalus. 5. Underlying atrophy with chronic microvascular ischemic disease with multiple remote lacunar infarcts about the thalami, pons, and cerebellum.  Update 02/10/21 SS: [name] Love is a 50 year old male with history of ependymoma, status postradiation therapy and resection.  Small subacute right cerebellar infarct in 2020, is on Plavix.  Takes Topamax for seizures.  Chronic issues with balance and gait instability. Has seen cardiology, heart monitor showed frequent ventricular ectopy with runs of nonsustained ventricular tachycardia, is on beta blocker. Has left cochlear implant, planning to have right implant soon. Chronic weakness to left leg, speech alteration.  His aunt passed away, he is now  living alone in an apartment.  He drives a car.  Has poor vision, sees ophthalmology, has prisms. Claims soon won't be able to drive at night.  Has a service dog.  Here today for evaluation unaccompanied.  Update 06/10/2020 SS: [name] Love is a 50 year old male with a history of ependymoma, status post radiation therapy and resection.  In summer 2020, he had small subacute right cerebellar infarct.  He is on Plavix.  He is on Topamax for seizures.  Has chronic issues with balance and gait instability.  He had a left cochlear implant placed recently, has a service dog with him today.  Seeing cardiology, currently wearing a heart monitor.  No recurrent seizure.  He is living with his aunt, but indicates he will be living alone soon for a better home environment.  He is able to drive, but not at night.  Has chronic weakness to the left leg, speech alteration.  He is being evaluated for possible prostate cancer.  He presents today for evaluation.  HISTORY  10/08/2019 SS: [name] Love is a 50 year old male with history of ependymoma, status post radiation therapy and resection.  In the summer 2020, he suffered a small subacute right cerebellar infarct.  He remains on Plavix.  He also has history of seizures, he remains on Topamax.  He has not had recurrent seizure.  He continues to complain of chronic issues with his balance and gait instability.  He reports this is worse at night, he has had a few falls, but has not been injured.  He drives a car without difficulty.  He does not require assistive device for ambulation.  He lives with his aunt.  He continues to report some speech alteration since the stroke.  He is no longer in therapy.  He thinks his speech would have  improved more if he would have been able to complete inpatient rehab therapy.  He reports he has some left leg weakness as result of the stroke.  He is hard of hearing.  He presents today for evaluation unaccompanied.  REVIEW OF SYSTEMS: Out of a complete 14  system review of symptoms, the patient complains only of the following symptoms, and all other reviewed systems are negative.  Walking difficulty  ALLERGIES: Allergies  Allergen Reactions   Baclofen     Urinary retension   Seroquel [Quetiapine] Other (See Comments)    Weight gain   Sulfa Antibiotics Other (See Comments)    Reaction ??   Coconut Flavor Rash and Other (See Comments)    ANYTHING COCONUT- allergic to this   Contrast Media [Iodinated Diagnostic Agents] Rash    HOME MEDICATIONS: Outpatient Medications Prior to Visit  Medication Sig Dispense Refill   ALPRAZolam (XANAX) 0.5 MG tablet Take 0.5 mg by mouth 2 (two) times daily.  2   clopidogrel (PLAVIX) 75 MG tablet Take 75 mg by mouth daily.     metoprolol succinate (TOPROL-XL) 50 MG 24 hr tablet Take 1 tablet (50 mg total) by mouth daily. Take with or immediately following a meal. 90 tablet 3   topiramate (TOPAMAX) 50 MG tablet Take 50 mg by mouth 2 (two) times daily. Take 50mg  every am and 100mg  every evening     No facility-administered medications prior to visit.    PAST MEDICAL HISTORY: Past Medical History:  Diagnosis Date   Abnormal gait    Acute arterial ischemic stroke, vertebrobasilar, thalamic ([place]) 06/08/2018   Acute CVA (cerebrovascular accident) ([place]) 06/07/2018   Allergic rhinitis    Anxiety    Arthritis 11/05/2013   Atypical chest pain 11/30/2017   Bilateral arm weakness    Bilateral hearing loss 07/28/2020   Brain cancer ([place])    Cerebral thrombosis with cerebral infarction 04/17/2019   Cochlear implant in place with multiple channels 05/15/2020   CVA (cerebrovascular accident) ([place]) 04/18/2019   Depression    Diplopia    Dyspnea on exertion 11/30/2017   Ependymoma of brain ([place])    Episode of change in speech 04/17/2019   Generalized anxiety disorder 11/28/2018   Headache 11/05/2013   Hearing loss, sensorineural    Hemiparesis and alteration of sensations as late effects of stroke ([place]) 09/04/2018    History of stroke 04/01/2020   Hypothyroidism    Insomnia    Left leg weakness    Low vitamin B12 level    Memory loss 11/05/2013   Myalgia 04/18/2017   OSA on CPAP 11/28/2018   Recurrent falls    Refusal of blood transfusions as patient is Jehovah's Witness 04/01/2020   Seizure ([place])    Seizures ([place])    Sleeping difficulty 11/05/2013   Stroke-like symptoms    Ventricular ectopy 06/04/2020   Weakness 04/16/2019   Weakness generalized 04/17/2019    PAST SURGICAL HISTORY: Past Surgical History:  Procedure Laterality Date   BRAIN SURGERY     HERNIA REPAIR     SHUNT REVISION      FAMILY HISTORY: Family History  Problem Relation Age of Onset   Brain cancer Mother    High blood pressure Mother     SOCIAL HISTORY: Social History   Socioeconomic History   Marital status: Single    Spouse name: Not on file   Number of children: 0   Years of education: 12   Highest education level: Not on file  Occupational  History   Occupation: Disabled  Tobacco Use   Smoking status: Never   Smokeless tobacco: Never  Vaping Use   Vaping Use: Never used  Substance and Sexual Activity   Alcohol use: No   Drug use: No   Sexual activity: Yes    Birth control/protection: None  Other Topics Concern   Not on file  Social History Narrative   Lives with Aunt [name])   Caffeine use: No coffee   Soda- trying to quit      Right-handed   Social Determinants of Health   Financial Resource Strain: Not on file  Food Insecurity: Not on file  Transportation Needs: Not on file  Physical Activity: Not on file  Stress: Not on file  Social Connections: Not on file  Intimate Partner Violence: Not on file   PHYSICAL EXAM  There were no vitals filed for this visit.  There is no height or weight on file to calculate BMI.  Generalized: Well developed, in no acute distress   Neurological examination  Mentation: Alert oriented to time, place, history taking. Follows all commands speech and  language fluent (speech is like an accent), hard of hearing Cranial nerve II-XII: Pupils were equal round reactive to light. Extraocular movements were full, visual field were full on confrontational test. Facial sensation and strength were normal.  Head turning and shoulder shrug were normal and symmetric. Motor: The motor testing reveals 5 over 5 strength of all 4 extremities. Good symmetric motor tone is noted throughout.  Sensory: Sensory testing is intact to soft touch on all 4 extremities. No evidence of extinction is noted.  Coordination: Cerebellar testing reveals good finger-nose-finger and heel-to-shin bilaterally.  Gait and station: Gait is slightly wide-based, but steady, slight limp on the left, can walk independently. Reflexes: Deep tendon reflexes are symmetric and normal bilaterally.   DIAGNOSTIC DATA (LABS, IMAGING, TESTING) - I reviewed patient records, labs, notes, testing and imaging myself where available.  Lab Results  Component Value Date   WBC 6.1 02/27/2021   HGB 14.8 02/27/2021   HCT 44.4 02/27/2021   MCV 91.9 02/27/2021   PLT 201 02/27/2021      Component Value Date/Time   NA 141 02/27/2021 1813   NA 142 08/23/2017 1059   NA 142 11/06/2014 1751   K 3.0 (L) 02/27/2021 1813   K 3.7 11/06/2014 1751   CL 113 (H) 02/27/2021 1813   CL 106 11/06/2014 1751   CO2 23 02/27/2021 1813   CO2 29 11/06/2014 1751   GLUCOSE 88 02/27/2021 1813   GLUCOSE 91 11/06/2014 1751   BUN 6 02/27/2021 1813   BUN 17 08/23/2017 1059   BUN 14 11/06/2014 1751   CREATININE 1.29 (H) 02/27/2021 1813   CREATININE 1.11 11/06/2014 1751   CALCIUM 8.3 (L) 02/27/2021 1813   CALCIUM 8.8 11/06/2014 1751   PROT 5.7 (L) 02/27/2021 1813   PROT 6.5 08/23/2017 1059   PROT 6.9 11/06/2014 1751   ALBUMIN 3.5 02/27/2021 1813   ALBUMIN 4.5 08/23/2017 1059   ALBUMIN 3.8 11/06/2014 1751   AST 13 (L) 02/27/2021 1813   AST 33 11/06/2014 1751   ALT 11 02/27/2021 1813   ALT 55 11/06/2014 1751    ALKPHOS 71 02/27/2021 1813   ALKPHOS 89 11/06/2014 1751   BILITOT 0.7 02/27/2021 1813   BILITOT <0.2 08/23/2017 1059   BILITOT 0.4 11/06/2014 1751   GFRNONAA >60 02/27/2021 1813   GFRNONAA >60 11/06/2014 1751   GFRNONAA >60 03/12/2014 1140  GFRAA >60 04/17/2019 0810   GFRAA >60 11/06/2014 1751   GFRAA >60 03/12/2014 1140   Lab Results  Component Value Date   CHOL 119 04/17/2019   HDL 34 (L) 04/17/2019   LDLCALC 64 04/17/2019   TRIG 104 04/17/2019   CHOLHDL 3.5 04/17/2019   Lab Results  Component Value Date   HGBA1C 5.3 04/17/2019   Lab Results  Component Value Date   VITAMINB12 238 04/17/2019   Lab Results  Component Value Date   TSH 4.294 04/17/2019   ASSESSMENT AND PLAN 50 y.o. year old male  has a past medical history of Abnormal gait, Acute arterial ischemic stroke, vertebrobasilar, thalamic ([place]) (06/08/2018), Acute CVA (cerebrovascular accident) ([place]) (06/07/2018), Allergic rhinitis, Anxiety, Arthritis (11/05/2013), Atypical chest pain (11/30/2017), Bilateral arm weakness, Bilateral hearing loss (07/28/2020), Brain cancer ([place]), Cerebral thrombosis with cerebral infarction (04/17/2019), Cochlear implant in place with multiple channels (05/15/2020), CVA (cerebrovascular accident) ([place]) (04/18/2019), Depression, Diplopia, Dyspnea on exertion (11/30/2017), Ependymoma of brain [facility]), Episode of change in speech (04/17/2019), Generalized anxiety disorder (11/28/2018), Headache (11/05/2013), Hearing loss, sensorineural, Hemiparesis and alteration of sensations as late effects of stroke ([place]) (09/04/2018), History of stroke (04/01/2020), Hypothyroidism, Insomnia, Left leg weakness, Low vitamin B12 level, Memory loss (11/05/2013), Myalgia (04/18/2017), OSA on CPAP (11/28/2018), Recurrent falls, Refusal of blood transfusions as patient is Jehovah's Witness (04/01/2020), Seizure ([place]), Seizures ([place]), Sleeping difficulty (11/05/2013), Stroke-like symptoms, Ventricular ectopy (06/04/2020), Weakness (04/16/2019),  and Weakness generalized (04/17/2019). here with:  1.  Right cerebellar infarct 2.  Gait disturbance 3.  History of seizures  -Appears to have remained overall neurologically stable, has chronic speech deficit and gait instability -No recurrent seizures, will continue Topamax 50/100 -Continue Plavix following CVA -Continue Topamax 50 mg am/100 mg pm -Seeing cardiology, on metoprolol, continue to see PCP -Follow-up here in 1 year or sooner if needed  Evangeline Dakin, DNP 03/24/2021, 5:41 AM [facility] [address], [place], [place] [address] [phone]

## 2021-03-25 DIAGNOSIS — G40909 Epilepsy, unspecified, not intractable, without status epilepticus: Secondary | ICD-10-CM | POA: Diagnosis not present

## 2021-03-25 DIAGNOSIS — Z6826 Body mass index (BMI) 26.0-26.9, adult: Secondary | ICD-10-CM | POA: Diagnosis not present

## 2021-03-25 DIAGNOSIS — I472 Ventricular tachycardia: Secondary | ICD-10-CM | POA: Diagnosis not present

## 2021-03-25 DIAGNOSIS — F419 Anxiety disorder, unspecified: Secondary | ICD-10-CM | POA: Diagnosis not present

## 2021-04-07 DIAGNOSIS — I493 Ventricular premature depolarization: Secondary | ICD-10-CM | POA: Diagnosis not present

## 2021-04-07 DIAGNOSIS — I472 Ventricular tachycardia: Secondary | ICD-10-CM | POA: Diagnosis not present

## 2021-04-07 DIAGNOSIS — I69359 Hemiplegia and hemiparesis following cerebral infarction affecting unspecified side: Secondary | ICD-10-CM | POA: Diagnosis not present

## 2021-04-07 DIAGNOSIS — I69398 Other sequelae of cerebral infarction: Secondary | ICD-10-CM | POA: Diagnosis not present

## 2021-04-10 ENCOUNTER — Telehealth: Payer: Self-pay | Admitting: Cardiology

## 2021-04-10 NOTE — Telephone Encounter (Signed)
Pt is calling in regards to obtaining his results from his most recent heart test. Please advise pt further

## 2021-04-13 ENCOUNTER — Telehealth: Payer: Self-pay | Admitting: Neurology

## 2021-04-13 ENCOUNTER — Telehealth: Payer: Self-pay | Admitting: Diagnostic Neuroimaging

## 2021-04-13 DIAGNOSIS — R930 Abnormal findings on diagnostic imaging of skull and head, not elsewhere classified: Secondary | ICD-10-CM | POA: Diagnosis not present

## 2021-04-13 DIAGNOSIS — R42 Dizziness and giddiness: Secondary | ICD-10-CM | POA: Diagnosis not present

## 2021-04-13 DIAGNOSIS — R296 Repeated falls: Secondary | ICD-10-CM | POA: Diagnosis not present

## 2021-04-13 DIAGNOSIS — Z85841 Personal history of malignant neoplasm of brain: Secondary | ICD-10-CM | POA: Diagnosis not present

## 2021-04-13 NOTE — Telephone Encounter (Signed)
error 

## 2021-04-13 NOTE — Telephone Encounter (Signed)
Scheduled with sarah tomorrow 04/13/21

## 2021-04-13 NOTE — Telephone Encounter (Signed)
Pt. Came in today needing a sooner appt than 8/29 due to his PCP saying they found another brain tumor and advising him to see [place] ASAP. Best call back is [phone]

## 2021-04-14 ENCOUNTER — Ambulatory Visit (INDEPENDENT_AMBULATORY_CARE_PROVIDER_SITE_OTHER): Payer: Medicare Other | Admitting: Neurology

## 2021-04-14 ENCOUNTER — Encounter: Payer: Self-pay | Admitting: Neurology

## 2021-04-14 ENCOUNTER — Other Ambulatory Visit: Payer: Self-pay

## 2021-04-14 ENCOUNTER — Telehealth: Payer: Self-pay

## 2021-04-14 VITALS — BP 120/76 | HR 80 | Ht 68.0 in | Wt 176.0 lb

## 2021-04-14 DIAGNOSIS — I639 Cerebral infarction, unspecified: Secondary | ICD-10-CM | POA: Diagnosis not present

## 2021-04-14 DIAGNOSIS — C719 Malignant neoplasm of brain, unspecified: Secondary | ICD-10-CM

## 2021-04-14 DIAGNOSIS — R569 Unspecified convulsions: Secondary | ICD-10-CM

## 2021-04-14 DIAGNOSIS — Z0289 Encounter for other administrative examinations: Secondary | ICD-10-CM

## 2021-04-14 MED ORDER — TOPIRAMATE 50 MG PO TABS: ORAL_TABLET | ORAL | 3 refills | Status: DC

## 2021-04-14 NOTE — Telephone Encounter (Signed)
Called and spoke to patient.  He is okay to wait until follow up MRI completed and resulted.  Will call once he has had the MRI.  Patient denied further questions, verbalized understanding and expressed appreciation for the phone call.

## 2021-04-14 NOTE — Progress Notes (Signed)
PATIENT: [name] Love DOB: 11/12/1970  REASON FOR VISIT: follow up HISTORY FROM: patient Primary Neurologist: Dr. Jannifer Franklin   HISTORY OF PRESENT ILLNESS: Today 04/14/21 [name] Love is a 50 year old male with history of seizures history of ependymoma, status postradiation therapy and resection.  On Topamax 50/100. In the ER 02/27/21, was at his grandmother's house, she heard a fall to the floor, found him awake, but he appeared postictal.  He was agitated for EMS transport.  Apparently missed a few doses of Topamax. MRI of brain was done in ER, was severely limited due to artifact from left cochlear implant, area in right cerebellum favored to reflect post treatment effect, but recurrent disease cannot be excluded, recommended short term interval MRI repeat with and without contrast (02/28/21). Right cochlear implant on hold. Cardiology has given a good report.   On May 2nd, psychiatrist started on Luvox for OCD tendencies. He claims he didn't miss any doses of Topamax (ER reports is different). With the Luvox really sleepy, falling asleep, likely fell asleep without taking evening dose. Continues with chronic gait instability. Stopped Luvox over 4-5 days, last day was [name] 15 th. 2 seizures after ER visit, all he fell, when April got there for last 2 (was at grandmother's cleaning her house), sitting down, babbling, confused. Back to baseline 45 minutes. Saw PCP yesterday, MRI brain was ordered by primary care. Here with family member, April. No seizures since stopping Luvox.   MRI of the brain 02/27/2021 IMPRESSION: 1. Severely limited exam due to extensive susceptibility artifact from a left cochlear implant, markedly limiting assessment. 2. No definite acute intracranial abnormality. 3. Postsurgical changes from prior suboccipital craniectomy for tumor resection with underlying post treatment changes throughout the posterior fossa. Progressive T2/FLAIR signal abnormality within the right  cerebellum favored to reflect progressive post treatment effect, although locally recurrent disease is difficult to exclude on this limited exam. Consider short interval follow-up MRI, with and without contrast for further evaluation. 4. Right parietal approach VP shunt catheter in place without hydrocephalus. 5. Underlying atrophy with chronic microvascular ischemic disease with multiple remote lacunar infarcts about the thalami, pons, and cerebellum.  Update 02/10/21 SS: [name] Love is a 50 year old male with history of ependymoma, status postradiation therapy and resection.  Small subacute right cerebellar infarct in 2020, is on Plavix.  Takes Topamax for seizures.  Chronic issues with balance and gait instability. Has seen cardiology, heart monitor showed frequent ventricular ectopy with runs of nonsustained ventricular tachycardia, is on beta blocker. Has left cochlear implant, planning to have right implant soon. Chronic weakness to left leg, speech alteration.  His aunt passed away, he is now living alone in an apartment.  He drives a car.  Has poor vision, sees ophthalmology, has prisms. Claims soon won't be able to drive at night.  Has a service dog.  Here today for evaluation unaccompanied.  Update 06/10/2020 SS: [name] Love is a 50 year old male with a history of ependymoma, status post radiation therapy and resection.  In summer 2020, he had small subacute right cerebellar infarct.  He is on Plavix.  He is on Topamax for seizures.  Has chronic issues with balance and gait instability.  He had a left cochlear implant placed recently, has a service dog with him today.  Seeing cardiology, currently wearing a heart monitor.  No recurrent seizure.  He is living with his aunt, but indicates he will be living alone soon for a better home environment.  He is able to drive,  but not at night.  Has chronic weakness to the left leg, speech alteration.  He is being evaluated for possible prostate cancer.  He  presents today for evaluation.  HISTORY  10/08/2019 SS: [name] Love is a 50 year old male with history of ependymoma, status post radiation therapy and resection.  In the summer 2020, he suffered a small subacute right cerebellar infarct.  He remains on Plavix.  He also has history of seizures, he remains on Topamax.  He has not had recurrent seizure.  He continues to complain of chronic issues with his balance and gait instability.  He reports this is worse at night, he has had a few falls, but has not been injured.  He drives a car without difficulty.  He does not require assistive device for ambulation.  He lives with his aunt.  He continues to report some speech alteration since the stroke.  He is no longer in therapy.  He thinks his speech would have improved more if he would have been able to complete inpatient rehab therapy.  He reports he has some left leg weakness as result of the stroke.  He is hard of hearing.  He presents today for evaluation unaccompanied.  REVIEW OF SYSTEMS: Out of a complete 14 system review of symptoms, the patient complains only of the following symptoms, and all other reviewed systems are negative.  See HPI  ALLERGIES: Allergies  Allergen Reactions   Baclofen     Urinary retension   Seroquel [Quetiapine] Other (See Comments)    Weight gain   Sulfa Antibiotics Other (See Comments)    Reaction ??   Coconut Flavor Rash and Other (See Comments)    ANYTHING COCONUT- allergic to this   Contrast Media [Iodinated Diagnostic Agents] Rash    HOME MEDICATIONS: Outpatient Medications Prior to Visit  Medication Sig Dispense Refill   ALPRAZolam (XANAX) 0.5 MG tablet Take 0.5 mg by mouth 2 (two) times daily.  2   clopidogrel (PLAVIX) 75 MG tablet Take 75 mg by mouth daily.     topiramate (TOPAMAX) 50 MG tablet Take 50 mg by mouth 2 (two) times daily. Take 50mg  every am and 100mg  every evening     metoprolol succinate (TOPROL-XL) 50 MG 24 hr tablet Take 1 tablet (50 mg  total) by mouth daily. Take with or immediately following a meal. 90 tablet 3   No facility-administered medications prior to visit.    PAST MEDICAL HISTORY: Past Medical History:  Diagnosis Date   Abnormal gait    Acute arterial ischemic stroke, vertebrobasilar, thalamic ([place]) 06/08/2018   Acute CVA (cerebrovascular accident) ([place]) 06/07/2018   Allergic rhinitis    Anxiety    Arthritis 11/05/2013   Atypical chest pain 11/30/2017   Bilateral arm weakness    Bilateral hearing loss 07/28/2020   Brain cancer ([place])    Cerebral thrombosis with cerebral infarction 04/17/2019   Cochlear implant in place with multiple channels 05/15/2020   CVA (cerebrovascular accident) ([place]) 04/18/2019   Depression    Diplopia    Dyspnea on exertion 11/30/2017   Ependymoma of brain [facility])    Episode of change in speech 04/17/2019   Generalized anxiety disorder 11/28/2018   Headache 11/05/2013   Hearing loss, sensorineural    Hemiparesis and alteration of sensations as late effects of stroke ([place]) 09/04/2018   History of stroke 04/01/2020   Hypothyroidism    Insomnia    Left leg weakness    Low vitamin B12 level    Memory  loss 11/05/2013   Myalgia 04/18/2017   OSA on CPAP 11/28/2018   Recurrent falls    Refusal of blood transfusions as patient is Jehovah's Witness 04/01/2020   Seizure ([place])    Seizures ([place])    Sleeping difficulty 11/05/2013   Stroke-like symptoms    Ventricular ectopy 06/04/2020   Weakness 04/16/2019   Weakness generalized 04/17/2019    PAST SURGICAL HISTORY: Past Surgical History:  Procedure Laterality Date   BRAIN SURGERY     HERNIA REPAIR     SHUNT REVISION      FAMILY HISTORY: Family History  Problem Relation Age of Onset   Brain cancer Mother    High blood pressure Mother     SOCIAL HISTORY: Social History   Socioeconomic History   Marital status: Single    Spouse name: Not on file   Number of children: 0   Years of education: 12   Highest education level: Not on file   Occupational History   Occupation: Disabled  Tobacco Use   Smoking status: Never   Smokeless tobacco: Never  Vaping Use   Vaping Use: Never used  Substance and Sexual Activity   Alcohol use: No   Drug use: No   Sexual activity: Yes    Birth control/protection: None  Other Topics Concern   Not on file  Social History Narrative   Lives with Aunt [name])   Caffeine use: No coffee   Soda- trying to quit      Right-handed   Social Determinants of Health   Financial Resource Strain: Not on file  Food Insecurity: Not on file  Transportation Needs: Not on file  Physical Activity: Not on file  Stress: Not on file  Social Connections: Not on file  Intimate Partner Violence: Not on file   PHYSICAL EXAM  Vitals:   04/14/21 1348  BP: 120/76  Pulse: 80  Weight: 176 lb (79.8 kg)  Height: 5\' 8"  (1.727 m)    Body mass index is 26.76 kg/m.  Generalized: Well developed, in no acute distress  Neurological examination  Mentation: Alert oriented to time, place, history equally provided by patient and family member. Follows all commands speech and language fluent (speech is like an accent), hearing impaired Cranial nerve II-XII: Pupils were equal round reactive to light. Extraocular movements were full, visual field were full on confrontational test. Facial sensation and strength were normal.  Head turning and shoulder shrug were normal and symmetric. Motor: Good strength of all extremities, no left sided weakness noted Sensory: Sensory testing is intact to soft touch on all 4 extremities. No evidence of extinction is noted.  Coordination: Cerebellar testing reveals good finger-nose-finger and heel-to-shin bilaterally.  Gait and station: Gait is wide-based, cautious, tendency to lean on walls  Reflexes: Deep tendon reflexes are symmetric and normal bilaterally.   DIAGNOSTIC DATA (LABS, IMAGING, TESTING) - I reviewed patient records, labs, notes, testing and imaging myself  where available.  Lab Results  Component Value Date   WBC 6.1 02/27/2021   HGB 14.8 02/27/2021   HCT 44.4 02/27/2021   MCV 91.9 02/27/2021   PLT 201 02/27/2021      Component Value Date/Time   NA 141 02/27/2021 1813   NA 142 08/23/2017 1059   NA 142 11/06/2014 1751   K 3.0 (L) 02/27/2021 1813   K 3.7 11/06/2014 1751   CL 113 (H) 02/27/2021 1813   CL 106 11/06/2014 1751   CO2 23 02/27/2021 1813   CO2 29 11/06/2014  1751   GLUCOSE 88 02/27/2021 1813   GLUCOSE 91 11/06/2014 1751   BUN 6 02/27/2021 1813   BUN 17 08/23/2017 1059   BUN 14 11/06/2014 1751   CREATININE 1.29 (H) 02/27/2021 1813   CREATININE 1.11 11/06/2014 1751   CALCIUM 8.3 (L) 02/27/2021 1813   CALCIUM 8.8 11/06/2014 1751   PROT 5.7 (L) 02/27/2021 1813   PROT 6.5 08/23/2017 1059   PROT 6.9 11/06/2014 1751   ALBUMIN 3.5 02/27/2021 1813   ALBUMIN 4.5 08/23/2017 1059   ALBUMIN 3.8 11/06/2014 1751   AST 13 (L) 02/27/2021 1813   AST 33 11/06/2014 1751   ALT 11 02/27/2021 1813   ALT 55 11/06/2014 1751   ALKPHOS 71 02/27/2021 1813   ALKPHOS 89 11/06/2014 1751   BILITOT 0.7 02/27/2021 1813   BILITOT <0.2 08/23/2017 1059   BILITOT 0.4 11/06/2014 1751   GFRNONAA >60 02/27/2021 1813   GFRNONAA >60 11/06/2014 1751   GFRNONAA >60 03/12/2014 1140   GFRAA >60 04/17/2019 0810   GFRAA >60 11/06/2014 1751   GFRAA >60 03/12/2014 1140   Lab Results  Component Value Date   CHOL 119 04/17/2019   HDL 34 (L) 04/17/2019   LDLCALC 64 04/17/2019   TRIG 104 04/17/2019   CHOLHDL 3.5 04/17/2019   Lab Results  Component Value Date   HGBA1C 5.3 04/17/2019   Lab Results  Component Value Date   VITAMINB12 238 04/17/2019   Lab Results  Component Value Date   TSH 4.294 04/17/2019   ASSESSMENT AND PLAN 50 y.o. year old male  has a past medical history of Abnormal gait, Acute arterial ischemic stroke, vertebrobasilar, thalamic ([place]) (06/08/2018), Acute CVA (cerebrovascular accident) ([place]) (06/07/2018), Allergic rhinitis,  Anxiety, Arthritis (11/05/2013), Atypical chest pain (11/30/2017), Bilateral arm weakness, Bilateral hearing loss (07/28/2020), Brain cancer ([place]), Cerebral thrombosis with cerebral infarction (04/17/2019), Cochlear implant in place with multiple channels (05/15/2020), CVA (cerebrovascular accident) ([place]) (04/18/2019), Depression, Diplopia, Dyspnea on exertion (11/30/2017), Ependymoma of brain ([place]), Episode of change in speech (04/17/2019), Generalized anxiety disorder (11/28/2018), Headache (11/05/2013), Hearing loss, sensorineural, Hemiparesis and alteration of sensations as late effects of stroke ([place]) (09/04/2018), History of stroke (04/01/2020), Hypothyroidism, Insomnia, Left leg weakness, Low vitamin B12 level, Memory loss (11/05/2013), Myalgia (04/18/2017), OSA on CPAP (11/28/2018), Recurrent falls, Refusal of blood transfusions as patient is Jehovah's Witness (04/01/2020), Seizure ([place]), Seizures ([place]), Sleeping difficulty (11/05/2013), Stroke-like symptoms, Ventricular ectopy (06/04/2020), Weakness (04/16/2019), and Weakness generalized (04/17/2019). here with:  1.  Right cerebellar infarct in 2020 on Plavix 2.  Gait disturbance, chronic 3.  History of seizures, on Topamax, recent seizures, occurring May 27-mid [name], none since stopped Luvox 4.  Ependymoma, status post-radiation therapy and resection   -Recurrent seizure May 2022, in setting of missed Topamax dosing, also with Luvox initiation, likely seizure related to these 2 factors  (From [employer]:Neurologic: Seizures have been reported, especially in patients with a history of seizure; avoid use in patients with unstable epilepsy; monitoring recommended if epilepsy is controlled; discontinue use with seizure or increase in seizure frequency)  -PCP has ordered MRI of the brain with and without contrast, asked to have report sent to [place] for review; significant artifact from cochlear implant with MRI in May 2022, need to rule out recurrent disease in right cerebellum    -Will continue Topamax at current dosing 50/100, will not increase at this time, given none since Luvox discontinuation, good compliance with Topamax  -Chronic issues with gait instability, not clear these are any worse, he is  understandably nervous and anxious which worsens his gait, he has service dog  -We have discussed [place] driving restrictions to remain seizure free 6 months   -Keep follow-up appointment in August with Dr. Jannifer Franklin, will watch for MRI to come through, have asked him to bring his POA April with him for next visit  I spent 45 minutes of face-to-face and non-face-to-face time with patient.  This included previsit chart review, lab review, study review, order entry, discussing ER visit, imaging, medications, management, and follow-up.   Butler Denmark, AGNP-C, DNP 04/14/2021, 2:31 PM [facility] [address], [place], [place] [address] [phone]

## 2021-04-14 NOTE — Progress Notes (Signed)
I have read the note, and I agree with the clinical assessment and plan.  [name] Love   

## 2021-04-14 NOTE — Telephone Encounter (Addendum)
Disability form filled out, placed on sarah slack desk for review & signature.

## 2021-04-14 NOTE — Patient Instructions (Signed)
Need to get MRI of the brain, please make sure, primary care is ordering MRI Let us know the results when available Continue topamax  Recommend no driving until seizure free for 6 months Keep appointment with Dr. Jannifer Franklin in August

## 2021-04-22 ENCOUNTER — Other Ambulatory Visit: Payer: Self-pay | Admitting: Cardiology

## 2021-05-07 ENCOUNTER — Telehealth: Payer: Self-pay | Admitting: Neurology

## 2021-05-07 DIAGNOSIS — R42 Dizziness and giddiness: Secondary | ICD-10-CM | POA: Diagnosis not present

## 2021-05-07 DIAGNOSIS — G9389 Other specified disorders of brain: Secondary | ICD-10-CM | POA: Diagnosis not present

## 2021-05-07 DIAGNOSIS — I6782 Cerebral ischemia: Secondary | ICD-10-CM | POA: Diagnosis not present

## 2021-05-07 DIAGNOSIS — G319 Degenerative disease of nervous system, unspecified: Secondary | ICD-10-CM | POA: Diagnosis not present

## 2021-05-07 DIAGNOSIS — R930 Abnormal findings on diagnostic imaging of skull and head, not elsewhere classified: Secondary | ICD-10-CM | POA: Diagnosis not present

## 2021-05-07 DIAGNOSIS — R9082 White matter disease, unspecified: Secondary | ICD-10-CM | POA: Diagnosis not present

## 2021-05-07 NOTE — Telephone Encounter (Signed)
Pt's cousin, April Stephens (on [place]) called, at MRI appt, tech concerned about his shun. If shun needs to be checked  by physician after MRI. Would like a call from the nurse.

## 2021-05-07 NOTE — Telephone Encounter (Signed)
I called [name] Love, the patient will be having an MRI, but he has a programmable shunt in place.  Dr. Christella Noa will see him and reset the shunt after the MRI.

## 2021-05-08 ENCOUNTER — Telehealth: Payer: Self-pay | Admitting: Cardiology

## 2021-05-08 MED ORDER — CLOPIDOGREL BISULFATE 75 MG PO TABS: 75.0000 mg | ORAL_TABLET | Freq: Every day | ORAL | 2 refills | Status: DC

## 2021-05-08 NOTE — Telephone Encounter (Signed)
Refill of Plavix 75 mg sent to [facility] in [place].

## 2021-05-08 NOTE — Telephone Encounter (Signed)
*  STAT* If patient is at the pharmacy, call can be transferred to refill team.   1. Which medications need to be refilled? (please list name of each medication and dose if known)  clopidogrel (PLAVIX) 75 MG tablet  2. Which pharmacy/location (including street and city if local pharmacy) is medication to be sent to? [place] [address]  3. Do they need a 30 day or 90 day supply?  90 day supply

## 2021-05-08 NOTE — Telephone Encounter (Signed)
Patient would like to know when he needs to follow up with Dr. Agustin Cree again. He called and cancelled his appointment do to financial reasons. He also states he received a call to review his monitor results and everything was fine so he assumes an appointment is not necessary at this time. He would like to know if he needs to follow up in another 6 mo-1 year or as needed. Please advise.

## 2021-05-12 NOTE — Telephone Encounter (Signed)
Left message on patients voicemail to please return our call.   

## 2021-05-12 NOTE — Telephone Encounter (Signed)
Spoke to the patient just now and got him scheduled for a 6 month follow up with Dr. Agustin Cree. He verbalizes understanding and thanks [place] for the call back.    Encouraged patient to call back with any questions or concerns.

## 2021-05-13 ENCOUNTER — Ambulatory Visit: Payer: Medicare Other | Admitting: Cardiology

## 2021-05-19 ENCOUNTER — Telehealth: Payer: Self-pay | Admitting: Neurology

## 2021-05-19 NOTE — Telephone Encounter (Signed)
Pt called wanting to know if his paper work had been filled out and when will we be sending it to where it needs to go. Pt is requesting a call back.

## 2021-05-19 NOTE — Telephone Encounter (Signed)
[name] Love is out on leave. Paperwork attached to MRI brain results to be reviewed by Dr. Eugenie Birks. Pt has a pending follow up on 06/01/21.

## 2021-05-21 ENCOUNTER — Telehealth: Payer: Self-pay | Admitting: *Deleted

## 2021-05-21 NOTE — Telephone Encounter (Signed)
Dr. Jannifer Franklin completed and signed the paperwork. It has been return to medical records.

## 2021-05-21 NOTE — Telephone Encounter (Signed)
Pt [employer] form faxed on 05/20/21 [phone]

## 2021-06-01 ENCOUNTER — Ambulatory Visit: Payer: Medicare Other | Admitting: Neurology

## 2021-06-01 ENCOUNTER — Encounter: Payer: Self-pay | Admitting: Neurology

## 2021-06-01 VITALS — BP 118/81 | HR 75 | Wt 179.2 lb

## 2021-06-01 DIAGNOSIS — R413 Other amnesia: Secondary | ICD-10-CM

## 2021-06-01 DIAGNOSIS — R569 Unspecified convulsions: Secondary | ICD-10-CM

## 2021-06-01 DIAGNOSIS — C719 Malignant neoplasm of brain, unspecified: Secondary | ICD-10-CM

## 2021-06-01 DIAGNOSIS — R269 Unspecified abnormalities of gait and mobility: Secondary | ICD-10-CM

## 2021-06-01 NOTE — Progress Notes (Signed)
Reason for visit: Seizures, gait disturbance  [name] Love is an 50 y.o. male  History of present illness:  [name] Love is a 50 year old right-handed white male with a history of an ependymoma, status post resection and radiation in the past.  The patient has a history of seizures.  He recently had a seizure on 27 Feb 2021 when he missed several doses of Topamax.  The patient has done well since that time.  He reports a 57-monthhistory of some worsening gait problems, he may stumble or fall on occasion.  He had MRI done in May 2022 and there was some concern over a recurrence of his brain tumor or stroke.  He had a MRI done on 07 May 2021 with and without contrast, no evidence of recurrent brain tumor or stroke was noted.  The patient was seen by Dr. CChristella Noawho agreed that he did not have recurrence of brain tumor.  The patient has a VP shunt in place that is managed by Dr. CChristella Noa  The patient does operate a motor vehicle but currently cannot drive because of the recent seizure.  Uses a walker at times inside the house.  He is considering a cochlear implant on the right side in the future.  He returns here for an evaluation.  He does have a history of a small right cerebellar stroke in 2020, he remains on Plavix.  Past Medical History:  Diagnosis Date   Abnormal gait    Acute arterial ischemic stroke, vertebrobasilar, thalamic ([place] 06/08/2018   Acute CVA (cerebrovascular accident) ([place] 06/07/2018   Allergic rhinitis    Anxiety    Arthritis 11/05/2013   Atypical chest pain 11/30/2017   Bilateral arm weakness    Bilateral hearing loss 07/28/2020   [place]rain cancer ([place]    Cerebral thrombosis with cerebral infarction 04/17/2019   Cochlear implant in place with multiple channels 05/15/2020   CVA (cerebrovascular accident) ([place] 04/18/2019   Depression    Diplopia    Dyspnea on exertion 11/30/2017   Ependymoma of brain ([place]    Episode of change in speech 04/17/2019   Generalized anxiety disorder  11/28/2018   Headache 11/05/2013   Hearing loss, sensorineural    Hemiparesis and alteration of sensations as late effects of stroke ([place] 09/04/2018   History of stroke 04/01/2020   Hypothyroidism    Insomnia    Left leg weakness    Low vitamin B12 level    Memory loss 11/05/2013   Myalgia 04/18/2017   OSA on CPAP 11/28/2018   Recurrent falls    Refusal of blood transfusions as patient is Jehovah's Witness 04/01/2020   Seizure ([place]    Seizures ([place]    Sleeping difficulty 11/05/2013   Stroke-like symptoms    Ventricular ectopy 06/04/2020   Weakness 04/16/2019   Weakness generalized 04/17/2019    Past Surgical History:  Procedure Laterality Date   BRAIN SURGERY     HERNIA REPAIR     SHUNT REVISION      Family History  Problem Relation Age of Onset   Brain cancer Mother    High blood pressure Mother     Social history:  reports that he has never smoked. He has never used smokeless tobacco. He reports that he does not drink alcohol and does not use drugs.    Allergies  Allergen Reactions   Baclofen     Urinary retension   Seroquel [Quetiapine] Other (See Comments)    Weight gain   Sulfa  Antibiotics Other (See Comments)    Reaction ??   Coconut Flavor Rash and Other (See Comments)    ANYTHING COCONUT- allergic to this   Contrast Media [Iodinated Diagnostic Agents] Rash    Medications:  Prior to Admission medications   Medication Sig Start Date End Date Taking? Authorizing Provider  ALPRAZolam Duanne Moron) 0.5 MG tablet Take 0.5 mg by mouth 2 (two) times daily. 05/30/18  Yes [provider]  clopidogrel (PLAVIX) 75 MG tablet Take 1 tablet (75 mg total) by mouth daily. 05/08/21  Yes Park Liter, MD  metoprolol succinate (TOPROL-XL) 50 MG 24 hr tablet TAKE 1 TABLET(50 MG) BY MOUTH DAILY WITH OR IMMEDIATELY FOLLOWING A MEAL 04/22/21  Yes [place] Liter, MD  topiramate (TOPAMAX) 50 MG tablet Take '50mg'$  every am and '100mg'$  every evening 04/14/21  Yes Suzzanne Cloud, NP     ROS:  Out of a complete 14 system review of symptoms, the patient complains only of the following symptoms, and all other reviewed systems are negative.  Walking difficulty Seizures  Blood pressure 118/81, pulse 75, weight 179 lb 3.2 oz (81.3 kg).  Physical Exam  General: The patient is alert and cooperative at the time of the examination.  The patient is minimally obese.  Skin: No significant peripheral edema is noted.   Neurologic Exam  Mental status: The patient is alert and oriented x 3 at the time of the examination. The patient has apparent normal recent and remote memory, with an apparently normal attention span and concentration ability.   Cranial nerves: Facial symmetry is present. Speech is normal, no aphasia or dysarthria is noted. Extraocular movements are full. Visual fields are full.  Motor: The patient has good strength in all 4 extremities.  Sensory examination: Soft touch sensation is symmetric on the face, arms, and legs.  Coordination: The patient has good finger-nose-finger and heel-to-shin bilaterally.  Gait and station: The patient has a wide-based gait, he can walk without assistance.  Tandem gait was not attempted.  Romberg is positive, the patient goes backwards.  Reflexes: Deep tendon reflexes are symmetric.   MRI brain 05/07/21:  IMPRESSION:  1. Limited examination due to extensive artifact from a cochlear  implant.  2. No evidence of recurrent tumor in the posterior fossa.  3. Stable chronic post treatment and ischemic findings as detailed  above.     Assessment/Plan:  1.  Gait disorder  2.  History of ependymoma  3.  History of seizures  The patient will continue the Topamax for now.  He will follow up here in about 6 months.  I will set him up for home health physical therapy.  He will contact us if there are any other concerns noted.  In the future, he can be followed through Dr. Krista Blue.  Jill Alexanders MD 06/01/2021 2:15  PM  [facility] [address] [place], [place] [address]  Phone [phone] Fax [phone]

## 2021-06-02 ENCOUNTER — Telehealth: Payer: Self-pay | Admitting: Neurology

## 2021-06-02 NOTE — Telephone Encounter (Signed)
I reached out to [place] with [facility] regarding patient's home health referral. She states she had to cross refer patient to [facility] as [facility] does not service [facility], where patient resides.They have accepted his referral and will be seeing him/will reach out to him to start care.

## 2021-07-07 DIAGNOSIS — H903 Sensorineural hearing loss, bilateral: Secondary | ICD-10-CM | POA: Diagnosis not present

## 2021-07-07 DIAGNOSIS — Z539 Procedure and treatment not carried out, unspecified reason: Secondary | ICD-10-CM | POA: Diagnosis not present

## 2021-07-07 DIAGNOSIS — Z8673 Personal history of transient ischemic attack (TIA), and cerebral infarction without residual deficits: Secondary | ICD-10-CM | POA: Diagnosis not present

## 2021-07-07 DIAGNOSIS — F419 Anxiety disorder, unspecified: Secondary | ICD-10-CM | POA: Diagnosis not present

## 2021-07-07 DIAGNOSIS — G4733 Obstructive sleep apnea (adult) (pediatric): Secondary | ICD-10-CM | POA: Diagnosis not present

## 2021-07-07 DIAGNOSIS — G40909 Epilepsy, unspecified, not intractable, without status epilepticus: Secondary | ICD-10-CM | POA: Diagnosis not present

## 2021-08-03 DIAGNOSIS — H524 Presbyopia: Secondary | ICD-10-CM | POA: Diagnosis not present

## 2021-08-13 DIAGNOSIS — Z79899 Other long term (current) drug therapy: Secondary | ICD-10-CM | POA: Diagnosis not present

## 2021-08-13 DIAGNOSIS — F419 Anxiety disorder, unspecified: Secondary | ICD-10-CM | POA: Diagnosis not present

## 2021-08-13 DIAGNOSIS — Z6827 Body mass index (BMI) 27.0-27.9, adult: Secondary | ICD-10-CM | POA: Diagnosis not present

## 2021-09-10 DIAGNOSIS — E785 Hyperlipidemia, unspecified: Secondary | ICD-10-CM | POA: Diagnosis not present

## 2021-09-10 DIAGNOSIS — Z0000 Encounter for general adult medical examination without abnormal findings: Secondary | ICD-10-CM | POA: Diagnosis not present

## 2021-09-10 DIAGNOSIS — Z1331 Encounter for screening for depression: Secondary | ICD-10-CM | POA: Diagnosis not present

## 2021-09-28 ENCOUNTER — Other Ambulatory Visit: Payer: Self-pay | Admitting: Cardiology

## 2021-09-29 NOTE — Telephone Encounter (Signed)
RX sent

## 2021-09-30 DIAGNOSIS — Z79899 Other long term (current) drug therapy: Secondary | ICD-10-CM | POA: Diagnosis not present

## 2021-09-30 DIAGNOSIS — I69359 Hemiplegia and hemiparesis following cerebral infarction affecting unspecified side: Secondary | ICD-10-CM | POA: Diagnosis not present

## 2021-09-30 DIAGNOSIS — I69398 Other sequelae of cerebral infarction: Secondary | ICD-10-CM | POA: Diagnosis not present

## 2021-09-30 DIAGNOSIS — Z923 Personal history of irradiation: Secondary | ICD-10-CM | POA: Diagnosis not present

## 2021-09-30 DIAGNOSIS — G40909 Epilepsy, unspecified, not intractable, without status epilepticus: Secondary | ICD-10-CM | POA: Diagnosis not present

## 2021-09-30 DIAGNOSIS — F419 Anxiety disorder, unspecified: Secondary | ICD-10-CM | POA: Diagnosis not present

## 2021-09-30 DIAGNOSIS — Z7902 Long term (current) use of antithrombotics/antiplatelets: Secondary | ICD-10-CM | POA: Diagnosis not present

## 2021-09-30 DIAGNOSIS — I69328 Other speech and language deficits following cerebral infarction: Secondary | ICD-10-CM | POA: Diagnosis not present

## 2021-09-30 DIAGNOSIS — H903 Sensorineural hearing loss, bilateral: Secondary | ICD-10-CM | POA: Diagnosis not present

## 2021-09-30 DIAGNOSIS — Z45321 Encounter for adjustment and management of cochlear device: Secondary | ICD-10-CM | POA: Diagnosis not present

## 2021-09-30 DIAGNOSIS — Z8669 Personal history of other diseases of the nervous system and sense organs: Secondary | ICD-10-CM | POA: Diagnosis not present

## 2021-09-30 DIAGNOSIS — Z982 Presence of cerebrospinal fluid drainage device: Secondary | ICD-10-CM | POA: Diagnosis not present

## 2021-09-30 DIAGNOSIS — I493 Ventricular premature depolarization: Secondary | ICD-10-CM | POA: Diagnosis not present

## 2021-09-30 DIAGNOSIS — G4733 Obstructive sleep apnea (adult) (pediatric): Secondary | ICD-10-CM | POA: Diagnosis not present

## 2021-10-06 DIAGNOSIS — Z982 Presence of cerebrospinal fluid drainage device: Secondary | ICD-10-CM | POA: Diagnosis not present

## 2021-10-06 DIAGNOSIS — G4733 Obstructive sleep apnea (adult) (pediatric): Secondary | ICD-10-CM | POA: Diagnosis not present

## 2021-10-06 DIAGNOSIS — Z7902 Long term (current) use of antithrombotics/antiplatelets: Secondary | ICD-10-CM | POA: Diagnosis not present

## 2021-10-06 DIAGNOSIS — G40909 Epilepsy, unspecified, not intractable, without status epilepticus: Secondary | ICD-10-CM | POA: Diagnosis not present

## 2021-10-06 DIAGNOSIS — H903 Sensorineural hearing loss, bilateral: Secondary | ICD-10-CM | POA: Diagnosis not present

## 2021-10-06 DIAGNOSIS — I493 Ventricular premature depolarization: Secondary | ICD-10-CM | POA: Diagnosis not present

## 2021-10-06 DIAGNOSIS — I69359 Hemiplegia and hemiparesis following cerebral infarction affecting unspecified side: Secondary | ICD-10-CM | POA: Diagnosis not present

## 2021-10-06 DIAGNOSIS — I69328 Other speech and language deficits following cerebral infarction: Secondary | ICD-10-CM | POA: Diagnosis not present

## 2021-10-06 DIAGNOSIS — Z923 Personal history of irradiation: Secondary | ICD-10-CM | POA: Diagnosis not present

## 2021-10-06 DIAGNOSIS — Z45321 Encounter for adjustment and management of cochlear device: Secondary | ICD-10-CM | POA: Diagnosis not present

## 2021-10-06 DIAGNOSIS — Z79899 Other long term (current) drug therapy: Secondary | ICD-10-CM | POA: Diagnosis not present

## 2021-10-06 DIAGNOSIS — F419 Anxiety disorder, unspecified: Secondary | ICD-10-CM | POA: Diagnosis not present

## 2021-10-06 DIAGNOSIS — Z8669 Personal history of other diseases of the nervous system and sense organs: Secondary | ICD-10-CM | POA: Diagnosis not present

## 2021-10-06 DIAGNOSIS — I69398 Other sequelae of cerebral infarction: Secondary | ICD-10-CM | POA: Diagnosis not present

## 2021-10-07 DIAGNOSIS — H903 Sensorineural hearing loss, bilateral: Secondary | ICD-10-CM | POA: Diagnosis not present

## 2021-10-07 DIAGNOSIS — I69398 Other sequelae of cerebral infarction: Secondary | ICD-10-CM | POA: Diagnosis not present

## 2021-10-07 DIAGNOSIS — G40909 Epilepsy, unspecified, not intractable, without status epilepticus: Secondary | ICD-10-CM | POA: Diagnosis not present

## 2021-10-07 DIAGNOSIS — F419 Anxiety disorder, unspecified: Secondary | ICD-10-CM | POA: Diagnosis not present

## 2021-10-07 DIAGNOSIS — Z7902 Long term (current) use of antithrombotics/antiplatelets: Secondary | ICD-10-CM | POA: Diagnosis not present

## 2021-10-07 DIAGNOSIS — I69359 Hemiplegia and hemiparesis following cerebral infarction affecting unspecified side: Secondary | ICD-10-CM | POA: Diagnosis not present

## 2021-10-07 DIAGNOSIS — I69328 Other speech and language deficits following cerebral infarction: Secondary | ICD-10-CM | POA: Diagnosis not present

## 2021-10-07 DIAGNOSIS — Z8669 Personal history of other diseases of the nervous system and sense organs: Secondary | ICD-10-CM | POA: Diagnosis not present

## 2021-10-07 DIAGNOSIS — Z79899 Other long term (current) drug therapy: Secondary | ICD-10-CM | POA: Diagnosis not present

## 2021-10-07 DIAGNOSIS — I493 Ventricular premature depolarization: Secondary | ICD-10-CM | POA: Diagnosis not present

## 2021-10-07 DIAGNOSIS — Z45321 Encounter for adjustment and management of cochlear device: Secondary | ICD-10-CM | POA: Diagnosis not present

## 2021-10-07 DIAGNOSIS — Z982 Presence of cerebrospinal fluid drainage device: Secondary | ICD-10-CM | POA: Diagnosis not present

## 2021-10-07 DIAGNOSIS — Z923 Personal history of irradiation: Secondary | ICD-10-CM | POA: Diagnosis not present

## 2021-10-07 DIAGNOSIS — G4733 Obstructive sleep apnea (adult) (pediatric): Secondary | ICD-10-CM | POA: Diagnosis not present

## 2021-10-15 DIAGNOSIS — Z9621 Cochlear implant status: Secondary | ICD-10-CM | POA: Diagnosis not present

## 2021-10-15 DIAGNOSIS — H903 Sensorineural hearing loss, bilateral: Secondary | ICD-10-CM | POA: Diagnosis not present

## 2021-10-21 DIAGNOSIS — F4010 Social phobia, unspecified: Secondary | ICD-10-CM | POA: Diagnosis not present

## 2021-10-21 DIAGNOSIS — F411 Generalized anxiety disorder: Secondary | ICD-10-CM | POA: Diagnosis not present

## 2021-10-21 DIAGNOSIS — F39 Unspecified mood [affective] disorder: Secondary | ICD-10-CM | POA: Diagnosis not present

## 2021-11-12 DIAGNOSIS — Z9621 Cochlear implant status: Secondary | ICD-10-CM | POA: Diagnosis not present

## 2021-11-12 DIAGNOSIS — H903 Sensorineural hearing loss, bilateral: Secondary | ICD-10-CM | POA: Diagnosis not present

## 2021-11-17 DIAGNOSIS — Z982 Presence of cerebrospinal fluid drainage device: Secondary | ICD-10-CM | POA: Insufficient documentation

## 2021-11-18 DIAGNOSIS — F4010 Social phobia, unspecified: Secondary | ICD-10-CM | POA: Diagnosis not present

## 2021-11-18 DIAGNOSIS — F39 Unspecified mood [affective] disorder: Secondary | ICD-10-CM | POA: Diagnosis not present

## 2021-11-18 DIAGNOSIS — F411 Generalized anxiety disorder: Secondary | ICD-10-CM | POA: Diagnosis not present

## 2021-11-26 DIAGNOSIS — Z4889 Encounter for other specified surgical aftercare: Secondary | ICD-10-CM | POA: Diagnosis not present

## 2021-11-26 DIAGNOSIS — H6192 Disorder of left external ear, unspecified: Secondary | ICD-10-CM | POA: Insufficient documentation

## 2021-11-26 DIAGNOSIS — Z7961 Long term (current) use of immunomodulator: Secondary | ICD-10-CM | POA: Diagnosis not present

## 2021-11-26 DIAGNOSIS — C719 Malignant neoplasm of brain, unspecified: Secondary | ICD-10-CM | POA: Diagnosis not present

## 2021-11-26 DIAGNOSIS — H903 Sensorineural hearing loss, bilateral: Secondary | ICD-10-CM | POA: Diagnosis not present

## 2021-11-26 DIAGNOSIS — Z9621 Cochlear implant status: Secondary | ICD-10-CM | POA: Diagnosis not present

## 2021-11-26 DIAGNOSIS — Z982 Presence of cerebrospinal fluid drainage device: Secondary | ICD-10-CM | POA: Diagnosis not present

## 2021-11-26 DIAGNOSIS — Z8673 Personal history of transient ischemic attack (TIA), and cerebral infarction without residual deficits: Secondary | ICD-10-CM | POA: Diagnosis not present

## 2021-12-01 ENCOUNTER — Ambulatory Visit: Payer: Medicare Other | Admitting: Cardiology

## 2021-12-02 ENCOUNTER — Ambulatory Visit: Payer: Medicare Other | Admitting: Neurology

## 2021-12-02 ENCOUNTER — Encounter: Payer: Self-pay | Admitting: Neurology

## 2021-12-02 VITALS — BP 110/73 | HR 65 | Ht 68.0 in | Wt 166.0 lb

## 2021-12-02 DIAGNOSIS — C719 Malignant neoplasm of brain, unspecified: Secondary | ICD-10-CM | POA: Diagnosis not present

## 2021-12-02 DIAGNOSIS — R569 Unspecified convulsions: Secondary | ICD-10-CM

## 2021-12-02 MED ORDER — TOPIRAMATE 50 MG PO TABS: ORAL_TABLET | ORAL | 3 refills | Status: DC

## 2021-12-02 NOTE — Progress Notes (Signed)
PATIENT: [name] Love DOB: 12-25-1970  REASON FOR VISIT: Follow up for ependymoma, seizures   HISTORY FROM: Patient PRIMARY NEUROLOGIST: Dr. Krista Blue   HISTORY OF PRESENT ILLNESS: Today 12/02/21 [name] Love here today for follow-up, history of ependymoma with seizures, s/p resection.  Last seizure May 2022 in the setting of missed doses of Topamax.  MRI of the brain in May 2022 showed some concern of recurrence of brain tumor. Saw Dr. Christella Noa, it was felt he did not have recurrence of brain tumor.  He has a VP shunt. No seizures since last seen. HH PT ordered last visit, they never came out, he doesn't think it would help. Has chronic gait disorder. Lives alone in apartment, he has a Neurosurgeon. Has bilateral cochlear implants. Has cane and rollator if needed. He drives in limited capacity.   HISTORY  06/01/2021 Dr. Jannifer Franklin: [name] Love is a 51 year old right-handed white male with a history of an ependymoma, status post resection and radiation in the past.  The patient has a history of seizures.  He recently had a seizure on 27 Feb 2021 when he missed several doses of Topamax.  The patient has done well since that time.  He reports a 52-month history of some worsening gait problems, he may stumble or fall on occasion.  He had MRI done in May 2022 and there was some concern over a recurrence of his brain tumor or stroke.  He had a MRI done on 07 May 2021 with and without contrast, no evidence of recurrent brain tumor or stroke was noted.  The patient was seen by Dr. Christella Noa who agreed that he did not have recurrence of brain tumor.  The patient has a VP shunt in place that is managed by Dr. Christella Noa.  The patient does operate a motor vehicle but currently cannot drive because of the recent seizure.  Uses a walker at times inside the house.  He is considering a cochlear implant on the right side in the future.  He returns here for an evaluation.  He does have a history of a small right cerebellar stroke in 2020,  he remains on Plavix.  REVIEW OF SYSTEMS: Out of a complete 14 system review of symptoms, the patient complains only of the following symptoms, and all other reviewed systems are negative.  See HPI  ALLERGIES: Allergies  Allergen Reactions   Baclofen     Urinary retension   Seroquel [Quetiapine] Other (See Comments)    Weight gain   Sulfa Antibiotics Other (See Comments)    Reaction ??   Coconut Flavor Rash and Other (See Comments)    ANYTHING COCONUT- allergic to this   Contrast Media [Iodinated Contrast Media] Rash    HOME MEDICATIONS: Outpatient Medications Prior to Visit  Medication Sig Dispense Refill   ALPRAZolam (XANAX) 0.5 MG tablet Take 0.5 mg by mouth 2 (two) times daily.  2   clopidogrel (PLAVIX) 75 MG tablet TAKE 1 TABLET(75 MG) BY MOUTH DAILY 90 tablet 1   metoprolol succinate (TOPROL-XL) 50 MG 24 hr tablet TAKE 1 TABLET(50 MG) BY MOUTH DAILY WITH OR IMMEDIATELY FOLLOWING A MEAL 90 tablet 2   topiramate (TOPAMAX) 50 MG tablet Take 50mg  every am and 100mg  every evening 270 tablet 3   No facility-administered medications prior to visit.    PAST MEDICAL HISTORY: Past Medical History:  Diagnosis Date   Abnormal gait    Acute arterial ischemic stroke, vertebrobasilar, thalamic ([place]) 06/08/2018   Acute CVA (cerebrovascular accident) (  [place]) 06/07/2018   Allergic rhinitis    Anxiety    Arthritis 11/05/2013   Atypical chest pain 11/30/2017   Bilateral arm weakness    Bilateral hearing loss 07/28/2020   Brain cancer ([place])    Cerebral thrombosis with cerebral infarction 04/17/2019   Cochlear implant in place with multiple channels 05/15/2020   CVA (cerebrovascular accident) ([place]) 04/18/2019   Depression    Diplopia    Dyspnea on exertion 11/30/2017   Ependymoma of brain ([place])    Episode of change in speech 04/17/2019   Generalized anxiety disorder 11/28/2018   Headache 11/05/2013   Hearing loss, sensorineural    Hemiparesis and alteration of sensations as late effects of  stroke ([place]) 09/04/2018   History of stroke 04/01/2020   Hypothyroidism    Insomnia    Left leg weakness    Low vitamin B12 level    Memory loss 11/05/2013   Myalgia 04/18/2017   OSA on CPAP 11/28/2018   Recurrent falls    Refusal of blood transfusions as patient is Jehovah's Witness 04/01/2020   Seizure ([place])    Seizures ([place])    Sleeping difficulty 11/05/2013   Stroke-like symptoms    Ventricular ectopy 06/04/2020   Weakness 04/16/2019   Weakness generalized 04/17/2019    PAST SURGICAL HISTORY: Past Surgical History:  Procedure Laterality Date   BRAIN SURGERY     HERNIA REPAIR     SHUNT REVISION      FAMILY HISTORY: Family History  Problem Relation Age of Onset   Brain cancer Mother    High blood pressure Mother     SOCIAL HISTORY: Social History   Socioeconomic History   Marital status: Single    Spouse name: Not on file   Number of children: 0   Years of education: 12   Highest education level: Not on file  Occupational History   Occupation: Disabled  Tobacco Use   Smoking status: Never   Smokeless tobacco: Never  Vaping Use   Vaping Use: Never used  Substance and Sexual Activity   Alcohol use: No   Drug use: No   Sexual activity: Yes    Birth control/protection: None  Other Topics Concern   Not on file  Social History Narrative   Lives with Aunt [name])   Caffeine use: No coffee   Soda- trying to quit      Right-handed   Social Determinants of Health   Financial Resource Strain: Not on file  Food Insecurity: Not on file  Transportation Needs: Not on file  Physical Activity: Not on file  Stress: Not on file  Social Connections: Not on file  Intimate Partner Violence: Not on file   PHYSICAL EXAM  Vitals:   12/02/21 1401  BP: 110/73  Pulse: 65  Weight: 166 lb (75.3 kg)  Height: 5\' 8"  (1.727 m)   Body mass index is 25.24 kg/m.  Generalized: Well developed, in no acute distress  Neurological examination  Mentation: Alert oriented to  time, place, history taking. Follows all commands speech and language fluent Cranial nerve II-XII: Pupils were equal round reactive to light. Extraocular movements were full, visual field were full on confrontational test. Facial sensation and strength were normal. Head turning and shoulder shrug  were normal and symmetric. Motor: The motor testing reveals 5 over 5 strength of all 4 extremities. Good symmetric motor tone is noted throughout.  Sensory: Sensory testing is intact to soft touch on all 4 extremities. No evidence of extinction is  noted.  Coordination: Cerebellar testing reveals good finger-nose-finger and heel-to-shin bilaterally.  Gait and station: Gait is wide-based, cautious, uses single-point cane, turning in the halls, he braces against the wall Reflexes: Deep tendon reflexes are symmetric and normal bilaterally.   DIAGNOSTIC DATA (LABS, IMAGING, TESTING) - I reviewed patient records, labs, notes, testing and imaging myself where available.  Lab Results  Component Value Date   WBC 6.1 02/27/2021   HGB 14.8 02/27/2021   HCT 44.4 02/27/2021   MCV 91.9 02/27/2021   PLT 201 02/27/2021      Component Value Date/Time   NA 141 02/27/2021 1813   NA 142 08/23/2017 1059   NA 142 11/06/2014 1751   K 3.0 (L) 02/27/2021 1813   K 3.7 11/06/2014 1751   CL 113 (H) 02/27/2021 1813   CL 106 11/06/2014 1751   CO2 23 02/27/2021 1813   CO2 29 11/06/2014 1751   GLUCOSE 88 02/27/2021 1813   GLUCOSE 91 11/06/2014 1751   BUN 6 02/27/2021 1813   BUN 17 08/23/2017 1059   BUN 14 11/06/2014 1751   CREATININE 1.29 (H) 02/27/2021 1813   CREATININE 1.11 11/06/2014 1751   CALCIUM 8.3 (L) 02/27/2021 1813   CALCIUM 8.8 11/06/2014 1751   PROT 5.7 (L) 02/27/2021 1813   PROT 6.5 08/23/2017 1059   PROT 6.9 11/06/2014 1751   ALBUMIN 3.5 02/27/2021 1813   ALBUMIN 4.5 08/23/2017 1059   ALBUMIN 3.8 11/06/2014 1751   AST 13 (L) 02/27/2021 1813   AST 33 11/06/2014 1751   ALT 11 02/27/2021 1813    ALT 55 11/06/2014 1751   ALKPHOS 71 02/27/2021 1813   ALKPHOS 89 11/06/2014 1751   BILITOT 0.7 02/27/2021 1813   BILITOT <0.2 08/23/2017 1059   BILITOT 0.4 11/06/2014 1751   GFRNONAA >60 02/27/2021 1813   GFRNONAA >60 11/06/2014 1751   GFRNONAA >60 03/12/2014 1140   GFRAA >60 04/17/2019 0810   GFRAA >60 11/06/2014 1751   GFRAA >60 03/12/2014 1140   Lab Results  Component Value Date   CHOL 119 04/17/2019   HDL 34 (L) 04/17/2019   LDLCALC 64 04/17/2019   TRIG 104 04/17/2019   CHOLHDL 3.5 04/17/2019   Lab Results  Component Value Date   HGBA1C 5.3 04/17/2019   Lab Results  Component Value Date   VITAMINB12 238 04/17/2019   Lab Results  Component Value Date   TSH 4.294 04/17/2019   ASSESSMENT AND PLAN 51 y.o. year old male   1.  Gait disorder 2.  History of ependymoma 3.  History of seizures  -[name] Love is doing overall well  -Last seizure was in May 2022 in setting of Topamax missed dosages, possible Luvox -Continue Topamax 50/100 daily for seizure prevention  -Recent MRI brain initially questioned return of tumor, but a repeat in August 2022 showed no evidence of tumor, Dr. Jannifer Franklin and Dr. Christella Noa agreed with this -VP shunt managed by Dr. Christella Noa -He is to be careful not to fall, didn't think PT would be helpful -Will be followed by Dr. Krista Blue since Dr. Jannifer Franklin has retired  [name] Love, AGNP-C, [place] 12/02/2021, 2:08 PM [facility] [address], [place], [place] [address] [phone]

## 2021-12-02 NOTE — Progress Notes (Signed)
Chart reviewed, agree above plan ?

## 2021-12-02 NOTE — Patient Instructions (Signed)
Continue Topamax, make sure not to miss any doses ?Call for any seizures  ?See you back in 8 months ?

## 2021-12-14 DIAGNOSIS — F4010 Social phobia, unspecified: Secondary | ICD-10-CM | POA: Diagnosis not present

## 2021-12-14 DIAGNOSIS — F39 Unspecified mood [affective] disorder: Secondary | ICD-10-CM | POA: Diagnosis not present

## 2021-12-14 DIAGNOSIS — F411 Generalized anxiety disorder: Secondary | ICD-10-CM | POA: Diagnosis not present

## 2021-12-17 DIAGNOSIS — Z45321 Encounter for adjustment and management of cochlear device: Secondary | ICD-10-CM | POA: Diagnosis not present

## 2021-12-17 DIAGNOSIS — H903 Sensorineural hearing loss, bilateral: Secondary | ICD-10-CM | POA: Diagnosis not present

## 2022-01-07 ENCOUNTER — Ambulatory Visit: Payer: Medicare Other | Admitting: Cardiology

## 2022-01-07 ENCOUNTER — Encounter: Payer: Self-pay | Admitting: Cardiology

## 2022-01-07 VITALS — BP 100/60 | HR 73 | Ht 68.0 in | Wt 168.4 lb

## 2022-01-07 DIAGNOSIS — I493 Ventricular premature depolarization: Secondary | ICD-10-CM

## 2022-01-07 DIAGNOSIS — G4733 Obstructive sleep apnea (adult) (pediatric): Secondary | ICD-10-CM | POA: Diagnosis not present

## 2022-01-07 DIAGNOSIS — C719 Malignant neoplasm of brain, unspecified: Secondary | ICD-10-CM

## 2022-01-07 DIAGNOSIS — I4729 Other ventricular tachycardia: Secondary | ICD-10-CM

## 2022-01-07 DIAGNOSIS — Z9989 Dependence on other enabling machines and devices: Secondary | ICD-10-CM

## 2022-01-07 NOTE — Patient Instructions (Signed)
Medication Instructions:  ?Your physician recommends that you continue on your current medications as directed. Please refer to the Current Medication list given to you today. ? ?*If you need a refill on your cardiac medications before your next appointment, please call your pharmacy* ? ? ?Lab Work: ?None ?If you have labs (blood work) drawn today and your tests are completely normal, you will receive your results only by: ?MyChart Message (if you have MyChart) OR ?A paper copy in the mail ?If you have any lab test that is abnormal or we need to change your treatment, we will call you to review the results. ? ? ?Testing/Procedures: ?None ? ? ?Follow-Up: ?At [facility], you and your health needs are our priority.  As part of our continuing mission to provide you with exceptional heart care, we have created designated Provider Care Teams.  These Care Teams include your primary Cardiologist (physician) and Advanced Practice Providers (APPs -  Physician Assistants and Nurse Practitioners) who all work together to provide you with the care you need, when you need it. ? ?We recommend signing up for the patient portal called "MyChart".  Sign up information is provided on this After Visit Summary.  MyChart is used to connect with patients for Virtual Visits (Telemedicine).  Patients are able to view lab/test results, encounter notes, upcoming appointments, etc.  Non-urgent messages can be sent to your provider as well.   ?To learn more about what you can do with MyChart, go to [email or web address].   ? ?Your next appointment:   ?1 year(s) ? ?The format for your next appointment:   ?In Person ? ?Provider:   ?Jenne Campus, MD  ? ? ?Other Instructions ?None ? ?

## 2022-01-07 NOTE — Progress Notes (Signed)
?Cardiology Office Note:   ? ?Date:  01/07/2022  ? ?ID:  [name] Love, DOB 1970-12-04, MRN [id number] ? ?PCP:  Leonides Sake, MD  ?Cardiologist:  Jenne Campus, MD   ? ?Referring MD: Leonides Sake, MD  ? ?Chief Complaint  ?Patient presents with  ? Follow-up  ?   ?  ? ? ?History of Present Illness:   ? ?[name] Love is a 51 y.o. male  with complex past medical history which include brain cancer, history of CVA, I been following him for frequent ventricular ectopy.  Monitor showed total burden of 13.3% as well as 1 run of nonsustained ventricular tachycardia.  8 beats.  Rate was 255 bpm.  At that time he was completely asymptomatic.  Echocardiogram being done which showed preserved left ventricle ejection fraction and then eventually we end up treating this with small dose of beta-blocker.   ?He comes today to my office for follow-up.  Overall he seems to be doing well.  He described to have further episode of seizures however there is only mild as he called me his neurologist is not already about it.  There were no changes in his medications.  We had a concern that possibly seizures were related to [place] however he did wear a monitor which did not show any significant arrhythmia.  Cardiac wise doing well denies having palpitations there is no chest pain tightness squeezing pressure mid chest no palpitations dizziness swelling of lower extremities.  He changed his diet he lost significant amount of weight and feeling good ? ?Past Medical History:  ?Diagnosis Date  ? Abnormal gait   ? Acute arterial ischemic stroke, vertebrobasilar, thalamic ([place]) 06/08/2018  ? Acute CVA (cerebrovascular accident) ([place]) 06/07/2018  ? Allergic rhinitis   ? Anxiety   ? Arthritis 11/05/2013  ? Atypical chest pain 11/30/2017  ? Bilateral arm weakness   ? Bilateral hearing loss 07/28/2020  ? Brain cancer [facility])   ? Cerebral thrombosis with cerebral infarction 04/17/2019  ? Cochlear implant in place with multiple channels 05/15/2020  ? CVA  (cerebrovascular accident) ([place]) 04/18/2019  ? Depression   ? Diplopia   ? Dyspnea on exertion 11/30/2017  ? Ependymoma of brain [facility])   ? Episode of change in speech 04/17/2019  ? Generalized anxiety disorder 11/28/2018  ? Headache 11/05/2013  ? Hearing loss, sensorineural   ? Hemiparesis and alteration of sensations as late effects of stroke ([place]) 09/04/2018  ? History of stroke 04/01/2020  ? Hypothyroidism   ? Insomnia   ? Left leg weakness   ? Low vitamin B12 level   ? Memory loss 11/05/2013  ? Myalgia 04/18/2017  ? OSA on CPAP 11/28/2018  ? Recurrent falls   ? Refusal of blood transfusions as patient is Jehovah's Witness 04/01/2020  ? Seizure ([place])   ? Seizures ([place])   ? Sleeping difficulty 11/05/2013  ? Stroke-like symptoms   ? Ventricular ectopy 06/04/2020  ? Weakness 04/16/2019  ? Weakness generalized 04/17/2019  ? ? ?Past Surgical History:  ?Procedure Laterality Date  ? BRAIN SURGERY    ? HERNIA REPAIR    ? SHUNT REVISION    ? ? ?Current Medications: ?Current Meds  ?Medication Sig  ? ALPRAZolam (XANAX) 0.5 MG tablet Take 0.5 mg by mouth 2 (two) times daily.  ? clopidogrel (PLAVIX) 75 MG tablet TAKE 1 TABLET(75 MG) BY MOUTH DAILY (Patient taking differently: Take 75 mg by mouth daily.)  ? metoprolol succinate (TOPROL-XL) 50 MG 24 hr tablet TAKE  1 TABLET(50 MG) BY MOUTH DAILY WITH OR IMMEDIATELY FOLLOWING A MEAL (Patient taking differently: Take 50 mg by mouth daily.)  ? topiramate (TOPAMAX) 50 MG tablet Take '50mg'$  every am and '100mg'$  every evening (Patient taking differently: Take 50-100 mg by mouth See admin instructions. Take '50mg'$  every am and '100mg'$  every evening)  ?  ? ?Allergies:   Baclofen, Seroquel [quetiapine], Sulfa antibiotics, Coconut flavor, and Contrast media [iodinated contrast media]  ? ?Social History  ? ?Socioeconomic History  ? Marital status: Single  ?  Spouse name: Not on file  ? Number of children: 0  ? Years of education: 73  ? Highest education level: Not on file  ?Occupational History  ? Occupation:  Disabled  ?Tobacco Use  ? Smoking status: Never  ? Smokeless tobacco: Never  ?Vaping Use  ? Vaping Use: Never used  ?Substance and Sexual Activity  ? Alcohol use: No  ? Drug use: No  ? Sexual activity: Yes  ?  Birth control/protection: None  ?Other Topics Concern  ? Not on file  ?Social History Narrative  ? Lives with Aunt [name])  ? Caffeine use: No coffee  ? Soda- trying to quit  ?   ? Right-handed  ? ?Social Determinants of Health  ? ?Financial Resource Strain: Not on file  ?Food Insecurity: Not on file  ?Transportation Needs: Not on file  ?Physical Activity: Not on file  ?Stress: Not on file  ?Social Connections: Not on file  ?  ? ?Family History: ?The patient's family history includes Brain cancer in his mother; High blood pressure in his mother. ?ROS:   ?Please see the history of present illness.    ?All 14 point review of systems negative except as described per history of present illness ? ?EKGs/Labs/Other Studies Reviewed:   ? ? ? ?Recent Labs: ?02/27/2021: ALT 11; BUN 6; Creatinine, Ser 1.29; Hemoglobin 14.8; Platelets 201; Potassium 3.0; Sodium 141  ?Recent Lipid Panel ?   ?Component Value Date/Time  ? CHOL 119 04/17/2019 0810  ? TRIG 104 04/17/2019 0810  ? HDL 34 (L) 04/17/2019 0810  ? CHOLHDL 3.5 04/17/2019 0810  ? VLDL 21 04/17/2019 0810  ? [place] 64 04/17/2019 0810  ? ? ?Physical Exam:   ? ?VS:  BP 100/60 (BP Location: Left Arm, Patient Position: Sitting)   Pulse 73   Ht '5\' 8"'$  (1.727 m)   Wt 168 lb 6.4 oz (76.4 kg)   SpO2 98%   BMI 25.61 kg/m?    ? ?Wt Readings from Last 3 Encounters:  ?01/07/22 168 lb 6.4 oz (76.4 kg)  ?12/02/21 166 lb (75.3 kg)  ?06/01/21 179 lb 3.2 oz (81.3 kg)  ?  ? ?GEN:  Well nourished, well developed in no acute distress ?HEENT: Normal ?NECK: No JVD; No carotid bruits ?LYMPHATICS: No lymphadenopathy ?CARDIAC: RRR, no murmurs, no rubs, no gallops ?RESPIRATORY:  Clear to auscultation without rales, wheezing or rhonchi  ?ABDOMEN: Soft, non-tender,  non-distended ?MUSCULOSKELETAL:  No edema; No deformity  ?SKIN: Warm and dry ?LOWER EXTREMITIES: no swelling ?NEUROLOGIC:  Alert and oriented x 3 ?PSYCHIATRIC:  Normal affect  ? ?ASSESSMENT:   ? ?1. Ventricular ectopy   ?2. Nonsustained ventricular tachycardia ([place])   ?3. OSA on CPAP   ?4. Malignant neoplasm of brain, unspecified location [facility])   ? ?PLAN:   ? ?In order of problems listed above: ? ?Ventricular ectopy.  Not bilaterally some, he is on Toprol XL 50 which I will continue.  Ejection fraction was normal. ?He  is stiff nonsustained ventricular tachycardia but denies having any palpitations.  We will continue monitoring. ?Obstructive sleep apnea: That being addressed by internal medicine team ?History of malignant brain cancer.  Stable ? ? ?Medication Adjustments/Labs and Tests Ordered: ?Current medicines are reviewed at length with the patient today.  Concerns regarding medicines are outlined above.  ?No orders of the defined types were placed in this encounter. ? ?Medication changes: No orders of the defined types were placed in this encounter. ? ? ?Signed, ?Park Liter, MD, [facility] ?01/07/2022 2:09 PM    ?[place] ?

## 2022-01-12 ENCOUNTER — Telehealth: Payer: Self-pay

## 2022-01-12 NOTE — Telephone Encounter (Signed)
? ?  Primary Cardiologist: Jenne Campus, MD ? ?Chart reviewed as part of pre-operative protocol coverage. Given past medical history and time since last visit, based on ACC/AHA guidelines, [name] Love would be at acceptable risk for the planned procedure without further cardiovascular testing.  ? ?There is no cardiac indication for Plavix, it was a neurological indication. Therefore, we would recommend you get permission to hold Plavix from patient's neurologist.  ? ?I will route this recommendation to the requesting party via Epic fax function and remove from pre-op pool. ? ?Please call with questions. ? ?Emmaline Life, NP-C ? ?  ?01/12/2022, 4:50 PM ?[place] ?[address] ?Office [phone] Fax [phone] ? ?

## 2022-01-12 NOTE — Telephone Encounter (Signed)
? ?  [facility] Pre-operative Risk Assessment  ?  ?Request for surgical clearance: ? ?What type of surgery is being performed? Extractions tooth#12, filling tooth#13,6,11,10,8 and 28, possible bridging tooth#11,12 and 13. ? ?When is this surgery scheduled? TBD  ? ?What type of clearance is required (medical clearance vs. Pharmacy clearance to hold med vs. Both)? Pharmacy ? ?Are there any medications that need to be held prior to surgery and how long?Plavix, holding length not specified   ? ?Practice name and name of physician performing surgery? Dr. Andrez Grime at [facility]  ? ?What is your office phone number: [phone]  ?  ?7.   What is your office fax number: [phone] ? ?8.   Anesthesia type (None, local, MAC, general) ? Not specified  ? ? ?Eli Adami M Clayvon Parlett ?01/12/2022, 9:16 AM  ?_________________________________________________________________ ?  (provider comments below) ? ?

## 2022-01-12 NOTE — Telephone Encounter (Signed)
We have received request for clearance for tooth extractions for patient to hold Plavix for [facility] time.  I do not see a cardiac indication for Plavix, however the Rx has been ordered under Dr. Wendy Poet name. Please indicate whether you would like to provide clearance for patient to hold Plavix or have your staff contact the appropriate provider to have the prescription changed. I will await commenting on the request for dental clearance until this is clarified.  ?

## 2022-01-12 NOTE — Telephone Encounter (Signed)
Routed Dr.Krasowski's response to Christen Bame, NP ?

## 2022-01-14 DIAGNOSIS — F4010 Social phobia, unspecified: Secondary | ICD-10-CM | POA: Diagnosis not present

## 2022-01-14 DIAGNOSIS — F39 Unspecified mood [affective] disorder: Secondary | ICD-10-CM | POA: Diagnosis not present

## 2022-01-14 DIAGNOSIS — F411 Generalized anxiety disorder: Secondary | ICD-10-CM | POA: Diagnosis not present

## 2022-02-10 ENCOUNTER — Ambulatory Visit: Payer: Medicare Other | Admitting: Neurology

## 2022-03-18 ENCOUNTER — Other Ambulatory Visit: Payer: Self-pay | Admitting: Cardiology

## 2022-04-05 NOTE — Telephone Encounter (Signed)
Patient calling to find out if he needs to hold his plavix. He states he is getting conflicting information from different offices.

## 2022-04-08 DIAGNOSIS — F4010 Social phobia, unspecified: Secondary | ICD-10-CM | POA: Diagnosis not present

## 2022-04-08 DIAGNOSIS — F39 Unspecified mood [affective] disorder: Secondary | ICD-10-CM | POA: Diagnosis not present

## 2022-04-08 DIAGNOSIS — F411 Generalized anxiety disorder: Secondary | ICD-10-CM | POA: Diagnosis not present

## 2022-04-13 ENCOUNTER — Telehealth: Payer: Self-pay | Admitting: Cardiology

## 2022-04-13 NOTE — Telephone Encounter (Signed)
Per Dr. Raliegh Ip is recommending the dentist decided if Plavix need to be hold and for how long. I also stated  to the patient he can have his dentist send a clearance request to sent to our pre op team. Patient understood.

## 2022-04-13 NOTE — Telephone Encounter (Signed)
Patient called stating he is having a dental cleaning, fillings and a tooth extracted.  He wants to know if he needs to be off of his Plavix and for how long.

## 2022-04-30 ENCOUNTER — Other Ambulatory Visit: Payer: Self-pay | Admitting: Cardiology

## 2022-05-05 DIAGNOSIS — F4010 Social phobia, unspecified: Secondary | ICD-10-CM | POA: Diagnosis not present

## 2022-05-05 DIAGNOSIS — F39 Unspecified mood [affective] disorder: Secondary | ICD-10-CM | POA: Diagnosis not present

## 2022-05-05 DIAGNOSIS — F411 Generalized anxiety disorder: Secondary | ICD-10-CM | POA: Diagnosis not present

## 2022-05-21 DIAGNOSIS — H6123 Impacted cerumen, bilateral: Secondary | ICD-10-CM | POA: Diagnosis not present

## 2022-05-21 DIAGNOSIS — H6041 Cholesteatoma of right external ear: Secondary | ICD-10-CM | POA: Insufficient documentation

## 2022-05-21 DIAGNOSIS — H7191 Unspecified cholesteatoma, right ear: Secondary | ICD-10-CM | POA: Diagnosis not present

## 2022-05-21 DIAGNOSIS — Z85841 Personal history of malignant neoplasm of brain: Secondary | ICD-10-CM | POA: Diagnosis not present

## 2022-05-21 DIAGNOSIS — Z4889 Encounter for other specified surgical aftercare: Secondary | ICD-10-CM | POA: Diagnosis not present

## 2022-05-21 DIAGNOSIS — H903 Sensorineural hearing loss, bilateral: Secondary | ICD-10-CM | POA: Diagnosis not present

## 2022-05-21 DIAGNOSIS — H6092 Unspecified otitis externa, left ear: Secondary | ICD-10-CM | POA: Diagnosis not present

## 2022-05-21 DIAGNOSIS — H9203 Otalgia, bilateral: Secondary | ICD-10-CM | POA: Insufficient documentation

## 2022-05-21 DIAGNOSIS — Z982 Presence of cerebrospinal fluid drainage device: Secondary | ICD-10-CM | POA: Diagnosis not present

## 2022-05-21 DIAGNOSIS — Z45321 Encounter for adjustment and management of cochlear device: Secondary | ICD-10-CM | POA: Diagnosis not present

## 2022-05-21 DIAGNOSIS — Z923 Personal history of irradiation: Secondary | ICD-10-CM | POA: Diagnosis not present

## 2022-05-21 DIAGNOSIS — Z9621 Cochlear implant status: Secondary | ICD-10-CM | POA: Diagnosis not present

## 2022-05-27 ENCOUNTER — Other Ambulatory Visit: Payer: Self-pay

## 2022-05-27 NOTE — Patient Outreach (Signed)
  Care Coordination   05/27/2022 Name: [name] Love MRN: [id number] DOB: June 07, 1971   Care Coordination Outreach Attempts:  An unsuccessful telephone outreach was attempted today to offer the patient information about available care coordination services as a benefit of their health plan.   Follow Up Plan:  Additional outreach attempts will be made to offer the patient care coordination information and services.   Encounter Outcome:  No Answer  Care Coordination Interventions Activated:  No   Care Coordination Interventions:  No, not indicated    Tomasa Rand, RN, BSN, CEN [facility] [employer] [phone]

## 2022-05-27 NOTE — Patient Outreach (Signed)
  Care Coordination   Outreach  Visit Note   05/27/2022 Name: [name] Love MRN: [id number] DOB: 07-Jan-1971  [name] Love is a 51 y.o. year old male who sees Hamrick, Lorin Mercy, MD for primary care. I spoke with  Lois Huxley by phone today  What matters to the patients health and wellness today?  Patient returned call. Reviewed [facility] care coordination program.  Explained program. Patient reports that he is doing well right now. Reports that he has all his medications, transportation. Reports he is self managing his health well. Refused services at this time.      SDOH assessments and interventions completed:  Yes  SDOH Interventions Today    Flowsheet Row Most Recent Value  SDOH Interventions   Transportation Interventions Intervention Not Indicated        Care Coordination Interventions Activated:  No  Care Coordination Interventions:  No, not indicated   Follow up plan: No further intervention required.   Encounter Outcome:  Pt. Refused   Tomasa Rand, RN, BSN, CEN [facility] [employer] [phone]

## 2022-07-27 DIAGNOSIS — F411 Generalized anxiety disorder: Secondary | ICD-10-CM | POA: Diagnosis not present

## 2022-07-27 DIAGNOSIS — F4010 Social phobia, unspecified: Secondary | ICD-10-CM | POA: Diagnosis not present

## 2022-07-27 DIAGNOSIS — F39 Unspecified mood [affective] disorder: Secondary | ICD-10-CM | POA: Diagnosis not present

## 2022-08-12 ENCOUNTER — Ambulatory Visit: Payer: Medicare Other | Admitting: Neurology

## 2022-09-02 DIAGNOSIS — F411 Generalized anxiety disorder: Secondary | ICD-10-CM | POA: Diagnosis not present

## 2022-09-02 DIAGNOSIS — F39 Unspecified mood [affective] disorder: Secondary | ICD-10-CM | POA: Diagnosis not present

## 2022-09-02 DIAGNOSIS — F4010 Social phobia, unspecified: Secondary | ICD-10-CM | POA: Diagnosis not present

## 2022-10-08 DIAGNOSIS — K080 Exfoliation of teeth due to systemic causes: Secondary | ICD-10-CM | POA: Diagnosis not present

## 2022-10-22 DIAGNOSIS — K080 Exfoliation of teeth due to systemic causes: Secondary | ICD-10-CM | POA: Diagnosis not present

## 2022-10-25 ENCOUNTER — Ambulatory Visit: Payer: Medicare Other | Admitting: Neurology

## 2022-10-25 ENCOUNTER — Encounter: Payer: Self-pay | Admitting: Neurology

## 2022-10-25 VITALS — BP 111/77 | HR 72 | Ht 68.0 in | Wt 178.0 lb

## 2022-10-25 DIAGNOSIS — R269 Unspecified abnormalities of gait and mobility: Secondary | ICD-10-CM

## 2022-10-25 DIAGNOSIS — C719 Malignant neoplasm of brain, unspecified: Secondary | ICD-10-CM

## 2022-10-25 DIAGNOSIS — R569 Unspecified convulsions: Secondary | ICD-10-CM | POA: Diagnosis not present

## 2022-10-25 MED ORDER — DIVALPROEX SODIUM ER 500 MG PO TB24: 1000.0000 mg | ORAL_TABLET | Freq: Every day | ORAL | 11 refills | Status: DC

## 2022-10-25 NOTE — Progress Notes (Signed)
Chief Complaint  Patient presents with   Follow-up    Rm 12. Alone. Pt c/o a few seizures. He has had six seizures since last visit, once transported by EMS. He says he is compliant with medication.      ASSESSMENT AND PLAN  [name] Love is a 52 y.o. male   History of posterior fossa ependymoma  Multiple resection surgery in the past, status post whole brain radiation Reported history of stroke Gait abnormalities Seizure  Multiple recurrent falling/passing out spells, syncope versus recurrent seizure  Repeat EEG  Stop Topamax  Depakote ER 500 mg 2 tablets every day  Laboratory evaluations  I discussed with his power of attorney April Stevens for home physical therapy and social worker consultation, she will try other resource first,  Also advised patient and his POA, he should not drive until episode free for 6 months  Return To Clinic With NP In 6 Months   DIAGNOSTIC DATA (LABS, IMAGING, TESTING) - I reviewed patient records, labs, notes, testing and imaging myself where available.   MEDICAL HISTORY:  [name] Love, is a 52 year old male, patient of Dr. Jannifer Franklin, following up for seizure, his primary care physician is Dr. Lisbeth Ply, Lorin Mercy he drove himself to clinic today on October 25, 2022  I reviewed and summarized the referring note. PMHX. Ependymoma s/p resection, radiation therapy, in [place].  He was seen by Dr. Jannifer Franklin since 01/21/17, previous was under the care of neurologist at [facility], he used to live with his aunt, who passed away, his cousin 01/22/2023 [name] [phone] is his power of attorney, who live close to him, I was able to talk with 01-22-2023, reported that patient complains of falling down passing out spells, but she has not witnessed any recent spells, she also reported that Athel is not eating healthy balanced diet, most of the 14 episode happened when he was bending over, she is suspicious there might be dehydration/malnutrition  component, patient also has a lot of financial strain, rely on family to provide help,  He lives independent now, with his service dog, drove to clinic today,  He had a history of posterior fossa ependymoma, diagnosed at age 48, underwent multiple brain surgery, VP shunt placement, brain radiation  [facility], had a repeat CT scan regularly,  He also suffered severe bilateral hearing loss, seen by ENT Dr. Hermina Barters, had a cochlear implant, in 2021  In addition, he also complains of slow worsening gait abnormality, decreased bilateral vision,  I personally reviewed CT head and MRI of the brain in May 2022 severely limited examination due to artifact from left cochlear implant, postsurgical change from previous suboccipital craniotomy, progressive T2/FLAIR signal abnormality within the right cerebellum, favor to reflect progressive posttreatment effect, right parietal VP shunt in place without hydrocephalus, underlying atrophy with chronic small vessel disease                                                          PHYSICAL EXAM:   Vitals:   10/25/22 1140  BP: 111/77  Pulse: 72  Weight: 178 lb (80.7 kg)  Height: '5\' 8"'$  (1.727 m)    Body mass index is 27.06 kg/m.  PHYSICAL EXAMNIATION:  Gen: NAD, conversant, well nourised, well groomed  Cardiovascular: Regular rate rhythm, no peripheral edema, warm, nontender. Eyes: Conjunctivae clear without exudates or hemorrhage Neck: Supple, no carotid bruits. Pulmonary: Clear to auscultation bilaterally   NEUROLOGICAL EXAM:  MENTAL STATUS: Speech/cognition: Hard of hearing, awake, alert, oriented to history taking and casual conversation CRANIAL NERVES: CN II: Visual fields are full to confrontation. Pupils are round equal and briskly reactive to light. CN III, IV, VI: extraocular movement are normal. No ptosis. CN V: Facial sensation is intact to light touch CN VII: Face is symmetric with  normal eye closure  CN VIII: Hearing is normal to causal conversation. CN IX, X: Phonation is normal. CN XI: Head turning and shoulder shrug are intact  MOTOR: There is no pronator drift of out-stretched arms. Muscle bulk and tone are normal. Muscle strength is normal.  REFLEXES: Reflexes are 2+ and symmetric at the biceps, triceps, knees, and ankles. Plantar responses are flexor.  SENSORY: Intact to light touch   COORDINATION: Significant truncal ataxia, no significant limb dysmetria  GAIT/STANCE: Need to push-up to get up from seated position, wide-based, unsteady,  REVIEW OF SYSTEMS:  Full 14 system review of systems performed and notable only for as above All other review of systems were negative.   ALLERGIES: Allergies  Allergen Reactions   Baclofen     Urinary retension   Seroquel [Quetiapine] Other (See Comments)    Weight gain   Sulfa Antibiotics Other (See Comments)    Reaction ??   Coconut Flavor Rash and Other (See Comments)    ANYTHING COCONUT- allergic to this   Contrast Media [Iodinated Contrast Media] Rash    HOME MEDICATIONS: Current Outpatient Medications  Medication Sig Dispense Refill   ALPRAZolam (XANAX) 0.5 MG tablet Take 0.5 mg by mouth 2 (two) times daily.  2   clopidogrel (PLAVIX) 75 MG tablet TAKE 1 TABLET(75 MG) BY MOUTH DAILY 90 tablet 1   metoprolol succinate (TOPROL-XL) 50 MG 24 hr tablet Take 1 tablet (50 mg total) by mouth daily. 90 tablet 2   topiramate (TOPAMAX) 50 MG tablet Take '50mg'$  every am and '100mg'$  every evening (Patient taking differently: Take 50-100 mg by mouth See admin instructions. Take '50mg'$  every am and '100mg'$  every evening) 270 tablet 3   No current facility-administered medications for this visit.    PAST MEDICAL HISTORY: Past Medical History:  Diagnosis Date   Abnormal gait    Acute arterial ischemic stroke, vertebrobasilar, thalamic ([place]) 06/08/2018   Acute CVA (cerebrovascular accident) ([place]) 06/07/2018   Allergic  rhinitis    Anxiety    Arthritis 11/05/2013   Atypical chest pain 11/30/2017   Bilateral arm weakness    Bilateral hearing loss 07/28/2020   Brain cancer ([place])    Cerebral thrombosis with cerebral infarction 04/17/2019   Cochlear implant in place with multiple channels 05/15/2020   CVA (cerebrovascular accident) ([place]) 04/18/2019   Depression    Diplopia    Dyspnea on exertion 11/30/2017   Ependymoma of brain [facility])    Episode of change in speech 04/17/2019   Generalized anxiety disorder 11/28/2018   Headache 11/05/2013   Hearing loss, sensorineural    Hemiparesis and alteration of sensations as late effects of stroke ([place]) 09/04/2018   History of stroke 04/01/2020   Hypothyroidism    Insomnia    Left leg weakness    Low vitamin B12 level    Memory loss 11/05/2013   Myalgia 04/18/2017   OSA on CPAP 11/28/2018   Recurrent falls    Refusal of blood  transfusions as patient is Jehovah's Witness 04/01/2020   Seizure ([place])    Seizures ([place])    Sleeping difficulty 11/05/2013   Stroke-like symptoms    Ventricular ectopy 06/04/2020   Weakness 04/16/2019   Weakness generalized 04/17/2019    PAST SURGICAL HISTORY: Past Surgical History:  Procedure Laterality Date   BRAIN SURGERY     HERNIA REPAIR     SHUNT REVISION      FAMILY HISTORY: Family History  Problem Relation Age of Onset   Brain cancer Mother    High blood pressure Mother     SOCIAL HISTORY: Social History   Socioeconomic History   Marital status: Single    Spouse name: Not on file   Number of children: 0   Years of education: 12   Highest education level: Not on file  Occupational History   Occupation: Disabled  Tobacco Use   Smoking status: Never   Smokeless tobacco: Never  Vaping Use   Vaping Use: Never used  Substance and Sexual Activity   Alcohol use: No   Drug use: No   Sexual activity: Yes    Birth control/protection: None  Other Topics Concern   Not on file  Social History Narrative   Lives with Aunt [name])   Caffeine use: No coffee   Soda- trying to quit      Right-handed   Social Determinants of Health   Financial Resource Strain: Low Risk  (06/07/2018)   Overall Financial Resource Strain (CARDIA)    Difficulty of Paying Living Expenses: Not hard at all  Food Insecurity: No Food Insecurity (06/07/2018)   Hunger Vital Sign    Worried About Running Out of Food in the Last Year: Never true    Ran Out of Food in the Last Year: Never true  Transportation Needs: No Transportation Needs (05/27/2022)   PRAPARE - Hydrologist (Medical): No    Lack of Transportation (Non-Medical): No  Physical Activity: Unknown (06/07/2018)   Exercise Vital Sign    Days of Exercise per Week: Patient refused    Minutes of Exercise per Session: Patient refused  Stress: No Stress Concern Present (06/07/2018)   [place]    Feeling of Stress : Not at all  Social Connections: Unknown (06/07/2018)   Social Connection and Isolation Panel [NHANES]    Frequency of Communication with Friends and Family: Twice a week    Frequency of Social Gatherings with Friends and Family: Twice a week    Attends Religious Services: Never    Marine scientist or Organizations: No    Attends Archivist Meetings: Never    Marital Status: Not on file  Intimate Partner Violence: Not At Risk (06/07/2018)   Humiliation, Afraid, Rape, and Kick questionnaire    Fear of Current or Ex-Partner: No    Emotionally Abused: No    Physically Abused: No    Sexually Abused: No    Total time spent reviewing the chart, obtaining history, examined patient, ordering tests, documentation, consultations and family, care coordination was  54 mins    Marcial Pacas, M.D. Ph.D.  [facility] [address] [place], [place] [address] Ph: [phone] Fax: [phone]  CC:  Leonides Sake, MD [place],  [place] [address]  Hamrick, Lorin Mercy, MD

## 2022-10-25 NOTE — Patient Instructions (Signed)
Stop Topamax  Starting Depakote ER '500mg'$  2 tabs every night.

## 2022-10-26 ENCOUNTER — Telehealth: Payer: Self-pay

## 2022-10-26 LAB — TSH: TSH: 4.35 u[IU]/mL (ref 0.450–4.500)

## 2022-10-26 LAB — COMPREHENSIVE METABOLIC PANEL
ALT: 13 IU/L (ref 0–44)
AST: 16 IU/L (ref 0–40)
Albumin/Globulin Ratio: 2.1 (ref 1.2–2.2)
Albumin: 4.6 g/dL (ref 4.1–5.1)
Alkaline Phosphatase: 87 IU/L (ref 44–121)
BUN/Creatinine Ratio: 9 (ref 9–20)
BUN: 12 mg/dL (ref 6–24)
Bilirubin Total: 0.6 mg/dL (ref 0.0–1.2)
CO2: 19 mmol/L — ABNORMAL LOW (ref 20–29)
Calcium: 9.6 mg/dL (ref 8.7–10.2)
Chloride: 105 mmol/L (ref 96–106)
Creatinine, Ser: 1.33 mg/dL — ABNORMAL HIGH (ref 0.76–1.27)
Globulin, Total: 2.2 g/dL (ref 1.5–4.5)
Glucose: 89 mg/dL (ref 70–99)
Potassium: 5.3 mmol/L — ABNORMAL HIGH (ref 3.5–5.2)
Sodium: 141 mmol/L (ref 134–144)
Total Protein: 6.8 g/dL (ref 6.0–8.5)
eGFR: 66 mL/min/{1.73_m2} (ref >59)

## 2022-10-26 LAB — CBC WITH DIFFERENTIAL/PLATELET
Basophils Absolute: 0.1 10*3/uL (ref 0.0–0.2)
Basos: 1 %
EOS (ABSOLUTE): 0.2 10*3/uL (ref 0.0–0.4)
Eos: 3 %
Hematocrit: 50.5 % (ref 37.5–51.0)
Hemoglobin: 17 g/dL (ref 13.0–17.7)
Immature Grans (Abs): 0 10*3/uL (ref 0.0–0.1)
Immature Granulocytes: 0 %
Lymphocytes Absolute: 1.8 10*3/uL (ref 0.7–3.1)
Lymphs: 27 %
MCH: 30.1 pg (ref 26.6–33.0)
MCHC: 33.7 g/dL (ref 31.5–35.7)
MCV: 89 fL (ref 79–97)
Monocytes Absolute: 0.5 10*3/uL (ref 0.1–0.9)
Monocytes: 8 %
Neutrophils Absolute: 4 10*3/uL (ref 1.4–7.0)
Neutrophils: 61 %
Platelets: 260 10*3/uL (ref 150–450)
RBC: 5.65 x10E6/uL (ref 4.14–5.80)
RDW: 12.1 % (ref 11.6–15.4)
WBC: 6.6 10*3/uL (ref 3.4–10.8)

## 2022-10-26 NOTE — Telephone Encounter (Signed)
Called and left VM per DPR

## 2022-10-26 NOTE — Telephone Encounter (Signed)
-----  Message from Marcial Pacas, MD sent at 10/26/2022 10:21 AM EST ----- Please call patient, laboratory evaluation showed  --- Mild elevated creatinine 1.33, which is about his baseline level, with mild elevated potassium 5.3, he will benefit increase water intake  ---Rest of the laboratory evaluation showed no significant abnormalities, -- I have forwarded lab to his primary care doctor Hamrick, Maura L, he may continue follow-up with him

## 2022-11-01 ENCOUNTER — Telehealth: Payer: Self-pay | Admitting: Neurology

## 2022-11-01 NOTE — Telephone Encounter (Signed)
Pt is calling. Stated since he had been taking divalproex (DEPAKOTE ER) 500 MG 24 hr tablet  he has gained a lot of weight. Pt is requesting a call back from nurse to discuss.

## 2022-11-02 NOTE — Telephone Encounter (Signed)
Called and LVM rq CB 

## 2022-11-02 NOTE — Telephone Encounter (Signed)
Called and spoke with patient about depakote and he wanted to inform Dr. Krista Blue that at his last visit he weighed 175  and now he weighs 188. He is not looking to change medication just wanting to give an update. Will forward to Dr. Krista Blue

## 2022-11-02 NOTE — Telephone Encounter (Signed)
Called back and LVM

## 2022-11-02 NOTE — Telephone Encounter (Signed)
error 

## 2022-11-02 NOTE — Telephone Encounter (Signed)
Pt returned call. Please call back when available. 

## 2022-11-02 NOTE — Telephone Encounter (Signed)
Will keep current dose of depakote ER '500mg'$  2 qhs

## 2022-11-10 ENCOUNTER — Ambulatory Visit (INDEPENDENT_AMBULATORY_CARE_PROVIDER_SITE_OTHER): Payer: Medicare Other | Admitting: Neurology

## 2022-11-10 ENCOUNTER — Encounter: Payer: Self-pay | Admitting: Neurology

## 2022-11-10 ENCOUNTER — Ambulatory Visit: Payer: Medicare Other | Admitting: Neurology

## 2022-11-10 ENCOUNTER — Telehealth: Payer: Self-pay | Admitting: Neurology

## 2022-11-10 VITALS — BP 117/80 | HR 70

## 2022-11-10 DIAGNOSIS — R569 Unspecified convulsions: Secondary | ICD-10-CM | POA: Diagnosis not present

## 2022-11-10 DIAGNOSIS — C719 Malignant neoplasm of brain, unspecified: Secondary | ICD-10-CM | POA: Diagnosis not present

## 2022-11-10 DIAGNOSIS — R269 Unspecified abnormalities of gait and mobility: Secondary | ICD-10-CM

## 2022-11-10 NOTE — Telephone Encounter (Signed)
Pt accepted by [facility], they will reach out to him to schedule. Phone # [phone]

## 2022-11-10 NOTE — Progress Notes (Unsigned)
Chief Complaint  Patient presents with   Follow-up    Rm 15 here for f/u ok per Dr. Krista Blue. He reports on Monday he fell and since residual numbness and weakness in the left leg.       ASSESSMENT AND PLAN  [name] Love is a 52 y.o. male   History of posterior fossa ependymoma  Multiple resection surgery in the past, status post whole brain radiation Reported history of stroke Gait abnormalities Seizure  Multiple recurrent falling/passing out spells, syncope versus recurrent seizure  Repeat EEG  Stop Topamax  Depakote ER 500 mg 2 tablets every day  Laboratory evaluations  I discussed with his power of attorney [name] Love for home physical therapy and social worker consultation, she will try other resource first,  Also advised patient and his POA, he should not drive until episode free for 6 months  Return To Clinic With NP In 6 Months   DIAGNOSTIC DATA (LABS, IMAGING, TESTING) - I reviewed patient records, labs, notes, testing and imaging myself where available.   MEDICAL HISTORY:  [name] Love, is a 52 year old male, patient of Dr. Jannifer Franklin, following up for seizure, his primary care physician is Dr. Lisbeth Ply, Lorin Mercy he drove himself to clinic today on October 25, 2022  I reviewed and summarized the referring note. PMHX. Ependymoma s/p resection, radiation therapy, in [place].  He was seen by Dr. Jannifer Franklin since 01/16/17, previous was under the care of neurologist at [facility], he used to live with his aunt, who passed away, his cousin 01-17-2023 [name] Love [phone] is his power of attorney, who live close to him, I was able to talk with [name] 15, 2024, reported that patient complains of falling down passing out spells, but she has not witnessed any recent spells, she also reported that [name] Love is not eating healthy balanced diet, most of the 14 episode happened when he was bending over, she is suspicious there might be dehydration/malnutrition component, patient also has a  lot of financial strain, rely on family to provide help,  He lives independent now, with his service dog, drove to clinic today,  He had a history of posterior fossa ependymoma, diagnosed at age 75, underwent multiple brain surgery, VP shunt placement, brain radiation  [facility], had a repeat CT scan regularly,  He also suffered severe bilateral hearing loss, seen by ENT Dr. Hermina Barters, had a cochlear implant, in 2021  In addition, he also complains of slow worsening gait abnormality, decreased bilateral vision,  I personally reviewed CT head and MRI of the brain in May 2022 severely limited examination due to artifact from left cochlear implant, postsurgical change from previous suboccipital craniotomy, progressive T2/FLAIR signal abnormality within the right cerebellum, favor to reflect progressive posttreatment effect, right parietal VP shunt in place without hydrocephalus, underlying atrophy with chronic small vessel disease                                                          PHYSICAL EXAM:   Vitals:   11/10/22 1141  BP: 117/80  Pulse: 70     There is no height or weight on file to calculate BMI.  PHYSICAL EXAMNIATION:  Gen: NAD, conversant, well nourised, well groomed  Cardiovascular: Regular rate rhythm, no peripheral edema, warm, nontender. Eyes: Conjunctivae clear without exudates or hemorrhage Neck: Supple, no carotid bruits. Pulmonary: Clear to auscultation bilaterally   NEUROLOGICAL EXAM:  MENTAL STATUS: Speech/cognition: Hard of hearing, awake, alert, oriented to history taking and casual conversation CRANIAL NERVES: CN II: Visual fields are full to confrontation. Pupils are round equal and briskly reactive to light. CN III, IV, VI: extraocular movement are normal. No ptosis. CN V: Facial sensation is intact to light touch CN VII: Face is symmetric with normal eye closure  CN VIII: Hearing is normal to causal  conversation. CN IX, X: Phonation is normal. CN XI: Head turning and shoulder shrug are intact  MOTOR: There is no pronator drift of out-stretched arms. Muscle bulk and tone are normal. Muscle strength is normal.  REFLEXES: Reflexes are 2+ and symmetric at the biceps, triceps, knees, and ankles. Plantar responses are flexor.  SENSORY: Intact to light touch   COORDINATION: Significant truncal ataxia, no significant limb dysmetria  GAIT/STANCE: Need to push-up to get up from seated position, wide-based, unsteady,  REVIEW OF SYSTEMS:  Full 14 system review of systems performed and notable only for as above All other review of systems were negative.   ALLERGIES: Allergies  Allergen Reactions   Baclofen     Urinary retension   Seroquel [Quetiapine] Other (See Comments)    Weight gain   Sulfa Antibiotics Other (See Comments)    Reaction ??   Coconut Flavor Rash and Other (See Comments)    ANYTHING COCONUT- allergic to this   Contrast Media [Iodinated Contrast Media] Rash    HOME MEDICATIONS: Current Outpatient Medications  Medication Sig Dispense Refill   ALPRAZolam (XANAX) 0.5 MG tablet Take 0.5 mg by mouth 2 (two) times daily.  2   clopidogrel (PLAVIX) 75 MG tablet TAKE 1 TABLET(75 MG) BY MOUTH DAILY 90 tablet 1   divalproex (DEPAKOTE ER) 500 MG 24 hr tablet Take 2 tablets (1,000 mg total) by mouth at bedtime. 60 tablet 11   metoprolol succinate (TOPROL-XL) 50 MG 24 hr tablet Take 1 tablet (50 mg total) by mouth daily. 90 tablet 2   No current facility-administered medications for this visit.    PAST MEDICAL HISTORY: Past Medical History:  Diagnosis Date   Abnormal gait    Acute arterial ischemic stroke, vertebrobasilar, thalamic ([place]) 06/08/2018   Acute CVA (cerebrovascular accident) ([place]) 06/07/2018   Allergic rhinitis    Anxiety    Arthritis 11/05/2013   Atypical chest pain 11/30/2017   Bilateral arm weakness    Bilateral hearing loss 07/28/2020   Brain cancer ([place])     Cerebral thrombosis with cerebral infarction 04/17/2019   Cochlear implant in place with multiple channels 05/15/2020   CVA (cerebrovascular accident) ([place]) 04/18/2019   Depression    Diplopia    Dyspnea on exertion 11/30/2017   Ependymoma of brain ([place])    Episode of change in speech 04/17/2019   Generalized anxiety disorder 11/28/2018   Headache 11/05/2013   Hearing loss, sensorineural    Hemiparesis and alteration of sensations as late effects of stroke ([place]) 09/04/2018   History of stroke 04/01/2020   Hypothyroidism    Insomnia    Left leg weakness    Low vitamin B12 level    Memory loss 11/05/2013   Myalgia 04/18/2017   OSA on CPAP 11/28/2018   Recurrent falls    Refusal of blood transfusions as patient is Jehovah's Witness 04/01/2020   Seizure ([place])  Seizures ([place])    Sleeping difficulty 11/05/2013   Stroke-like symptoms    Ventricular ectopy 06/04/2020   Weakness 04/16/2019   Weakness generalized 04/17/2019    PAST SURGICAL HISTORY: Past Surgical History:  Procedure Laterality Date   BRAIN SURGERY     HERNIA REPAIR     SHUNT REVISION      FAMILY HISTORY: Family History  Problem Relation Age of Onset   Brain cancer Mother    High blood pressure Mother     SOCIAL HISTORY: Social History   Socioeconomic History   Marital status: Single    Spouse name: Not on file   Number of children: 0   Years of education: 12   Highest education level: Not on file  Occupational History   Occupation: Disabled  Tobacco Use   Smoking status: Never   Smokeless tobacco: Never  Vaping Use   Vaping Use: Never used  Substance and Sexual Activity   Alcohol use: No   Drug use: No   Sexual activity: Yes    Birth control/protection: None  Other Topics Concern   Not on file  Social History Narrative   Lives with Aunt [name])   Caffeine use: No coffee   Soda- trying to quit      Right-handed   Social Determinants of Health   Financial Resource Strain: Low Risk   (06/07/2018)   Overall Financial Resource Strain (CARDIA)    Difficulty of Paying Living Expenses: Not hard at all  Food Insecurity: No Food Insecurity (06/07/2018)   Hunger Vital Sign    Worried About Running Out of Food in the Last Year: Never true    Ran Out of Food in the Last Year: Never true  Transportation Needs: No Transportation Needs (05/27/2022)   PRAPARE - Hydrologist (Medical): No    Lack of Transportation (Non-Medical): No  Physical Activity: Unknown (06/07/2018)   Exercise Vital Sign    Days of Exercise per Week: Patient refused    Minutes of Exercise per Session: Patient refused  Stress: No Stress Concern Present (06/07/2018)   [place]    Feeling of Stress : Not at all  Social Connections: Unknown (06/07/2018)   Social Connection and Isolation Panel [NHANES]    Frequency of Communication with Friends and Family: Twice a week    Frequency of Social Gatherings with Friends and Family: Twice a week    Attends Religious Services: Never    Marine scientist or Organizations: No    Attends Archivist Meetings: Never    Marital Status: Not on file  Intimate Partner Violence: Not At Risk (06/07/2018)   Humiliation, Afraid, Rape, and Kick questionnaire    Fear of Current or Ex-Partner: No    Emotionally Abused: No    Physically Abused: No    Sexually Abused: No     [facility] [address], [place], [place] [address] Ph: [phone] Fax: [phone]  CC:  Leonides Sake, MD [place],  [place] [address]  Hamrick, Lorin Mercy, MD

## 2022-11-10 NOTE — Progress Notes (Signed)
ASSESSMENT AND PLAN  [name] Love is a 52 y.o. male   History of posterior fossa ependymoma  Multiple resection surgery in the past, status post whole brain radiation Reported history of stroke Gait abnormalities Seizure  Multiple recurrent falling/passing out spells, syncope versus recurrent seizure  EEG,  Stop topiramate, starting Depakote ER 500 mg 2 tablets every night  Also discussed with his power of attorney his cousin [name] Love, with the concern of him staying alone    Return To Clinic With NP In 6 Months   DIAGNOSTIC DATA (LABS, IMAGING, TESTING) - I reviewed patient records, labs, notes, testing and imaging myself where available.   MEDICAL HISTORY:  [name] Love, is a 52 year old male, patient of Dr. Jannifer Love, following up for seizure, his primary care physician is Dr. Lisbeth Love, [name] Love he drove himself to clinic today on October 25, 2022  I reviewed and summarized the referring note. PMHX. Ependymoma s/p resection, radiation therapy, in [place].  He was seen by Dr. Jannifer Love since 01/29/2017, previous was under the care of neurologist at [facility], he used to live with his aunt, who passed away, his cousin 30-Jan-2023 [name] Love [phone] is his power of attorney, who live close to him, I was able to talk with 2023/01/30, reported that patient complains of falling down passing out spells, but she has not witnessed any recent spells, she also reported that [name] Love is not eating healthy balanced diet, most of the 14 episode happened when he was bending over, she is suspicious there might be dehydration/malnutrition component, patient also has a lot of financial strain, rely on family to provide help,  He lives independent now, with his service dog, drove to clinic today,  He had a history of posterior fossa ependymoma, diagnosed at age 39, underwent multiple brain surgery, VP shunt placement, brain radiation  He was followed by [facility], had a repeat CT scan  regularly,  He also suffered severe bilateral hearing loss, seen by ENT Dr. Hermina Barters, had a cochlear implant, in 2021  In addition, he also complains of slow worsening gait abnormality, decreased bilateral vision,  I personally reviewed CT head and MRI of the brain in May 2022 severely limited examination due to artifact from left cochlear implant, postsurgical change from previous suboccipital craniotomy, progressive T2/FLAIR signal abnormality within the right cerebellum, favor to reflect progressive posttreatment effect, right parietal VP shunt in place without hydrocephalus, underlying atrophy with chronic small vessel disease  UPDATE Nov 10 2022;                                                           PHYSICAL EXAM:   There were no vitals filed for this visit.   There is no height or weight on file to calculate BMI.  PHYSICAL EXAMNIATION:  Gen: NAD, conversant, well nourised, well groomed                     Cardiovascular: Regular rate rhythm, no peripheral edema, warm, nontender. Eyes: Conjunctivae clear without exudates or hemorrhage Neck: Supple, no carotid bruits. Pulmonary: Clear to auscultation bilaterally   NEUROLOGICAL EXAM:  MENTAL STATUS: Speech/cognition: Hard of hearing, awake, alert, oriented to history taking and casual conversation CRANIAL NERVES: CN II: Visual fields are full to  confrontation. Pupils are round equal and briskly reactive to light. CN III, IV, VI: extraocular movement are normal. No ptosis. CN V: Facial sensation is intact to light touch CN VII: Face is symmetric with normal eye closure  CN VIII: Hearing is normal to causal conversation. CN IX, X: Phonation is normal. CN XI: Head turning and shoulder shrug are intact  MOTOR: There is no pronator drift of out-stretched arms. Muscle bulk and tone are normal. Muscle strength is normal.  REFLEXES: Reflexes are 2+ and symmetric at the biceps, triceps, knees, and ankles. Plantar responses  are flexor.  SENSORY: Intact to light touch   COORDINATION: Significant truncal ataxia, no significant limb dysmetria  GAIT/STANCE: Need to push-up to get up from seated position, wide-based, unsteady,  REVIEW OF SYSTEMS:  Full 14 system review of systems performed and notable only for as above All other review of systems were negative.   ALLERGIES: Allergies  Allergen Reactions   Baclofen     Urinary retension   Seroquel [Quetiapine] Other (See Comments)    Weight gain   Sulfa Antibiotics Other (See Comments)    Reaction ??   Coconut Flavor Rash and Other (See Comments)    ANYTHING COCONUT- allergic to this   Contrast Media [Iodinated Contrast Media] Rash    HOME MEDICATIONS: Current Outpatient Medications  Medication Sig Dispense Refill   ALPRAZolam (XANAX) 0.5 MG tablet Take 0.5 mg by mouth 2 (two) times daily.  2   clopidogrel (PLAVIX) 75 MG tablet TAKE 1 TABLET(75 MG) BY MOUTH DAILY 90 tablet 1   divalproex (DEPAKOTE ER) 500 MG 24 hr tablet Take 2 tablets (1,000 mg total) by mouth at bedtime. 60 tablet 11   metoprolol succinate (TOPROL-XL) 50 MG 24 hr tablet Take 1 tablet (50 mg total) by mouth daily. 90 tablet 2   topiramate (TOPAMAX) 50 MG tablet Take 22m every am and 1047mevery evening (Patient taking differently: Take 50-100 mg by mouth See admin instructions. Take 5061mvery am and 100m57mery evening) 270 tablet 3   No current facility-administered medications for this visit.    PAST MEDICAL HISTORY: Past Medical History:  Diagnosis Date   Abnormal gait    Acute arterial ischemic stroke, vertebrobasilar, thalamic (HCC)[place]5/2019   Acute CVA (cerebrovascular accident) (HCC)[place]4/2019   Allergic rhinitis    Anxiety    Arthritis 11/05/2013   Atypical chest pain 11/30/2017   Bilateral arm weakness    Bilateral hearing loss 07/28/2020   Brain cancer (HCC)[place] Cerebral thrombosis with cerebral infarction 04/17/2019   Cochlear implant in place with multiple  channels 05/15/2020   CVA (cerebrovascular accident) (HCC)[place]15/2020   Depression    Diplopia    Dyspnea on exertion 11/30/2017   Ependymoma of brain (HCC)[place] Episode of change in speech 04/17/2019   Generalized anxiety disorder 11/28/2018   Headache 11/05/2013   Hearing loss, sensorineural    Hemiparesis and alteration of sensations as late effects of stroke (HCC)[place]/11/2017   History of stroke 04/01/2020   Hypothyroidism    Insomnia    Left leg weakness    Low vitamin B12 level    Memory loss 11/05/2013   Myalgia 04/18/2017   OSA on CPAP 11/28/2018   Recurrent falls    Refusal of blood transfusions as patient is Jehovah's Witness 04/01/2020   Seizure (HCC)[place] Seizures (HCC)[place] Sleeping difficulty 11/05/2013   Stroke-like symptoms    Ventricular ectopy 06/04/2020  Weakness 04/16/2019   Weakness generalized 04/17/2019    PAST SURGICAL HISTORY: Past Surgical History:  Procedure Laterality Date   BRAIN SURGERY     HERNIA REPAIR     SHUNT REVISION      FAMILY HISTORY: Family History  Problem Relation Age of Onset   Brain cancer Mother    High blood pressure Mother     SOCIAL HISTORY: Social History   Socioeconomic History   Marital status: Single    Spouse name: Not on file   Number of children: 0   Years of education: 12   Highest education level: Not on file  Occupational History   Occupation: Disabled  Tobacco Use   Smoking status: Never   Smokeless tobacco: Never  Vaping Use   Vaping Use: Never used  Substance and Sexual Activity   Alcohol use: No   Drug use: No   Sexual activity: Yes    Birth control/protection: None  Other Topics Concern   Not on file  Social History Narrative   Lives with Aunt [name])   Caffeine use: No coffee   Soda- trying to quit      Right-handed   Social Determinants of Health   Financial Resource Strain: Low Risk  (06/07/2018)   Overall Financial Resource Strain (CARDIA)    Difficulty of Paying Living Expenses: Not hard at  all  Food Insecurity: No Food Insecurity (06/07/2018)   Hunger Vital Sign    Worried About Running Out of Food in the Last Year: Never true    [place] in the Last Year: Never true  Transportation Needs: No Transportation Needs (05/27/2022)   PRAPARE - Hydrologist (Medical): No    Lack of Transportation (Non-Medical): No  Physical Activity: Unknown (06/07/2018)   Exercise Vital Sign    Days of Exercise per Week: Patient refused    Minutes of Exercise per Session: Patient refused  Stress: No Stress Concern Present (06/07/2018)   [place]    Feeling of Stress : Not at all  Social Connections: Unknown (06/07/2018)   Social Connection and Isolation Panel [NHANES]    Frequency of Communication with Friends and Family: Twice a week    Frequency of Social Gatherings with Friends and Family: Twice a week    Attends Religious Services: Never    Marine scientist or Organizations: No    Attends Archivist Meetings: Never    Marital Status: Not on file  Intimate Partner Violence: Not At Risk (06/07/2018)   Humiliation, Afraid, Rape, and Kick questionnaire    Fear of Current or Ex-Partner: No    Emotionally Abused: No    Physically Abused: No    Sexually Abused: No     [facility] [address] [place], [place] [address] Ph: [phone] Fax: [phone]  CC:  Leonides Sake, MD [place],  [place] [address]  [place], [name] Mercy, MD

## 2022-11-11 DIAGNOSIS — K080 Exfoliation of teeth due to systemic causes: Secondary | ICD-10-CM | POA: Diagnosis not present

## 2022-11-11 LAB — VALPROIC ACID LEVEL: Valproic Acid Lvl: 103 ug/mL — ABNORMAL HIGH (ref 50–100)

## 2022-11-15 ENCOUNTER — Telehealth: Payer: Self-pay

## 2022-11-15 DIAGNOSIS — R569 Unspecified convulsions: Secondary | ICD-10-CM | POA: Diagnosis not present

## 2022-11-15 DIAGNOSIS — Z9181 History of falling: Secondary | ICD-10-CM | POA: Diagnosis not present

## 2022-11-15 DIAGNOSIS — C715 Malignant neoplasm of cerebral ventricle: Secondary | ICD-10-CM | POA: Diagnosis not present

## 2022-11-15 DIAGNOSIS — I10 Essential (primary) hypertension: Secondary | ICD-10-CM | POA: Diagnosis not present

## 2022-11-15 DIAGNOSIS — Z7902 Long term (current) use of antithrombotics/antiplatelets: Secondary | ICD-10-CM | POA: Diagnosis not present

## 2022-11-15 DIAGNOSIS — F411 Generalized anxiety disorder: Secondary | ICD-10-CM | POA: Diagnosis not present

## 2022-11-15 DIAGNOSIS — M1990 Unspecified osteoarthritis, unspecified site: Secondary | ICD-10-CM | POA: Diagnosis not present

## 2022-11-15 DIAGNOSIS — G4700 Insomnia, unspecified: Secondary | ICD-10-CM | POA: Diagnosis not present

## 2022-11-15 DIAGNOSIS — F32A Depression, unspecified: Secondary | ICD-10-CM | POA: Diagnosis not present

## 2022-11-15 DIAGNOSIS — H9193 Unspecified hearing loss, bilateral: Secondary | ICD-10-CM | POA: Diagnosis not present

## 2022-11-15 DIAGNOSIS — G4733 Obstructive sleep apnea (adult) (pediatric): Secondary | ICD-10-CM | POA: Diagnosis not present

## 2022-11-15 DIAGNOSIS — E039 Hypothyroidism, unspecified: Secondary | ICD-10-CM | POA: Diagnosis not present

## 2022-11-15 DIAGNOSIS — Z8673 Personal history of transient ischemic attack (TIA), and cerebral infarction without residual deficits: Secondary | ICD-10-CM | POA: Diagnosis not present

## 2022-11-15 DIAGNOSIS — J309 Allergic rhinitis, unspecified: Secondary | ICD-10-CM | POA: Diagnosis not present

## 2022-11-15 NOTE — Telephone Encounter (Signed)
-----   Message from Marcial Pacas, MD sent at 11/11/2022 10:41 AM EST ----- Please call patient, Depakote level was 103, high normal side, but it was not trough level, please continue current medications.

## 2022-11-15 NOTE — Telephone Encounter (Signed)
Called and LVM per [facility]

## 2022-11-16 NOTE — Procedures (Signed)
   HISTORY: 52 year old male with history of ependymoma posterior fossa surgery, complains of worsening gait abnormality, history of seizure disorder  TECHNIQUE:  This is a routine 16 channel EEG recording with one channel devoted to a limited EKG recording.  It was performed during wakefulness, drowsiness and asleep.  Hyperventilation and photic stimulation were performed as activating procedures.  There are minimum muscle and movement artifact noted.  Upon maximum arousal, posterior dominant waking rhythm consistent of mildly dysrhythmic alpha range activity.  Activities are symmetric over the bilateral posterior derivations and attenuated with eye opening.  Hyperventilation produced mild/moderate buildup with higher amplitude and the slower activities noted.  Photic stimulation did not alter the tracing.  During EEG recording, patient developed drowsiness and no deeper stage of sleep was achieved  During EEG recording, there was no epileptiform discharge noted.  EKG demonstrate normal sinus rhythm.  CONCLUSION: This is a  normal awake EEG.  There is no electrodiagnostic evidence of epileptiform discharge.  Marcial Pacas, M.D. Ph.D.  [facility] [place],  [address] Phone: [phone] Fax:      [phone]

## 2022-11-24 ENCOUNTER — Other Ambulatory Visit: Payer: Self-pay

## 2022-11-24 ENCOUNTER — Telehealth: Payer: Self-pay | Admitting: Cardiology

## 2022-11-24 MED ORDER — CLOPIDOGREL BISULFATE 75 MG PO TABS: ORAL_TABLET | ORAL | 3 refills | Status: DC

## 2022-11-24 NOTE — Telephone Encounter (Signed)
Left a detailed message on the patient's voice mail explaining that the patient's Plavix had been re-filled and if he had any questions to give [place] a call back.

## 2022-11-24 NOTE — Telephone Encounter (Signed)
*  STAT* If patient is at the pharmacy, call can be transferred to refill team.   1. Which medications need to be refilled? (please list name of each medication and dose if known)  clopidogrel (PLAVIX) 75 MG tablet   2. Which pharmacy/location (including street and city if local pharmacy) is medication to be sent to?  [address]   3. Do they need a 30 day or 90 day supply?  90 day  Patient stated he has 10 tablets left.  Patient has appointment scheduled on 5/14.

## 2022-11-26 DIAGNOSIS — K080 Exfoliation of teeth due to systemic causes: Secondary | ICD-10-CM | POA: Diagnosis not present

## 2022-12-01 DIAGNOSIS — F39 Unspecified mood [affective] disorder: Secondary | ICD-10-CM | POA: Diagnosis not present

## 2022-12-01 DIAGNOSIS — F4010 Social phobia, unspecified: Secondary | ICD-10-CM | POA: Diagnosis not present

## 2022-12-01 DIAGNOSIS — F411 Generalized anxiety disorder: Secondary | ICD-10-CM | POA: Diagnosis not present

## 2022-12-09 DIAGNOSIS — K080 Exfoliation of teeth due to systemic causes: Secondary | ICD-10-CM | POA: Diagnosis not present

## 2023-01-05 DIAGNOSIS — R109 Unspecified abdominal pain: Secondary | ICD-10-CM | POA: Diagnosis not present

## 2023-01-05 DIAGNOSIS — R519 Headache, unspecified: Secondary | ICD-10-CM | POA: Diagnosis not present

## 2023-01-05 DIAGNOSIS — W19XXXA Unspecified fall, initial encounter: Secondary | ICD-10-CM | POA: Diagnosis not present

## 2023-01-05 DIAGNOSIS — S0990XA Unspecified injury of head, initial encounter: Secondary | ICD-10-CM | POA: Diagnosis not present

## 2023-01-06 DIAGNOSIS — S20212A Contusion of left front wall of thorax, initial encounter: Secondary | ICD-10-CM | POA: Diagnosis not present

## 2023-01-06 DIAGNOSIS — H539 Unspecified visual disturbance: Secondary | ICD-10-CM | POA: Diagnosis not present

## 2023-01-06 DIAGNOSIS — R55 Syncope and collapse: Secondary | ICD-10-CM | POA: Diagnosis not present

## 2023-01-06 DIAGNOSIS — R0781 Pleurodynia: Secondary | ICD-10-CM | POA: Diagnosis not present

## 2023-01-06 DIAGNOSIS — R413 Other amnesia: Secondary | ICD-10-CM | POA: Diagnosis not present

## 2023-01-06 DIAGNOSIS — R519 Headache, unspecified: Secondary | ICD-10-CM | POA: Diagnosis not present

## 2023-01-06 DIAGNOSIS — Z982 Presence of cerebrospinal fluid drainage device: Secondary | ICD-10-CM | POA: Diagnosis not present

## 2023-01-06 DIAGNOSIS — Z043 Encounter for examination and observation following other accident: Secondary | ICD-10-CM | POA: Diagnosis not present

## 2023-01-06 DIAGNOSIS — R569 Unspecified convulsions: Secondary | ICD-10-CM | POA: Diagnosis not present

## 2023-01-06 DIAGNOSIS — R58 Hemorrhage, not elsewhere classified: Secondary | ICD-10-CM | POA: Diagnosis not present

## 2023-01-06 DIAGNOSIS — R296 Repeated falls: Secondary | ICD-10-CM | POA: Diagnosis not present

## 2023-01-06 DIAGNOSIS — W19XXXA Unspecified fall, initial encounter: Secondary | ICD-10-CM | POA: Diagnosis not present

## 2023-02-15 ENCOUNTER — Ambulatory Visit: Payer: Medicare Other | Attending: Cardiology | Admitting: Cardiology

## 2023-02-15 ENCOUNTER — Encounter: Payer: Self-pay | Admitting: Cardiology

## 2023-02-15 VITALS — BP 126/80 | HR 62 | Ht 68.0 in | Wt 192.8 lb

## 2023-02-15 DIAGNOSIS — I4729 Other ventricular tachycardia: Secondary | ICD-10-CM

## 2023-02-15 DIAGNOSIS — C719 Malignant neoplasm of brain, unspecified: Secondary | ICD-10-CM | POA: Diagnosis not present

## 2023-02-15 DIAGNOSIS — I493 Ventricular premature depolarization: Secondary | ICD-10-CM

## 2023-02-15 DIAGNOSIS — Z9621 Cochlear implant status: Secondary | ICD-10-CM | POA: Diagnosis not present

## 2023-02-15 DIAGNOSIS — R269 Unspecified abnormalities of gait and mobility: Secondary | ICD-10-CM

## 2023-02-15 NOTE — Progress Notes (Unsigned)
Cardiology Office Note:    Date:  02/15/2023   ID:  Gildardo Griffes, DOB 1971-09-20, MRN [id number]  PCP:  Ailene Ravel, MD  Cardiologist:  Gypsy Balsam, MD    Referring MD: Ailene Ravel, MD   Chief Complaint  Patient presents with   Follow-up    History of Present Illness:    [name] Love is a 52 y.o. male with very complex past medical history which include brain cancer, history of CVA, multiple brain surgery, whole brain radiation, IV following for frequent ventricular ectopy monitor showed total burden of 13.3% however he is completely asymptomatic he also got 1 episode of ventricular tachycardia which was 8 beats at rate of 255.  Evaluation of the condition included echocardiogram showed preserved left ventricle ejection fraction he was put on beta-blocker seems to be doing quite well comes today for follow-up cardiac wise doing well.  Denies have any chest pain tightness squeezing pressure burning chest no palpitation dizziness swelling of lower extremities, he does have frequent falls but that is because of balance he did not passed out.  He is upset because he gained 20 pounds after starting new medication for his seizures, and that gave him some shortness of breath especially when he is bending forward.  Past Medical History:  Diagnosis Date   Abnormal gait    Acute arterial ischemic stroke, vertebrobasilar, thalamic (HCC) 06/08/2018   Acute CVA (cerebrovascular accident) (HCC) 06/07/2018   Allergic rhinitis    Anxiety    Arthritis 11/05/2013   Atypical chest pain 11/30/2017   Bilateral arm weakness    Bilateral hearing loss 07/28/2020   Brain cancer (HCC)    Cerebral thrombosis with cerebral infarction 04/17/2019   Cochlear implant in place with multiple channels 05/15/2020   CVA (cerebrovascular accident) (HCC) 04/18/2019   Depression    Diplopia    Dyspnea on exertion 11/30/2017   Ependymoma of brain (HCC)    Episode of change in speech 04/17/2019   Generalized anxiety  disorder 11/28/2018   Headache 11/05/2013   Hearing loss, sensorineural    Hemiparesis and alteration of sensations as late effects of stroke (HCC) 09/04/2018   History of stroke 04/01/2020   Hypothyroidism    Insomnia    Left leg weakness    Low vitamin B12 level    Memory loss 11/05/2013   Myalgia 04/18/2017   OSA on CPAP 11/28/2018   Recurrent falls    Refusal of blood transfusions as patient is Jehovah's Witness 04/01/2020   Seizure (HCC)    Seizures (HCC)    Sleeping difficulty 11/05/2013   Stroke-like symptoms    Ventricular ectopy 06/04/2020   Weakness 04/16/2019   Weakness generalized 04/17/2019    Past Surgical History:  Procedure Laterality Date   BRAIN SURGERY     HERNIA REPAIR     SHUNT REVISION      Current Medications: Current Meds  Medication Sig   ALPRAZolam (XANAX) 0.5 MG tablet Take 0.5 mg by mouth 2 (two) times daily.   clopidogrel (PLAVIX) 75 MG tablet TAKE 1 TABLET(75 MG) BY MOUTH DAILY (Patient taking differently: Take 75 mg by mouth daily. TAKE 1 TABLET(75 MG) BY MOUTH DAILY)   divalproex (DEPAKOTE ER) 500 MG 24 hr tablet Take 2 tablets (1,000 mg total) by mouth at bedtime.   metoprolol succinate (TOPROL-XL) 50 MG 24 hr tablet Take 1 tablet (50 mg total) by mouth daily.     Allergies:   Baclofen, Seroquel [quetiapine], Sulfa antibiotics, Coconut flavor, and  Contrast media [iodinated contrast media]   Social History   Socioeconomic History   Marital status: Single    Spouse name: Not on file   Number of children: 0   Years of education: 12   Highest education level: Not on file  Occupational History   Occupation: Disabled  Tobacco Use   Smoking status: Never   Smokeless tobacco: Never  Vaping Use   Vaping Use: Never used  Substance and Sexual Activity   Alcohol use: No   Drug use: No   Sexual activity: Yes    Birth control/protection: None  Other Topics Concern   Not on file  Social History Narrative   Lives with Aunt Karma Ganja)   Caffeine  use: No coffee   Soda- trying to quit      Right-handed   Social Determinants of Health   Financial Resource Strain: Low Risk  (06/07/2018)   Overall Financial Resource Strain (CARDIA)    Difficulty of Paying Living Expenses: Not hard at all  Food Insecurity: No Food Insecurity (06/07/2018)   Hunger Vital Sign    Worried About Running Out of Food in the Last Year: Never true    Ran Out of Food in the Last Year: Never true  Transportation Needs: No Transportation Needs (05/27/2022)   PRAPARE - Administrator, Civil Service (Medical): No    Lack of Transportation (Non-Medical): No  Physical Activity: Unknown (06/07/2018)   Exercise Vital Sign    Days of Exercise per Week: Patient declined    Minutes of Exercise per Session: Patient declined  Stress: No Stress Concern Present (06/07/2018)   [employer] of Occupational Health - Occupational Stress Questionnaire    Feeling of Stress : Not at all  Social Connections: Unknown (06/07/2018)   Social Connection and Isolation Panel [NHANES]    Frequency of Communication with Friends and Family: Twice a week    Frequency of Social Gatherings with Friends and Family: Twice a week    Attends Religious Services: Never    Database administrator or Organizations: No    Attends Engineer, structural: Never    Marital Status: Not on file     Family History: The patient's family history includes Brain cancer in his mother; High blood pressure in his mother. ROS:   Please see the history of present illness.    All 14 point review of systems negative except as described per history of present illness  EKGs/Labs/Other Studies Reviewed:      Recent Labs: 10/25/2022: ALT 13; BUN 12; Creatinine, Ser 1.33; Hemoglobin 17.0; Platelets 260; Potassium 5.3; Sodium 141; TSH 4.350  Recent Lipid Panel    Component Value Date/Time   CHOL 119 04/17/2019 0810   TRIG 104 04/17/2019 0810   HDL 34 (L) 04/17/2019 0810   CHOLHDL 3.5 04/17/2019  0810   VLDL 21 04/17/2019 0810   LDLCALC 64 04/17/2019 0810    Physical Exam:    VS:  BP 126/80 (BP Location: Left Arm, Patient Position: Sitting)   Pulse 62   Ht 5\' 8"  (1.727 m)   Wt 192 lb 12.8 oz (87.5 kg)   SpO2 96%   BMI 29.32 kg/m     Wt Readings from Last 3 Encounters:  02/15/23 192 lb 12.8 oz (87.5 kg)  10/25/22 178 lb (80.7 kg)  01/07/22 168 lb 6.4 oz (76.4 kg)     GEN:  Well nourished, well developed in no acute distress HEENT: Normal NECK: No JVD;  No carotid bruits LYMPHATICS: No lymphadenopathy CARDIAC: RRR, no murmurs, no rubs, no gallops RESPIRATORY:  Clear to auscultation without rales, wheezing or rhonchi  ABDOMEN: Soft, non-tender, non-distended MUSCULOSKELETAL:  No edema; No deformity  SKIN: Warm and dry LOWER EXTREMITIES: no swelling NEUROLOGIC:  Alert and oriented x 3 PSYCHIATRIC:  Normal affect   ASSESSMENT:    1. Ventricular ectopy   2. Nonsustained ventricular tachycardia (HCC)   3. Malignant neoplasm of brain, unspecified location (HCC)   4. Cochlear implant in place with multiple channels   5. Abnormal gait    PLAN:    In order of problems listed above:  Ventricular ectopy with history of nonsustained ventricular tachycardia, denies have any palpitations, denies having any passing out he does have unsteady gait and frequent falls.  I will ask him to have an echocardiogram done to make sure his left ventricle ejection preserved.  I will make sure he is not developing PVCs induced cardiomyopathy. Malignant neoplasm of the brain.  That being followed by neurology. He also has some psychiatric issues she is followed by psychiatrist. Abnormal gait, cochlear implant.  Noted.  Dyslipidemia I did review his K PN which show me LDL 74 HDL 34.  Will continue present management   Medication Adjustments/Labs and Tests Ordered: Current medicines are reviewed at length with the patient today.  Concerns regarding medicines are outlined above.  No orders  of the defined types were placed in this encounter.  Medication changes: No orders of the defined types were placed in this encounter.   Signed, Georgeanna Lea, MD, [facility] 02/15/2023 9:10 AM    [facility]

## 2023-02-15 NOTE — Patient Instructions (Signed)
Medication Instructions:  Your physician recommends that you continue on your current medications as directed. Please refer to the Current Medication list given to you today.  *If you need a refill on your cardiac medications before your next appointment, please call your pharmacy*   Lab Work: None If you have labs (blood work) drawn today and your tests are completely normal, you will receive your results only by: MyChart Message (if you have MyChart) OR A paper copy in the mail If you have any lab test that is abnormal or we need to change your treatment, we will call you to review the results.   Testing/Procedures: Your physician has requested that you have an echocardiogram. Echocardiography is a painless test that uses sound waves to create images of your heart. It provides your doctor with information about the size and shape of your heart and how well your heart's chambers and valves are working. This procedure takes approximately one hour. There are no restrictions for this procedure. Please do NOT wear cologne, perfume, aftershave, or lotions (deodorant is allowed). Please arrive 15 minutes prior to your appointment time.    Follow-Up: At [facility], you and your health needs are our priority.  As part of our continuing mission to provide you with exceptional heart care, we have created designated Provider Care Teams.  These Care Teams include your primary Cardiologist (physician) and Advanced Practice Providers (APPs -  Physician Assistants and Nurse Practitioners) who all work together to provide you with the care you need, when you need it.  We recommend signing up for the patient portal called "MyChart".  Sign up information is provided on this After Visit Summary.  MyChart is used to connect with patients for Virtual Visits (Telemedicine).  Patients are able to view lab/test results, encounter notes, upcoming appointments, etc.  Non-urgent messages can be sent to your  provider as well.   To learn more about what you can do with MyChart, go to [email or web address].    Your next appointment:   1 year(s)  Provider:   Gypsy Balsam, MD    Other Instructions Patient declined EKG chaperone

## 2023-02-18 DIAGNOSIS — F331 Major depressive disorder, recurrent, moderate: Secondary | ICD-10-CM | POA: Diagnosis not present

## 2023-02-22 ENCOUNTER — Telehealth: Payer: Self-pay | Admitting: Neurology

## 2023-02-22 ENCOUNTER — Other Ambulatory Visit: Payer: Self-pay | Admitting: Neurology

## 2023-02-22 DIAGNOSIS — F4010 Social phobia, unspecified: Secondary | ICD-10-CM | POA: Diagnosis not present

## 2023-02-22 DIAGNOSIS — F411 Generalized anxiety disorder: Secondary | ICD-10-CM | POA: Diagnosis not present

## 2023-02-22 DIAGNOSIS — F39 Unspecified mood [affective] disorder: Secondary | ICD-10-CM | POA: Diagnosis not present

## 2023-02-22 MED ORDER — OXCARBAZEPINE 300 MG PO TABS: 300.0000 mg | ORAL_TABLET | Freq: Two times a day (BID) | ORAL | 6 refills | Status: DC

## 2023-02-22 NOTE — Telephone Encounter (Signed)
He has allergies to sulfa, no Zonisamide, we will start him on Trileptal 300 mg twice daily.

## 2023-02-22 NOTE — Telephone Encounter (Signed)
Please advise patient to discontinue Depakote due to weight gain and start Zonisamide 200 mg at bedtime.

## 2023-02-22 NOTE — Telephone Encounter (Signed)
Spoke with Jazelyn Sipe, POA and gave instructions on stopping  Depakote and starting trileptal. Myrtha Tonkovich verbalized understanding. Encouraged to call with further needs or concerns.Appreciative of call.

## 2023-02-22 NOTE — Telephone Encounter (Signed)
Pt stated he is taking divalproex (DEPAKOTE ER) 500 MG 24 hr tablet and it's making him gain a lot of weight. Pt is requesting a call back from nurse.

## 2023-02-22 NOTE — Telephone Encounter (Signed)
Call to inform patient of of new medication Trileptal 300 mg 2x daily. Advised to stop Depakote tonight and start trileptal. Patient verbalized understanding with teach back.Patient in agreement for me to call Tiwanna Tuch, POA and review new medication and instructions. Patient appreciative of call.

## 2023-02-22 NOTE — Telephone Encounter (Signed)
Call patient to discuss weight gain after starting Depakote in 2/24. Last weight was 178lb in 1/34 and recent weight at PCP 02/15/23 was 192lb. Patient complaints of shortness of breath and is also following up with cardiologist as well but they has told patient they believe weight gain could be from Depakote ( per patient report). Advised Dr. Terrace Arabia was out of ton nut would seen to covering MD for advise. He has ben seizure free since stating depakote

## 2023-03-14 NOTE — Telephone Encounter (Signed)
The changes occurred 2 weeks ago, he needs to control hs portion and eat better.

## 2023-03-14 NOTE — Telephone Encounter (Signed)
Pt aware.

## 2023-03-14 NOTE — Telephone Encounter (Signed)
Pt stated he started a new medication and he's still experiencing weight gain. Pt is requesting a call back from nurse to discuss.

## 2023-03-14 NOTE — Telephone Encounter (Signed)
Depakote was just stopped about 3 weeks ago.  Not enough time to see a change in his weight.  I would continue with the Trileptal.  Watch his diet.

## 2023-03-14 NOTE — Telephone Encounter (Signed)
Called patient who is still concerned on weight gain, even with the changes in medications. He is still 190 lb and wants advise on what next steps should be. Patient is requesting call back with recommendations.

## 2023-03-16 ENCOUNTER — Ambulatory Visit (HOSPITAL_BASED_OUTPATIENT_CLINIC_OR_DEPARTMENT_OTHER)
Admission: RE | Admit: 2023-03-16 | Discharge: 2023-03-16 | Disposition: A | Discharge status: Home / Self Care | Payer: Medicare Other | Source: Ambulatory Visit | Attending: Cardiology | Admitting: Cardiology

## 2023-03-16 DIAGNOSIS — I493 Ventricular premature depolarization: Secondary | ICD-10-CM | POA: Diagnosis not present

## 2023-03-16 DIAGNOSIS — R269 Unspecified abnormalities of gait and mobility: Secondary | ICD-10-CM | POA: Diagnosis not present

## 2023-03-16 DIAGNOSIS — C719 Malignant neoplasm of brain, unspecified: Secondary | ICD-10-CM | POA: Diagnosis not present

## 2023-03-16 DIAGNOSIS — I4729 Other ventricular tachycardia: Secondary | ICD-10-CM | POA: Insufficient documentation

## 2023-03-16 DIAGNOSIS — R600 Localized edema: Secondary | ICD-10-CM | POA: Insufficient documentation

## 2023-03-16 DIAGNOSIS — Z9621 Cochlear implant status: Secondary | ICD-10-CM | POA: Insufficient documentation

## 2023-03-16 DIAGNOSIS — R0602 Shortness of breath: Secondary | ICD-10-CM | POA: Diagnosis not present

## 2023-03-16 LAB — ECHOCARDIOGRAM COMPLETE
AR max vel: 2.95 cm2
AV Area VTI: 2.62 cm2
AV Area mean vel: 2.72 cm2
AV Mean grad: 3 mmHg
AV Peak grad: 4.7 mmHg
Ao pk vel: 1.08 m/s
Area-P 1/2: 5.42 cm2
S' Lateral: 2.7 cm

## 2023-03-17 ENCOUNTER — Telehealth: Payer: Self-pay

## 2023-03-17 DIAGNOSIS — H6123 Impacted cerumen, bilateral: Secondary | ICD-10-CM | POA: Diagnosis not present

## 2023-03-17 DIAGNOSIS — Z45321 Encounter for adjustment and management of cochlear device: Secondary | ICD-10-CM | POA: Diagnosis not present

## 2023-03-17 DIAGNOSIS — Z9621 Cochlear implant status: Secondary | ICD-10-CM | POA: Diagnosis not present

## 2023-03-17 DIAGNOSIS — Z923 Personal history of irradiation: Secondary | ICD-10-CM | POA: Diagnosis not present

## 2023-03-17 DIAGNOSIS — H903 Sensorineural hearing loss, bilateral: Secondary | ICD-10-CM | POA: Diagnosis not present

## 2023-03-17 DIAGNOSIS — Z4889 Encounter for other specified surgical aftercare: Secondary | ICD-10-CM | POA: Diagnosis not present

## 2023-03-17 DIAGNOSIS — Z8673 Personal history of transient ischemic attack (TIA), and cerebral infarction without residual deficits: Secondary | ICD-10-CM | POA: Diagnosis not present

## 2023-03-17 NOTE — Telephone Encounter (Signed)
Patient is returning call. Requesting return call.  

## 2023-03-17 NOTE — Telephone Encounter (Signed)
LVM and My Chart message per DPR- Per Dr. Krasowski's note. Encouraged pt call with any questions. Routed to PCP. 

## 2023-04-04 ENCOUNTER — Telehealth: Payer: Self-pay | Admitting: Cardiology

## 2023-04-04 ENCOUNTER — Other Ambulatory Visit: Payer: Self-pay | Admitting: Cardiology

## 2023-04-04 DIAGNOSIS — L820 Inflamed seborrheic keratosis: Secondary | ICD-10-CM | POA: Diagnosis not present

## 2023-04-04 DIAGNOSIS — B079 Viral wart, unspecified: Secondary | ICD-10-CM | POA: Diagnosis not present

## 2023-04-04 DIAGNOSIS — L570 Actinic keratosis: Secondary | ICD-10-CM | POA: Diagnosis not present

## 2023-04-04 NOTE — Telephone Encounter (Signed)
Metoprolol XL 50mg  1 tablet daily #90 ref x3

## 2023-04-04 NOTE — Telephone Encounter (Signed)
*  STAT* If patient is at the pharmacy, call can be transferred to refill team.   1. Which medications need to be refilled? (please list name of each medication and dose if known)   metoprolol succinate (TOPROL-XL) 50 MG 24 hr tablet    2. Which pharmacy/location (including street and city if local pharmacy) is medication to be sent to?  [facility] [phone] - [place], [place] - [address] AT [address] OF [address]      3. Do they need a 30 day or 90 day supply? 90 day

## 2023-04-27 ENCOUNTER — Telehealth: Payer: Self-pay

## 2023-04-27 ENCOUNTER — Ambulatory Visit: Payer: Medicare Other | Admitting: Neurology

## 2023-04-27 VITALS — BP 118/85 | HR 82 | Ht 68.0 in | Wt 189.0 lb

## 2023-04-27 DIAGNOSIS — C719 Malignant neoplasm of brain, unspecified: Secondary | ICD-10-CM | POA: Diagnosis not present

## 2023-04-27 DIAGNOSIS — R296 Repeated falls: Secondary | ICD-10-CM | POA: Diagnosis not present

## 2023-04-27 DIAGNOSIS — R569 Unspecified convulsions: Secondary | ICD-10-CM

## 2023-04-27 NOTE — Patient Instructions (Signed)
Recommend against driving  Continue Trileptal  Check labs  Check ambulatory EEG to check for seizures  Follow up in 6 months

## 2023-04-27 NOTE — Progress Notes (Signed)
Patient: [name] Love Date of Birth: 01-30-1971  Reason for Visit: Follow up History from: Patient Primary Neurologist:   ASSESSMENT AND PLAN 52 y.o. year old male   1.  History of posterior fossa ependymoma 2.  Gait abnormalities, fall risk 3.  Seizures  -Check ambulatory 72-hour video EEG for further evaluation of seizures versus syncope versus gait instability -Somewhat poor historian, it seems the Depakote and then Trileptal has been helpful to reduce frequency.  Does mention after a fall he feels confused.  Seizure?  He has an unsteady gait, tends to stand quickly -Continue Trileptal 300 mg twice daily, check labs today -Recommend against driving, recommend increased supervision as he lives alone -Referral to home health for PT/OT for gait training -Follow-up in 6 months or sooner if needed  HISTORY  [name] Love, is a 52 year old male, patient of Dr. Anne Hahn, following up for seizure, his primary care physician is Dr. Nathanial Rancher, [place] he drove himself to clinic today on October 25, 2022   I reviewed and summarized the referring note. PMHX. Ependymoma s/p resection, radiation therapy, in [place].   He was seen by Dr. Anne Hahn since 04/27/18previous was under the care of neurologist at [facility], he used to live with his aunt, who passed away, his cousin Jan 29, 2023 Zonia Kief [phone] is his power of attorney, who live close to him, I was able to talk with 2023/01/29, reported that patient complains of falling down passing out spells, but she has not witnessed any recent spells, she also reported that Tamel is not eating healthy balanced diet, most of the 14 episode happened when he was bending over, she is suspicious there might be dehydration/malnutrition component, patient also has a lot of financial strain, rely on family to provide help,   He lives independent now, with his service dog, drove to clinic today,   He had a history of posterior fossa ependymoma,  diagnosed at age 78, underwent multiple brain surgery, VP shunt placement, brain radiation   [facility] was [facility] by [facility], had a repeat CT scan regularly,   He also suffered severe bilateral hearing loss, seen by ENT Dr. Maximino Sarin, had a cochlear implant, in 2021   In addition, he also complains of slow worsening gait abnormality, decreased bilateral vision,   I personally reviewed CT head and MRI of the brain in May 2022 severely limited examination due to artifact from left cochlear implant, postsurgical change from previous suboccipital craniotomy, progressive T2/FLAIR signal abnormality within the right cerebellum, favor to reflect progressive posttreatment effect, right parietal VP shunt in place without hydrocephalus, underlying atrophy with chronic small vessel disease   UPDATE Nov 10 2022: He drove to clinic today, despite multiple conversations in the past, he should not drive, with his declining functional status, gait abnormality, multiple brain surgery, seizure, he lives alone, cousin was his power of attorney, he also have significant gait abnormality, high risk for fall, EEG today showed no significant abnormality   Since his medication was switched to Depakote ER 500 mg 2 tablets at night, he complains of increased unsteadiness, dizziness sensation, he did stop Topamax  Update April 27, 2023 SS: Dr. Terrace Arabia started Depakote in January 2024, he reported weight gain in May 2024, was switched to Trileptal. Tolerating Trileptal 300 mg BID. In Apr 27, 2024fall when to [facility], no imaging abnormality. He lives alone, his cousin lives close by. He drives a car. EEG was normal feb 2024. He has gait instability, grabs  onto walls. Passing out spells are less, are once every 2 weeks. Fall in the floor suddenly, hard time getting up feels weak and dizzy. He doesn't know if these spells are seizures?  REVIEW OF SYSTEMS: Out of a complete 14 system review of symptoms, the patient complains only of the  following symptoms, and all other reviewed systems are negative.  See HPI  ALLERGIES: Allergies  Allergen Reactions   Baclofen     Urinary retension   Seroquel [Quetiapine] Other (See Comments)    Weight gain   Sulfa Antibiotics Other (See Comments)    Reaction ??   Coconut Flavor Rash and Other (See Comments)    ANYTHING COCONUT- allergic to this   Contrast Media [Iodinated Contrast Media] Rash    HOME MEDICATIONS: Outpatient Medications Prior to Visit  Medication Sig Dispense Refill   ALPRAZolam (XANAX) 0.5 MG tablet Take 0.5 mg by mouth 2 (two) times daily.  2   clopidogrel (PLAVIX) 75 MG tablet TAKE 1 TABLET(75 MG) BY MOUTH DAILY (Patient taking differently: Take 75 mg by mouth daily. TAKE 1 TABLET(75 MG) BY MOUTH DAILY) 90 tablet 3   fluticasone (FLONASE) 50 MCG/ACT nasal spray Place 2 sprays into both nostrils 2 (two) times daily as needed for allergies or rhinitis.     metoprolol succinate (TOPROL-XL) 50 MG 24 hr tablet Take 50 mg by mouth daily. Take with or immediately following a meal.     Oxcarbazepine (TRILEPTAL) 300 MG tablet Take 1 tablet (300 mg total) by mouth 2 (two) times daily. 60 tablet 6   metoprolol succinate (TOPROL-XL) 50 MG 24 hr tablet TAKE 1 TABLET(50 MG) BY MOUTH DAILY 90 tablet 3   No facility-administered medications prior to visit.    PAST MEDICAL HISTORY: Past Medical History:  Diagnosis Date   Abnormal gait    Acute arterial ischemic stroke, vertebrobasilar, thalamic (HCC) 06/08/2018   Acute CVA (cerebrovascular accident) (HCC) 06/07/2018   Allergic rhinitis    Anxiety    Arthritis 11/05/2013   Atypical chest pain 11/30/2017   Bilateral arm weakness    Bilateral hearing loss 07/28/2020   Brain cancer (HCC)    Cerebral thrombosis with cerebral infarction 04/17/2019   Cochlear implant in place with multiple channels 05/15/2020   CVA (cerebrovascular accident) (HCC) 04/18/2019   Depression    Diplopia    Dyspnea on exertion 11/30/2017   Ependymoma  of brain (HCC)    Episode of change in speech 04/17/2019   Generalized anxiety disorder 11/28/2018   Headache 11/05/2013   Hearing loss, sensorineural    Hemiparesis and alteration of sensations as late effects of stroke (HCC) 09/04/2018   History of stroke 04/01/2020   Hypothyroidism    Insomnia    Left leg weakness    Low vitamin B12 level    Memory loss 11/05/2013   Myalgia 04/18/2017   OSA on CPAP 11/28/2018   Recurrent falls    Refusal of blood transfusions as patient is Jehovah's Witness 04/01/2020   Seizure (HCC)    Seizures (HCC)    Sleeping difficulty 11/05/2013   Stroke-like symptoms    Ventricular ectopy 06/04/2020   Weakness 04/16/2019   Weakness generalized 04/17/2019    PAST SURGICAL HISTORY: Past Surgical History:  Procedure Laterality Date   BRAIN SURGERY     HERNIA REPAIR     SHUNT REVISION      FAMILY HISTORY: Family History  Problem Relation Age of Onset   Brain cancer Mother    High blood  pressure Mother     SOCIAL HISTORY: Social History   Socioeconomic History   Marital status: Single    Spouse name: Not on file   Number of children: 0   Years of education: 12   Highest education level: Not on file  Occupational History   Occupation: Disabled  Tobacco Use   Smoking status: Never   Smokeless tobacco: Never  Vaping Use   Vaping status: Never Used  Substance and Sexual Activity   Alcohol use: No   Drug use: No   Sexual activity: Yes    Birth control/protection: None  Other Topics Concern   Not on file  Social History Narrative   Lives with Aunt Karma Ganja)   Caffeine use: No coffee   Soda- trying to quit      Right-handed   Social Determinants of Health   Financial Resource Strain: Low Risk  (06/07/2018)   Overall Financial Resource Strain (CARDIA)    Difficulty of Paying Living Expenses: Not hard at all  Food Insecurity: Low Risk  (03/17/2023)   [facility]   Food vital sign    Within the past 12 months,  you worried that your food would run out before you got money to buy more: Never true    Within the past 12 months, the food you bought just didn't last and you didn't have money to get more. : Never true  Transportation Needs: Not on file (03/17/2023)  Physical Activity: Unknown (06/07/2018)   Exercise Vital Sign    Days of Exercise per Week: Patient declined    Minutes of Exercise per Session: Patient declined  Stress: No Stress Concern Present (06/07/2018)   [employer] of Occupational Health - Occupational Stress Questionnaire    Feeling of Stress : Not at all  Social Connections: Unknown (06/07/2018)   Social Connection and Isolation Panel [NHANES]    Frequency of Communication with Friends and Family: Twice a week    Frequency of Social Gatherings with Friends and Family: Twice a week    Attends Religious Services: Never    Database administrator or Organizations: No    Attends Banker Meetings: Never    Marital Status: Not on file  Intimate Partner Violence: Not At Risk (06/07/2018)   Humiliation, Afraid, Rape, and Kick questionnaire    Fear of Current or Ex-Partner: No    Emotionally Abused: No    Physically Abused: No    Sexually Abused: No    PHYSICAL EXAM  Vitals:   04/27/23 1516  BP: 118/85  Pulse: 82  Weight: 189 lb (85.7 kg)  Height: 5\' 8"  (1.727 m)   Body mass index is 28.74 kg/m.  Generalized: Well developed, in no acute distress  Neurological examination  Mentation: Alert oriented to time, place, history taking. Follows all commands speech and language fluent Cranial nerve II-XII: Pupils were equal round reactive to light. Extraocular movements were full, visual field were full on confrontational test. Facial sensation and strength were normal. Head turning and shoulder shrug  were normal and symmetric. Motor: The motor testing reveals 5 over 5 strength of all 4 extremities. Good symmetric motor tone is noted throughout.  Sensory: Sensory testing  is intact to soft touch on all 4 extremities. No evidence of extinction is noted.  Coordination: Cerebellar testing reveals good finger-nose-finger and heel-to-shin bilaterally.  Gait and station: Gait is wide-based, unsteady, shuffling type/waddle.  Stood quickly, tendency to lean backwards. Reflexes: Deep tendon reflexes  are symmetric and normal bilaterally.   DIAGNOSTIC DATA (LABS, IMAGING, TESTING) - I reviewed patient records, labs, notes, testing and imaging myself where available.  Lab Results  Component Value Date   WBC 6.6 10/25/2022   HGB 17.0 10/25/2022   HCT 50.5 10/25/2022   MCV 89 10/25/2022   PLT 260 10/25/2022      Component Value Date/Time   NA 141 10/25/2022 1334   NA 142 11/06/2014 1751   K 5.3 (H) 10/25/2022 1334   K 3.7 11/06/2014 1751   CL 105 10/25/2022 1334   CL 106 11/06/2014 1751   CO2 19 (L) 10/25/2022 1334   CO2 29 11/06/2014 1751   GLUCOSE 89 10/25/2022 1334   GLUCOSE 88 02/27/2021 1813   GLUCOSE 91 11/06/2014 1751   BUN 12 10/25/2022 1334   BUN 14 11/06/2014 1751   CREATININE 1.33 (H) 10/25/2022 1334   CREATININE 1.11 11/06/2014 1751   CALCIUM 9.6 10/25/2022 1334   CALCIUM 8.8 11/06/2014 1751   PROT 6.8 10/25/2022 1334   PROT 6.9 11/06/2014 1751   ALBUMIN 4.6 10/25/2022 1334   ALBUMIN 3.8 11/06/2014 1751   AST 16 10/25/2022 1334   AST 33 11/06/2014 1751   ALT 13 10/25/2022 1334   ALT 55 11/06/2014 1751   ALKPHOS 87 10/25/2022 1334   ALKPHOS 89 11/06/2014 1751   BILITOT 0.6 10/25/2022 1334   BILITOT 0.4 11/06/2014 1751   GFRNONAA >60 02/27/2021 1813   GFRNONAA >60 11/06/2014 1751   GFRNONAA >60 03/12/2014 1140   GFRAA >60 04/17/2019 0810   GFRAA >60 11/06/2014 1751   GFRAA >60 03/12/2014 1140   Lab Results  Component Value Date   CHOL 119 04/17/2019   HDL 34 (L) 04/17/2019   LDLCALC 64 04/17/2019   TRIG 104 04/17/2019   CHOLHDL 3.5 04/17/2019   Lab Results  Component Value Date   HGBA1C 5.3 04/17/2019   Lab Results   Component Value Date   VITAMINB12 238 04/17/2019   Lab Results  Component Value Date   TSH 4.350 10/25/2022    Margie Ege, AGNP-C, DNP 04/27/2023, 3:49 PM [facility] [address], [address] [place], [place] [address] [phone]

## 2023-04-27 NOTE — Telephone Encounter (Signed)
Eeg order faxed to [facility]

## 2023-04-27 NOTE — Progress Notes (Signed)
[facility] paperwork sent

## 2023-04-28 ENCOUNTER — Telehealth: Payer: Self-pay | Admitting: Neurology

## 2023-04-28 LAB — CBC WITH DIFFERENTIAL/PLATELET
Basophils Absolute: 0.1 10*3/uL (ref 0.0–0.2)
Basos: 1 %
EOS (ABSOLUTE): 0.2 10*3/uL (ref 0.0–0.4)
Eos: 4 %
Hematocrit: 43.1 % (ref 37.5–51.0)
Hemoglobin: 14.9 g/dL (ref 13.0–17.7)
Immature Grans (Abs): 0 10*3/uL (ref 0.0–0.1)
Immature Granulocytes: 1 %
Lymphocytes Absolute: 1.4 10*3/uL (ref 0.7–3.1)
Lymphs: 23 %
MCH: 31.8 pg (ref 26.6–33.0)
MCHC: 34.6 g/dL (ref 31.5–35.7)
MCV: 92 fL (ref 79–97)
Monocytes Absolute: 0.4 10*3/uL (ref 0.1–0.9)
Monocytes: 7 %
Neutrophils Absolute: 4.1 10*3/uL (ref 1.4–7.0)
Neutrophils: 64 %
Platelets: 238 10*3/uL (ref 150–450)
RBC: 4.68 x10E6/uL (ref 4.14–5.80)
RDW: 11.7 % (ref 11.6–15.4)
WBC: 6.3 10*3/uL (ref 3.4–10.8)

## 2023-04-28 LAB — COMPREHENSIVE METABOLIC PANEL
ALT: 21 IU/L (ref 0–44)
AST: 17 IU/L (ref 0–40)
Alkaline Phosphatase: 92 IU/L (ref 44–121)
BUN: 11 mg/dL (ref 6–24)
Bilirubin Total: 0.3 mg/dL (ref 0.0–1.2)
CO2: 26 mmol/L (ref 20–29)
Calcium: 9.2 mg/dL (ref 8.7–10.2)
Chloride: 100 mmol/L (ref 96–106)
Creatinine, Ser: 1.21 mg/dL (ref 0.76–1.27)
Globulin, Total: 2.1 g/dL (ref 1.5–4.5)
Glucose: 95 mg/dL (ref 70–99)
Potassium: 4.1 mmol/L (ref 3.5–5.2)
Sodium: 140 mmol/L (ref 134–144)
Total Protein: 6.6 g/dL (ref 6.0–8.5)
eGFR: 73 mL/min/{1.73_m2} (ref >59)

## 2023-04-28 LAB — 10-HYDROXYCARBAZEPINE

## 2023-04-28 NOTE — Telephone Encounter (Signed)
[facility] is taking this patient 

## 2023-04-29 DIAGNOSIS — H6123 Impacted cerumen, bilateral: Secondary | ICD-10-CM | POA: Diagnosis not present

## 2023-04-29 DIAGNOSIS — Z9181 History of falling: Secondary | ICD-10-CM | POA: Diagnosis not present

## 2023-04-29 DIAGNOSIS — G4700 Insomnia, unspecified: Secondary | ICD-10-CM | POA: Diagnosis not present

## 2023-04-29 DIAGNOSIS — R569 Unspecified convulsions: Secondary | ICD-10-CM | POA: Diagnosis not present

## 2023-04-29 DIAGNOSIS — F32A Depression, unspecified: Secondary | ICD-10-CM | POA: Diagnosis not present

## 2023-04-29 DIAGNOSIS — H903 Sensorineural hearing loss, bilateral: Secondary | ICD-10-CM | POA: Diagnosis not present

## 2023-04-29 DIAGNOSIS — M1990 Unspecified osteoarthritis, unspecified site: Secondary | ICD-10-CM | POA: Diagnosis not present

## 2023-04-29 DIAGNOSIS — I69359 Hemiplegia and hemiparesis following cerebral infarction affecting unspecified side: Secondary | ICD-10-CM | POA: Diagnosis not present

## 2023-04-29 DIAGNOSIS — F411 Generalized anxiety disorder: Secondary | ICD-10-CM | POA: Diagnosis not present

## 2023-04-29 DIAGNOSIS — G4733 Obstructive sleep apnea (adult) (pediatric): Secondary | ICD-10-CM | POA: Diagnosis not present

## 2023-04-29 DIAGNOSIS — Z7902 Long term (current) use of antithrombotics/antiplatelets: Secondary | ICD-10-CM | POA: Diagnosis not present

## 2023-04-29 DIAGNOSIS — I4729 Other ventricular tachycardia: Secondary | ICD-10-CM | POA: Diagnosis not present

## 2023-04-29 DIAGNOSIS — E039 Hypothyroidism, unspecified: Secondary | ICD-10-CM | POA: Diagnosis not present

## 2023-04-29 DIAGNOSIS — J309 Allergic rhinitis, unspecified: Secondary | ICD-10-CM | POA: Diagnosis not present

## 2023-04-29 DIAGNOSIS — C719 Malignant neoplasm of brain, unspecified: Secondary | ICD-10-CM | POA: Diagnosis not present

## 2023-05-02 ENCOUNTER — Telehealth: Payer: Self-pay

## 2023-05-02 NOTE — Telephone Encounter (Signed)
-----   Message from Glean Salvo sent at 05/02/2023  5:52 AM EDT ----- Please call, blood work is normal.  Trileptal is within therapeutic range.  Please let me know if any questions.  I had ordered 72-hour ambulatory EEG, please have him let us know if he is not contacted.  Thanks, Maralyn Sago

## 2023-05-02 NOTE — Telephone Encounter (Signed)
Called and relayed results to patient, Pt had no questions at this time but was encouraged to call back if questions arise. He states home health is coming to work with him tomorrow but he does not know anything about the EEG yet. There is a note from 07.24.24 about [facility] paperwork being faxed, so I told him to let us know by end of week if he had not heard anything.

## 2023-05-03 DIAGNOSIS — F32A Depression, unspecified: Secondary | ICD-10-CM | POA: Diagnosis not present

## 2023-05-03 DIAGNOSIS — G4733 Obstructive sleep apnea (adult) (pediatric): Secondary | ICD-10-CM | POA: Diagnosis not present

## 2023-05-03 DIAGNOSIS — I4729 Other ventricular tachycardia: Secondary | ICD-10-CM | POA: Diagnosis not present

## 2023-05-03 DIAGNOSIS — I69359 Hemiplegia and hemiparesis following cerebral infarction affecting unspecified side: Secondary | ICD-10-CM | POA: Diagnosis not present

## 2023-05-03 DIAGNOSIS — Z9181 History of falling: Secondary | ICD-10-CM | POA: Diagnosis not present

## 2023-05-03 DIAGNOSIS — G4700 Insomnia, unspecified: Secondary | ICD-10-CM | POA: Diagnosis not present

## 2023-05-03 DIAGNOSIS — R569 Unspecified convulsions: Secondary | ICD-10-CM | POA: Diagnosis not present

## 2023-05-03 DIAGNOSIS — J309 Allergic rhinitis, unspecified: Secondary | ICD-10-CM | POA: Diagnosis not present

## 2023-05-03 DIAGNOSIS — H6123 Impacted cerumen, bilateral: Secondary | ICD-10-CM | POA: Diagnosis not present

## 2023-05-03 DIAGNOSIS — H903 Sensorineural hearing loss, bilateral: Secondary | ICD-10-CM | POA: Diagnosis not present

## 2023-05-03 DIAGNOSIS — M1990 Unspecified osteoarthritis, unspecified site: Secondary | ICD-10-CM | POA: Diagnosis not present

## 2023-05-03 DIAGNOSIS — C719 Malignant neoplasm of brain, unspecified: Secondary | ICD-10-CM | POA: Diagnosis not present

## 2023-05-03 DIAGNOSIS — F411 Generalized anxiety disorder: Secondary | ICD-10-CM | POA: Diagnosis not present

## 2023-05-03 DIAGNOSIS — Z7902 Long term (current) use of antithrombotics/antiplatelets: Secondary | ICD-10-CM | POA: Diagnosis not present

## 2023-05-03 DIAGNOSIS — E039 Hypothyroidism, unspecified: Secondary | ICD-10-CM | POA: Diagnosis not present

## 2023-05-07 DIAGNOSIS — G40909 Epilepsy, unspecified, not intractable, without status epilepticus: Secondary | ICD-10-CM | POA: Diagnosis not present

## 2023-05-07 DIAGNOSIS — R519 Headache, unspecified: Secondary | ICD-10-CM | POA: Diagnosis not present

## 2023-05-07 DIAGNOSIS — Z982 Presence of cerebrospinal fluid drainage device: Secondary | ICD-10-CM | POA: Diagnosis not present

## 2023-05-07 DIAGNOSIS — I10 Essential (primary) hypertension: Secondary | ICD-10-CM | POA: Diagnosis not present

## 2023-05-07 DIAGNOSIS — E039 Hypothyroidism, unspecified: Secondary | ICD-10-CM | POA: Diagnosis not present

## 2023-05-07 DIAGNOSIS — Z79899 Other long term (current) drug therapy: Secondary | ICD-10-CM | POA: Diagnosis not present

## 2023-05-07 DIAGNOSIS — R569 Unspecified convulsions: Secondary | ICD-10-CM | POA: Diagnosis not present

## 2023-05-09 DIAGNOSIS — I69359 Hemiplegia and hemiparesis following cerebral infarction affecting unspecified side: Secondary | ICD-10-CM | POA: Diagnosis not present

## 2023-05-09 DIAGNOSIS — H903 Sensorineural hearing loss, bilateral: Secondary | ICD-10-CM | POA: Diagnosis not present

## 2023-05-09 DIAGNOSIS — J309 Allergic rhinitis, unspecified: Secondary | ICD-10-CM | POA: Diagnosis not present

## 2023-05-09 DIAGNOSIS — I4729 Other ventricular tachycardia: Secondary | ICD-10-CM | POA: Diagnosis not present

## 2023-05-09 DIAGNOSIS — H6123 Impacted cerumen, bilateral: Secondary | ICD-10-CM | POA: Diagnosis not present

## 2023-05-09 DIAGNOSIS — E039 Hypothyroidism, unspecified: Secondary | ICD-10-CM | POA: Diagnosis not present

## 2023-05-09 DIAGNOSIS — C719 Malignant neoplasm of brain, unspecified: Secondary | ICD-10-CM | POA: Diagnosis not present

## 2023-05-09 DIAGNOSIS — Z7902 Long term (current) use of antithrombotics/antiplatelets: Secondary | ICD-10-CM | POA: Diagnosis not present

## 2023-05-09 DIAGNOSIS — M1990 Unspecified osteoarthritis, unspecified site: Secondary | ICD-10-CM | POA: Diagnosis not present

## 2023-05-09 DIAGNOSIS — F32A Depression, unspecified: Secondary | ICD-10-CM | POA: Diagnosis not present

## 2023-05-09 DIAGNOSIS — F411 Generalized anxiety disorder: Secondary | ICD-10-CM | POA: Diagnosis not present

## 2023-05-09 DIAGNOSIS — R569 Unspecified convulsions: Secondary | ICD-10-CM | POA: Diagnosis not present

## 2023-05-09 DIAGNOSIS — G4733 Obstructive sleep apnea (adult) (pediatric): Secondary | ICD-10-CM | POA: Diagnosis not present

## 2023-05-09 DIAGNOSIS — Z9181 History of falling: Secondary | ICD-10-CM | POA: Diagnosis not present

## 2023-05-09 DIAGNOSIS — G4700 Insomnia, unspecified: Secondary | ICD-10-CM | POA: Diagnosis not present

## 2023-05-11 DIAGNOSIS — R569 Unspecified convulsions: Secondary | ICD-10-CM | POA: Diagnosis not present

## 2023-05-13 DIAGNOSIS — H6123 Impacted cerumen, bilateral: Secondary | ICD-10-CM | POA: Diagnosis not present

## 2023-05-13 DIAGNOSIS — C719 Malignant neoplasm of brain, unspecified: Secondary | ICD-10-CM | POA: Diagnosis not present

## 2023-05-13 DIAGNOSIS — R569 Unspecified convulsions: Secondary | ICD-10-CM | POA: Diagnosis not present

## 2023-05-13 DIAGNOSIS — M1990 Unspecified osteoarthritis, unspecified site: Secondary | ICD-10-CM | POA: Diagnosis not present

## 2023-05-13 DIAGNOSIS — I4729 Other ventricular tachycardia: Secondary | ICD-10-CM | POA: Diagnosis not present

## 2023-05-13 DIAGNOSIS — E039 Hypothyroidism, unspecified: Secondary | ICD-10-CM | POA: Diagnosis not present

## 2023-05-13 DIAGNOSIS — F32A Depression, unspecified: Secondary | ICD-10-CM | POA: Diagnosis not present

## 2023-05-13 DIAGNOSIS — J309 Allergic rhinitis, unspecified: Secondary | ICD-10-CM | POA: Diagnosis not present

## 2023-05-13 DIAGNOSIS — Z9181 History of falling: Secondary | ICD-10-CM | POA: Diagnosis not present

## 2023-05-13 DIAGNOSIS — I69359 Hemiplegia and hemiparesis following cerebral infarction affecting unspecified side: Secondary | ICD-10-CM | POA: Diagnosis not present

## 2023-05-13 DIAGNOSIS — H903 Sensorineural hearing loss, bilateral: Secondary | ICD-10-CM | POA: Diagnosis not present

## 2023-05-13 DIAGNOSIS — G4733 Obstructive sleep apnea (adult) (pediatric): Secondary | ICD-10-CM | POA: Diagnosis not present

## 2023-05-13 DIAGNOSIS — Z7902 Long term (current) use of antithrombotics/antiplatelets: Secondary | ICD-10-CM | POA: Diagnosis not present

## 2023-05-13 DIAGNOSIS — G4700 Insomnia, unspecified: Secondary | ICD-10-CM | POA: Diagnosis not present

## 2023-05-13 DIAGNOSIS — F411 Generalized anxiety disorder: Secondary | ICD-10-CM | POA: Diagnosis not present

## 2023-05-17 DIAGNOSIS — F32A Depression, unspecified: Secondary | ICD-10-CM | POA: Diagnosis not present

## 2023-05-17 DIAGNOSIS — M1990 Unspecified osteoarthritis, unspecified site: Secondary | ICD-10-CM | POA: Diagnosis not present

## 2023-05-17 DIAGNOSIS — G4733 Obstructive sleep apnea (adult) (pediatric): Secondary | ICD-10-CM | POA: Diagnosis not present

## 2023-05-17 DIAGNOSIS — Z9181 History of falling: Secondary | ICD-10-CM | POA: Diagnosis not present

## 2023-05-17 DIAGNOSIS — H903 Sensorineural hearing loss, bilateral: Secondary | ICD-10-CM | POA: Diagnosis not present

## 2023-05-17 DIAGNOSIS — G4700 Insomnia, unspecified: Secondary | ICD-10-CM | POA: Diagnosis not present

## 2023-05-17 DIAGNOSIS — I69359 Hemiplegia and hemiparesis following cerebral infarction affecting unspecified side: Secondary | ICD-10-CM | POA: Diagnosis not present

## 2023-05-17 DIAGNOSIS — E039 Hypothyroidism, unspecified: Secondary | ICD-10-CM | POA: Diagnosis not present

## 2023-05-17 DIAGNOSIS — C719 Malignant neoplasm of brain, unspecified: Secondary | ICD-10-CM | POA: Diagnosis not present

## 2023-05-17 DIAGNOSIS — I4729 Other ventricular tachycardia: Secondary | ICD-10-CM | POA: Diagnosis not present

## 2023-05-17 DIAGNOSIS — F411 Generalized anxiety disorder: Secondary | ICD-10-CM | POA: Diagnosis not present

## 2023-05-17 DIAGNOSIS — H6123 Impacted cerumen, bilateral: Secondary | ICD-10-CM | POA: Diagnosis not present

## 2023-05-17 DIAGNOSIS — J309 Allergic rhinitis, unspecified: Secondary | ICD-10-CM | POA: Diagnosis not present

## 2023-05-17 DIAGNOSIS — R569 Unspecified convulsions: Secondary | ICD-10-CM | POA: Diagnosis not present

## 2023-05-17 DIAGNOSIS — Z7902 Long term (current) use of antithrombotics/antiplatelets: Secondary | ICD-10-CM | POA: Diagnosis not present

## 2023-05-24 DIAGNOSIS — F411 Generalized anxiety disorder: Secondary | ICD-10-CM | POA: Diagnosis not present

## 2023-05-24 DIAGNOSIS — F39 Unspecified mood [affective] disorder: Secondary | ICD-10-CM | POA: Diagnosis not present

## 2023-05-24 DIAGNOSIS — F4010 Social phobia, unspecified: Secondary | ICD-10-CM | POA: Diagnosis not present

## 2023-05-26 DIAGNOSIS — H532 Diplopia: Secondary | ICD-10-CM | POA: Diagnosis not present

## 2023-05-27 DIAGNOSIS — F411 Generalized anxiety disorder: Secondary | ICD-10-CM | POA: Diagnosis not present

## 2023-05-27 DIAGNOSIS — E039 Hypothyroidism, unspecified: Secondary | ICD-10-CM | POA: Diagnosis not present

## 2023-05-27 DIAGNOSIS — J309 Allergic rhinitis, unspecified: Secondary | ICD-10-CM | POA: Diagnosis not present

## 2023-05-27 DIAGNOSIS — H903 Sensorineural hearing loss, bilateral: Secondary | ICD-10-CM | POA: Diagnosis not present

## 2023-05-27 DIAGNOSIS — F32A Depression, unspecified: Secondary | ICD-10-CM | POA: Diagnosis not present

## 2023-05-27 DIAGNOSIS — Z7902 Long term (current) use of antithrombotics/antiplatelets: Secondary | ICD-10-CM | POA: Diagnosis not present

## 2023-05-27 DIAGNOSIS — G4733 Obstructive sleep apnea (adult) (pediatric): Secondary | ICD-10-CM | POA: Diagnosis not present

## 2023-05-27 DIAGNOSIS — R569 Unspecified convulsions: Secondary | ICD-10-CM | POA: Diagnosis not present

## 2023-05-27 DIAGNOSIS — I4729 Other ventricular tachycardia: Secondary | ICD-10-CM | POA: Diagnosis not present

## 2023-05-27 DIAGNOSIS — Z9181 History of falling: Secondary | ICD-10-CM | POA: Diagnosis not present

## 2023-05-27 DIAGNOSIS — H6123 Impacted cerumen, bilateral: Secondary | ICD-10-CM | POA: Diagnosis not present

## 2023-05-27 DIAGNOSIS — G4700 Insomnia, unspecified: Secondary | ICD-10-CM | POA: Diagnosis not present

## 2023-05-27 DIAGNOSIS — M1990 Unspecified osteoarthritis, unspecified site: Secondary | ICD-10-CM | POA: Diagnosis not present

## 2023-05-27 DIAGNOSIS — C719 Malignant neoplasm of brain, unspecified: Secondary | ICD-10-CM | POA: Diagnosis not present

## 2023-05-27 DIAGNOSIS — I69359 Hemiplegia and hemiparesis following cerebral infarction affecting unspecified side: Secondary | ICD-10-CM | POA: Diagnosis not present

## 2023-05-29 DIAGNOSIS — H6123 Impacted cerumen, bilateral: Secondary | ICD-10-CM | POA: Diagnosis not present

## 2023-05-29 DIAGNOSIS — I69359 Hemiplegia and hemiparesis following cerebral infarction affecting unspecified side: Secondary | ICD-10-CM | POA: Diagnosis not present

## 2023-05-29 DIAGNOSIS — Z9181 History of falling: Secondary | ICD-10-CM | POA: Diagnosis not present

## 2023-05-29 DIAGNOSIS — F411 Generalized anxiety disorder: Secondary | ICD-10-CM | POA: Diagnosis not present

## 2023-05-29 DIAGNOSIS — J309 Allergic rhinitis, unspecified: Secondary | ICD-10-CM | POA: Diagnosis not present

## 2023-05-29 DIAGNOSIS — F32A Depression, unspecified: Secondary | ICD-10-CM | POA: Diagnosis not present

## 2023-05-29 DIAGNOSIS — G4733 Obstructive sleep apnea (adult) (pediatric): Secondary | ICD-10-CM | POA: Diagnosis not present

## 2023-05-29 DIAGNOSIS — Z7902 Long term (current) use of antithrombotics/antiplatelets: Secondary | ICD-10-CM | POA: Diagnosis not present

## 2023-05-29 DIAGNOSIS — I4729 Other ventricular tachycardia: Secondary | ICD-10-CM | POA: Diagnosis not present

## 2023-05-29 DIAGNOSIS — C719 Malignant neoplasm of brain, unspecified: Secondary | ICD-10-CM | POA: Diagnosis not present

## 2023-05-29 DIAGNOSIS — H903 Sensorineural hearing loss, bilateral: Secondary | ICD-10-CM | POA: Diagnosis not present

## 2023-05-29 DIAGNOSIS — M1990 Unspecified osteoarthritis, unspecified site: Secondary | ICD-10-CM | POA: Diagnosis not present

## 2023-05-29 DIAGNOSIS — G4700 Insomnia, unspecified: Secondary | ICD-10-CM | POA: Diagnosis not present

## 2023-05-29 DIAGNOSIS — E039 Hypothyroidism, unspecified: Secondary | ICD-10-CM | POA: Diagnosis not present

## 2023-05-29 DIAGNOSIS — R569 Unspecified convulsions: Secondary | ICD-10-CM | POA: Diagnosis not present

## 2023-06-01 DIAGNOSIS — C719 Malignant neoplasm of brain, unspecified: Secondary | ICD-10-CM | POA: Diagnosis not present

## 2023-06-01 DIAGNOSIS — F32A Depression, unspecified: Secondary | ICD-10-CM | POA: Diagnosis not present

## 2023-06-01 DIAGNOSIS — J309 Allergic rhinitis, unspecified: Secondary | ICD-10-CM | POA: Diagnosis not present

## 2023-06-01 DIAGNOSIS — G4700 Insomnia, unspecified: Secondary | ICD-10-CM | POA: Diagnosis not present

## 2023-06-01 DIAGNOSIS — H6123 Impacted cerumen, bilateral: Secondary | ICD-10-CM | POA: Diagnosis not present

## 2023-06-01 DIAGNOSIS — I4729 Other ventricular tachycardia: Secondary | ICD-10-CM | POA: Diagnosis not present

## 2023-06-01 DIAGNOSIS — G4733 Obstructive sleep apnea (adult) (pediatric): Secondary | ICD-10-CM | POA: Diagnosis not present

## 2023-06-01 DIAGNOSIS — M1990 Unspecified osteoarthritis, unspecified site: Secondary | ICD-10-CM | POA: Diagnosis not present

## 2023-06-01 DIAGNOSIS — Z9181 History of falling: Secondary | ICD-10-CM | POA: Diagnosis not present

## 2023-06-01 DIAGNOSIS — R569 Unspecified convulsions: Secondary | ICD-10-CM | POA: Diagnosis not present

## 2023-06-01 DIAGNOSIS — H903 Sensorineural hearing loss, bilateral: Secondary | ICD-10-CM | POA: Diagnosis not present

## 2023-06-01 DIAGNOSIS — I69359 Hemiplegia and hemiparesis following cerebral infarction affecting unspecified side: Secondary | ICD-10-CM | POA: Diagnosis not present

## 2023-06-01 DIAGNOSIS — E039 Hypothyroidism, unspecified: Secondary | ICD-10-CM | POA: Diagnosis not present

## 2023-06-01 DIAGNOSIS — Z7902 Long term (current) use of antithrombotics/antiplatelets: Secondary | ICD-10-CM | POA: Diagnosis not present

## 2023-06-01 DIAGNOSIS — F411 Generalized anxiety disorder: Secondary | ICD-10-CM | POA: Diagnosis not present

## 2023-06-08 DIAGNOSIS — Z9181 History of falling: Secondary | ICD-10-CM | POA: Diagnosis not present

## 2023-06-08 DIAGNOSIS — G4700 Insomnia, unspecified: Secondary | ICD-10-CM | POA: Diagnosis not present

## 2023-06-08 DIAGNOSIS — I4729 Other ventricular tachycardia: Secondary | ICD-10-CM | POA: Diagnosis not present

## 2023-06-08 DIAGNOSIS — M1990 Unspecified osteoarthritis, unspecified site: Secondary | ICD-10-CM | POA: Diagnosis not present

## 2023-06-08 DIAGNOSIS — I69359 Hemiplegia and hemiparesis following cerebral infarction affecting unspecified side: Secondary | ICD-10-CM | POA: Diagnosis not present

## 2023-06-08 DIAGNOSIS — J309 Allergic rhinitis, unspecified: Secondary | ICD-10-CM | POA: Diagnosis not present

## 2023-06-08 DIAGNOSIS — G4733 Obstructive sleep apnea (adult) (pediatric): Secondary | ICD-10-CM | POA: Diagnosis not present

## 2023-06-08 DIAGNOSIS — C719 Malignant neoplasm of brain, unspecified: Secondary | ICD-10-CM | POA: Diagnosis not present

## 2023-06-08 DIAGNOSIS — H6123 Impacted cerumen, bilateral: Secondary | ICD-10-CM | POA: Diagnosis not present

## 2023-06-08 DIAGNOSIS — Z7902 Long term (current) use of antithrombotics/antiplatelets: Secondary | ICD-10-CM | POA: Diagnosis not present

## 2023-06-08 DIAGNOSIS — H903 Sensorineural hearing loss, bilateral: Secondary | ICD-10-CM | POA: Diagnosis not present

## 2023-06-08 DIAGNOSIS — R569 Unspecified convulsions: Secondary | ICD-10-CM | POA: Diagnosis not present

## 2023-06-08 DIAGNOSIS — E039 Hypothyroidism, unspecified: Secondary | ICD-10-CM | POA: Diagnosis not present

## 2023-06-08 DIAGNOSIS — F411 Generalized anxiety disorder: Secondary | ICD-10-CM | POA: Diagnosis not present

## 2023-06-08 DIAGNOSIS — F32A Depression, unspecified: Secondary | ICD-10-CM | POA: Diagnosis not present

## 2023-06-15 ENCOUNTER — Telehealth: Payer: Self-pay

## 2023-06-15 NOTE — Telephone Encounter (Signed)
Orders faxed to [facility]

## 2023-06-16 DIAGNOSIS — K080 Exfoliation of teeth due to systemic causes: Secondary | ICD-10-CM | POA: Diagnosis not present

## 2023-06-16 NOTE — Telephone Encounter (Signed)
2 separate orders faxed to [facility] at [phone]

## 2023-06-17 DIAGNOSIS — H903 Sensorineural hearing loss, bilateral: Secondary | ICD-10-CM | POA: Diagnosis not present

## 2023-06-17 DIAGNOSIS — C719 Malignant neoplasm of brain, unspecified: Secondary | ICD-10-CM | POA: Diagnosis not present

## 2023-06-17 DIAGNOSIS — R569 Unspecified convulsions: Secondary | ICD-10-CM | POA: Diagnosis not present

## 2023-06-17 DIAGNOSIS — I4729 Other ventricular tachycardia: Secondary | ICD-10-CM | POA: Diagnosis not present

## 2023-06-17 DIAGNOSIS — G4733 Obstructive sleep apnea (adult) (pediatric): Secondary | ICD-10-CM | POA: Diagnosis not present

## 2023-06-17 DIAGNOSIS — F411 Generalized anxiety disorder: Secondary | ICD-10-CM | POA: Diagnosis not present

## 2023-06-17 DIAGNOSIS — H6123 Impacted cerumen, bilateral: Secondary | ICD-10-CM | POA: Diagnosis not present

## 2023-06-17 DIAGNOSIS — M1990 Unspecified osteoarthritis, unspecified site: Secondary | ICD-10-CM | POA: Diagnosis not present

## 2023-06-17 DIAGNOSIS — Z9181 History of falling: Secondary | ICD-10-CM | POA: Diagnosis not present

## 2023-06-17 DIAGNOSIS — F32A Depression, unspecified: Secondary | ICD-10-CM | POA: Diagnosis not present

## 2023-06-17 DIAGNOSIS — J309 Allergic rhinitis, unspecified: Secondary | ICD-10-CM | POA: Diagnosis not present

## 2023-06-17 DIAGNOSIS — G4700 Insomnia, unspecified: Secondary | ICD-10-CM | POA: Diagnosis not present

## 2023-06-17 DIAGNOSIS — E039 Hypothyroidism, unspecified: Secondary | ICD-10-CM | POA: Diagnosis not present

## 2023-06-17 DIAGNOSIS — Z7902 Long term (current) use of antithrombotics/antiplatelets: Secondary | ICD-10-CM | POA: Diagnosis not present

## 2023-06-17 DIAGNOSIS — I69359 Hemiplegia and hemiparesis following cerebral infarction affecting unspecified side: Secondary | ICD-10-CM | POA: Diagnosis not present

## 2023-06-23 DIAGNOSIS — Z8673 Personal history of transient ischemic attack (TIA), and cerebral infarction without residual deficits: Secondary | ICD-10-CM | POA: Diagnosis not present

## 2023-06-23 DIAGNOSIS — I69352 Hemiplegia and hemiparesis following cerebral infarction affecting left dominant side: Secondary | ICD-10-CM | POA: Diagnosis not present

## 2023-06-23 DIAGNOSIS — G40909 Epilepsy, unspecified, not intractable, without status epilepticus: Secondary | ICD-10-CM | POA: Diagnosis not present

## 2023-06-23 DIAGNOSIS — Z1331 Encounter for screening for depression: Secondary | ICD-10-CM | POA: Diagnosis not present

## 2023-07-12 DIAGNOSIS — R269 Unspecified abnormalities of gait and mobility: Secondary | ICD-10-CM | POA: Diagnosis not present

## 2023-07-12 DIAGNOSIS — H5509 Other forms of nystagmus: Secondary | ICD-10-CM | POA: Diagnosis not present

## 2023-07-12 DIAGNOSIS — H532 Diplopia: Secondary | ICD-10-CM | POA: Diagnosis not present

## 2023-07-12 DIAGNOSIS — H5022 Vertical strabismus, left eye: Secondary | ICD-10-CM | POA: Diagnosis not present

## 2023-08-16 DIAGNOSIS — F411 Generalized anxiety disorder: Secondary | ICD-10-CM | POA: Diagnosis not present

## 2023-08-16 DIAGNOSIS — F4010 Social phobia, unspecified: Secondary | ICD-10-CM | POA: Diagnosis not present

## 2023-08-16 DIAGNOSIS — F39 Unspecified mood [affective] disorder: Secondary | ICD-10-CM | POA: Diagnosis not present

## 2023-08-18 ENCOUNTER — Other Ambulatory Visit: Payer: Self-pay

## 2023-08-18 ENCOUNTER — Inpatient Hospital Stay (HOSPITAL_COMMUNITY)
Admission: EM | Admit: 2023-08-18 | Discharge: 2023-08-23 | DRG: 065 | Disposition: A | Discharge status: Skilled Nursing Facility | Payer: Medicare Other | Attending: Internal Medicine | Admitting: Internal Medicine

## 2023-08-18 ENCOUNTER — Emergency Department (HOSPITAL_COMMUNITY): Payer: Medicare Other

## 2023-08-18 DIAGNOSIS — Z91018 Allergy to other foods: Secondary | ICD-10-CM | POA: Diagnosis not present

## 2023-08-18 DIAGNOSIS — H903 Sensorineural hearing loss, bilateral: Secondary | ICD-10-CM | POA: Diagnosis not present

## 2023-08-18 DIAGNOSIS — Z7401 Bed confinement status: Secondary | ICD-10-CM | POA: Diagnosis not present

## 2023-08-18 DIAGNOSIS — E538 Deficiency of other specified B group vitamins: Secondary | ICD-10-CM | POA: Diagnosis present

## 2023-08-18 DIAGNOSIS — Z79899 Other long term (current) drug therapy: Secondary | ICD-10-CM | POA: Diagnosis not present

## 2023-08-18 DIAGNOSIS — R471 Dysarthria and anarthria: Secondary | ICD-10-CM | POA: Diagnosis not present

## 2023-08-18 DIAGNOSIS — I10 Essential (primary) hypertension: Secondary | ICD-10-CM | POA: Diagnosis not present

## 2023-08-18 DIAGNOSIS — E785 Hyperlipidemia, unspecified: Secondary | ICD-10-CM | POA: Diagnosis present

## 2023-08-18 DIAGNOSIS — Z9621 Cochlear implant status: Secondary | ICD-10-CM | POA: Diagnosis not present

## 2023-08-18 DIAGNOSIS — Z85841 Personal history of malignant neoplasm of brain: Secondary | ICD-10-CM | POA: Diagnosis not present

## 2023-08-18 DIAGNOSIS — Z808 Family history of malignant neoplasm of other organs or systems: Secondary | ICD-10-CM

## 2023-08-18 DIAGNOSIS — Z882 Allergy status to sulfonamides status: Secondary | ICD-10-CM | POA: Diagnosis not present

## 2023-08-18 DIAGNOSIS — I4729 Other ventricular tachycardia: Secondary | ICD-10-CM | POA: Diagnosis present

## 2023-08-18 DIAGNOSIS — G9389 Other specified disorders of brain: Secondary | ICD-10-CM | POA: Diagnosis not present

## 2023-08-18 DIAGNOSIS — I639 Cerebral infarction, unspecified: Principal | ICD-10-CM | POA: Diagnosis present

## 2023-08-18 DIAGNOSIS — Z602 Problems related to living alone: Secondary | ICD-10-CM | POA: Diagnosis present

## 2023-08-18 DIAGNOSIS — Z91041 Radiographic dye allergy status: Secondary | ICD-10-CM | POA: Diagnosis not present

## 2023-08-18 DIAGNOSIS — Z7982 Long term (current) use of aspirin: Secondary | ICD-10-CM | POA: Diagnosis not present

## 2023-08-18 DIAGNOSIS — Z982 Presence of cerebrospinal fluid drainage device: Secondary | ICD-10-CM

## 2023-08-18 DIAGNOSIS — M5416 Radiculopathy, lumbar region: Secondary | ICD-10-CM | POA: Diagnosis not present

## 2023-08-18 DIAGNOSIS — G40909 Epilepsy, unspecified, not intractable, without status epilepticus: Secondary | ICD-10-CM | POA: Diagnosis not present

## 2023-08-18 DIAGNOSIS — Z888 Allergy status to other drugs, medicaments and biological substances status: Secondary | ICD-10-CM | POA: Diagnosis not present

## 2023-08-18 DIAGNOSIS — R41841 Cognitive communication deficit: Secondary | ICD-10-CM | POA: Diagnosis not present

## 2023-08-18 DIAGNOSIS — R29711 NIHSS score 11: Secondary | ICD-10-CM | POA: Diagnosis present

## 2023-08-18 DIAGNOSIS — F445 Conversion disorder with seizures or convulsions: Secondary | ICD-10-CM | POA: Diagnosis not present

## 2023-08-18 DIAGNOSIS — G459 Transient cerebral ischemic attack, unspecified: Secondary | ICD-10-CM | POA: Diagnosis not present

## 2023-08-18 DIAGNOSIS — E039 Hypothyroidism, unspecified: Secondary | ICD-10-CM | POA: Diagnosis not present

## 2023-08-18 DIAGNOSIS — Z7902 Long term (current) use of antithrombotics/antiplatelets: Secondary | ICD-10-CM

## 2023-08-18 DIAGNOSIS — F419 Anxiety disorder, unspecified: Secondary | ICD-10-CM | POA: Diagnosis present

## 2023-08-18 DIAGNOSIS — R2681 Unsteadiness on feet: Secondary | ICD-10-CM | POA: Diagnosis not present

## 2023-08-18 DIAGNOSIS — R4182 Altered mental status, unspecified: Secondary | ICD-10-CM | POA: Diagnosis not present

## 2023-08-18 DIAGNOSIS — Z923 Personal history of irradiation: Secondary | ICD-10-CM | POA: Diagnosis not present

## 2023-08-18 DIAGNOSIS — M6281 Muscle weakness (generalized): Secondary | ICD-10-CM | POA: Diagnosis not present

## 2023-08-18 DIAGNOSIS — R29898 Other symptoms and signs involving the musculoskeletal system: Secondary | ICD-10-CM | POA: Diagnosis not present

## 2023-08-18 DIAGNOSIS — I6330 Cerebral infarction due to thrombosis of unspecified cerebral artery: Secondary | ICD-10-CM | POA: Diagnosis not present

## 2023-08-18 DIAGNOSIS — R9431 Abnormal electrocardiogram [ECG] [EKG]: Secondary | ICD-10-CM | POA: Diagnosis not present

## 2023-08-18 DIAGNOSIS — R569 Unspecified convulsions: Secondary | ICD-10-CM | POA: Diagnosis not present

## 2023-08-18 DIAGNOSIS — F411 Generalized anxiety disorder: Secondary | ICD-10-CM | POA: Diagnosis not present

## 2023-08-18 DIAGNOSIS — G4733 Obstructive sleep apnea (adult) (pediatric): Secondary | ICD-10-CM | POA: Diagnosis not present

## 2023-08-18 DIAGNOSIS — I6381 Other cerebral infarction due to occlusion or stenosis of small artery: Principal | ICD-10-CM | POA: Diagnosis present

## 2023-08-18 DIAGNOSIS — I6529 Occlusion and stenosis of unspecified carotid artery: Secondary | ICD-10-CM | POA: Diagnosis not present

## 2023-08-18 DIAGNOSIS — I69351 Hemiplegia and hemiparesis following cerebral infarction affecting right dominant side: Secondary | ICD-10-CM | POA: Diagnosis not present

## 2023-08-18 DIAGNOSIS — M6259 Muscle wasting and atrophy, not elsewhere classified, multiple sites: Secondary | ICD-10-CM | POA: Diagnosis not present

## 2023-08-18 DIAGNOSIS — R2689 Other abnormalities of gait and mobility: Secondary | ICD-10-CM | POA: Diagnosis not present

## 2023-08-18 DIAGNOSIS — Z741 Need for assistance with personal care: Secondary | ICD-10-CM | POA: Diagnosis not present

## 2023-08-18 DIAGNOSIS — I700 Atherosclerosis of aorta: Secondary | ICD-10-CM | POA: Diagnosis not present

## 2023-08-18 DIAGNOSIS — R296 Repeated falls: Secondary | ICD-10-CM | POA: Diagnosis present

## 2023-08-18 DIAGNOSIS — R1312 Dysphagia, oropharyngeal phase: Secondary | ICD-10-CM | POA: Diagnosis not present

## 2023-08-18 DIAGNOSIS — M6289 Other specified disorders of muscle: Secondary | ICD-10-CM | POA: Diagnosis not present

## 2023-08-18 DIAGNOSIS — R29818 Other symptoms and signs involving the nervous system: Secondary | ICD-10-CM | POA: Diagnosis not present

## 2023-08-18 DIAGNOSIS — I63311 Cerebral infarction due to thrombosis of right middle cerebral artery: Secondary | ICD-10-CM | POA: Diagnosis not present

## 2023-08-18 DIAGNOSIS — R1310 Dysphagia, unspecified: Secondary | ICD-10-CM | POA: Diagnosis not present

## 2023-08-18 LAB — URINALYSIS, ROUTINE W REFLEX MICROSCOPIC
Bilirubin Urine: NEGATIVE
Glucose, UA: NEGATIVE mg/dL
Hgb urine dipstick: NEGATIVE
Ketones, ur: NEGATIVE mg/dL
Leukocytes,Ua: NEGATIVE
Nitrite: NEGATIVE
Protein, ur: NEGATIVE mg/dL
Specific Gravity, Urine: 1.011 (ref 1.005–1.030)
pH: 7 (ref 5.0–8.0)

## 2023-08-18 LAB — COMPREHENSIVE METABOLIC PANEL
ALT: 16 U/L (ref 0–44)
AST: 16 U/L (ref 15–41)
Albumin: 3.9 g/dL (ref 3.5–5.0)
Alkaline Phosphatase: 74 U/L (ref 38–126)
Anion gap: 6 (ref 5–15)
BUN: 9 mg/dL (ref 6–20)
CO2: 29 mmol/L (ref 22–32)
Calcium: 8.8 mg/dL — ABNORMAL LOW (ref 8.9–10.3)
Chloride: 100 mmol/L (ref 98–111)
Creatinine, Ser: 1.22 mg/dL (ref 0.61–1.24)
GFR, Estimated: >60 mL/min (ref >60)
Glucose, Bld: 96 mg/dL (ref 70–99)
Potassium: 4 mmol/L (ref 3.5–5.1)
Sodium: 135 mmol/L (ref 135–145)
Total Bilirubin: 0.6 mg/dL (ref <1.2)
Total Protein: 6.4 g/dL — ABNORMAL LOW (ref 6.5–8.1)

## 2023-08-18 LAB — RAPID URINE DRUG SCREEN, HOSP PERFORMED
Amphetamines: NOT DETECTED
Barbiturates: NOT DETECTED
Benzodiazepines: POSITIVE — AB
Cocaine: NOT DETECTED
Opiates: NOT DETECTED
Tetrahydrocannabinol: NOT DETECTED

## 2023-08-18 LAB — CBC
HCT: 48.1 % (ref 39.0–52.0)
Hemoglobin: 15.9 g/dL (ref 13.0–17.0)
MCH: 30.4 pg (ref 26.0–34.0)
MCHC: 33.1 g/dL (ref 30.0–36.0)
MCV: 92 fL (ref 80.0–100.0)
Platelets: 203 10*3/uL (ref 150–400)
RBC: 5.23 MIL/uL (ref 4.22–5.81)
RDW: 11.9 % (ref 11.5–15.5)
WBC: 5 10*3/uL (ref 4.0–10.5)
nRBC: 0 % (ref 0.0–0.2)

## 2023-08-18 LAB — PROTIME-INR
INR: 1 (ref 0.8–1.2)
Prothrombin Time: 13.1 s (ref 11.4–15.2)

## 2023-08-18 LAB — DIFFERENTIAL
Abs Immature Granulocytes: 0.02 10*3/uL (ref 0.00–0.07)
Basophils Absolute: 0.1 10*3/uL (ref 0.0–0.1)
Basophils Relative: 1 %
Eosinophils Absolute: 0.2 10*3/uL (ref 0.0–0.5)
Eosinophils Relative: 3 %
Immature Granulocytes: 0 %
Lymphocytes Relative: 24 %
Lymphs Abs: 1.2 10*3/uL (ref 0.7–4.0)
Monocytes Absolute: 0.4 10*3/uL (ref 0.1–1.0)
Monocytes Relative: 8 %
Neutro Abs: 3.2 10*3/uL (ref 1.7–7.7)
Neutrophils Relative %: 64 %

## 2023-08-18 LAB — I-STAT CHEM 8, ED
BUN: 10 mg/dL (ref 6–20)
Calcium, Ion: 1.09 mmol/L — ABNORMAL LOW (ref 1.15–1.40)
Chloride: 102 mmol/L (ref 98–111)
Creatinine, Ser: 1.1 mg/dL (ref 0.61–1.24)
Glucose, Bld: 93 mg/dL (ref 70–99)
HCT: 46 % (ref 39.0–52.0)
Hemoglobin: 15.6 g/dL (ref 13.0–17.0)
Potassium: 4.1 mmol/L (ref 3.5–5.1)
Sodium: 138 mmol/L (ref 135–145)
TCO2: 25 mmol/L (ref 22–32)

## 2023-08-18 LAB — ETHANOL: Alcohol, Ethyl (B): <10 mg/dL (ref <10)

## 2023-08-18 LAB — APTT: aPTT: 27 s (ref 24–36)

## 2023-08-18 LAB — CBG MONITORING, ED: Glucose-Capillary: 87 mg/dL (ref 70–99)

## 2023-08-18 MED ORDER — DIPHENHYDRAMINE HCL 25 MG PO CAPS: 50.0000 mg | ORAL_CAPSULE | Freq: Once | ORAL | Status: AC

## 2023-08-18 MED ORDER — METOPROLOL SUCCINATE ER 50 MG PO TB24
50.0000 mg | ORAL_TABLET | Freq: Every day | ORAL | Status: DC
  Administered 2023-08-18 – 2023-08-23 (×5): 50 mg via ORAL
  Filled 2023-08-18 (×6): qty 1

## 2023-08-18 MED ORDER — ACETAMINOPHEN 325 MG PO TABS
650.0000 mg | ORAL_TABLET | Freq: Four times a day (QID) | ORAL | Status: DC
  Administered 2023-08-18 – 2023-08-21 (×9): 650 mg via ORAL
  Filled 2023-08-18 (×9): qty 2

## 2023-08-18 MED ORDER — ASPIRIN 81 MG PO TBEC
81.0000 mg | DELAYED_RELEASE_TABLET | Freq: Every day | ORAL | Status: DC
  Administered 2023-08-18 – 2023-08-23 (×6): 81 mg via ORAL
  Filled 2023-08-18 (×6): qty 1

## 2023-08-18 MED ORDER — OXCARBAZEPINE 300 MG/5ML PO SUSP
300.0000 mg | Freq: Two times a day (BID) | ORAL | Status: DC
  Administered 2023-08-18 – 2023-08-23 (×10): 300 mg via ORAL
  Filled 2023-08-18 (×14): qty 5

## 2023-08-18 MED ORDER — METHYLPREDNISOLONE SODIUM SUCC 40 MG IJ SOLR
40.0000 mg | Freq: Once | INTRAMUSCULAR | Status: AC
Start: 2023-08-18 — End: 2023-08-18
  Administered 2023-08-18: 40 mg via INTRAVENOUS
  Filled 2023-08-18: qty 1

## 2023-08-18 MED ORDER — DIPHENHYDRAMINE HCL 50 MG/ML IJ SOLN
50.0000 mg | Freq: Once | INTRAMUSCULAR | Status: AC
Start: 2023-08-18 — End: 2023-08-19
  Administered 2023-08-19: 50 mg via INTRAVENOUS
  Filled 2023-08-18: qty 1

## 2023-08-18 MED ORDER — LIDOCAINE 5 % EX PTCH
2.0000 | MEDICATED_PATCH | CUTANEOUS | Status: DC
  Administered 2023-08-18 – 2023-08-22 (×5): 2 via TRANSDERMAL
  Filled 2023-08-18 (×6): qty 2

## 2023-08-18 MED ORDER — CLOPIDOGREL BISULFATE 75 MG PO TABS
75.0000 mg | ORAL_TABLET | Freq: Every day | ORAL | Status: DC
  Administered 2023-08-18 – 2023-08-23 (×6): 75 mg via ORAL
  Filled 2023-08-18 (×6): qty 1

## 2023-08-18 MED ORDER — ALPRAZOLAM 0.5 MG PO TABS
0.5000 mg | ORAL_TABLET | Freq: Two times a day (BID) | ORAL | Status: DC
  Administered 2023-08-18 – 2023-08-23 (×10): 0.5 mg via ORAL
  Filled 2023-08-18 (×10): qty 1

## 2023-08-18 MED ORDER — ENOXAPARIN SODIUM 40 MG/0.4ML IJ SOSY
40.0000 mg | PREFILLED_SYRINGE | INTRAMUSCULAR | Status: DC
Start: 2023-08-18 — End: 2023-08-23
  Administered 2023-08-18 – 2023-08-22 (×5): 40 mg via SUBCUTANEOUS
  Filled 2023-08-18 (×5): qty 0.4

## 2023-08-18 NOTE — H&P (Addendum)
Date: 08/18/2023               Patient Name:  [name] Love MRN: [id number]  DOB: Jun 05, 1971 Age / Sex: 52 y.o., male   PCP: Ailene Ravel, MD         Medical Service: Internal Medicine Teaching Service         Attending Physician: Dr. Earl Lagos, MD      First Contact: Dr. Manuela Neptune, MD Pager [phone]    Second Contact: Dr. Morene Crocker, MD Pager [phone]         After Hours (After 5p/  First Contact Pager: [phone]  weekends / holidays): Second Contact Pager: [phone]   SUBJECTIVE   Chief Complaint:  Chief Complaint  Patient presents with   Code Stroke   History of Present Illness:   [name] Love is a 52 y.o. 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 He states that the entire left side from his head to his toes is weak and feels dead. This started in the morning when he attempted to get up from his bed to go to the bathroom. He did fell. He did hit his head. At baseline, he has difficulty walking but that this weakness is new. He did note that he had a transient episode where he could not walk his dog about four days ago too. This is associated with numbness that he states feels like it is dead. This feeling is also present on the left side of his face. States that the blurriness of vision he usually has is worse on the left. He also feels lightheaded although denies the room spinning when he changes positions. He has not had any changes in speech. These symptoms are not the same as the ones that he has when he has seizures as when he has seizures he will have generalized weakness and slurriness in his speech.  Denies having had any recent stressors, sees a psychiatrist for his anxiety and is given  alpazolam 0.5mg  BID. He used to take a higher dose but this is decreased. Denies any recent illness, fevers, nausea, vomiting, abdominal pain or diarrhea.   Last dose of plavix was yesterday AM.  Past Medical History:  Diagnosis Date   Abnormal gait    Acute arterial ischemic stroke, vertebrobasilar, thalamic (HCC) 06/08/2018   Acute CVA (cerebrovascular accident) (HCC) 06/07/2018   Allergic rhinitis    Anxiety    Arthritis 11/05/2013   Atypical chest pain 11/30/2017   Bilateral arm weakness    Bilateral hearing loss 07/28/2020   Brain cancer (HCC)    Cerebral thrombosis with cerebral infarction 04/17/2019   Cochlear implant in place with multiple channels 05/15/2020   CVA (cerebrovascular accident) (HCC) 04/18/2019   Depression    Diplopia    Dyspnea on exertion 11/30/2017   Ependymoma of brain [facility])    Episode of change in speech 04/17/2019   Generalized anxiety disorder 11/28/2018   Headache 11/05/2013   Hearing loss, sensorineural    Hemiparesis and alteration of sensations as late effects of stroke (HCC) 09/04/2018   History of stroke 04/01/2020   Hypothyroidism    Insomnia    Left leg weakness    Low vitamin B12 level    Memory loss 11/05/2013  Myalgia 04/18/2017   OSA on CPAP 11/28/2018   Recurrent falls    Refusal of blood transfusions as patient is Jehovah's Witness 04/01/2020   Seizure (HCC)    Seizures (HCC)    Sleeping difficulty 11/05/2013   Stroke-like symptoms    Ventricular ectopy 06/04/2020   Weakness 04/16/2019   Weakness generalized 04/17/2019   Past Surgical History:  Procedure Laterality Date   BRAIN SURGERY     HERNIA REPAIR     SHUNT REVISION     Allergies: Allergies as of 08/18/2023 - Review Complete 08/18/2023  Allergen Reaction Noted   Baclofen  06/28/2017   Seroquel [quetiapine] Other (See Comments) 02/02/2018   Sulfa antibiotics Other (See Comments) 04/22/2015   Coconut flavor Rash and Other (See Comments) 02/02/2018   Contrast media [iodinated contrast  media] Rash 04/22/2015   Current Meds  Medication Sig   ALPRAZolam (XANAX) 0.5 MG tablet Take 0.5 mg by mouth 2 (two) times daily.   clopidogrel (PLAVIX) 75 MG tablet TAKE 1 TABLET(75 MG) BY MOUTH DAILY   metoprolol succinate (TOPROL-XL) 50 MG 24 hr tablet Take 50 mg by mouth daily. Take with or immediately following a meal.   Oxcarbazepine (TRILEPTAL) 300 MG tablet Take 1 tablet (300 mg total) by mouth 2 (two) times daily.    Family History:   Family History  Problem Relation Age of Onset   Brain cancer Mother    High blood pressure Mother     Social:  Lives alone with his dog. His cousing is HPA. Independent JXB:JYNWG Hamrick, MD  Substances No alcohol, drug use, or tobacco use ever  Review of Systems: A complete ROS was negative except as per HPI.   OBJECTIVE:   Physical Exam: Blood pressure (!) 130/90, pulse 87, temperature 98.4 F (36.9 C), resp. rate 12, height 5\' 8"  (1.727 m), weight 83 kg, SpO2 100%.  Constitutional: well-appearing sitting in bed in no acute distress HENT: normocephalic atraumatic, mucous membranes moist Eyes: conjunctiva non-erythematous Neck: supple Cardiovascular: regular rate and rhythm, no m/r/g Pulmonary/Chest: normal work of breathing on room air, lungs clear to auscultation bilaterally Abdominal: soft, non-tender, non-distended MSK: normal bulk and tone Neurological:  AA0 x3 PERRLA, EOMI, left sided nystagmus  Altered sensation on V2 region but V1 and V3 are intact  Hearing with cochlear implants at baseline  No dysarthria, no tongue elevations, palate elevation equal bilaterally Abnormal FTN test  RUE 5/5 LUE with variable drift. When not paying attention or when asked a question about his headache he will left left hand and touch his head, will state that he has an abnormal sensation on the head while scratching his head with his left arm, fully elevated. When given the command to stretch his arm there is variability in the length he  can stretch his arm. LLE 2/5  RLE 5/5 Grip is diminished on the left.  Sensation diminished on the left leg  Labs: CBC    Component Value Date/Time   WBC 5.0 08/18/2023 1105   RBC 5.23 08/18/2023 1105   HGB 15.6 08/18/2023 1113   HGB 14.9 04/27/2023 1552   HCT 46.0 08/18/2023 1113   HCT 43.1 04/27/2023 1552   PLT 203 08/18/2023 1105   PLT 238 04/27/2023 1552   MCV 92.0 08/18/2023 1105   MCV 92 04/27/2023 1552   MCV 92 11/06/2014 1751   MCH 30.4 08/18/2023 1105   MCHC 33.1 08/18/2023 1105   RDW 11.9 08/18/2023 1105   RDW 11.7 04/27/2023 1552  RDW 12.8 11/06/2014 1751   LYMPHSABS 1.2 08/18/2023 1105   LYMPHSABS 1.4 04/27/2023 1552   LYMPHSABS 1.4 03/12/2014 1140   MONOABS 0.4 08/18/2023 1105   MONOABS 0.5 03/12/2014 1140   EOSABS 0.2 08/18/2023 1105   EOSABS 0.2 04/27/2023 1552   EOSABS 0.2 03/12/2014 1140   BASOSABS 0.1 08/18/2023 1105   BASOSABS 0.1 04/27/2023 1552   BASOSABS 0.0 03/12/2014 1140     CMP     Component Value Date/Time   NA 138 08/18/2023 1113   NA 140 04/27/2023 1552   NA 142 11/06/2014 1751   K 4.1 08/18/2023 1113   K 3.7 11/06/2014 1751   CL 102 08/18/2023 1113   CL 106 11/06/2014 1751   CO2 29 08/18/2023 1105   CO2 29 11/06/2014 1751   GLUCOSE 93 08/18/2023 1113   GLUCOSE 91 11/06/2014 1751   BUN 10 08/18/2023 1113   BUN 11 04/27/2023 1552   BUN 14 11/06/2014 1751   CREATININE 1.10 08/18/2023 1113   CREATININE 1.11 11/06/2014 1751   CALCIUM 8.8 (L) 08/18/2023 1105   CALCIUM 8.8 11/06/2014 1751   PROT 6.4 (L) 08/18/2023 1105   PROT 6.6 04/27/2023 1552   PROT 6.9 11/06/2014 1751   ALBUMIN 3.9 08/18/2023 1105   ALBUMIN 4.5 04/27/2023 1552   ALBUMIN 3.8 11/06/2014 1751   AST 16 08/18/2023 1105   AST 33 11/06/2014 1751   ALT 16 08/18/2023 1105   ALT 55 11/06/2014 1751   ALKPHOS 74 08/18/2023 1105   ALKPHOS 89 11/06/2014 1751   BILITOT 0.6 08/18/2023 1105   BILITOT 0.3 04/27/2023 1552   BILITOT 0.4 11/06/2014 1751   GFRNONAA >60  08/18/2023 1105   GFRNONAA >60 11/06/2014 1751   GFRNONAA >60 03/12/2014 1140   GFRAA >60 04/17/2019 0810   GFRAA >60 11/06/2014 1751   GFRAA >60 03/12/2014 1140   Imaging:  CT HEAD CODE STROKE WO CONTRAST  Result Date: 08/18/2023 CLINICAL DATA:  Code stroke. Neuro deficit, acute, stroke suspected. EXAM: CT HEAD WITHOUT CONTRAST TECHNIQUE: Contiguous axial images were obtained from the base of the skull through the vertex without intravenous contrast. RADIATION DOSE REDUCTION: This exam was performed according to the departmental dose-optimization program which includes automated exposure control, adjustment of the mA and/or kV according to patient size and/or use of iterative reconstruction technique. COMPARISON:  Head CT 02/27/2021. FINDINGS: Brain: Within limits of metal artifact from bilateral cochlear implants, no acute hemorrhage. Unchanged thin, chronic bilateral frontoparietal subdural hematomas. Unchanged scattered parenchymal calcifications, greatest in the posterior fossa and medial aspects of the bilateral occipital lobes. Old perforator infarcts in the bilateral thalami posterior limb of the left internal capsule. No new loss of gray-white differentiation. Unchanged right posterior approach ventricular shunt catheter with tip traversing the body of the left lateral ventricle. Stable size and configuration of the shunted ventricles. Stable postoperative changes of suboccipital craniotomy and resection of a posterior fossa mass. Vascular: No hyperdense vessel or unexpected calcification. Skull: Old right frontal right parietal burr holes. Prior suboccipital craniectomy. Sinuses/Orbits: No acute findings. Other: None. ASPECTS [facility] Stroke Program Early CT Score) - Ganglionic level infarction (caudate, lentiform nuclei, internal capsule, insula, M1-M3 cortex): 7 - Supraganglionic infarction (M4-M6 cortex): 3 Total score (0-10 with 10 being normal): 10 IMPRESSION: 1. No acute intracranial  hemorrhage or evidence of acute infarct. ASPECT score is 10. 2. Unchanged thin, chronic bilateral frontoparietal subdural hematomas. 3. Unchanged right posterior approach ventricular shunt catheter with stable size and configuration of the shunted ventricles.  4. Stable postoperative changes of suboccipital craniotomy and resection of a posterior fossa mass. Code stroke imaging results were communicated on 08/18/2023 at 11:26 am to provider Dr. Otelia Limes via secure text paging. Electronically Signed   By: Orvan Falconer M.D.   On: 08/18/2023 11:26    EKG: NSR   ASSESSMENT & PLAN:   Assessment & Plan by Problem: Principal Problem:   Cerebral infarction (HCC)  [name] Love is a 52 y.o. 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   #Cerebral Infarction  #Left sided weakness  Neuro exam is not consistent with an easy localizable lesion. Reports weakness on the left side, but on exam when distracted is able to fully more the left arm with normal range of motion and hold it against gravity. Has some weakness when pushing against on the left upper extremity. Also weak on left leg. Altered sensation on V2 and LLE. Tender to palpation on the L5 region in the back and having some muscles spasms. Query weakness related to sciatica as he is able to move his lower extremity. On chart review he has been evaluated in the past for left lower extremity weakness. An EMG was done in 2018 and showed no signed of sciatica. Has history of anxiety and sees psychiatry. Could be psychogenic. His ativan dose was decreased. CTA is negative for signs of an acute stroke. Subdural hematomas are stable and unchanged. No acute bleed noted. Appreciate Neuro.  -He is out of TTP  window  -MRI tomorrow AM, has VP shunt that per pt will need to be reset by Dr. Mikal Plane within 24 hour of MRI.  -cw Plavix  -ASA -SLP eval  -PT/OT -Check UDS and UA  - HgbA1c, fasting lipid panel - Frequent neuro checks - Echocardiogram - CTA head and neck - Telemetry monitoring -Lidocaine Patch on back -Tylenol for pain  #Hx of Seizures  Does not seem post-ictal, alert and oriented symptoms are not what he usually experiences after a seizure. Has been compliant with meds.  -Cw oxcarbazepine 300mg  BID  #Hx of Ventricular Ectopy  Cw metoprolol succinate 50 mg daily   #Anxiety  -cw xanax 0.5 mg BID   Diet: Normal VTE: Lovenox Code: Full  Prior to Admission Living Arrangement:  Home Anticipated Discharge Location: Home Barriers to Discharge: medical work up  Dispo: Admit patient to Observation with expected length of stay less than 2 midnights.  Signed: [facility]  Internal Medicine Resident, PGY-1 [facility]  Pager: [phone]  08/18/2023, 6:37 PM

## 2023-08-18 NOTE — ED Provider Notes (Addendum)
[name] Provider Note   CSN: [id number] Arrival date & time: 08/18/23  1109  An emergency department physician performed an initial assessment on this suspected stroke patient at 60.  History  Chief Complaint  Patient presents with   Code Stroke    [name] Love is a 52 y.o. male.  52 year old male with past medical history of epilepsy as well as hypertension presenting to the emergency department today with inability to walk and left-sided weakness.  The patient states that he went to bed last night at 8:30 PM and was in his normal state of health.  He states that when he woke up this morning that he tried to walk and fell down.  He has not been able to walk since this morning.  He is not on any blood thinners but does take Plavix.  He denies any other acute symptoms at this time.  Denies any headache.        Home Medications Prior to Admission medications   Medication Sig Start Date End Date Taking? Authorizing Provider  ALPRAZolam Prudy Feeler) 0.5 MG tablet Take 0.5 mg by mouth 2 (two) times daily. 05/30/18  Yes [provider]  clopidogrel (PLAVIX) 75 MG tablet TAKE 1 TABLET(75 MG) BY MOUTH DAILY 11/24/22  Yes Georgeanna Lea, MD  metoprolol succinate (TOPROL-XL) 50 MG 24 hr tablet Take 50 mg by mouth daily. Take with or immediately following a meal.   Yes [provider]  Oxcarbazepine (TRILEPTAL) 300 MG tablet Take 1 tablet (300 mg total) by mouth 2 (two) times daily. 02/22/23 09/20/23 Yes Camara, Amalia Hailey, MD  fluticasone (FLONASE) 50 MCG/ACT nasal spray Place 2 sprays into both nostrils 2 (two) times daily as needed for allergies or rhinitis. Patient not taking: Reported on 08/18/2023    [provider]      Allergies    Baclofen, Seroquel [quetiapine], Sulfa antibiotics, Coconut flavor, and Contrast media [iodinated contrast media]    Review of Systems   Review of Systems  Neurological:  Positive  for weakness and numbness.  All other systems reviewed and are negative.   Physical Exam Updated Vital Signs BP (!) 127/99   Pulse 69   Temp 98.3 F (36.8 C) (Oral)   Resp 20   Ht 5\' 8"  (1.727 m)   Wt 83 kg   SpO2 100%   BMI 27.83 kg/m  Physical Exam Vitals and nursing note reviewed.   Gen: NAD Eyes: PERRL, EOMI HEENT: no oropharyngeal swelling Neck: trachea midline Resp: clear to auscultation bilaterally Card: RRR, no murmurs, rubs, or gallops Abd: nontender, nondistended Extremities: no calf tenderness, no edema Vascular: 2+ radial pulses bilaterally, 2+ DP pulses bilaterally Neuro: See NIH stroke scale Skin: no rashes    ED Results / Procedures / Treatments   Labs (all labs ordered are listed, but only abnormal results are displayed) Labs Reviewed  COMPREHENSIVE METABOLIC PANEL - Abnormal; Notable for the following components:      Result Value   Calcium 8.8 (*)    Total Protein 6.4 (*)    All other components within normal limits  RAPID URINE DRUG SCREEN, HOSP PERFORMED - Abnormal; Notable for the following components:   Benzodiazepines POSITIVE (*)    All other components within normal limits  I-STAT CHEM 8, ED - Abnormal; Notable for the following components:   Calcium, Ion 1.09 (*)    All other components within normal limits  ETHANOL  PROTIME-INR  APTT  CBC  DIFFERENTIAL  URINALYSIS, ROUTINE W REFLEX MICROSCOPIC  CBG MONITORING, ED    EKG None  Radiology CT HEAD CODE STROKE WO CONTRAST  Result Date: 08/18/2023 CLINICAL DATA:  Code stroke. Neuro deficit, acute, stroke suspected. EXAM: CT HEAD WITHOUT CONTRAST TECHNIQUE: Contiguous axial images were obtained from the base of the skull through the vertex without intravenous contrast. RADIATION DOSE REDUCTION: This exam was performed according to the departmental dose-optimization program which includes automated exposure control, adjustment of the mA and/or kV according to patient size and/or use  of iterative reconstruction technique. COMPARISON:  Head CT 02/27/2021. FINDINGS: Brain: Within limits of metal artifact from bilateral cochlear implants, no acute hemorrhage. Unchanged thin, chronic bilateral frontoparietal subdural hematomas. Unchanged scattered parenchymal calcifications, greatest in the posterior fossa and medial aspects of the bilateral occipital lobes. Old perforator infarcts in the bilateral thalami posterior limb of the left internal capsule. No new loss of gray-white differentiation. Unchanged right posterior approach ventricular shunt catheter with tip traversing the body of the left lateral ventricle. Stable size and configuration of the shunted ventricles. Stable postoperative changes of suboccipital craniotomy and resection of a posterior fossa mass. Vascular: No hyperdense vessel or unexpected calcification. Skull: Old right frontal right parietal burr holes. Prior suboccipital craniectomy. Sinuses/Orbits: No acute findings. Other: None. ASPECTS [facility] Stroke Program Early CT Score) - Ganglionic level infarction (caudate, lentiform nuclei, internal capsule, insula, M1-M3 cortex): 7 - Supraganglionic infarction (M4-M6 cortex): 3 Total score (0-10 with 10 being normal): 10 IMPRESSION: 1. No acute intracranial hemorrhage or evidence of acute infarct. ASPECT score is 10. 2. Unchanged thin, chronic bilateral frontoparietal subdural hematomas. 3. Unchanged right posterior approach ventricular shunt catheter with stable size and configuration of the shunted ventricles. 4. Stable postoperative changes of suboccipital craniotomy and resection of a posterior fossa mass. Code stroke imaging results were communicated on 08/18/2023 at 11:26 am to provider Dr. Otelia Limes via secure text paging. Electronically Signed   By: Orvan Falconer M.D.   On: 08/18/2023 11:26    Procedures Procedures    Medications Ordered in ED Medications - No data to display  ED Course/ Medical Decision Making/  A&P                   NIH Stroke Scale: 9              Medical Decision Making 52 year old male with past medical history of epilepsy and hypertension presenting to the emergency department today with new left-sided deficits and inability to ambulate.  He is outside the window for TNK at this time but could potentially be a candidate for thrombectomy.  A stroke alert was initiated from the field.  The patient's labs are largely unremarkable.  CT scan is negative.  Speak with stroke team they recommended admission for further stroke workup and MRI.  Critical care time 35 minutes including reassessments, review of old records, coordination of care with neurology, coronation care with internal medicine service  Amount and/or Complexity of Data Reviewed Labs: ordered. Radiology: ordered.  Risk Decision regarding hospitalization.           Final Clinical Impression(s) / ED Diagnoses Final diagnoses:  Cerebrovascular accident (CVA), unspecified mechanism [facility])    Rx / DC Orders ED Discharge Orders     None         Durwin Glaze, MD 08/18/23 1524    Durwin Glaze, MD 08/18/23 [phone]

## 2023-08-18 NOTE — Consult Note (Addendum)
NEUROLOGY CONSULT NOTE   Date of service: August 18, 2023 Patient Name: [name] Love MRN:  [id number] DOB:  Jan 06, 1971 Chief Complaint: "Code stroke" Requesting Provider: Durwin Glaze, MD  History of Present Illness  [name] Love is a 52 y.o. 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[facility]), Episode of change in speech (04/17/2019), Generalized anxiety disorder (11/28/2018), Headache (11/05/2013), Hearing loss, sensorineural, Hemiparesis and alteration of sensations as late effects of stroke (HCC) (09/04/2018), History of stroke (04/01/2020), Hypothyroidism, Insomnia, Left leg weakness, Low vitamin B12 level, Memory loss (11/05/2013), Myalgia (04/18/2017), OSA on CPAP (11/28/2018), Recurrent falls, Refusal of blood transfusions as patient is Jehovah's Witness (04/01/2020), Seizure (HCC), Seizures (HCC), Sleeping difficulty (11/05/2013), Stroke-like symptoms, Ventricular ectopy (06/04/2020), Weakness (04/16/2019), and Weakness generalized (04/17/2019). who presents with  left side weakness via EMS from home. Code stroke activated by EMS. LKW ~2100 when he went to bed. He states he was in his normal state of health prior to going to bed last night. This morning he tried to get up out of bed and fell due to increased left sided weakness. He endorses chronic left sided weakness due to prior stroke, but today it was significantly worse. NIHSS 11.   CT head was limited by beam artifact due to  bilateral cochlear implants, but with no acute process. ASPECTS 10.   He does not give a definitive response when asked if he thinks that his presentation today is due to postictal weakness from a recurrent seizure, or whether he thinks his weakness is due to a stroke. Rapid improvement of his weakness was noted while in CT, but he was still not back to his baseline. Some inconsistencies were noted on exam, most notably that the patient gesticulated normally with his left arm and hand during speech, without asymmetry compared to the RUE, but when attention was drawn to the affected limb, it became flaccid and he was unable to lift it antigravity.   Home medications include Trileptal 300 mg BID. He states that up until about 2 months ago he was poorly compliant with his AED, but that after he had a recurrent seizure, he started taking Trileptal as directed without missing doses.    LKW: 2100 on 11/13 Modified rankin score: 2-Slight disability-UNABLE to perform all activities but does not need assistance IV Thrombolysis:  No not a stroke  EVT:  No LVO   ROS  Comprehensive ROS performed and pertinent positives documented in HPI   Past History   Past Medical History:  Diagnosis Date   Abnormal gait    Acute arterial ischemic stroke, vertebrobasilar, thalamic (HCC) 06/08/2018   Acute CVA (cerebrovascular accident) (HCC) 06/07/2018   Allergic rhinitis    Anxiety    Arthritis 11/05/2013   Atypical chest pain 11/30/2017   Bilateral arm weakness    Bilateral hearing loss 07/28/2020   Brain cancer (HCC)    Cerebral thrombosis with cerebral infarction 04/17/2019   Cochlear implant in place with multiple channels 05/15/2020   CVA (cerebrovascular accident) (HCC) 04/18/2019   Depression    Diplopia    Dyspnea on exertion 11/30/2017   Ependymoma of brain (  HCC)    Episode of change in speech 04/17/2019   Generalized anxiety disorder 11/28/2018   Headache 11/05/2013   Hearing loss, sensorineural     Hemiparesis and alteration of sensations as late effects of stroke (HCC) 09/04/2018   History of stroke 04/01/2020   Hypothyroidism    Insomnia    Left leg weakness    Low vitamin B12 level    Memory loss 11/05/2013   Myalgia 04/18/2017   OSA on CPAP 11/28/2018   Recurrent falls    Refusal of blood transfusions as patient is Jehovah's Witness 04/01/2020   Seizure (HCC)    Seizures (HCC)    Sleeping difficulty 11/05/2013   Stroke-like symptoms    Ventricular ectopy 06/04/2020   Weakness 04/16/2019   Weakness generalized 04/17/2019    Past Surgical History:  Procedure Laterality Date   BRAIN SURGERY     HERNIA REPAIR     SHUNT REVISION      Family History: Family History  Problem Relation Age of Onset   Brain cancer Mother    High blood pressure Mother     Social History  reports that he has never smoked. He has never used smokeless tobacco. He reports that he does not drink alcohol and does not use drugs.  Allergies  Allergen Reactions   Baclofen     Urinary retension   Seroquel [Quetiapine] Other (See Comments)    Weight gain   Sulfa Antibiotics Other (See Comments)    Reaction ??   Coconut Flavor Rash and Other (See Comments)    ANYTHING COCONUT- allergic to this   Contrast Media [Iodinated Contrast Media] Rash    Medications  No current facility-administered medications for this encounter.  Current Outpatient Medications:    ALPRAZolam (XANAX) 0.5 MG tablet, Take 0.5 mg by mouth 2 (two) times daily., Disp: , Rfl: 2   clopidogrel (PLAVIX) 75 MG tablet, TAKE 1 TABLET(75 MG) BY MOUTH DAILY (Patient taking differently: Take 75 mg by mouth daily. TAKE 1 TABLET(75 MG) BY MOUTH DAILY), Disp: 90 tablet, Rfl: 3   fluticasone (FLONASE) 50 MCG/ACT nasal spray, Place 2 sprays into both nostrils 2 (two) times daily as needed for allergies or rhinitis., Disp: , Rfl:    metoprolol succinate (TOPROL-XL) 50 MG 24 hr tablet, Take 50 mg by mouth daily. Take with or immediately following a  meal., Disp: , Rfl:    Oxcarbazepine (TRILEPTAL) 300 MG tablet, Take 1 tablet (300 mg total) by mouth 2 (two) times daily., Disp: 60 tablet, Rfl: 6  Vitals  There were no vitals filed for this visit.  There is no height or weight on file to calculate BMI.  Physical Exam   Constitutional: NAD Psych: Affect appropriate to situation.  Eyes: No scleral injection.  HENT: No OP obstruction.  Head: Normocephalic.  Cardiovascular: Normal rate and regular rhythm.  Respiratory: Effort normal, non-labored breathing.  GI: Soft.  No distension. There is no tenderness.  Skin: WDI.   Neurologic Examination   Mental Status:  Level of arousal and orientation to time, place, and person were intact. Speech is slow with mildly increased latencies of verbal responses.  Language including expression, naming, repetition, comprehension were assessed and found intact. Good attention during exam. Memory for recent events was intact.  Cranial Nerves II - XII: II - Left homonymous hemianopsia  III, IV, VI - Eyes are conjugate. Has mild rightward gaze preference, but can cross midline to left with difficulty when attention is drawn to his EOM.  When distracted, his eyes move normally to left and right during interactions with staff.  V - Facial sensation intact bilaterally. VII - Facial movement intact bilaterally in upper and lower quadrants. VIII - HOH requiring some lip reading and used of cochlear implants X - Phonation intact.  XI - Chin turning & shoulder shrug intact bilaterally. XII - Tongue protrudes midline Motor:  RUE 5/5  RLE 4/5 with poor effort/fatigability, holding antigravity without drift initially but then with rapid drift to bed LUE with drift on several trials, but when distracted he gesticulates during conversation with his LUE antigravity LLE falls to bed rapidly with slight resistance to gravity.   Motor Tone - Muscle tone was assessed at the neck and appendages and was normal to RUE  and RLE, but decreased in LLE and distally in LUE. Sensory - decreased FT and cold sensation on left Reflexes - left bicep 3, right bicep 2, left patellar 4, right patellar 3, positive hoffman's sign on left and right Coordination - Past-pointing with BUE. No tremor  Gait and Station - Deferred.  NIHSS  1a Level of Conscious.: 0 1b LOC Questions: 0 1c LOC Commands: 0 2 Best Gaze: 1 3 Visual: 1 4 Facial Palsy: 0 5a Motor Arm - left: 1 5b Motor Arm - Right: 0 6a Motor Leg - Left: 3 6b Motor Leg - Right: 1 7 Limb Ataxia: 1 8 Sensory: 2 9 Best Language: 0 10 Dysarthria: 0 11 Extinct. and Inatten.: 1 TOTAL: 11  Labs/Imaging/Neurodiagnostic studies   CBC:  Recent Labs  Lab 04-Sep-2023 1105 04-Sep-2023 1113  WBC 5.0  --   NEUTROABS 3.2  --   HGB 15.9 15.6  HCT 48.1 46.0  MCV 92.0  --   PLT 203  --     Basic Metabolic Panel:  Lab Results  Component Value Date   NA 138 04-Sep-2023   K 4.1 09/04/23   CO2 26 04/27/2023   GLUCOSE 93 09-04-23   BUN 10 September 04, 2023   CREATININE 1.10 09-04-2023   CALCIUM 9.2 04/27/2023   GFRNONAA >60 02/27/2021   GFRAA >60 04/17/2019    Lipid Panel:  Lab Results  Component Value Date   LDLCALC 64 04/17/2019    HgbA1c:  Lab Results  Component Value Date   HGBA1C 5.3 04/17/2019    Urine Drug Screen: No results found for: "LABOPIA", "COCAINSCRNUR", "LABBENZ", "AMPHETMU", "THCU", "LABBARB"   Alcohol Level No results found for: "ETH"  INR  Lab Results  Component Value Date   INR 1.1 04/16/2019    APTT  Lab Results  Component Value Date   APTT 31 04/16/2019    AED levels:  Lab Results  Component Value Date   LAMOTRIGINE 2.8 02/02/2018   Prior MRI (02/27/21):   1. Severely limited exam due to extensive susceptibility artifact from a left cochlear implant, markedly limiting assessment. 2. No definite acute intracranial abnormality. 3. Postsurgical changes from prior suboccipital craniectomy for tumor resection with  underlying post treatment changes throughout the posterior fossa. Progressive T2/FLAIR signal abnormality within the right cerebellum favored to reflect progressive post treatment effect, although locally recurrent disease is difficult to exclude on this limited exam. Consider short interval follow-up MRI, with and without contrast for further evaluation. 4. Right parietal approach VP shunt catheter in place without hydrocephalus. 5. Underlying atrophy with chronic microvascular ischemic disease with multiple remote lacunar infarcts about the thalami, pons, and cerebellum.   CT Head without contrast(Personally reviewed): No acute process, aspects 10  ASSESSMENT  Zadin Knotts is a 52 y.o. 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 LKW ~2100 when he went to bed. He states he was in his normal state of health prior to going to bed last night. This morning he tried to get up out of bed and rolled out of it onto the floor due to inability to stand. At that time he noticed increased left side weakness and EMS was called. NIHSS 11. - Exam with several inconsistencies that are suggestive of a volitional component to his weakness. Of note, there is no facial droop accompanying his apparent left hemiparesis.  - CT head: No acute intracranial hemorrhage or evidence of acute infarct. ASPECT score is 10. 2. Unchanged thin, chronic bilateral frontoparietal subdural hematomas. Unchanged right posterior approach ventricular shunt catheter with stable size and configuration of the shunted ventricles. Stable postoperative changes of suboccipital craniotomy and resection of a posterior fossa mass. 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  - Clinical presentation is not consistent with LVO.  - DDx for presentation primarily consists of psychogenic pseudostroke versus seizure with Todd's paralysis versus a small acute lacunar infarction.    RECOMMENDATIONS  - Check UDS and UA  - HgbA1c, fasting lipid panel - MRI of the brain without contrast. Will need to notify MRI techs of his cochlear implants. Did have a prior MRI two years ago - at that time only one cochlear implant was in place. Would confirm MRI compatibility of his new cochlear implant (the right implant is new) - Frequent neuro checks - Echocardiogram - CTA head and neck - Prophylactic therapy-Antiplatelet med: Aspirin 81 mg should be added to his home Plavix regimen of 75 mg daily  - Risk factor modification - Telemetry monitoring - PT consult, OT consult, Speech consult - Stroke team to follow - Continue Trileptal 300 mg po BID - Oxcarbazepine level (ordered) - EEG in the AM to assess for possible right hemispheric slowing.   Addendum: - MRI says he has to be evaluated by Neurosurgery the day after his MRI to verify VP shunt settings are appropriate and nothing needs to be adjusted d/t the MRI. They need to know that can be arranged for him before they do MRI.  - Stroke Team to contact Neurosurgery in the AM for management of VP shunt post-MRI ______________________________________________________________________   [name] Love [facility]  I have seen and examined the patient. I have formulated the assessment and recommendations. 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 Exam reveals several inconsistencies suggestive of possible psychogenic pseudostroke.  Seizure with postictal Todd's paralysis versus small lacunar infarct are also on the DDx. Recommendations as above.  Electronically signed: Dr. Caryl Love

## 2023-08-18 NOTE — Evaluation (Signed)
Clinical/Bedside Swallow Evaluation Patient Details  Name: [name] Love MRN: [id number] Date of Birth: [birth year]  Today's Date: 08/18/2023 Time: SLP Start Time (ACUTE ONLY): 1525 SLP Stop Time (ACUTE ONLY): 1550 SLP Time Calculation (min) (ACUTE ONLY): 25 min  Past Medical History:  Past Medical History:  Diagnosis Date   Abnormal gait    Acute arterial ischemic stroke, vertebrobasilar, thalamic (HCC) 06/08/2018   Acute CVA (cerebrovascular accident) (HCC) 06/07/2018   Allergic rhinitis    Anxiety    Arthritis 11/05/2013   Atypical chest pain 11/30/2017   Bilateral arm weakness    Bilateral hearing loss 07/28/2020   Brain cancer (HCC)    Cerebral thrombosis with cerebral infarction 04/17/2019   Cochlear implant in place with multiple channels 05/15/2020   CVA (cerebrovascular accident) (HCC) 04/18/2019   Depression    Diplopia    Dyspnea on exertion 11/30/2017   Ependymoma of brain (HCC)    Episode of change in speech 04/17/2019   Generalized anxiety disorder 11/28/2018   Headache 11/05/2013   Hearing loss, sensorineural    Hemiparesis and alteration of sensations as late effects of stroke (HCC) 09/04/2018   History of stroke 04/01/2020   Hypothyroidism    Insomnia    Left leg weakness    Low vitamin B12 level    Memory loss 11/05/2013   Myalgia 04/18/2017   OSA on CPAP 11/28/2018   Recurrent falls    Refusal of blood transfusions as patient is Jehovah's Witness 04/01/2020   Seizure (HCC)    Seizures (HCC)    Sleeping difficulty 11/05/2013   Stroke-like symptoms    Ventricular ectopy 06/04/2020   Weakness 04/16/2019   Weakness generalized 04/17/2019   Past Surgical History:  Past Surgical History:  Procedure Laterality Date   BRAIN SURGERY     HERNIA REPAIR     SHUNT REVISION     HPI:  [name] Love is a 52 yo male presenting to ED 11/14 with L sided weakness and R gaze preference. CTH negative. MRI Brain pending. PMH includes CVA 2019, anxiety, arthritis, bilateral arm weakness, bilateral  hearing loss with cochlear implants, hypothyroidism, memory loss, seizures, insomnia    Assessment / Plan / Recommendation  Clinical Impression  Pt reports no prior history of dysphagia and no acute concerns. Oral motor exam significant for mild L sided weakness and sensation differences. Observed pt with trials of thin liquids, purees, and solids which were WFL. Pt subjectively reports perceived difficulty of swallowing solids, although no overt s/s of dysphagia or aspiration were noted. Recommend Dys 3 solids with thin liquids. SLP will f/u briefly to assess ability to upgrade diet as clinically indicated. SLP Visit Diagnosis: Dysphagia, unspecified (R13.10)    Aspiration Risk  Mild aspiration risk    Diet Recommendation Dysphagia 3 (Mech soft);Thin liquid    Liquid Administration via: Cup;Straw Medication Administration: Whole meds with liquid Supervision: Patient able to self feed;Full supervision/cueing for compensatory strategies Compensations: Minimize environmental distractions;Slow rate;Small sips/bites Postural Changes: Seated upright at 90 degrees    Other  Recommendations Oral Care Recommendations: Oral care BID    Recommendations for follow up therapy are one component of a multi-disciplinary discharge planning process, led by the attending physician.  Recommendations may be updated based on patient status, additional functional criteria and insurance authorization.  Follow up Recommendations No SLP follow up      Assistance Recommended at Discharge    Functional Status Assessment Patient has had a recent decline in their functional status and demonstrates the  ability to make significant improvements in function in a reasonable and predictable amount of time.  Frequency and Duration min 1 x/week  1 week       Prognosis Prognosis for improved oropharyngeal function: Good Barriers to Reach Goals: Time post onset      Swallow Study   General HPI: [name] Love is a 52 yo  male presenting to ED 11/14 with L sided weakness and R gaze preference. CTH negative. MRI Brain pending. PMH includes CVA 2019, anxiety, arthritis, bilateral arm weakness, bilateral hearing loss with cochlear implants, hypothyroidism, memory loss, seizures, insomnia Type of Study: Bedside Swallow Evaluation Previous Swallow Assessment: none in chart Diet Prior to this Study: NPO Temperature Spikes Noted: No Respiratory Status: Room air History of Recent Intubation: No Behavior/Cognition: Alert;Cooperative;Pleasant mood Oral Cavity Assessment: Within Functional Limits Oral Care Completed by SLP: No Oral Cavity - Dentition: Adequate natural dentition Vision: Functional for self-feeding Self-Feeding Abilities: Able to feed self Patient Positioning: Upright in bed Baseline Vocal Quality: Normal Volitional Cough: Strong Volitional Swallow: Able to elicit    Oral/Motor/Sensory Function Overall Oral Motor/Sensory Function: Within functional limits   Ice Chips Ice chips: Not tested   Thin Liquid Thin Liquid: Within functional limits Presentation: Straw    Nectar Thick Nectar Thick Liquid: Not tested   Honey Thick Honey Thick Liquid: Not tested   Puree Puree: Within functional limits Presentation: Spoon   Solid     Solid: Within functional limits Presentation: Self Fed      Gwynneth Aliment, M.A., CF-SLP Speech Language Pathology, Acute Rehabilitation Services  Secure Chat preferred [phone]  08/18/2023,5:54 PM

## 2023-08-18 NOTE — ED Notes (Signed)
I have reached out to SLP to try and expidite the swallow eval.  If not possible we may have to consider a different approach to meds.

## 2023-08-18 NOTE — ED Notes (Addendum)
Dr. Otelia Limes states, We will hold off on MRI for now. I am trying to get in touch with Dr. Conchita Paris with neurosurgery regarding follow up of VP shunt following MRI.

## 2023-08-18 NOTE — ED Triage Notes (Signed)
Pt BIB [facility] from [facility] as a Code Stroke.  Pt was LKW 2100 last night.  Woke up at 7 am and could not get out of bed. He rolled b/c he couldn't move his left side. He rolled himself out of bed and called his cousin who called 911.  EMS endorsed left sided weakness and complaint of "glassy" vision.   Pt has hx of Brain tumors with lots of radiation treatments.  Also has hx of Epilepsy.  Pt lives at home alone with service dog.  Pt is hearing impaired and has Cochlear implants.

## 2023-08-18 NOTE — Code Documentation (Signed)
Stroke Response Nurse Documentation Code Documentation  Damari Jose is a 52 y.o. male arriving to [facility]  via  [facility]  on 11/14 with past medical hx of seizure, CVA, brain CA. On No antithrombotic. Code stroke was activated by EMS.   Patient from home where he was LKW at 2100 on 11/13 and now complaining of L sided weakness and L eye vision changes. Patient was normal last night before bed and on waking this morning he was weak on the L sided, precluding him from getting out of bed and walking, so he rolled out of bed instead.   Stroke team at the bedside on patient arrival. Labs drawn and patient cleared for CT by Dr. Rhae Hammock. Patient to CT with team. NIHSS 11, see documentation for details and code stroke times. Patient with right gaze preference , left hemianopia, left arm weakness, bilateral leg weakness, left limb ataxia, left decreased sensation, and Sensory  neglect on exam. The following imaging was completed:  CT Head. Patient is not a candidate for IV Thrombolytic due to being out of the treatment window. Patient is not a candidate for IR due to no LVO suspected per MD.   Care Plan: q2 hr NIHSS.   Bedside handoff with ED RN Josh.    Pearlie Oyster  Stroke Response RN

## 2023-08-18 NOTE — ED Notes (Addendum)
ED TO INPATIENT HANDOFF REPORT  ED Nurse Name and Phone #: Anette Guarneri  S Name/Age/Gender [name] Love 52 y.o. male Room/Bed: 038C/038C  Code Status   Code Status: Full Code  Home/SNF/Other Home Patient oriented to: self, place, time, and situation Is this baseline? Yes   Triage Complete: Triage complete  Chief Complaint Cerebral infarction [facility]) [I63.9]  Triage Note [facility] as a Code Stroke.  Pt was LKW 2100 last night.  Woke up at 7 am and could not get out of bed. He rolled b/c he couldn't move his left side. He rolled himself out of bed and called his cousin who called 911.  EMS endorsed left sided weakness and complaint of "glassy" vision.   Pt has hx of Brain tumors with lots of radiation treatments.  Also has hx of Epilepsy.  Pt lives at home alone with service dog.  Pt is hearing impaired and has Cochlear implants.    Allergies Allergies  Allergen Reactions   Baclofen     Urinary retension   Seroquel [Quetiapine] Other (See Comments)    Weight gain   Sulfa Antibiotics Other (See Comments)    Reaction ??   Coconut Flavor Rash and Other (See Comments)    ANYTHING COCONUT- allergic to this   Contrast Media [Iodinated Contrast Media] Rash    Level of Care/Admitting Diagnosis ED Disposition     ED Disposition  Admit   Condition  --   Comment  Hospital Area: [facility] [[id number]]  Level of Care: Telemetry Medical [104]  May place patient in observation at [facility] or [facility] if equivalent level of care is available:: Yes  Covid Evaluation: Asymptomatic - no recent exposure (last 10 days) testing not required  Diagnosis: Cerebral infarction [facility]) [[id number]]  Admitting Physician: Earl Lagos [[id number]]  Attending Physician: Earl Lagos [[id number]]          B Medical/Surgery History Past Medical History:  Diagnosis Date   Abnormal gait    Acute arterial ischemic stroke, vertebrobasilar, thalamic  (HCC) 06/08/2018   Acute CVA (cerebrovascular accident) (HCC) 06/07/2018   Allergic rhinitis    Anxiety    Arthritis 11/05/2013   Atypical chest pain 11/30/2017   Bilateral arm weakness    Bilateral hearing loss 07/28/2020   Brain cancer (HCC)    Cerebral thrombosis with cerebral infarction 04/17/2019   Cochlear implant in place with multiple channels 05/15/2020   CVA (cerebrovascular accident) (HCC) 04/18/2019   Depression    Diplopia    Dyspnea on exertion 11/30/2017   Ependymoma of brain (HCC)    Episode of change in speech 04/17/2019   Generalized anxiety disorder 11/28/2018   Headache 11/05/2013   Hearing loss, sensorineural    Hemiparesis and alteration of sensations as late effects of stroke (HCC) 09/04/2018   History of stroke 04/01/2020   Hypothyroidism    Insomnia    Left leg weakness    Low vitamin B12 level    Memory loss 11/05/2013   Myalgia 04/18/2017   OSA on CPAP 11/28/2018   Recurrent falls    Refusal of blood transfusions as patient is Jehovah's Witness 04/01/2020   Seizure (HCC)    Seizures (HCC)    Sleeping difficulty 11/05/2013   Stroke-like symptoms    Ventricular ectopy 06/04/2020   Weakness 04/16/2019   Weakness generalized 04/17/2019   Past Surgical History:  Procedure Laterality Date   BRAIN SURGERY     HERNIA REPAIR  SHUNT REVISION       A IV Location/Drains/Wounds Patient Lines/Drains/Airways Status     Active Line/Drains/Airways     Name Placement date Placement time Site Days   Peripheral IV 08/18/23 18 G Right Antecubital 08/18/23  1140  Antecubital  less than 1            Intake/Output Last 24 hours No intake or output data in the 24 hours ending 08/18/23 1925  Labs/Imaging Results for orders placed or performed during the hospital encounter of 08/18/23 (from the past 48 hour(s))  Ethanol     Status: None   Collection Time: 08/18/23 11:05 AM  Result Value Ref Range   Alcohol, Ethyl (B) <10 <10 mg/dL    Comment: (NOTE) Lowest detectable  limit for serum alcohol is 10 mg/dL.  For medical purposes only. Performed at [facility], [address] [place], [place] [address]   Protime-INR     Status: None   Collection Time: 08/18/23 11:05 AM  Result Value Ref Range   Prothrombin Time 13.1 11.4 - 15.2 seconds   INR 1.0 0.8 - 1.2    Comment: (NOTE) INR goal varies based on device and disease states. Performed at [facility], [address], [place], [place] [address]   APTT     Status: None   Collection Time: 08/18/23 11:05 AM  Result Value Ref Range   aPTT 27 24 - 36 seconds    Comment: Performed at [facility], [address], [place], [place] [address]  CBC     Status: None   Collection Time: 08/18/23 11:05 AM  Result Value Ref Range   WBC 5.0 4.0 - 10.5 K/uL   RBC 5.23 4.22 - 5.81 MIL/uL   Hemoglobin 15.9 13.0 - 17.0 g/dL   HCT 91.4 78.2 - 95.6 %   MCV 92.0 80.0 - 100.0 fL   MCH 30.4 26.0 - 34.0 pg   MCHC 33.1 30.0 - 36.0 g/dL   RDW 21.3 08.6 - 57.8 %   Platelets 203 150 - 400 K/uL   nRBC 0.0 0.0 - 0.2 %    Comment: Performed at [facility], [address], [place], [place] [address]  Differential     Status: None   Collection Time: 08/18/23 11:05 AM  Result Value Ref Range   Neutrophils Relative % 64 %   Neutro Abs 3.2 1.7 - 7.7 K/uL   Lymphocytes Relative 24 %   Lymphs Abs 1.2 0.7 - 4.0 K/uL   Monocytes Relative 8 %   Monocytes Absolute 0.4 0.1 - 1.0 K/uL   Eosinophils Relative 3 %   Eosinophils Absolute 0.2 0.0 - 0.5 K/uL   Basophils Relative 1 %   Basophils Absolute 0.1 0.0 - 0.1 K/uL   Immature Granulocytes 0 %   Abs Immature Granulocytes 0.02 0.00 - 0.07 K/uL    Comment: Performed at [facility], [address] [place], [place] [address]  Comprehensive metabolic panel     Status: Abnormal   Collection Time: 08/18/23 11:05 AM  Result Value Ref Range   Sodium 135 135 - 145 mmol/L   Potassium 4.0 3.5 - 5.1 mmol/L   Chloride 100 98 - 111 mmol/L   CO2 29 22 -  32 mmol/L   Glucose, Bld 96 70 - 99 mg/dL    Comment: Glucose reference range applies only to samples taken after fasting for at least 8 hours.   BUN 9 6 -  20 mg/dL   Creatinine, Ser 4.13 0.61 - 1.24 mg/dL   Calcium 8.8 (L) 8.9 - 10.3 mg/dL   Total Protein 6.4 (L) 6.5 - 8.1 g/dL   Albumin 3.9 3.5 - 5.0 g/dL   AST 16 15 - 41 U/L   ALT 16 0 - 44 U/L   Alkaline Phosphatase 74 38 - 126 U/L   Total Bilirubin 0.6 <1.2 mg/dL   GFR, Estimated >24 >40 mL/min    Comment: (NOTE) Calculated using the CKD-EPI Creatinine Equation (2021)    Anion gap 6 5 - 15    Comment: Performed at [facility], [address], [place], [place] [address]  CBG monitoring, ED     Status: None   Collection Time: 08/18/23 11:07 AM  Result Value Ref Range   Glucose-Capillary 87 70 - 99 mg/dL    Comment: Glucose reference range applies only to samples taken after fasting for at least 8 hours.  I-stat chem 8, ED     Status: Abnormal   Collection Time: 08/18/23 11:13 AM  Result Value Ref Range   Sodium 138 135 - 145 mmol/L   Potassium 4.1 3.5 - 5.1 mmol/L   Chloride 102 98 - 111 mmol/L   BUN 10 6 - 20 mg/dL   Creatinine, Ser 5.36 0.61 - 1.24 mg/dL   Glucose, Bld 93 70 - 99 mg/dL    Comment: Glucose reference range applies only to samples taken after fasting for at least 8 hours.   Calcium, Ion 1.09 (L) 1.15 - 1.40 mmol/L   TCO2 25 22 - 32 mmol/L   Hemoglobin 15.6 13.0 - 17.0 g/dL   HCT 64.4 03.4 - 74.2 %  Urine rapid drug screen (hosp performed)     Status: Abnormal   Collection Time: 08/18/23 12:47 PM  Result Value Ref Range   Opiates NONE DETECTED NONE DETECTED   Cocaine NONE DETECTED NONE DETECTED   Benzodiazepines POSITIVE (A) NONE DETECTED   Amphetamines NONE DETECTED NONE DETECTED   Tetrahydrocannabinol NONE DETECTED NONE DETECTED   Barbiturates NONE DETECTED NONE DETECTED    Comment: (NOTE) DRUG SCREEN FOR MEDICAL PURPOSES ONLY.  IF CONFIRMATION IS NEEDED FOR ANY PURPOSE, NOTIFY LAB WITHIN 5  DAYS.  LOWEST DETECTABLE LIMITS FOR URINE DRUG SCREEN Drug Class                     Cutoff (ng/mL) Amphetamine and metabolites    1000 Barbiturate and metabolites    200 Benzodiazepine                 200 Opiates and metabolites        300 Cocaine and metabolites        300 THC                            50 Performed at [facility], [address] [place], [place] [address]   Urinalysis, Routine w reflex microscopic -Urine, Clean Catch     Status: None   Collection Time: 08/18/23 12:47 PM  Result Value Ref Range   Color, Urine YELLOW YELLOW   APPearance CLEAR CLEAR   Specific Gravity, Urine 1.011 1.005 - 1.030   pH 7.0 5.0 - 8.0   Glucose, UA NEGATIVE NEGATIVE mg/dL   Hgb urine dipstick NEGATIVE NEGATIVE   Bilirubin Urine NEGATIVE NEGATIVE   Ketones, ur NEGATIVE NEGATIVE mg/dL   Protein, ur NEGATIVE NEGATIVE mg/dL  Nitrite NEGATIVE NEGATIVE   Leukocytes,Ua NEGATIVE NEGATIVE    Comment: Performed at [facility], [address], [place], [place] [address]   CT HEAD CODE STROKE WO CONTRAST  Result Date: 08/18/2023 CLINICAL DATA:  Code stroke. Neuro deficit, acute, stroke suspected. EXAM: CT HEAD WITHOUT CONTRAST TECHNIQUE: Contiguous axial images were obtained from the base of the skull through the vertex without intravenous contrast. RADIATION DOSE REDUCTION: This exam was performed according to the departmental dose-optimization program which includes automated exposure control, adjustment of the mA and/or kV according to patient size and/or use of iterative reconstruction technique. COMPARISON:  Head CT 02/27/2021. FINDINGS: Brain: Within limits of metal artifact from bilateral cochlear implants, no acute hemorrhage. Unchanged thin, chronic bilateral frontoparietal subdural hematomas. Unchanged scattered parenchymal calcifications, greatest in the posterior fossa and medial aspects of the bilateral occipital lobes. Old perforator infarcts in the bilateral thalami  posterior limb of the left internal capsule. No new loss of gray-white differentiation. Unchanged right posterior approach ventricular shunt catheter with tip traversing the body of the left lateral ventricle. Stable size and configuration of the shunted ventricles. Stable postoperative changes of suboccipital craniotomy and resection of a posterior fossa mass. Vascular: No hyperdense vessel or unexpected calcification. Skull: Old right frontal right parietal burr holes. Prior suboccipital craniectomy. Sinuses/Orbits: No acute findings. Other: None. ASPECTS [facility] Stroke Program Early CT Score) - Ganglionic level infarction (caudate, lentiform nuclei, internal capsule, insula, M1-M3 cortex): 7 - Supraganglionic infarction (M4-M6 cortex): 3 Total score (0-10 with 10 being normal): 10 IMPRESSION: 1. No acute intracranial hemorrhage or evidence of acute infarct. ASPECT score is 10. 2. Unchanged thin, chronic bilateral frontoparietal subdural hematomas. 3. Unchanged right posterior approach ventricular shunt catheter with stable size and configuration of the shunted ventricles. 4. Stable postoperative changes of suboccipital craniotomy and resection of a posterior fossa mass. Code stroke imaging results were communicated on 08/18/2023 at 11:26 am to provider Dr. Otelia Limes via secure text paging. Electronically Signed   By: Orvan Falconer M.D.   On: 08/18/2023 11:26    Pending Labs Unresulted Labs (From admission, onward)     Start     Ordered   08/19/23 0500  CBC with Differential/Platelet  Tomorrow morning,   R        08/18/23 1910   08/19/23 0500  Comprehensive metabolic panel  Tomorrow morning,   R        08/18/23 1910   08/19/23 0500  Lipid panel  Tomorrow morning,   R        08/18/23 1910   08/19/23 0500  Hemoglobin A1c  Tomorrow morning,   R        08/18/23 1917   08/18/23 1820  HIV Antibody (routine testing w rflx)  (HIV Antibody (Routine testing w reflex) panel)  Once,   R        08/18/23 1822    08/18/23 1540  10-Hydroxycarbazepine  Once,   URGENT        08/18/23 1539            Vitals/Pain Today's Vitals   08/18/23 1515 08/18/23 1530 08/18/23 1545 08/18/23 1648  BP: 126/89 (!) 129/97 (!) 130/90   Pulse: 66 70 87   Resp:      Temp:    98.4 F (36.9 C)  TempSrc:      SpO2: 100% 98% 100%   Weight:      Height:      PainSc:  Isolation Precautions No active isolations  Medications Medications  OXcarbazepine (TRILEPTAL) 300 MG/5ML suspension 300 mg (has no administration in time range)  enoxaparin (LOVENOX) injection 40 mg (has no administration in time range)  lidocaine (LIDODERM) 5 % 2 patch (has no administration in time range)  acetaminophen (TYLENOL) tablet 650 mg (has no administration in time range)  ALPRAZolam (XANAX) tablet 0.5 mg (has no administration in time range)  metoprolol succinate (TOPROL-XL) 24 hr tablet 50 mg (has no administration in time range)  aspirin EC tablet 81 mg (has no administration in time range)  clopidogrel (PLAVIX) tablet 75 mg (has no administration in time range)    Mobility Walks at baseline, weakness present, unable to walk on arrival     Focused Assessments    R Recommendations: See Admitting Provider Note  Report given to:   Additional Notes:  Pt speaks softly, difficult to hear over call bell

## 2023-08-18 NOTE — Hospital Course (Addendum)
[name] Love is a 52 y.o. who has a PMH of vertebrobasilar, thalamic stroke, hemiparesis, left leg weakness, ependymoma s/p resection and radiation therapy as well as V/P shunt, seizures, cochlear implants, and anxiety presenting with left sided weakness and numbness admitted due to concerns of anew ischemic infarct   Clinical stroke Suspected right lacunar CVA Hx of vertebrobasilar/thalamic infarct with R sided hemiparesis Worsening LLE weakness Dysphagia, improved on diet  CT head without evidence of acute infarct. Decision to not pursue MRI brain as there is potential for poor study due to bilateral cochlear implants and reposition of VP shunt post MRI. Neurology consulted; symptoms likely explained from R lacunar infarct or from vasculopathy 2/2 prior radiation. CT lumbar/sacral spine without acute abnormalities to explain symptoms. TTE with normal EF and normal valve function. EEF without evidence of seizures. Telemetry without acute findings. Currently patient is being managed for the clinical symptoms of a CVA. Considered repeating CT head as it may now show ischemic infarct not visible in the acute setting; however, likely not to change current management of disease. He will continue on DAPT for three weeks, and plavix indefinitely thereafter, statin therapy. PT evaluated; given 3/5LLE weakness, patient will require subacute rehabilitation at SNF with likely transition to assisted living. SLP consulted during this admission; now s/p modified barium swallow with recommendations to maintain patient of dysphagia 3 diet and thick nectar fluids. May benefit from crushed pills, when able.   Seizure disorder No evidence of seizures on this admission. Continued on PTA oxcarbazepine.  B12 deficiency B12 levels 134 during this admission.  Unlikely explained acute on symptoms on admission. Will need discharge on 1000 mcg B12 daily  Hypertension Hx of ventricular ectopy Continued on metoprolol succinate  50 mg daily  Anxiety Last change in dose over a year ago. High risk for depressive episode with life transition and ongoing difficult life adjustments. He was continued on PTA Xanax BID    [ ] Labs ordered to monitor hyponatremia tomorrow AM [ ] Pending SNF choice and insurance approval prior to DC [ ] SSRi discussion during this admission [ ]  need to reach to [facility] - tried to do so but unable to reach yesterday and today

## 2023-08-19 ENCOUNTER — Observation Stay (HOSPITAL_COMMUNITY): Payer: Medicare Other

## 2023-08-19 DIAGNOSIS — E785 Hyperlipidemia, unspecified: Secondary | ICD-10-CM | POA: Diagnosis present

## 2023-08-19 DIAGNOSIS — R471 Dysarthria and anarthria: Secondary | ICD-10-CM | POA: Diagnosis present

## 2023-08-19 DIAGNOSIS — I10 Essential (primary) hypertension: Secondary | ICD-10-CM | POA: Diagnosis present

## 2023-08-19 DIAGNOSIS — Z91041 Radiographic dye allergy status: Secondary | ICD-10-CM | POA: Diagnosis not present

## 2023-08-19 DIAGNOSIS — R9431 Abnormal electrocardiogram [ECG] [EKG]: Secondary | ICD-10-CM | POA: Diagnosis not present

## 2023-08-19 DIAGNOSIS — G4733 Obstructive sleep apnea (adult) (pediatric): Secondary | ICD-10-CM | POA: Diagnosis present

## 2023-08-19 DIAGNOSIS — Z808 Family history of malignant neoplasm of other organs or systems: Secondary | ICD-10-CM | POA: Diagnosis not present

## 2023-08-19 DIAGNOSIS — Z79899 Other long term (current) drug therapy: Secondary | ICD-10-CM | POA: Diagnosis not present

## 2023-08-19 DIAGNOSIS — Z91018 Allergy to other foods: Secondary | ICD-10-CM | POA: Diagnosis not present

## 2023-08-19 DIAGNOSIS — Z85841 Personal history of malignant neoplasm of brain: Secondary | ICD-10-CM | POA: Diagnosis not present

## 2023-08-19 DIAGNOSIS — I63311 Cerebral infarction due to thrombosis of right middle cerebral artery: Secondary | ICD-10-CM | POA: Diagnosis not present

## 2023-08-19 DIAGNOSIS — E538 Deficiency of other specified B group vitamins: Secondary | ICD-10-CM | POA: Diagnosis present

## 2023-08-19 DIAGNOSIS — R569 Unspecified convulsions: Secondary | ICD-10-CM

## 2023-08-19 DIAGNOSIS — Z982 Presence of cerebrospinal fluid drainage device: Secondary | ICD-10-CM | POA: Diagnosis not present

## 2023-08-19 DIAGNOSIS — I700 Atherosclerosis of aorta: Secondary | ICD-10-CM | POA: Diagnosis not present

## 2023-08-19 DIAGNOSIS — I639 Cerebral infarction, unspecified: Secondary | ICD-10-CM | POA: Diagnosis not present

## 2023-08-19 DIAGNOSIS — R29898 Other symptoms and signs involving the musculoskeletal system: Secondary | ICD-10-CM | POA: Diagnosis not present

## 2023-08-19 DIAGNOSIS — Z9621 Cochlear implant status: Secondary | ICD-10-CM | POA: Diagnosis present

## 2023-08-19 DIAGNOSIS — H903 Sensorineural hearing loss, bilateral: Secondary | ICD-10-CM | POA: Diagnosis present

## 2023-08-19 DIAGNOSIS — E039 Hypothyroidism, unspecified: Secondary | ICD-10-CM | POA: Diagnosis present

## 2023-08-19 DIAGNOSIS — G40909 Epilepsy, unspecified, not intractable, without status epilepticus: Secondary | ICD-10-CM | POA: Diagnosis present

## 2023-08-19 DIAGNOSIS — I6381 Other cerebral infarction due to occlusion or stenosis of small artery: Secondary | ICD-10-CM | POA: Diagnosis present

## 2023-08-19 DIAGNOSIS — G9389 Other specified disorders of brain: Secondary | ICD-10-CM | POA: Diagnosis not present

## 2023-08-19 DIAGNOSIS — Z888 Allergy status to other drugs, medicaments and biological substances status: Secondary | ICD-10-CM | POA: Diagnosis not present

## 2023-08-19 DIAGNOSIS — R29711 NIHSS score 11: Secondary | ICD-10-CM | POA: Diagnosis present

## 2023-08-19 DIAGNOSIS — F419 Anxiety disorder, unspecified: Secondary | ICD-10-CM | POA: Diagnosis present

## 2023-08-19 DIAGNOSIS — I6529 Occlusion and stenosis of unspecified carotid artery: Secondary | ICD-10-CM | POA: Diagnosis not present

## 2023-08-19 DIAGNOSIS — Z7982 Long term (current) use of aspirin: Secondary | ICD-10-CM | POA: Diagnosis not present

## 2023-08-19 DIAGNOSIS — Z7902 Long term (current) use of antithrombotics/antiplatelets: Secondary | ICD-10-CM | POA: Diagnosis not present

## 2023-08-19 DIAGNOSIS — M5416 Radiculopathy, lumbar region: Secondary | ICD-10-CM | POA: Diagnosis not present

## 2023-08-19 DIAGNOSIS — Z882 Allergy status to sulfonamides status: Secondary | ICD-10-CM | POA: Diagnosis not present

## 2023-08-19 DIAGNOSIS — I69351 Hemiplegia and hemiparesis following cerebral infarction affecting right dominant side: Secondary | ICD-10-CM | POA: Diagnosis not present

## 2023-08-19 DIAGNOSIS — R1310 Dysphagia, unspecified: Secondary | ICD-10-CM | POA: Diagnosis not present

## 2023-08-19 DIAGNOSIS — Z923 Personal history of irradiation: Secondary | ICD-10-CM | POA: Diagnosis not present

## 2023-08-19 LAB — CBC WITH DIFFERENTIAL/PLATELET
Abs Immature Granulocytes: 0.02 10*3/uL (ref 0.00–0.07)
Basophils Absolute: 0 10*3/uL (ref 0.0–0.1)
Basophils Relative: 0 %
Eosinophils Absolute: 0 10*3/uL (ref 0.0–0.5)
Eosinophils Relative: 0 %
HCT: 47.8 % (ref 39.0–52.0)
Hemoglobin: 16.4 g/dL (ref 13.0–17.0)
Immature Granulocytes: 0 %
Lymphocytes Relative: 15 %
Lymphs Abs: 0.7 10*3/uL (ref 0.7–4.0)
MCH: 30.8 pg (ref 26.0–34.0)
MCHC: 34.3 g/dL (ref 30.0–36.0)
MCV: 89.7 fL (ref 80.0–100.0)
Monocytes Absolute: 0.1 10*3/uL (ref 0.1–1.0)
Monocytes Relative: 2 %
Neutro Abs: 4 10*3/uL (ref 1.7–7.7)
Neutrophils Relative %: 83 %
Platelets: 240 10*3/uL (ref 150–400)
RBC: 5.33 MIL/uL (ref 4.22–5.81)
RDW: 12 % (ref 11.5–15.5)
WBC: 4.8 10*3/uL (ref 4.0–10.5)
nRBC: 0 % (ref 0.0–0.2)

## 2023-08-19 LAB — COMPREHENSIVE METABOLIC PANEL
ALT: 16 U/L (ref 0–44)
AST: 15 U/L (ref 15–41)
Albumin: 3.9 g/dL (ref 3.5–5.0)
Alkaline Phosphatase: 75 U/L (ref 38–126)
Anion gap: 8 (ref 5–15)
BUN: 14 mg/dL (ref 6–20)
CO2: 22 mmol/L (ref 22–32)
Calcium: 9.2 mg/dL (ref 8.9–10.3)
Chloride: 102 mmol/L (ref 98–111)
Creatinine, Ser: 1.34 mg/dL — ABNORMAL HIGH (ref 0.61–1.24)
GFR, Estimated: >60 mL/min (ref >60)
Glucose, Bld: 126 mg/dL — ABNORMAL HIGH (ref 70–99)
Potassium: 4.3 mmol/L (ref 3.5–5.1)
Sodium: 132 mmol/L — ABNORMAL LOW (ref 135–145)
Total Bilirubin: 0.6 mg/dL (ref <1.2)
Total Protein: 6.6 g/dL (ref 6.5–8.1)

## 2023-08-19 LAB — HEMOGLOBIN A1C
Hgb A1c MFr Bld: 5.4 % (ref 4.8–5.6)
Mean Plasma Glucose: 108.28 mg/dL

## 2023-08-19 LAB — LIPID PANEL
Cholesterol: 162 mg/dL (ref 0–200)
HDL: 35 mg/dL — ABNORMAL LOW (ref >40)
LDL Cholesterol: 98 mg/dL (ref 0–99)
Total CHOL/HDL Ratio: 4.6 {ratio}
Triglycerides: 145 mg/dL (ref <150)
VLDL: 29 mg/dL (ref 0–40)

## 2023-08-19 LAB — ECHOCARDIOGRAM LIMITED
Height: 68 in
S' Lateral: 2.7 cm
Weight: 2991.2 [oz_av]

## 2023-08-19 LAB — HIV ANTIBODY (ROUTINE TESTING W REFLEX): HIV Screen 4th Generation wRfx: NONREACTIVE

## 2023-08-19 MED ORDER — PREDNISONE 5 MG PO TABS
50.0000 mg | ORAL_TABLET | Freq: Four times a day (QID) | ORAL | Status: AC
  Administered 2023-08-19 (×3): 50 mg via ORAL
  Filled 2023-08-19 (×3): qty 2

## 2023-08-19 MED ORDER — POLYETHYLENE GLYCOL 3350 17 G PO PACK
17.0000 g | PACK | Freq: Every day | ORAL | Status: DC
  Administered 2023-08-19 – 2023-08-23 (×5): 17 g via ORAL
  Filled 2023-08-19 (×5): qty 1

## 2023-08-19 MED ORDER — SENNOSIDES-DOCUSATE SODIUM 8.6-50 MG PO TABS
1.0000 | ORAL_TABLET | Freq: Every day | ORAL | Status: DC
  Administered 2023-08-19 – 2023-08-22 (×4): 1 via ORAL
  Filled 2023-08-19 (×4): qty 1

## 2023-08-19 MED ORDER — ATORVASTATIN CALCIUM 40 MG PO TABS
40.0000 mg | ORAL_TABLET | Freq: Every day | ORAL | Status: DC
  Administered 2023-08-19 – 2023-08-23 (×5): 40 mg via ORAL
  Filled 2023-08-19 (×5): qty 1

## 2023-08-19 MED ORDER — DIPHENHYDRAMINE HCL 25 MG PO CAPS
50.0000 mg | ORAL_CAPSULE | Freq: Once | ORAL | Status: AC
Start: 2023-08-19 — End: 2023-08-19

## 2023-08-19 MED ORDER — IOHEXOL 350 MG/ML SOLN
75.0000 mL | Freq: Once | INTRAVENOUS | Status: AC | PRN
  Administered 2023-08-19: 75 mL via INTRAVENOUS

## 2023-08-19 MED ORDER — DIPHENHYDRAMINE HCL 50 MG/ML IJ SOLN
50.0000 mg | Freq: Once | INTRAMUSCULAR | Status: AC
Start: 2023-08-19 — End: 2023-08-19
  Administered 2023-08-19: 50 mg via INTRAVENOUS
  Filled 2023-08-19: qty 1

## 2023-08-19 NOTE — Evaluation (Signed)
Occupational Therapy Evaluation Patient Details Name: [name] Love MRN: [id number] DOB: 27-Nov-1970 Today's Date: 08/19/2023   History of Present Illness Pt is 52 year old presented to [facility] on  08/18/23 for new lt sided weakness. EEG normal. CT negative for acute changes. MRI pending. PMH - vertebrobasilar/thalamic stroke, hemiparesis, left leg weakness; ependymoma s/p resection and radiation and V/P shunt; seizures, cochlear implants, and anxiety   Clinical Impression   PTA pt lives alone and has assistance for IADL tasks as needed from his niece. Pt states he drives at baseline and has a service dog who "tells him what to do ". Chandan has frequent falls at baseline, up to 3-4 a week and his service dog "circles me until I can stand up". Pt reports he drives however also reports he has double vision at baseline (has prisms in glasses). Pt with significant functional decline, requiring mod A to stand, unable to ambulate at this time, L lateral lean and decreased functional use of LUE, requiring max A with LB ADL tasks. Pt would Patient will benefit from intensive inpatient follow up therapy, >3 hours/day, however will most likely need assistance after DC. If assistance not available, may need to explore alternative DC venues. Acute OT to follow.       If plan is discharge home, recommend the following: Two people to help with walking and/or transfers;A lot of help with bathing/dressing/bathroom;Assistance with cooking/housework;Direct supervision/assist for medications management;Direct supervision/assist for financial management;Assist for transportation    Functional Status Assessment  Patient has had a recent decline in their functional status and demonstrates the ability to make significant improvements in function in a reasonable and predictable amount of time.  Equipment Recommendations  BSC/3in1    Recommendations for Other Services Rehab consult     Precautions / Restrictions  Precautions Precautions: Fall Restrictions Weight Bearing Restrictions: No      Mobility Bed Mobility               General bed mobility comments: OOB in chari    Transfers Overall transfer level: Needs assistance   Transfers: Sit to/from Stand, Bed to chair/wheelchair/BSC                    Balance Overall balance assessment: Needs assistance Sitting-balance support: Bilateral upper extremity supported, Feet supported Sitting balance-Leahy Scale: Poor   Postural control: Right lateral lean, Left lateral lean Standing balance support: Bilateral upper extremity supported Standing balance-Leahy Scale: Poor                             ADL either performed or assessed with clinical judgement   ADL Overall ADL's : Needs assistance/impaired Eating/Feeding: Set up   Grooming: Set up;Sitting   Upper Body Bathing: Minimal assistance;Sitting   Lower Body Bathing: Maximal assistance;Sit to/from stand   Upper Body Dressing : Moderate assistance   Lower Body Dressing: Maximal assistance;Sit to/from Database administrator: +2 for physical assistance Toilet Transfer Details (indicate cue type and reason): unableot pivot with +1 Toileting- Clothing Manipulation and Hygiene: Maximal assistance       Functional mobility during ADLs: +2 for physical assistance General ADL Comments: Able to stand with mod A +1 however began to giveway and sat back onto his chair     Vision Baseline Vision/History: 1 Wears glasses Ability to See in Adequate Light: 1 Impaired Patient Visual Report: Diplopia;Blurring of vision Vision Assessment?: Vision impaired- to be further tested  in functional context Additional Comments: Pt reports vision deficits at baseline and has prisms in his glasses; states "[facility]" is changing his glasses becuase he is still having double and "triple" vision. Tape placed on nasal portion of L lens which decreased the double image, however pt  states he sees better without the tape.      Perception Perception: Impaired Preception Impairment Details: Spatial orientation     Praxis         Pertinent Vitals/Pain Pain Assessment Pain Assessment: No/denies pain     Extremity/Trunk Assessment Upper Extremity Assessment Upper Extremity Assessment: Right hand dominant;RUE deficits/detail;LUE deficits/detail RUE Deficits / Details: overall WFL however mild dystonia noted LUE Deficits / Details: unable to lift at shoulder; however rigid and able to hold against gravity for brief periods of time; incoordinated; using as a functional assist; ableto position L hand on armrest to help push up   Lower Extremity Assessment Lower Extremity Assessment: Defer to PT evaluation   Cervical / Trunk Assessment Cervical / Trunk Assessment: Other exceptions (R lateral lean when attmepting to lift/hold LUE; otherwise L lateral lean)   Communication Communication Communication: Hearing impairment (has cochlear implants)   Cognition Arousal: Alert Behavior During Therapy: WFL for tasks assessed/performed Overall Cognitive Status: No family/caregiver present to determine baseline cognitive functioning                                 General Comments: Decreased awareness of safety; states he drives however also staes heis vision is poor and has couble or "triple vision"; reports difficulty with medication management  - discussed how someone talked with him about the importance of not making mistakes with his seizure meds     General Comments       Exercises     Shoulder Instructions      Home Living Family/patient expects to be discharged to:: Private residence Living Arrangements: Alone Available Help at Discharge: Family;Available PRN/intermittently Type of Home: Apartment Home Access: Stairs to enter Entrance Stairs-Number of Steps: flight Entrance Stairs-Rails: Right;Left Home Layout: One level     Bathroom  Shower/Tub: Chief Strategy Officer: Standard Bathroom Accessibility: Yes How Accessible: Accessible via walker Home Equipment: Rollator (4 wheels);Cane - single point   Additional Comments: Has service dog      Prior Functioning/Environment Prior Level of Function : Independent/Modified Independent;Driving             Mobility Comments: Pt reports he uses a cane. Has a rollator but can't get it up/down the stairs to apt ADLs Comments: reports his service dog tells him what to do        OT Problem List: Decreased strength;Decreased range of motion;Decreased activity tolerance;Impaired balance (sitting and/or standing);Impaired vision/perception;Decreased coordination;Decreased cognition;Decreased safety awareness;Impaired sensation;Impaired UE functional use      OT Treatment/Interventions: Self-care/ADL training;Therapeutic exercise;Neuromuscular education;DME and/or AE instruction;Therapeutic activities;Cognitive remediation/compensation;Visual/perceptual remediation/compensation;Patient/family education;Balance training    OT Goals(Current goals can be found in the care plan section) Acute Rehab OT Goals Patient Stated Goal: to get better OT Goal Formulation: With patient Time For Goal Achievement: 09/02/23 Potential to Achieve Goals: Good  OT Frequency: Min 1X/week    Co-evaluation              AM-PAC OT "6 Clicks" Daily Activity     Outcome Measure Help from another person eating meals?: A Little Help from another person taking care of personal grooming?: A Little  Help from another person toileting, which includes using toliet, bedpan, or urinal?: A Lot Help from another person bathing (including washing, rinsing, drying)?: A Lot Help from another person to put on and taking off regular upper body clothing?: A Lot Help from another person to put on and taking off regular lower body clothing?: A Lot 6 Click Score: 14   End of Session Equipment  Utilized During Treatment: Gait belt;Rolling walker (2 wheels) Nurse Communication: Mobility status;Need for lift equipment Antony Salmon)  Activity Tolerance: Patient tolerated treatment well Patient left: in chair;with call bell/phone within reach;with chair alarm set  OT Visit Diagnosis: Unsteadiness on feet (R26.81);Other abnormalities of gait and mobility (R26.89);Repeated falls (R29.6);Muscle weakness (generalized) (M62.81);Low vision, both eyes (H54.2);Other symptoms and signs involving cognitive function;Hemiplegia and hemiparesis Hemiplegia - Right/Left: Left Hemiplegia - dominant/non-dominant: Non-Dominant Hemiplegia - caused by: Unspecified                Time: 1610-9604 OT Time Calculation (min): 24 min Charges:  OT General Charges $OT Visit: 1 Visit OT Evaluation $OT Eval Moderate Complexity: 1 Mod OT Treatments $Self Care/Home Management : 8-22 mins  Luisa Dago, OT/L   Acute OT Clinical Specialist Acute Rehabilitation Services Pager [phone] Office [phone]   [facility] 08/19/2023, 3:14 PM

## 2023-08-19 NOTE — Progress Notes (Addendum)
HD#0 SUBJECTIVE:  Patient Summary: [name] Love is a 52 y.o. 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  Overnight Events: NAOE  Interim History:   Feels unchanged from yesterday. Back feels better with the lidocaine patch.    OBJECTIVE:  Vital Signs: Vitals:   08/19/23 0012 08/19/23 0342 08/19/23 0800 08/19/23 1301  BP: 97/73 122/87 112/82 124/89  Pulse: 76 73 72 77  Resp: 16 16 16 17   Temp: 98.7 F (37.1 C) 98.1 F (36.7 C) 98.3 F (36.8 C)   TempSrc: Oral Oral Oral   SpO2: 97% 98% 98% 100%  Weight:      Height:       Supplemental O2: Room Air SpO2: 100 %  Filed Weights   08/18/23 1144 08/18/23 2000  Weight: 83 kg 84.8 kg     Intake/Output Summary (Last 24 hours) at 08/19/2023 1306 Last data filed at 08/19/2023 0600 Gross per 24 hour  Intake 240 ml  Output 1200 ml  Net -960 ml   Net IO Since Admission: -960 mL [08/19/23 1306]  Physical Exam: Physical Exam Constitutional:      General: He is not in acute distress.    Appearance: He is not ill-appearing or toxic-appearing.  Cardiovascular:     Rate and Rhythm: Normal rate.     Heart sounds: No murmur heard.    No friction rub. No gallop.  Pulmonary:     Effort: No respiratory distress.     Breath sounds: No wheezing, rhonchi or rales.  Abdominal:     General: There is no distension.     Tenderness: There is no abdominal tenderness. There is no guarding.     Hernia: No hernia is present.  Musculoskeletal:     Right lower leg: No edema.     Left lower leg: No edema.  Neurological:     Mental Status: He is oriented to person, place, and time.     Comments: TTP on L5.  RUE 5/5 LUE with variable drift. When not paying attention or when asked a question about his  headache he will left left hand and touch his head, will state that he has an abnormal sensation on the head while scratching his head with his left arm, fully elevated.  Positive pronator drift on the left. LLE 2/5  RLE 5/5 Grip is diminished on the left.  Sensation diminished on the left leg     Patient Lines/Drains/Airways Status     Active Line/Drains/Airways     Name Placement date Placement time Site Days   Peripheral IV 08/18/23 18 G Right Antecubital 08/18/23  1140  Antecubital  1   External Urinary Catheter 08/18/23  2000  --  1             ASSESSMENT/PLAN:  Assessment: Principal Problem:   Cerebral infarction [facility])   Plan:  #Cerebral Infarction  #Left sided weakness  Left sided weakness on the leg may be due to sciatica or metastasis from his ependymal brain tumor. MRI deferred due to poor quality with cochlear implants and need to reset VP shunt. No localizable lesion on exam, CT negative for stroke. Negative CTA. LDL is 98, will benefit from high-intensity statin. Hgb A1C not concerning for diabetes. Echo unremarkable.   -CT lumbar spine -cw Plavix  -  ASA -start Atorvastatin 40mg  (LDL elevated) -PT/OT - Frequent neuro checks - Telemetry monitoring -Lidocaine Patch on back -Tylenol for pain   #Hx of Seizures  EEG not concerning for seizures.  -Cw oxcarbazepine 300mg  BID   #Hx of Ventricular Ectopy  Cw metoprolol succinate 50 mg daily    #Anxiety  Dose was changed over a year ago. Unlikely to be worsening his sxs now.  -cw xanax 0.5 mg BID     Best Practice: Diet: Dysphagia 3 diet per SLP VTE: enoxaparin (LOVENOX) injection 40 mg Start: 08/18/23 1830 Code: Full Therapy Recs: acute inpatient rehab DISPO: Anticipated discharge tomorrow to Home pending  medical work-up .  Signature: [facility]  Internal Medicine Resident, PGY-1 [facility]  Pager: [phone] 1:06 PM, 08/19/2023   Please  contact the on call pager after 5 pm and on weekends at [phone].

## 2023-08-19 NOTE — Plan of Care (Signed)
  Problem: Clinical Measurements: Goal: Cardiovascular complication will be avoided Outcome: Progressing   Problem: Education: Goal: Knowledge of General Education information will improve Description: Including pain rating scale, medication(s)/side effects and non-pharmacologic comfort measures Outcome: Progressing   Problem: Health Behavior/Discharge Planning: Goal: Ability to manage health-related needs will improve Outcome: Progressing   Problem: Clinical Measurements: Goal: Ability to maintain clinical measurements within normal limits will improve Outcome: Progressing Goal: Will remain free from infection Outcome: Progressing Goal: Diagnostic test results will improve Outcome: Progressing Goal: Respiratory complications will improve Outcome: Progressing Goal: Cardiovascular complication will be avoided Outcome: Progressing   Problem: Activity: Goal: Risk for activity intolerance will decrease Outcome: Progressing   Problem: Nutrition: Goal: Adequate nutrition will be maintained Outcome: Progressing   Problem: Coping: Goal: Level of anxiety will decrease Outcome: Progressing   Problem: Elimination: Goal: Will not experience complications related to bowel motility Outcome: Progressing Goal: Will not experience complications related to urinary retention Outcome: Progressing   Problem: Pain Management: Goal: General experience of comfort will improve Outcome: Progressing   Problem: Safety: Goal: Ability to remain free from injury will improve Outcome: Progressing   Problem: Skin Integrity: Goal: Risk for impaired skin integrity will decrease Outcome: Progressing   Problem: Education: Goal: Knowledge of patient specific risk factors will improve Loraine Leriche N/A or DELETE if not current risk factor) Outcome: Progressing   Problem: Ischemic Stroke/TIA Tissue Perfusion: Goal: Complications of ischemic stroke/TIA will be minimized Outcome: Progressing   Problem:  Coping: Goal: Will verbalize positive feelings about self Outcome: Progressing   Problem: Self-Care: Goal: Ability to participate in self-care as condition permits will improve Outcome: Progressing Goal: Verbalization of feelings and concerns over difficulty with self-care will improve Outcome: Progressing Goal: Ability to communicate needs accurately will improve Outcome: Progressing   Problem: Nutrition: Goal: Risk of aspiration will decrease Outcome: Progressing Goal: Dietary intake will improve Outcome: Progressing

## 2023-08-19 NOTE — Progress Notes (Signed)
Paged by the nurse about involuntary muscle movements/twitching after returning from his CTA. Went to evaluate the patient at bedside. Neuro exam was consistent with prior documented exams from earlier in the day. No new deficits noted. He did demonstrate an involuntary shudder-like twitch of his head and neck when he touches his nose, which he intentionally elicited several times for me to see. He also complains of pain in his left hip radiating down his leg with flexion. He remains concerned about his left sided weakness, but it does not seem to have progressed from yesterday's exams. We will continue q4h neuro checks and monitor for any changes.

## 2023-08-19 NOTE — Progress Notes (Addendum)
Inpatient Rehab Admissions Coordinator Note:   Per therapy recommendations patient was screened for CIR candidacy by Stephania Fragmin, PT. At this time, workup is still pending.  Will follow up tomorrow and rescreen for diagnosis amenable to rehab.   Estill Dooms, PT, DPT [phone] 08/19/23 12:19 PM

## 2023-08-19 NOTE — Progress Notes (Signed)
  Echocardiogram 2D Echocardiogram has been performed.  [name] Love 08/19/2023, 10:03 AM

## 2023-08-19 NOTE — Plan of Care (Signed)

## 2023-08-19 NOTE — Progress Notes (Signed)
Speech Language Pathology Treatment: Dysphagia  Patient Details Name: [name] Love MRN: [id number] DOB: 12/19/1970 Today's Date: 08/19/2023 Time: 1210-1229 SLP Time Calculation (min) (ACUTE ONLY): 19 min  Assessment / Plan / Recommendation Clinical Impression  Pt reports increased coughing and throat clearance with solids. SLP observed pt with trials of thin liquids and regular texture solids with immediate coughing and throat clearing throughout, despite cueing to avoid mixed consistencies and take small, single sips. Pt reports this is a significant change from baseline and is also quite different from initial evaluation last date. Recommend completing an MBS to further assess.    HPI HPI: [name] Love is a 52 yo male presenting to ED 11/14 with L sided weakness and R gaze preference. CTH negative. MRI Brain pending. PMH includes CVA 2019, anxiety, arthritis, bilateral arm weakness, bilateral hearing loss with cochlear implants, hypothyroidism, memory loss, seizures, insomnia      SLP Plan  MBS      Recommendations for follow up therapy are one component of a multi-disciplinary discharge planning process, led by the attending physician.  Recommendations may be updated based on patient status, additional functional criteria and insurance authorization.    Recommendations  Diet recommendations: Dysphagia 3 (mechanical soft);Thin liquid Liquids provided via: Cup;Straw Medication Administration: Whole meds with liquid Supervision: Patient able to self feed Compensations: Minimize environmental distractions;Slow rate;Small sips/bites Postural Changes and/or Swallow Maneuvers: Seated upright 90 degrees                  Oral care BID   Frequent or constant Supervision/Assistance Dysphagia, unspecified (R13.10)     MBS     Gwynneth Aliment, M.A., CF-SLP Speech Language Pathology, Acute Rehabilitation Services  Secure Chat preferred [phone]   08/19/2023, 12:45 PM

## 2023-08-19 NOTE — Evaluation (Signed)
Physical Therapy Evaluation Patient Details Name: [name] Love MRN: [id number] DOB: 1971/07/12 Today's Date: 08/19/2023  History of Present Illness  Pt is 52 year old presented to [facility] on  08/18/23 for new lt sided weakness. EEG normal. CT negative for acute changes. MRI pending. PMH - vertebrobasilar/thalamic stroke, hemiparesis, left leg weakness; ependymoma s/p resection and radiation and V/P shunt; seizures, cochlear implants, and anxiety  Clinical Impression  Pt admitted with above diagnosis and presents to PT with functional limitations due to deficits listed below (See PT problem list). Pt needs skilled PT to maximize independence and safety. Pt with poor balance and lt sided weakness with some variable performance. Pt unable to amb and requiring 2 person assist to stand with stedy. Lives in 2 story apt and lives alone. Patient will benefit from intensive inpatient follow up therapy, >3 hours/day           If plan is discharge home, recommend the following: A lot of help with walking and/or transfers;A lot of help with bathing/dressing/bathroom;Assist for transportation;Help with stairs or ramp for entrance   Can travel by private vehicle        Equipment Recommendations Other (comment) (To be determined)  Recommendations for Other Services       Functional Status Assessment Patient has had a recent decline in their functional status and demonstrates the ability to make significant improvements in function in a reasonable and predictable amount of time.     Precautions / Restrictions Precautions Precautions: Fall Restrictions Weight Bearing Restrictions: No      Mobility  Bed Mobility Overal bed mobility: Needs Assistance Bed Mobility: Supine to Sit     Supine to sit: Min assist, HOB elevated, Used rails     General bed mobility comments: Assist to elevate trunk into sitting and bring hips to EOB    Transfers Overall transfer level: Needs assistance Equipment  used: Ambulation equipment used Transfers: Sit to/from Stand, Bed to chair/wheelchair/BSC Sit to Stand: +2 physical assistance, Mod assist, Via lift equipment           General transfer comment: Attempted to stand from bed with walker and pt unable to stand with +2 max assist. Switched to [place] and able to stand with +2 mod to elevate hips. Transfer via Lift Equipment: Stedy  Ambulation/Gait               General Gait Details: Warden/ranger Bed    Modified Rankin (Stroke Patients Only)       Balance Overall balance assessment: Needs assistance Sitting-balance support: Bilateral upper extremity supported, Feet supported Sitting balance-Leahy Scale: Poor Sitting balance - Comments: Sat EOB with UE support and occasional loss of balance require assist to correct Postural control: Right lateral lean, Left lateral lean Standing balance support: Bilateral upper extremity supported Standing balance-Leahy Scale: Poor Standing balance comment: Stedy and +2 min assist                             Pertinent Vitals/Pain Pain Assessment Pain Assessment: No/denies pain    Home Living Family/patient expects to be discharged to:: Private residence Living Arrangements: Alone Available Help at Discharge: Family;Available PRN/intermittently Type of Home: Apartment Home Access: Stairs to enter Entrance Stairs-Rails: Right;Left Entrance Stairs-Number of Steps: flight   Home Layout: One level Home Equipment: Rollator (4 wheels);Cane - single point  Additional Comments: Has service dog    Prior Function Prior Level of Function : Independent/Modified Independent;Driving             Mobility Comments: Pt reports he uses a cane. Has a rollator but can't get it up/down the stairs to apt       Extremity/Trunk Assessment   Upper Extremity Assessment Upper Extremity Assessment: Defer to OT evaluation    Lower  Extremity Assessment Lower Extremity Assessment: LLE deficits/detail LLE Deficits / Details: To manual muscle testing pt 1/5. With function strength inconsistent. Was able to stand with [facility] with assist       Communication   Communication Communication: Hearing impairment (has cochlear implants)  Cognition Arousal: Alert Behavior During Therapy: WFL for tasks assessed/performed Overall Cognitive Status: Within Functional Limits for tasks assessed                                          General Comments      Exercises     Assessment/Plan    PT Assessment Patient needs continued PT services  PT Problem List Decreased strength;Decreased balance;Decreased mobility       PT Treatment Interventions DME instruction;Gait training;Functional mobility training;Therapeutic activities;Therapeutic exercise;Balance training;Patient/family education    PT Goals (Current goals can be found in the Care Plan section)  Acute Rehab PT Goals Patient Stated Goal: return home to service dog PT Goal Formulation: With patient Time For Goal Achievement: 09/02/23 Potential to Achieve Goals: Good    Frequency Min 1X/week     Co-evaluation               AM-PAC PT "6 Clicks" Mobility  Outcome Measure Help needed turning from your back to your side while in a flat bed without using bedrails?: A Little Help needed moving from lying on your back to sitting on the side of a flat bed without using bedrails?: A Little Help needed moving to and from a bed to a chair (including a wheelchair)?: Total Help needed standing up from a chair using your arms (e.g., wheelchair or bedside chair)?: Total Help needed to walk in hospital room?: Total Help needed climbing 3-5 steps with a railing? : Total 6 Click Score: 10    End of Session Equipment Utilized During Treatment: Gait belt Activity Tolerance: Patient tolerated treatment well Patient left: in chair;with call bell/phone  within reach;with chair alarm set Nurse Communication: Mobility status;Need for lift equipment PT Visit Diagnosis: Other abnormalities of gait and mobility (R26.89);Muscle weakness (generalized) (M62.81);Difficulty in walking, not elsewhere classified (R26.2);Other symptoms and signs involving the nervous system (R29.898)    Time: 4098-1191 PT Time Calculation (min) (ACUTE ONLY): 17 min   Charges:   PT Evaluation $PT Eval Moderate Complexity: 1 Mod   PT General Charges $$ ACUTE PT VISIT: 1 Visit         [facility] PT Acute Rehabilitation Services Office [phone]   Angelina Ok [facility] 08/19/2023, 11:26 AM

## 2023-08-19 NOTE — TOC CM/SW Note (Signed)
Transition of Care [facility]) - Inpatient Brief Assessment   Patient Details  Name: [name] Love MRN: [id number] Date of Birth: 10/10/1970  Transition of [facility] CM/SW Contact:    Baldemar Lenis, LCSW Phone Number: 08/19/2023, 4:01 PM   Clinical Narrative:   Patient from home alone, recommendations for CIR but medical workup ongoing at this time. CSW to follow for disposition needs.    Transition of Care Asessment: Insurance and Status: Insurance coverage has been reviewed Patient has primary care physician: Yes Home environment has been reviewed: Home alone Prior level of function:: Independent Prior/Current Home Services: No current home services Social Determinants of Health Reivew: SDOH reviewed no interventions necessary Readmission risk has been reviewed: Yes Transition of care needs: transition of care needs identified, TOC will continue to follow

## 2023-08-19 NOTE — Plan of Care (Signed)
  Problem: Health Behavior/Discharge Planning: Goal: Ability to manage health-related needs will improve Outcome: Progressing   Problem: Clinical Measurements: Goal: Will remain free from infection Outcome: Progressing   Problem: Clinical Measurements: Goal: Respiratory complications will improve Outcome: Progressing   Problem: Clinical Measurements: Goal: Diagnostic test results will improve Outcome: Progressing   Problem: Activity: Goal: Risk for activity intolerance will decrease Outcome: Progressing   Problem: Pain Management: Goal: General experience of comfort will improve Outcome: Progressing   Problem: Safety: Goal: Ability to remain free from injury will improve Outcome: Progressing

## 2023-08-19 NOTE — Procedures (Signed)
Patient Name: [name] Love  MRN: [id number]  Epilepsy Attending: Charlsie Quest  Referring Physician/Provider: Caryl Pina, MD  Date: 08/19/2023 Duration: 24.34 mins  Patient history: 52yo M with left sided weakness getting eeg to evaluate for seizure  Level of alertness: Awake, asleep  AEDs during EEG study: Xanax, Oxcarb  Technical aspects: This EEG study was done with scalp electrodes positioned according to the 10-20 International system of electrode placement. Electrical activity was reviewed with band pass filter of 1-70Hz , sensitivity of 7 uV/mm, display speed of 27mm/sec with a 60Hz  notched filter applied as appropriate. EEG data were recorded continuously and digitally stored.  Video monitoring was available and reviewed as appropriate.  Description: The posterior dominant rhythm consists of 8Hz  activity of moderate voltage (25-35 uV) seen predominantly in posterior head regions, symmetric and reactive to eye opening and eye closing. Sleep was characterized by vertex waves, sleep spindles (12 to 14 Hz), maximal frontocentral region. Physiologic photic driving was not seen during photic stimulation.  Hyperventilation was not performed.     IMPRESSION: This study is within normal limits. No seizures or epileptiform discharges were seen throughout the recording.  A normal interictal EEG does not exclude the diagnosis of epilepsy.  Darrol Brandenburg Annabelle Harman

## 2023-08-19 NOTE — Progress Notes (Signed)
Modified Barium Swallow Study  Patient Details  Name: [name] Love MRN: [id number] Date of Birth: 1971/09/23  Today's Date: 08/19/2023  Modified Barium Swallow completed.  Full report located under Chart Review in the Imaging Section.  History of Present Illness [name] Love is a 52 yo male presenting to ED 11/14 with L sided weakness and R gaze preference. CTH negative. MRI Brain pending. PMH includes CVA 2019, anxiety, arthritis, bilateral arm weakness, bilateral hearing loss with cochlear implants, hypothyroidism, memory loss, seizures, insomnia   Clinical Impression Pt presents with a mild oropharyngeal dysphagia characterized by seemingly reduced strength and coordination. Given consecutive sips of thin liquids, pt has reduced control and ability to protect his airway. Pt achieves adequate laryngeal vestibule closure during the initial swallow but subsequent swallows become increasingly disorganized with pt unable to coordinate or maintain laryngeal elevation. This results in silent aspiration of thin liquids (PAS 8). A chin tuck posture was effective in preventing airway invasion with thin liquids, although question pt's current ability to consistently use this strategy. Nectar thick liquids were transiently penetrated in trace quantities, which is considered a normal variant. Etiology of these acute swallowing deficits remains unknown. Recommend continuing Dys 3 texture solids and downgrading to nectar thick liquids. SLP will continue following to target dysphagia goals as able. Factors that may increase risk of adverse event in presence of aspiration Rubye Oaks & Clearance Coots 2021): Poor general health and/or compromised immunity;Limited mobility  Swallow Evaluation Recommendations Recommendations: PO diet PO Diet Recommendation: Dysphagia 3 (Mechanical soft);Mildly thick liquids (Level 2, nectar thick) Liquid Administration via: Cup;Straw Medication Administration: Whole meds with  puree Supervision: Patient able to self-feed;Full supervision/cueing for swallowing strategies Swallowing strategies  : Minimize environmental distractions;Slow rate;Small bites/sips Postural changes: Position pt fully upright for meals Oral care recommendations: Oral care BID (2x/day)      Gwynneth Aliment, M.A., CF-SLP Speech Language Pathology, Acute Rehabilitation Services  Secure Chat preferred [phone]  08/19/2023,3:41 PM

## 2023-08-19 NOTE — Progress Notes (Addendum)
STROKE TEAM PROGRESS NOTE   BRIEF HPI Mr. [name] Love is a 52 y.o. male with history of Abnormal gait, Acute arterial ischemic stroke, vertebrobasilar, thalamic , Acute CVA  Allergic rhinitis, Anxiety, Arthritis , Atypical chest pain , Bilateral arm weakness, Bilateral hearing loss , Brain cancer, Cerebral thrombosis with cerebral infarction , Cochlear implant in place, , Depression, Diplopia, Dyspnea, Ependymoma of brain , Episode of change in speech, Generalized anxiety disorder, Headache , Hearing loss, sensorineural, Hemiparesis and alteration of sensations as late effects of stroke, History of stroke, Hypothyroidism, Insomnia, Left leg weakness, Low vitamin B12 level, Memory loss, Myalgia , OSA on CPAP, Recurrent falls, Refusal of blood transfusions as patient is Jehovah's Witness, Seizure, , Sleeping difficulty, and Ventricular ectopy  who presents with  left side weakness via EMS from home.  Code stroke activated by EMS. LKW ~2100 when he went to bed. He states he was in his normal state of health prior to going to bed last night. This morning he tried to get up out of bed and fell due to increased left sided weakness. He endorses chronic left sided weakness due to prior stroke, but today it was significantly worse.    NIH on Admission 11  SIGNIFICANT HOSPITAL EVENTS   INTERIM HISTORY/SUBJECTIVE Patient attempted to get out of bed and collapsed to the floor yesterday. Had worsening weakness, numbness on the left side. Has previously had issues with balance and some chronic residual weakness on left after prior stroke but not this bad. Denies seizure yesterday or loss of consciousness. Continues to report back pain and left sided weakness decreased from baseline. Associates back pain, with possible small mass in spine. Patient has bilateral cochlear implants and radiologist feel that hardware could distort imaging.   Prior LOC a few weeks ago which lasted 5 minutes. Also reported left leg weakness  and numbness 2 weeks ago, symptoms resolved quickly afterwards.   OBJECTIVE  CBC    Component Value Date/Time   WBC 4.8 08/19/2023 0450   RBC 5.33 08/19/2023 0450   HGB 16.4 08/19/2023 0450   HGB 14.9 04/27/2023 1552   HCT 47.8 08/19/2023 0450   HCT 43.1 04/27/2023 1552   PLT 240 08/19/2023 0450   PLT 238 04/27/2023 1552   MCV 89.7 08/19/2023 0450   MCV 92 04/27/2023 1552   MCV 92 11/06/2014 1751   MCH 30.8 08/19/2023 0450   MCHC 34.3 08/19/2023 0450   RDW 12.0 08/19/2023 0450   RDW 11.7 04/27/2023 1552   RDW 12.8 11/06/2014 1751   LYMPHSABS 0.7 08/19/2023 0450   LYMPHSABS 1.4 04/27/2023 1552   LYMPHSABS 1.4 03/12/2014 1140   MONOABS 0.1 08/19/2023 0450   MONOABS 0.5 03/12/2014 1140   EOSABS 0.0 08/19/2023 0450   EOSABS 0.2 04/27/2023 1552   EOSABS 0.2 03/12/2014 1140   BASOSABS 0.0 08/19/2023 0450   BASOSABS 0.1 04/27/2023 1552   BASOSABS 0.0 03/12/2014 1140    BMET    Component Value Date/Time   NA 132 (L) 08/19/2023 0450   NA 140 04/27/2023 1552   NA 142 11/06/2014 1751   K 4.3 08/19/2023 0450   K 3.7 11/06/2014 1751   CL 102 08/19/2023 0450   CL 106 11/06/2014 1751   CO2 22 08/19/2023 0450   CO2 29 11/06/2014 1751   GLUCOSE 126 (H) 08/19/2023 0450   GLUCOSE 91 11/06/2014 1751   BUN 14 08/19/2023 0450   BUN 11 04/27/2023 1552   BUN 14 11/06/2014 1751   CREATININE 1.34 (  H) 08/19/2023 0450   CREATININE 1.11 11/06/2014 1751   CALCIUM 9.2 08/19/2023 0450   CALCIUM 8.8 11/06/2014 1751   EGFR 73 04/27/2023 1552   GFRNONAA >60 08/19/2023 0450   GFRNONAA >60 11/06/2014 1751   GFRNONAA >60 03/12/2014 1140    IMAGING past 24 hours CT ANGIO HEAD NECK W WO CM  Result Date: 08/19/2023 CLINICAL DATA:  Initial evaluation for neuro deficit, stroke suspected. No other relevant history provided. EXAM: CT ANGIOGRAPHY HEAD AND NECK WITH AND WITHOUT CONTRAST TECHNIQUE: Multidetector CT imaging of the head and neck was performed using the standard protocol during bolus  administration of intravenous contrast. Multiplanar CT image reconstructions and MIPs were obtained to evaluate the vascular anatomy. Carotid stenosis measurements (when applicable) are obtained utilizing NASCET criteria, using the distal internal carotid diameter as the denominator. RADIATION DOSE REDUCTION: This exam was performed according to the departmental dose-optimization program which includes automated exposure control, adjustment of the mA and/or kV according to patient size and/or use of iterative reconstruction technique. CONTRAST:  75mL OMNIPAQUE IOHEXOL 350 MG/ML SOLN COMPARISON:  CT from 08/18/2023 FINDINGS: CT HEAD FINDINGS Brain: Streak artifact from bilateral cochlear implants again noted. Right parieto-occipital approach VP shunt catheter in place with tip terminating at the left basal ganglia. Stable ventricular size and morphology without hydrocephalus. Chronic bilateral subdural hematomas without significant mass effect, stable. Sequelae of prior suboccipital craniectomy with parenchymal calcifications within the pons, cerebellum, and occipital regions bilaterally. Few remote lacunar infarcts about the deep gray nuclei. No visible acute intracranial hemorrhage. No visible large vessel territory infarct. No appreciable mass lesion or mass effect. Vascular: No abnormal hyperdense vessel. Skull: Prior suboccipital craniectomy. Right posterior burr hole craniotomy, with remote right frontal craniotomy as well. Visualized osseous structures demonstrate a diffusely heterogeneous appearance without focal lesion. Sinuses/Orbits: Globes and orbital soft tissues demonstrate no acute finding. Paranasal sinuses are clear. Other: Sequelae of prior mastoidectomy with bilateral cochlear implants in place. Review of the MIP images confirms the above findings CTA NECK FINDINGS Aortic arch: Examination degraded by motion artifact. Visualized aortic arch within normal limits for caliber with standard 3 vessel  morphology. No stenosis about the origin the great vessels. Right carotid system: Right common and internal carotid arteries are patent without stenosis or dissection. Left carotid system: Left common and internal carotid arteries are patent without stenosis or dissection. Vertebral arteries: Both vertebral arteries arise from the subclavian arteries. Proximal right vertebral artery not well assessed due to motion and adjacent venous contamination. Visualized vertebral arteries patent without stenosis or dissection. Skeleton: No visible discrete or worrisome osseous lesions. Other neck: No other acute finding. Upper chest: No other acute finding. Review of the MIP images confirms the above findings CTA HEAD FINDINGS Anterior circulation: Minor atheromatous change present about the carotid siphons without stenosis. A1 segments patent bilaterally. Normal anterior communicating artery complex. Anterior cerebral arteries patent to their distal aspects without stenosis. No M1 stenosis or occlusion. No proximal MCA branch occlusion or high-grade stenosis. Visualized distal MCA branches well perfused and symmetric. Posterior circulation: Both V4 segments widely patent without stenosis. Neither PICA visualized. Basilar patent without stenosis. Both superior cerebellar arteries patent at their origins. The right SCA is markedly attenuated and irregular. Both PCAs primarily supplied via the basilar. Both PCAs widely patent to their distal aspects without stenosis. Venous sinuses: Not well assessed due to timing of the contrast bolus. Anatomic variants: None significant.  No visible aneurysm. Review of the MIP images confirms the above findings IMPRESSION:  CT HEAD: 1. No acute intracranial abnormality. 2. Right parieto-occipital approach VP shunt catheter in place with tip terminating at the left basal ganglia. Stable ventricular size and morphology without hydrocephalus. 3. Chronic bilateral subdural hematomas without  significant mass effect, stable. 4. Sequelae of prior suboccipital craniectomy with parenchymal calcifications within the pons, cerebellum, and occipital regions bilaterally. 5. Few remote lacunar infarcts about the deep gray nuclei. CTA HEAD AND NECK: 1. Negative CTA for large vessel occlusion or other emergent finding. 2. No hemodynamically significant or correctable stenosis about the major arterial vasculature of the head and neck. 3. 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 Electronically Signed   By: Rise Mu M.D.   On: 08/19/2023 03:37   CT HEAD CODE STROKE WO CONTRAST  Result Date: 08/18/2023 CLINICAL DATA:  Code stroke. Neuro deficit, acute, stroke suspected. EXAM: CT HEAD WITHOUT CONTRAST TECHNIQUE: Contiguous axial images were obtained from the base of the skull through the vertex without intravenous contrast. RADIATION DOSE REDUCTION: This exam was performed according to the departmental dose-optimization program which includes automated exposure control, adjustment of the mA and/or kV according to patient size and/or use of iterative reconstruction technique. COMPARISON:  Head CT 02/27/2021. FINDINGS: Brain: Within limits of metal artifact from bilateral cochlear implants, no acute hemorrhage. Unchanged thin, chronic bilateral frontoparietal subdural hematomas. Unchanged scattered parenchymal calcifications, greatest in the posterior fossa and medial aspects of the bilateral occipital lobes. Old perforator infarcts in the bilateral thalami posterior limb of the left internal capsule. No new loss of gray-white differentiation. Unchanged right posterior approach ventricular shunt catheter with tip traversing the body of the left lateral ventricle. Stable size and configuration of the shunted ventricles. Stable postoperative changes of suboccipital craniotomy and resection of a posterior fossa mass. Vascular: No hyperdense  vessel or unexpected calcification. Skull: Old right frontal right parietal burr holes. Prior suboccipital craniectomy. Sinuses/Orbits: No acute findings. Other: None. ASPECTS [facility] Stroke Program Early CT Score) - Ganglionic level infarction (caudate, lentiform nuclei, internal capsule, insula, M1-M3 cortex): 7 - Supraganglionic infarction (M4-M6 cortex): 3 Total score (0-10 with 10 being normal): 10 IMPRESSION: 1. No acute intracranial hemorrhage or evidence of acute infarct. ASPECT score is 10. 2. Unchanged thin, chronic bilateral frontoparietal subdural hematomas. 3. Unchanged right posterior approach ventricular shunt catheter with stable size and configuration of the shunted ventricles. 4. Stable postoperative changes of suboccipital craniotomy and resection of a posterior fossa mass. Code stroke imaging results were communicated on 08/18/2023 at 11:26 am to provider Dr. Otelia Limes via secure text paging. Electronically Signed   By: Orvan Falconer M.D.   On: 08/18/2023 11:26    Vitals:   08/18/23 2151 08/19/23 0012 08/19/23 0342 08/19/23 0800  BP: 129/89 97/73 122/87 112/82  Pulse: 78 76 73 72  Resp:  16 16 16   Temp:  98.7 F (37.1 C) 98.1 F (36.7 C) 98.3 F (36.8 C)  TempSrc:  Oral Oral Oral  SpO2:  97% 98% 98%  Weight:      Height:         PHYSICAL EXAM General:  Alert, well-nourished, well-developed patient in no acute distress Psych:  Mood and affect appropriate for situation CV: Regular rate and rhythm on monitor Respiratory:  Regular, unlabored respirations on room air GI: Abdomen soft and nontender   NEURO:  Mental Status: AA&O to age, place and time  Speech/Language:No aphasia but moderate dysarthria with bradyphonia, following all simple commands. Able to name  and repeat   Cranial Nerves:  II: No gaze palsy, tracking bilaterally, visual field full, PERRL.  III, IV, VI: EOMI. Eyelids elevate symmetrically.  V:  Decreased sensation to light touch on left side and  symmetrical to face.  VII: Face is symmetrical resting and smiling VIII: hearing intact to voice. IX, X: Palate elevates symmetrically. Phonation is normal.  XI:3/5 on LUE. XII: tongue is midline without fasciculations. Motor: LUE 3/5 and decreased finger grip. LLE 3/5 proximal and distally. However, there is some lack of effort on exam at left. RUE and RLE 5/5.  Tone: is normal and bulk is normal Sensation- Intact to light touch bilaterally. Extinction absent to light touch to DSS.   Coordination: LUE also ataxic but not out of proportion to the weakness HKS: RUE and RLE ataxia,  No drift.  Gait- deferred  Most Recent NIH NIH Stroke Scale: 9  ASSESSMENT/PLAN  Possible right brain acute stroke, DDx including focal seizure with Todd's paralysis CT head was limited by beam artifact due to bilateral cochlear implants, but with no acute process. ASPECTS 10  CTA head & neck no LVO, no significant stenosis, markedly attenuated and irregular appearance of R SCA suspect d/t prior radiation treatment  MRI not performed due to predicted severe artifact with bilateral cochlear implant in place 2D Echo EF 60-65%, no LVH, unremarkable LDL 98 HgbA1c 5.4 VTE prophylaxis - Lovenox   UDS- positive for benzos, xanax is a daily outpatient med  EEG: w/o seizure or epileptiform discharges.  Plavix 75 mg daily prior to admission, now on aspirin 81 mg daily and clopidogrel 75 mg daily for 3 weeks and then Plavix alone. Therapy recommendations:  CIR  Disposition:  Pending   Hx of Stroke/TIA 06/2018 admitted for left thalamic infarct and left occipital punctate infarct.  Discharged on DAPT.  30-day cardiac event monitoring no A-fib. 04/2019 admitted for right cerebellar infarcts.  MRA showed right SCA occlusion, likely due to previous radiation.  Carotid Doppler negative.  EF 60 to 65%.  LDL 64, A1c 5.3.  Discharged on aspirin and Lipitor 80.  Hypertension Home meds:  metoprolol succinate 50 mg  daily Stable  Continue home metoprolol succinate 50 mg daily  Normotensive long-term BP goal   Hyperlipidemia Home meds: None  LDL 98, goal < 70 Started Lipitor 40 mg daily  Continue statin at discharge  Other Stroke Risk Factors ETOH use, alcohol level <10, advised to drink no more than 2 drink(s) a day Obesity, Body mass index is 28.43 kg/m., BMI >/= 30 associated with increased stroke risk, recommend weight loss, diet and exercise as appropriate   Other Active Problems Seizures  Home meds: trileptal 300 twice daily, continue Ependymoma s/p resection with radiation and VP shunt placement Anxiety: Xanax scheduled   Hospital day # 1    ATTENDING NOTE: I reviewed above note and agree with the assessment and plan. Pt was seen and examined.   On exam, patient awake, alert, eyes open, orientated to age, place, time. No aphasia but moderate dysarthria with bradyphonia, following all simple commands. Able to name and repeat. No gaze palsy, tracking bilaterally, visual field full, PERRL. No facial droop. Tongue midline. LUE 3/5 and decreased finger grip. LLE 3/5 proximal and distally. However, there is some lack of effort on exam at left. RUE and RLE 5/5. Sensation decreased on the left face, UE and LE. RUE and RLE ataxia, LUE also ataxic but not out of proportion to the weakness, gait not tested.  Patient admitted for left-sided weakness and numbness including face arm and leg.  Denies LOC or shaking/jerking.  EEG so far negative for seizure.  Not able to pull for MRI due to bilateral cochlear implant.  Patient symptoms likely right brain lacunar infarct, could be from vasculopathy due to previous radiation.  Continue DAPT for 3 weeks and then play alone.  Continue Trileptal for seizure prevention.  Add Lipitor.  PT and OT recommend CIR.  For detailed assessment and plan, please refer to above/below as I have made changes wherever appropriate.   Neurology will sign off. Please call with  questions. Pt will follow up with Christia Reading NP at [facility] on 11/10/23 as scheduled. Thanks for the consult.   Marvel Plan, MD PhD Stroke Neurology 08/19/2023 9:16 PM    To contact Stroke Continuity provider, please refer to [email or web address]. After hours, contact General Neurology

## 2023-08-19 NOTE — Progress Notes (Signed)
EEG complete - results pending 

## 2023-08-20 ENCOUNTER — Other Ambulatory Visit: Payer: Self-pay | Admitting: Neurology

## 2023-08-20 ENCOUNTER — Inpatient Hospital Stay (HOSPITAL_COMMUNITY): Payer: Medicare Other

## 2023-08-20 LAB — BASIC METABOLIC PANEL
Anion gap: 12 (ref 5–15)
BUN: 18 mg/dL (ref 6–20)
CO2: 19 mmol/L — ABNORMAL LOW (ref 22–32)
Calcium: 9.4 mg/dL (ref 8.9–10.3)
Chloride: 101 mmol/L (ref 98–111)
Creatinine, Ser: 1.13 mg/dL (ref 0.61–1.24)
GFR, Estimated: >60 mL/min (ref >60)
Glucose, Bld: 132 mg/dL — ABNORMAL HIGH (ref 70–99)
Potassium: 4 mmol/L (ref 3.5–5.1)
Sodium: 132 mmol/L — ABNORMAL LOW (ref 135–145)

## 2023-08-20 LAB — VITAMIN B12: Vitamin B-12: 134 pg/mL — ABNORMAL LOW (ref 180–914)

## 2023-08-20 MED ORDER — VITAMIN B-12 1000 MCG PO TABS
1000.0000 ug | ORAL_TABLET | Freq: Every day | ORAL | Status: DC
  Administered 2023-08-20 – 2023-08-23 (×4): 1000 ug via ORAL
  Filled 2023-08-20 (×4): qty 1

## 2023-08-20 MED ORDER — IOHEXOL 350 MG/ML SOLN
75.0000 mL | Freq: Once | INTRAVENOUS | Status: AC | PRN
  Administered 2023-08-20: 75 mL via INTRAVENOUS

## 2023-08-20 NOTE — Progress Notes (Signed)
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 Feels better about been able to eat. No blurry vision today. No new symptoms. Discussed with [place] that his family members are looking into moving him to a first floor apartment whenever he is ready to move back home after rehab.  Objective:  Vital signs in last 24 hours: Vitals:   08/19/23 1540 08/19/23 2318 08/20/23 0438 08/20/23 0743  BP: 114/87 104/72 96/65 124/82  Pulse: 84 78 70 71  Resp: 16 16 16 18   Temp: (!) 97.5 F (36.4 C) 98.1 F (36.7 C) 97.6 F (36.4 C) (!) 97.5 F (36.4 C)  TempSrc: Oral Oral Oral Oral  SpO2: 99% 94% 96% 97%  Weight:      Height:       Supplemental O2: Room Air SpO2: 97 % Filed Weights   08/18/23 1144 08/18/23 2000  Weight: 83 kg 84.8 kg    Physical Exam:  Constitutional: well-appearing man laying in bed, in no acute distress HENT: normocephalic atraumatic, wearing bilateral cochlear implants, mucous membranes moist Neck: supple Cardiovascular: regular rate and rhythm, no m/r/g Pulmonary/Chest: normal work of breathing on room air, lungs clear to auscultation bilaterally. No wheezing Abdominal: normal BS, soft, non-tender, non-distended MSK: No edema in bilateral lower extremities Neurological: alert & oriented x 3, symmetric face, tongue midline, equal sensation. RUE 5/5 strength. LUE 4/5 strength, unable to raise it above  the shoulder. RLE 5/5, LLE 2/5. Normal FTN. Skin: warm and dry Psych: Anxious mood and sad affect    Intake/Output Summary (Last 24 hours) at 08/20/2023 0940 Last data filed at 08/20/2023 0400 Gross per 24 hour  Intake 800 ml  Output 1275 ml  Net -475 ml   Net IO Since Admission: -1,195 mL [08/20/23 0940]  Pertinent Labs:    Latest Ref Rng & Units 08/19/2023    4:50 AM 08/18/2023   11:13 AM 08/18/2023   11:05 AM  CBC  WBC 4.0 - 10.5 K/uL 4.8   5.0   Hemoglobin 13.0 - 17.0 g/dL 02.7  25.3  66.4   Hematocrit 39.0 - 52.0 % 47.8  46.0  48.1   Platelets 150 - 400 K/uL 240   203        Latest Ref Rng & Units 08/20/2023    4:22 AM 08/19/2023    4:50 AM 08/18/2023   11:13 AM  CMP  Glucose 70 - 99 mg/dL 403  474  93   BUN 6 - 20 mg/dL 18  14  10    Creatinine 0.61 - 1.24 mg/dL 2.59  5.63  8.75   Sodium 135 - 145 mmol/L 132  132  138   Potassium 3.5 - 5.1 mmol/L 4.0  4.3  4.1   Chloride 98 - 111 mmol/L 101  102  102   CO2 22 - 32 mmol/L 19  22    Calcium 8.9 - 10.3 mg/dL 9.4  9.2    Total Protein 6.5 - 8.1 g/dL  6.6  Total Bilirubin <1.2 mg/dL  0.6    Alkaline Phos 38 - 126 U/L  75    AST 15 - 41 U/L  15    ALT 0 - 44 U/L  16      Imaging: CT LUMBAR SPINE W WO CONTRAST  Result Date: 08/20/2023 CLINICAL DATA:  Initial evaluation for lumbar radiculopathy, left lower extremity weakness, tenderness. EXAM: CT LUMBAR SPINE WITH AND WITHOUT CONTRAST TECHNIQUE: Multidetector CT imaging of the lumbar spine was performed without and with intravenous contrast administration. Multiplanar CT image reconstructions were also generated. RADIATION DOSE REDUCTION: This exam was performed according to the departmental dose-optimization program which includes automated exposure control, adjustment of the mA and/or kV according to patient size and/or use of iterative reconstruction technique. CONTRAST:  75mL OMNIPAQUE IOHEXOL 350 MG/ML SOLN COMPARISON:  Prior radiograph from 10/03/2017.  FINDINGS: Segmentation: Standard. Lowest well-formed disc space labeled the L5-S1 level. Alignment: Physiologic with preservation of the normal lumbar lordosis. No listhesis. Vertebrae: Vertebral body height well maintained without acute or chronic fracture. Visualized sacrum and pelvis intact. SI joints symmetric and within normal limits. No discrete or worrisome osseous lesions. Paraspinal and other soft tissues: Paraspinous soft tissues demonstrate no acute finding. Retained enteric contrast material noted within the visualized bowel. Flattening of the IVC noted. Mild aortic atherosclerosis. Remainder the visualized visceral structures otherwise unremarkable. No abnormal or pathologic enhancement. Disc levels: No significant disc pathology seen within the lumbar spine by CT. No appreciable disc bulging or focal disc herniation. No significant facet disease. No canal or neural foraminal stenosis or evidence for neural impingement. No abnormal or pathologic enhancement. IMPRESSION: 1. Normal CT of the lumbar spine. No significant disc pathology, stenosis, or evidence for neural impingement. No findings to explain patient's symptoms. 2.  Aortic Atherosclerosis (ICD10-I70.0). Electronically Signed   By: Rise Mu M.D.   On: 08/20/2023 05:04   DG Swallowing Func-Speech Pathology  Result Date: 08/19/2023 Table formatting from the original result was not included. Modified Barium Swallow Study Patient Details Name: [name] Love MRN: [id number] Date of Birth: 1971/06/26 Today's Date: 08/19/2023 HPI/PMH: HPI: [name] Love is a 52 yo male presenting to ED 11/14 with L sided weakness and R gaze preference. CTH negative. MRI Brain pending. PMH includes CVA 2019, anxiety, arthritis, bilateral arm weakness, bilateral hearing loss with cochlear implants, hypothyroidism, memory loss, seizures, insomnia Clinical Impression: Clinical Impression: Pt presents with a mild oropharyngeal dysphagia characterized by seemingly  reduced strength and coordination. Given consecutive sips of thin liquids, pt has reduced control and ability to protect his airway. Pt achieves adequate laryngeal vestibule closure during the initial swallow but subsequent swallows become increasingly disorganized with pt unable to coordinate or maintain laryngeal elevation. This results in silent aspiration of thin liquids (PAS 8). A chin tuck posture was effective in preventing airway invasion with thin liquids, although question pt's current ability to consistently use this strategy. Nectar thick liquids were transiently penetrated in trace quantities, which is considered a normal variant. Etiology of these acute swallowing deficits remains unknown. Recommend continuing Dys 3 texture solids and downgrading to nectar thick liquids. SLP will continue following to target dysphagia goals as able. Factors that may increase risk of adverse event in presence of aspiration Rubye Oaks & Clearance Coots 2021): Factors that may increase risk of adverse event in presence of aspiration Rubye Oaks & Clearance Coots 2021): Poor general health and/or compromised immunity; Limited mobility Recommendations/Plan: Swallowing Evaluation Recommendations Swallowing Evaluation Recommendations Recommendations: PO diet PO Diet Recommendation: Dysphagia 3 (Mechanical soft);  Mildly thick liquids (Level 2, nectar thick) Liquid Administration via: Cup; Straw Medication Administration: Whole meds with puree Supervision: Patient able to self-feed; Full supervision/cueing for swallowing strategies Swallowing strategies  : Minimize environmental distractions; Slow rate; Small bites/sips Postural changes: Position pt fully upright for meals Oral care recommendations: Oral care BID (2x/day) Treatment Plan Treatment Plan Treatment recommendations: Therapy as outlined in treatment plan below Follow-up recommendations: Acute inpatient rehab (3 hours/day) Functional status assessment: Patient has had a recent decline in  their functional status and demonstrates the ability to make significant improvements in function in a reasonable and predictable amount of time. Treatment frequency: Min 2x/week Treatment duration: 2 weeks Interventions: Aspiration precaution training; Compensatory techniques; Patient/family education; Trials of upgraded texture/liquids; Diet toleration management by SLP Recommendations Recommendations for follow up therapy are one component of a multi-disciplinary discharge planning process, led by the attending physician.  Recommendations may be updated based on patient status, additional functional criteria and insurance authorization. Assessment: Orofacial Exam: Orofacial Exam Oral Cavity: Oral Hygiene: WFL Oral Cavity - Dentition: Adequate natural dentition Orofacial Anatomy: WFL Oral Motor/Sensory Function: WFL Anatomy: Anatomy: WFL Boluses Administered: Boluses Administered Boluses Administered: Thin liquids (Level 0); Mildly thick liquids (Level 2, nectar thick); Moderately thick liquids (Level 3, honey thick); Puree; Solid  Oral Impairment Domain: Oral Impairment Domain Lip Closure: Interlabial escape, no progression to anterior lip Tongue control during bolus hold: Cohesive bolus between tongue to palatal seal Bolus preparation/mastication: Timely and efficient chewing and mashing Bolus transport/lingual motion: Brisk tongue motion Oral residue: Trace residue lining oral structures Location of oral residue : Tongue Initiation of pharyngeal swallow : Posterior laryngeal surface of the epiglottis  Pharyngeal Impairment Domain: Pharyngeal Impairment Domain Soft palate elevation: No bolus between soft palate (SP)/pharyngeal wall (PW) Laryngeal elevation: Complete superior movement of thyroid cartilage with complete approximation of arytenoids to epiglottic petiole Anterior hyoid excursion: Complete anterior movement Epiglottic movement: Complete inversion Laryngeal vestibule closure: Complete, no air/contrast  in laryngeal vestibule Pharyngeal stripping wave : Present - complete Pharyngeal contraction (A/P view only): N/A Pharyngoesophageal segment opening: Partial distention/partial duration, partial obstruction of flow Tongue base retraction: Trace column of contrast or air between tongue base and PPW Pharyngeal residue: Trace residue within or on pharyngeal structures Location of pharyngeal residue: Tongue base; Valleculae; Pharyngeal wall  Esophageal Impairment Domain: Esophageal Impairment Domain Esophageal clearance upright position: Complete clearance, esophageal coating Pill: No data recorded Penetration/Aspiration Scale Score: Penetration/Aspiration Scale Score 1.  Material does not enter airway: Moderately thick liquids (Level 3, honey thick); Puree; Solid 2.  Material enters airway, remains ABOVE vocal cords then ejected out: Mildly thick liquids (Level 2, nectar thick) 8.  Material enters airway, passes BELOW cords without attempt by patient to eject out (silent aspiration) : Thin liquids (Level 0) Compensatory Strategies: Compensatory Strategies Compensatory strategies: Yes Straw: Effective; Ineffective Effective Straw: Mildly thick liquid (Level 2, nectar thick) Ineffective Straw: Thin liquid (Level 0) Chin tuck: Effective Effective Chin Tuck: Thin liquid (Level 0) Left head turn: Ineffective Ineffective Left Head Turn: Thin liquid (Level 0)   General Information: Caregiver present: No  Diet Prior to this Study: Dysphagia 3 (mechanical soft); Thin liquids (Level 0)   Temperature : Normal   Respiratory Status: WFL   Supplemental O2: None (Room air)   History of Recent Intubation: No  Behavior/Cognition: Alert; Cooperative; Pleasant mood Self-Feeding Abilities: Able to self-feed Baseline vocal quality/speech: Normal Volitional Cough: Able to elicit Volitional Swallow: Able to elicit Exam Limitations: No limitations Goal Planning: Prognosis  for improved oropharyngeal function: Good Barriers to Reach Goals: Time  post onset No data recorded Patient/Family Stated Goal: none stated Consulted and agree with results and recommendations: Patient; Nurse Pain: Pain Assessment Pain Assessment: No/denies pain End of Session: Start Time:SLP Start Time (ACUTE ONLY): 1458 Stop Time: SLP Stop Time (ACUTE ONLY): 1518 Time Calculation:SLP Time Calculation (min) (ACUTE ONLY): 20 min Charges: SLP Evaluations $ SLP Speech Visit: 1 Visit SLP Evaluations $BSS Swallow: 1 Procedure $MBS Swallow: 1 Procedure $Swallowing Treatment: 1 Procedure SLP visit diagnosis: SLP Visit Diagnosis: Dysphagia, oropharyngeal phase (R13.12) Past Medical History: Past Medical History: Diagnosis Date  Abnormal gait   Acute arterial ischemic stroke, vertebrobasilar, thalamic (HCC) 06/08/2018  Acute CVA (cerebrovascular accident) (HCC) 06/07/2018  Allergic rhinitis   Anxiety   Arthritis 11/05/2013  Atypical chest pain 11/30/2017  Bilateral arm weakness   Bilateral hearing loss 07/28/2020  Brain cancer (HCC)   Cerebral thrombosis with cerebral infarction 04/17/2019  Cochlear implant in place with multiple channels 05/15/2020  CVA (cerebrovascular accident) (HCC) 04/18/2019  Depression   Diplopia   Dyspnea on exertion 11/30/2017  Ependymoma of brain (HCC)   Episode of change in speech 04/17/2019  Generalized anxiety disorder 11/28/2018  Headache 11/05/2013  Hearing loss, sensorineural   Hemiparesis and alteration of sensations as late effects of stroke (HCC) 09/04/2018  History of stroke 04/01/2020  Hypothyroidism   Insomnia   Left leg weakness   Low vitamin B12 level   Memory loss 11/05/2013  Myalgia 04/18/2017  OSA on CPAP 11/28/2018  Recurrent falls   Refusal of blood transfusions as patient is Jehovah's Witness 04/01/2020  Seizure (HCC)   Seizures (HCC)   Sleeping difficulty 11/05/2013  Stroke-like symptoms   Ventricular ectopy 06/04/2020  Weakness 04/16/2019  Weakness generalized 04/17/2019 Past Surgical History: Past Surgical History: Procedure Laterality Date  BRAIN SURGERY    HERNIA REPAIR     SHUNT REVISION   Gwynneth Aliment, M.A., CF-SLP Speech Language Pathology, Acute Rehabilitation Services Secure Chat preferred [phone] 08/19/2023, 3:43 PM  ECHOCARDIOGRAM LIMITED  Result Date: 08/19/2023    ECHOCARDIOGRAM LIMITED REPORT   Patient Name:   Aisea Berkel Date of Exam: 08/19/2023 Medical Rec #:  [id number]  Height:       68.0 in Accession #:    [id number] Weight:       186.9 lb Date of Birth:  [birth year]  BSA:          1.986 m Patient Age:    52 years   BP:           112/82 mmHg Patient Gender: M          HR:           79 bpm. Exam Location:  Inpatient Procedure: Limited Echo and Limited Color Doppler Indications:    Abnormal ECG  History:        Patient has prior history of Echocardiogram examinations, most                 recent 03/16/2023. Stroke; Risk Factors:Sleep Apnea. Seizures,                 hypothyroidism, brain cancer.  Sonographer:    Milda Smart Referring Phys: [id number] NISCHAL NARENDRA IMPRESSIONS  1. Left ventricular ejection fraction, by estimation, is 60 to 65%. The left ventricle has normal function. The left ventricle has no regional wall motion abnormalities. Left ventricular diastolic function could not be evaluated.  2. Right ventricular systolic function is normal. The right ventricular  size is normal. Tricuspid regurgitation signal is inadequate for assessing PA pressure.  3. The mitral valve is normal in structure. No evidence of mitral valve regurgitation.  4. The aortic valve is normal in structure. Aortic valve regurgitation is not visualized.  5. The inferior vena cava is normal in size with greater than 50% respiratory variability, suggesting right atrial pressure of 3 mmHg. FINDINGS  Left Ventricle: Left ventricular ejection fraction, by estimation, is 60 to 65%. The left ventricle has normal function. The left ventricle has no regional wall motion abnormalities. The left ventricular internal cavity size was normal in size. There is  no left ventricular hypertrophy.  Left ventricular diastolic function could not be evaluated. Right Ventricle: The right ventricular size is normal. No increase in right ventricular wall thickness. Right ventricular systolic function is normal. Tricuspid regurgitation signal is inadequate for assessing PA pressure. Left Atrium: Left atrial size was normal in size. Right Atrium: Right atrial size was normal in size. Pericardium: There is no evidence of pericardial effusion. Mitral Valve: The mitral valve is normal in structure. Tricuspid Valve: The tricuspid valve is normal in structure. Tricuspid valve regurgitation is trivial. No evidence of tricuspid stenosis. Aortic Valve: The aortic valve is normal in structure. Aortic valve regurgitation is not visualized. Pulmonic Valve: The pulmonic valve was normal in structure. Pulmonic valve regurgitation is trivial. No evidence of pulmonic stenosis. Aorta: Aortic root could not be assessed. Venous: The inferior vena cava is normal in size with greater than 50% respiratory variability, suggesting right atrial pressure of 3 mmHg. IAS/Shunts: No atrial level shunt detected by color flow Doppler. Additional Comments: Color Doppler performed.  LEFT VENTRICLE PLAX 2D LVIDd:         4.10 cm LVIDs:         2.70 cm LV PW:         1.00 cm LV IVS:        1.00 cm LVOT diam:     2.20 cm LVOT Area:     3.80 cm  IVC IVC diam: 1.30 cm LEFT ATRIUM         Index LA diam:    3.00 cm 1.51 cm/m   AORTA Ao Root diam: 2.90 cm Ao Asc diam:  2.70 cm  SHUNTS Systemic Diam: 2.20 cm Armanda Magic MD Electronically signed by Armanda Magic MD Signature Date/Time: 08/19/2023/10:14:24 AM    Final     Assessment/Plan:   Principal Problem:   Cerebral infarction (HCC)   Probable CVA Hx of vertebrobasilar/thalamic infarct with R sided hemiparesis Worsening LLE weakness Dysphagia Unchanged exam from prior today. CT head without evidence of acute infarct. Decision to not pursue MRI brain as there is potential for poor study due to  bilateral cochlear implants and reposition of VP shunt post MRI. Neurology consulted; symptoms likely explained from R lacunar infarct or from vasculopathy 2/2 prior radiation. CT lumbar/sacral spine without acute abnormalities to explain symptoms.  -TTE with normal EF and normal valve function. -EEG without evidence of seizures -Continue on cardiac telemetry -ASA and Plavix for 3 weeks, then Plavix indefinitely -Atorvastatin 40 mg daily -SLP evaluated; s/p MBS: on dysphagia 3 diet and thick nectar fluid -PT recommends SNF for long term rehabilitation -Neurology has signed off  Seizure disorder -Continue on Oxcarbazepine 300 mg BID  B12 deficiency 134 today. -Start 1000 mcg B12 daily  Hypertension Hx of ventricular ectopy -Continue on metoprolol succinate 50 mg daily  Anxiety Last change in dose over a year ago. Continue  on Xanax 0.5 mg BID  Diet:  Dysphagia 3 diet and thick nectar fluids VTE: Enoxaparin Code: Full PT/OT recs: SNF for Subacute PT,  Dispo: Anticipated discharge to Skilled nursing facility in 1-2 days pending SNF availability  Morene Crocker, MD Internal Medicine Resident PGY-2 Please contact the on call pager after 5 pm and on weekends at [phone].

## 2023-08-20 NOTE — Progress Notes (Addendum)
Inpatient Rehab Admissions Coordinator:  Discussed pt appropriateness for CIR with Dr. Riley Kill. Pt's medical workup negative for CVA. Also note pt with multiple falls at baseline and pt lives alone. Pt is more appropriate for long-term therapy. Other rehab venues should be pursued. TOC made aware.   Wolfgang Phoenix, MS, CCC-SLP Admissions Coordinator [phone]

## 2023-08-20 NOTE — Plan of Care (Signed)

## 2023-08-20 NOTE — Plan of Care (Signed)

## 2023-08-21 DIAGNOSIS — I639 Cerebral infarction, unspecified: Secondary | ICD-10-CM | POA: Diagnosis not present

## 2023-08-21 DIAGNOSIS — R1310 Dysphagia, unspecified: Secondary | ICD-10-CM

## 2023-08-21 DIAGNOSIS — E538 Deficiency of other specified B group vitamins: Secondary | ICD-10-CM | POA: Insufficient documentation

## 2023-08-21 LAB — GLUCOSE, CAPILLARY
Glucose-Capillary: 137 mg/dL — ABNORMAL HIGH (ref 70–99)
Glucose-Capillary: 112 mg/dL — ABNORMAL HIGH (ref 70–99)
Glucose-Capillary: 103 mg/dL — ABNORMAL HIGH (ref 70–99)
Glucose-Capillary: 140 mg/dL — ABNORMAL HIGH (ref 70–99)

## 2023-08-21 MED ORDER — ACETAMINOPHEN 500 MG PO TABS
1000.0000 mg | ORAL_TABLET | Freq: Four times a day (QID) | ORAL | Status: DC
  Administered 2023-08-21 – 2023-08-23 (×9): 1000 mg via ORAL
  Filled 2023-08-21 (×8): qty 2

## 2023-08-21 MED ORDER — ACETAMINOPHEN 500 MG PO TABS
ORAL_TABLET | ORAL | Status: AC
  Filled 2023-08-21: qty 2

## 2023-08-21 MED ORDER — GABAPENTIN 300 MG PO CAPS
300.0000 mg | ORAL_CAPSULE | Freq: Once | ORAL | Status: AC
Start: 2023-08-21 — End: 2023-08-21
  Administered 2023-08-21: 300 mg via ORAL
  Filled 2023-08-21: qty 1

## 2023-08-21 NOTE — Progress Notes (Signed)
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 intake with new dysphagia 3 diet and thick nectar liquids.  Has continued to discussed with healthcare power of attorney about options to adapt to care needs.  Discussed his willingness to continue working towards gaining strength and recognizes that his need for more assistance moving forward.  Shared with [place] that he has a service dog that has been missing him and he has been missing her.  Show some resignation at the fact that he will likely will need long-term care.   Objective:  Vital signs in last 24 hours: Vitals:   08/21/23 0443 08/21/23 0454 08/21/23 0857 08/21/23 1100  BP:  90/61 94/65 96/71   Pulse: 80 70 80 72  Resp:  18 16 14   Temp:  (!) 97.5 F (36.4 C) 97.6 F (36.4 C) 97.6 F (36.4 C)  TempSrc:  Oral Oral Oral  SpO2: 95% 96% 96% 97%  Weight:      Height:       Supplemental O2: Room Air SpO2: 97 % Filed Weights   08/18/23 1144 08/18/23 2000  Weight: 83 kg 84.8 kg    Physical Exam:  Constitutional: Well-appearing man laying in bed in no acute distress HENT: Wearing reading glasses and bilateral cochlear implants with moist mucous membrane Cardiovascular: Regular rate and rhythm  Pulmonary/Chest: Normal work of breathing on room air, lungs clear to auscultation anteriorly and laterally MSK: No edema in bilateral lower extremities Neurological: Patient is alert and oriented , with  good insight to his clinical status and overall progression of disease.  His speech is delayed, stable at baseline, movements are also delayed with the creased smoothness in fine motor movements, stable for patient.  He has symmetric face, mild dysarthria, stable. RUE 5/5 strength. LUE 4/5 strength, unable to raise it above the shoulder, stable. RLE 5/5, LLE 3/5, improved Skin: warm and dry Psych: Sad mood and sad affect    Intake/Output Summary (Last 24 hours) at 08/21/2023 1232 Last data filed at 08/21/2023 0734 Gross per 24 hour  Intake 360 ml  Output 700 ml  Net -340 ml   Net IO Since Admission: -1,815 mL [08/21/23 1232]  Pertinent Labs:    Latest Ref Rng & Units 08/19/2023    4:50 AM 08/18/2023   11:13 AM 08/18/2023   11:05 AM  CBC  WBC 4.0 - 10.5 K/uL 4.8   5.0   Hemoglobin 13.0 - 17.0 g/dL 16.1  09.6  04.5   Hematocrit 39.0 - 52.0 % 47.8  46.0  48.1   Platelets 150 - 400 K/uL 240   203        Latest Ref Rng & Units 08/20/2023    4:22 AM 08/19/2023    4:50 AM 08/18/2023   11:13 AM  CMP  Glucose 70 - 99 mg/dL 409  811  93   BUN 6 - 20 mg/dL 18  14  10    Creatinine 0.61 - 1.24 mg/dL 9.14  7.82  9.56   Sodium 135 - 145  mmol/L 132  132  138   Potassium 3.5 - 5.1 mmol/L 4.0  4.3  4.1   Chloride 98 - 111 mmol/L 101  102  102   CO2 22 - 32 mmol/L 19  22    Calcium 8.9 - 10.3 mg/dL 9.4  9.2    Total Protein 6.5 - 8.1 g/dL  6.6    Total Bilirubin <1.2 mg/dL  0.6    Alkaline Phos 38 - 126 U/L  75    AST 15 - 41 U/L  15    ALT 0 - 44 U/L  16      Imaging: No results found.  Assessment/Plan:   Principal Problem:   Cerebral infarction [facility]) Active Problems:   B12 deficiency  Clinical stroke Suspected right lacunar CVA Hx of vertebrobasilar/thalamic infarct with R sided hemiparesis Worsening LLE weakness Dysphagia, improved on diet  CT head without evidence of acute infarct. Decision to not pursue MRI brain as there is potential for poor study due to bilateral  cochlear implants and reposition of VP shunt post MRI. Neurology consulted; symptoms likely explained from R lacunar infarct or from vasculopathy 2/2 prior radiation. CT lumbar/sacral spine without acute abnormalities to explain symptoms.  Currently patient is being managed for the clinical symptoms of a CVA; considered repeating CT head as it may now show ischemic infarct; however, likely not to change current management of disease.  Of note, patient demonstrates  improvement in left lower extremity strength today compared to prior, now able to move distal  lower extremity against gravity.  -TTE with normal EF and normal valve function. -EEG without evidence of seizures -Continue on cardiac telemetry -ASA and Plavix for 3 weeks, then Plavix indefinitely -Atorvastatin 40 mg daily -SLP evaluated; s/p MBS: on dysphagia 3 diet and thick nectar fluid -PT recommends SNF for long term rehabilitation -Discontinue telemetry today  Seizure disorder -Continue on Oxcarbazepine 300 mg BID  B12 deficiency B12 levels 134 during this admission.  Unlikely explained acute on symptoms on admission.  -Continue 1000 mcg B12 daily  Hypertension Hx of ventricular ectopy -Continue on metoprolol succinate 50 mg daily  Anxiety Last change in dose over a year ago. High risk for depressive episode with life transition and ongoing difficult life adjustments.  -Continue on Xanax 0.5 mg BID -Consider starting SSRI during this admission  Diet:  Dysphagia 3 diet and thick nectar fluids VTE: Enoxaparin Code: Full PT/OT recs: SNF for Subacute PT,  Dispo: Anticipated discharge to Skilled nursing facility in 1-2 days pending SNF availability  Morene Crocker, MD Internal Medicine Resident PGY-2 Please contact the on call pager after 5 pm and on weekends at [phone].

## 2023-08-21 NOTE — Plan of Care (Signed)
  Problem: Health Behavior/Discharge Planning: Goal: Ability to manage health-related needs will improve Outcome: Progressing   

## 2023-08-21 NOTE — Plan of Care (Signed)
  Problem: Education: Goal: Knowledge of General Education information will improve Description: Including pain rating scale, medication(s)/side effects and non-pharmacologic comfort measures Outcome: Progressing   Problem: Health Behavior/Discharge Planning: Goal: Ability to manage health-related needs will improve Outcome: Progressing   Problem: Clinical Measurements: Goal: Ability to maintain clinical measurements within normal limits will improve Outcome: Progressing Goal: Will remain free from infection Outcome: Progressing Goal: Diagnostic test results will improve Outcome: Progressing Goal: Respiratory complications will improve Outcome: Progressing Goal: Cardiovascular complication will be avoided Outcome: Progressing   Problem: Activity: Goal: Risk for activity intolerance will decrease Outcome: Progressing   Problem: Nutrition: Goal: Adequate nutrition will be maintained Outcome: Progressing   Problem: Coping: Goal: Level of anxiety will decrease Outcome: Progressing   Problem: Elimination: Goal: Will not experience complications related to bowel motility Outcome: Progressing Goal: Will not experience complications related to urinary retention Outcome: Progressing   Problem: Pain Management: Goal: General experience of comfort will improve Outcome: Progressing   Problem: Safety: Goal: Ability to remain free from injury will improve Outcome: Progressing   Problem: Skin Integrity: Goal: Risk for impaired skin integrity will decrease Outcome: Progressing   Problem: Education: Goal: Knowledge of disease or condition will improve Outcome: Progressing Goal: Knowledge of secondary prevention will improve (MUST DOCUMENT ALL) Outcome: Progressing Goal: Knowledge of patient specific risk factors will improve Loraine Leriche N/A or DELETE if not current risk factor) Outcome: Progressing   Problem: Ischemic Stroke/TIA Tissue Perfusion: Goal: Complications of ischemic  stroke/TIA will be minimized Outcome: Progressing   Problem: Coping: Goal: Will verbalize positive feelings about self Outcome: Progressing   Problem: Self-Care: Goal: Ability to participate in self-care as condition permits will improve Outcome: Progressing Goal: Verbalization of feelings and concerns over difficulty with self-care will improve Outcome: Progressing Goal: Ability to communicate needs accurately will improve Outcome: Progressing   Problem: Nutrition: Goal: Risk of aspiration will decrease Outcome: Progressing Goal: Dietary intake will improve Outcome: Progressing

## 2023-08-21 NOTE — Progress Notes (Signed)
   08/21/23 1245  Pain Assessment  Pain Scale 0-10  Pain Score 10  Pain Type Acute pain  Pain Location Head  Pain Descriptors / Indicators Aching  Pain Intervention(s) Medication (See eMAR)  Provider Notification  Provider Name/Title Leitha Bleak MD  Date Provider Notified 08/21/23  Time Provider Notified 1245  Method of Notification  (Secure chat)  Notification Reason Other (Comment) (Severe Headache)  Provider response En route  Date of Provider Response 08/21/23  Time of Provider Response 1246

## 2023-08-21 NOTE — Progress Notes (Signed)
Paged by RN about HA. Examined at bedside. Patient with difficulty swallowing tylenol medication. Was a bit anxious but able to swallow pill. Neuro exam unchanged from prior. Patient reassured. Increased dosing of tylenol. Instructions to page covering IMTS resident if persistent HA for repeat neuro exam. No changes in plan.

## 2023-08-21 NOTE — TOC Initial Note (Signed)
Transition of Care [facility]) - Initial/Assessment Note    Patient Details  Name: [name] Love MRN: [id number] Date of Birth: 29-Jul-1971  Transition of Care [facility]) CM/SW Contact:    Baldemar Lenis, LCSW Phone Number: 08/21/2023, 10:33 AM  Clinical Narrative:       Patient from home alone, wheelchair bound. Patient in agreement with recommendation for SNF. CSW completed referral, faxed out, will follow with bed offers.            Expected Discharge Plan: Skilled Nursing Facility Barriers to Discharge: Continued Medical Work up, English as a second language teacher   Patient Goals and CMS Choice Patient states their goals for this hospitalization and ongoing recovery are:: to get rehab CMS Medicare.gov Compare Post Acute Care list provided to:: Patient Choice offered to / list presented to : Patient [place] ownership interest in [facility]provided to:: Patient    Expected Discharge Plan and Services     Post Acute Care Choice: Skilled Nursing Facility Living arrangements for the past 2 months: Apartment                                      Prior Living Arrangements/Services Living arrangements for the past 2 months: Apartment Lives with:: Self Patient language and need for interpreter reviewed:: No Do you feel safe going back to the place where you live?: Yes      Need for Family Participation in Patient Care: No (Comment) Care giver support system in place?: No (comment) Current home services: DME Criminal Activity/Legal Involvement Pertinent to Current Situation/Hospitalization: No - Comment as needed  Activities of Daily Living      Permission Sought/Granted Permission sought to share information with : Facility Industrial/product designer granted to share information with : Yes, Verbal Permission Granted     Permission granted to share info w AGENCY: SNF        Emotional Assessment Appearance:: Appears stated age Attitude/Demeanor/Rapport:  Engaged Affect (typically observed): Appropriate Orientation: : Oriented to Self, Oriented to Place, Oriented to  Time, Oriented to Situation Alcohol / Substance Use: Not Applicable Psych Involvement: No (comment)  Admission diagnosis:  Cerebral infarction [facility]) [I63.9] Cerebrovascular accident (CVA), unspecified mechanism (HCC) [I63.9] Patient Active Problem List   Diagnosis Date Noted   B12 deficiency 08/21/2023   Cerebral infarction (HCC) 08/18/2023   Bilateral impacted cerumen 05/21/2022   Cholesteatoma of right external auditory canal 05/21/2022   Otalgia of both ears 05/21/2022   Disorder of left external ear 11/26/2021   S/P VP shunt 11/17/2021   Nonsustained ventricular tachycardia (HCC) 03/23/2021   Bilateral hearing loss 07/28/2020   Low vitamin B12 level    Left leg weakness    Insomnia    Hypothyroidism    Hearing loss, sensorineural    Ependymoma of brain (HCC)    Depression    Brain cancer (HCC)    Bilateral arm weakness    Anxiety    Abnormal gait    Ventricular ectopy 06/04/2020   Cochlear implant in place with multiple channels 05/15/2020   History of stroke 04/01/2020   Refusal of blood transfusions as patient is Jehovah's Witness 04/01/2020   CVA (cerebrovascular accident) (HCC) 04/18/2019   Stroke-like symptoms    Episode of change in speech 04/17/2019   Weakness generalized 04/17/2019   Cerebral thrombosis with cerebral infarction 04/17/2019   Weakness 04/16/2019   Generalized anxiety disorder 11/28/2018   OSA on CPAP  11/28/2018   Hemiparesis and alteration of sensations as late effects of stroke (HCC) 09/04/2018   Acute arterial ischemic stroke, vertebrobasilar, thalamic (HCC) 06/08/2018   Seizure (HCC)    Recurrent falls    Diplopia    Acute CVA (cerebrovascular accident) (HCC) 06/07/2018   Allergic rhinitis 04/03/2018   Atypical chest pain 11/30/2017   Dyspnea on exertion 11/30/2017   Myalgia 04/18/2017   Seizures (HCC) 01/20/2017    Arthritis 11/05/2013   Headache 11/05/2013   Memory loss 11/05/2013   Sleeping difficulty 11/05/2013   PCP:  Ailene Ravel, MD Pharmacy:   [facility] [phone] - [place], [place] - [address] AT [facility] [address] [place] [address] Phone: [phone] Fax: [phone]     Social Determinants of Health (SDOH) Social History: SDOH Screenings   Food Insecurity: Low Risk  (03/17/2023)   Received from [facility]  Utilities: Low Risk  (03/17/2023)   Received from [facility]  Financial Resource Strain: Low Risk  (06/07/2018)  Physical Activity: Unknown (06/07/2018)  Social Connections: Unknown (06/07/2018)  Stress: No Stress Concern Present (06/07/2018)  Tobacco Use: Low Risk  (07/15/2023)   Received from [facility]   SDOH Interventions:     Readmission Risk Interventions     No data to display

## 2023-08-21 NOTE — Progress Notes (Signed)
Reggie Briscoe [phone] is patient HPOA dated 08/20/23

## 2023-08-21 NOTE — NC FL2 (Signed)
[place] MEDICAID FL2 LEVEL OF CARE FORM     IDENTIFICATION  Patient Name: [name] Love Birthdate: 1971/05/13 Sex: male Admission Date (Current Location): 08/18/2023  [place] and [place] Number:  [employer] and Address:  [facility], [address], [place], [place] [address]      Provider Number: [id number]  Attending Physician Name and Address:  Miguel Aschoff, MD  Relative Name and Phone Number:       Current Level of Care: Hospital Recommended Level of Care: Skilled Nursing Facility Prior Approval Number:    Date Approved/Denied:   PASRR Number: [id number]  Discharge Plan: SNF    Current Diagnoses: Patient Active Problem List   Diagnosis Date Noted   B12 deficiency 08/21/2023   Cerebral infarction (HCC) 08/18/2023   Bilateral impacted cerumen 05/21/2022   Cholesteatoma of right external auditory canal 05/21/2022   Otalgia of both ears 05/21/2022   Disorder of left external ear 11/26/2021   S/P VP shunt 11/17/2021   Nonsustained ventricular tachycardia (HCC) 03/23/2021   Bilateral hearing loss 07/28/2020   Low vitamin B12 level    Left leg weakness    Insomnia    Hypothyroidism    Hearing loss, sensorineural    Ependymoma of brain (HCC)    Depression    Brain cancer (HCC)    Bilateral arm weakness    Anxiety    Abnormal gait    Ventricular ectopy 06/04/2020   Cochlear implant in place with multiple channels 05/15/2020   History of stroke 04/01/2020   Refusal of blood transfusions as patient is Jehovah's Witness 04/01/2020   CVA (cerebrovascular accident) (HCC) 04/18/2019   Stroke-like symptoms    Episode of change in speech 04/17/2019   Weakness generalized 04/17/2019   Cerebral thrombosis with cerebral infarction 04/17/2019   Weakness 04/16/2019   Generalized anxiety disorder 11/28/2018   OSA on CPAP 11/28/2018   Hemiparesis and alteration of sensations as late effects of stroke (HCC) 09/04/2018   Acute  arterial ischemic stroke, vertebrobasilar, thalamic (HCC) 06/08/2018   Seizure (HCC)    Recurrent falls    Diplopia    Acute CVA (cerebrovascular accident) (HCC) 06/07/2018   Allergic rhinitis 04/03/2018   Atypical chest pain 11/30/2017   Dyspnea on exertion 11/30/2017   Myalgia 04/18/2017   Seizures (HCC) 01/20/2017   Arthritis 11/05/2013   Headache 11/05/2013   Memory loss 11/05/2013   Sleeping difficulty 11/05/2013    Orientation RESPIRATION BLADDER Height & Weight     Self, Time, Situation, Place  Normal Continent Weight: 186 lb 15.2 oz (84.8 kg) Height:  5\' 8"  (172.7 cm)  BEHAVIORAL SYMPTOMS/MOOD NEUROLOGICAL BOWEL NUTRITION STATUS    Convulsions/Seizures Continent Diet (see DC summary)  AMBULATORY STATUS COMMUNICATION OF NEEDS Skin   Extensive Assist Verbally Normal                       Personal Care Assistance Level of Assistance  Bathing, Feeding, Dressing Bathing Assistance: Maximum assistance Feeding assistance: Limited assistance Dressing Assistance: Maximum assistance     Functional Limitations Info  Speech, Hearing, Sight Sight Info: Impaired Hearing Info: Impaired (cochlear implants) Speech Info: Impaired (dysarthria)    SPECIAL CARE FACTORS FREQUENCY  PT (By licensed PT), OT (By licensed OT)     PT Frequency: 5x/wk OT Frequency: 5x/wk            Contractures Contractures Info: Not present    Additional Factors Info  Code Status, Allergies,  Psychotropic Code Status Info: Full Allergies Info: Baclofen, Seroquel (Quetiapine), Sulfa Antibiotics, Coconut Flavor, Contrast Media (Iodinated Contrast Media) Psychotropic Info: Xanax 0.5mg  2x/day         Current Medications (08/21/2023):  This is the current hospital active medication list Current Facility-Administered Medications  Medication Dose Route Frequency Provider Last Rate Last Admin   acetaminophen (TYLENOL) tablet 650 mg  650 mg Oral Q6H Alexander-Savino, [place], MD   650 mg at  08/21/23 1610   ALPRAZolam Prudy Feeler) tablet 0.5 mg  0.5 mg Oral BID Alexander-Savino, [place], MD   0.5 mg at 08/21/23 0944   aspirin EC tablet 81 mg  81 mg Oral Daily Alexander-Savino, [place], MD   81 mg at 08/21/23 0944   atorvastatin (LIPITOR) tablet 40 mg  40 mg Oral Daily Alexander-Savino, [place], MD   40 mg at 08/21/23 0944   clopidogrel (PLAVIX) tablet 75 mg  75 mg Oral Daily Alexander-Savino, [place], MD   75 mg at 08/21/23 0944   cyanocobalamin (VITAMIN B12) tablet 1,000 mcg  1,000 mcg Oral Daily Morene Crocker, MD   1,000 mcg at 08/21/23 0944   enoxaparin (LOVENOX) injection 40 mg  40 mg Subcutaneous Q24H Marrianne Mood, MD   40 mg at 08/20/23 1810   lidocaine (LIDODERM) 5 % 2 patch  2 patch Transdermal Q24H Alexander-Savino, [place], MD   2 patch at 08/20/23 1816   metoprolol succinate (TOPROL-XL) 24 hr tablet 50 mg  50 mg Oral Daily Alexander-Savino, [place], MD   50 mg at 08/21/23 0944   OXcarbazepine (TRILEPTAL) 300 MG/5ML suspension 300 mg  300 mg Oral BID Caryl Pina, MD   300 mg at 08/21/23 0945   polyethylene glycol (MIRALAX / GLYCOLAX) packet 17 g  17 g Oral Daily Modena Slater, DO   17 g at 08/21/23 0945   senna-docusate (Senokot-S) tablet 1 tablet  1 tablet Oral QHS Modena Slater, DO   1 tablet at 08/20/23 2220     Discharge Medications: Please see discharge summary for a list of discharge medications.  Relevant Imaging Results:  Relevant Lab Results:   Additional Information SS#: [id number]  Baldemar Lenis, LCSW

## 2023-08-22 ENCOUNTER — Encounter (HOSPITAL_COMMUNITY): Payer: Self-pay | Admitting: Internal Medicine

## 2023-08-22 DIAGNOSIS — I639 Cerebral infarction, unspecified: Secondary | ICD-10-CM | POA: Diagnosis not present

## 2023-08-22 DIAGNOSIS — R1310 Dysphagia, unspecified: Secondary | ICD-10-CM | POA: Diagnosis not present

## 2023-08-22 LAB — BASIC METABOLIC PANEL
Anion gap: 8 (ref 5–15)
BUN: 18 mg/dL (ref 6–20)
CO2: 24 mmol/L (ref 22–32)
Calcium: 8.6 mg/dL — ABNORMAL LOW (ref 8.9–10.3)
Chloride: 104 mmol/L (ref 98–111)
Creatinine, Ser: 1.27 mg/dL — ABNORMAL HIGH (ref 0.61–1.24)
GFR, Estimated: >60 mL/min (ref >60)
Glucose, Bld: 91 mg/dL (ref 70–99)
Potassium: 4 mmol/L (ref 3.5–5.1)
Sodium: 136 mmol/L (ref 135–145)

## 2023-08-22 LAB — CBC
HCT: 43.3 % (ref 39.0–52.0)
Hemoglobin: 14.4 g/dL (ref 13.0–17.0)
MCH: 30.1 pg (ref 26.0–34.0)
MCHC: 33.3 g/dL (ref 30.0–36.0)
MCV: 90.4 fL (ref 80.0–100.0)
Platelets: 203 10*3/uL (ref 150–400)
RBC: 4.79 MIL/uL (ref 4.22–5.81)
RDW: 12 % (ref 11.5–15.5)
WBC: 6.1 10*3/uL (ref 4.0–10.5)
nRBC: 0 % (ref 0.0–0.2)

## 2023-08-22 LAB — 10-HYDROXYCARBAZEPINE: Triliptal/MTB(Oxcarbazepin): 9 ug/mL — ABNORMAL LOW (ref 10–35)

## 2023-08-22 LAB — GLUCOSE, CAPILLARY: Glucose-Capillary: 102 mg/dL — ABNORMAL HIGH (ref 70–99)

## 2023-08-22 MED ORDER — DULOXETINE HCL 30 MG PO CPEP
30.0000 mg | ORAL_CAPSULE | Freq: Every day | ORAL | Status: DC
  Administered 2023-08-22 – 2023-08-23 (×2): 30 mg via ORAL
  Filled 2023-08-22 (×2): qty 1

## 2023-08-22 MED ORDER — GABAPENTIN 300 MG PO CAPS
300.0000 mg | ORAL_CAPSULE | Freq: Once | ORAL | Status: AC
  Administered 2023-08-22: 300 mg via ORAL
  Filled 2023-08-22: qty 1

## 2023-08-22 MED ORDER — GABAPENTIN 300 MG PO CAPS
300.0000 mg | ORAL_CAPSULE | Freq: Three times a day (TID) | ORAL | Status: DC
  Administered 2023-08-22 – 2023-08-23 (×3): 300 mg via ORAL
  Filled 2023-08-22 (×3): qty 1

## 2023-08-22 NOTE — Progress Notes (Signed)
Subjective:   Summary: [name] Love is a 52 y.o. 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   Overnight Events: Received gabapentin for right lower extremity pain   Patient seen at bedside this am. Requests MD to call [name] Love, his cousin and power of attorney. Patient reports some continued leg pain though feels that the gabapentin is helpful. Patient became tearful when discussing his guide dog whom he has had for many years. Discussed starting Cymbalta for dual purpose of pain management and for treatment of post-stroke depression, which patient agreed to.   Objective:  Vital signs in last 24 hours: Vitals:   08/21/23 2008 08/22/23 0016 08/22/23 0401 08/22/23 0842  BP: 103/71 93/62 94/69  99/67  Pulse: 72 68 67 64  Resp: 18 17 17 17   Temp: 97.7 F (36.5 C) 97.9 F (36.6 C) 98 F (36.7 C) 97.8 F (36.6 C)  TempSrc: Oral Oral  Oral  SpO2: 98% 96% 97% 98%  Weight:      Height:       Supplemental O2: Room Air SpO2: 98 %   Physical Exam:  Constitutional: well-appearing, in no acute distress Cardiovascular: RRR, no murmurs, rubs or gallops Pulmonary/Chest: normal work of breathing on room air, lungs clear to auscultation bilaterally Abdominal: soft, non-tender, non-distended Neuro:  Patient is alert and oriented , with good insight to his clinical status and overall progression of disease.  His speech is delayed, stable at baseline, movements are also delayed with the creased smoothness in fine motor movements, stable for patient.  He has symmetric face, mild dysarthria, stable. RUE 5/5 strength. LUE 4/5 strength, unable to  raise it above the shoulder, stable. RLE 5/5,  Skin: warm and dry Extremities: upper/lower extremity pulses 2+, no lower extremity edema present   Intake/Output Summary (Last 24 hours) at 08/22/2023 1148 Last data filed at 08/22/2023 1100 Gross per 24 hour  Intake 438 ml  Output 1050 ml  Net -612 ml   Net IO Since Admission: -2,427 mL [08/22/23 1148]  Pertinent Labs:    Latest Ref Rng & Units 08/22/2023    5:32 AM 08/19/2023    4:50 AM 08/18/2023   11:13 AM  CBC  WBC 4.0 - 10.5 K/uL 6.1  4.8    Hemoglobin 13.0 - 17.0 g/dL 13.0  86.5  78.4   Hematocrit 39.0 - 52.0 % 43.3  47.8  46.0   Platelets 150 - 400 K/uL 203  240         Latest Ref Rng & Units 08/22/2023    5:32 AM 08/20/2023    4:22 AM 08/19/2023    4:50 AM  CMP  Glucose 70 - 99 mg/dL 91  696  295   BUN 6 - 20 mg/dL 18  18  14    Creatinine 0.61 - 1.24 mg/dL 2.84  1.32  4.40   Sodium 135 - 145 mmol/L 136  132  132   Potassium 3.5 - 5.1 mmol/L 4.0  4.0  4.3   Chloride 98 - 111 mmol/L 104  101  102   CO2 22 - 32 mmol/L 24  19  22  Calcium 8.9 - 10.3 mg/dL 8.6  9.4  9.2   Total Protein 6.5 - 8.1 g/dL   6.6   Total Bilirubin <1.2 mg/dL   0.6   Alkaline Phos 38 - 126 U/L   75   AST 15 - 41 U/L   15   ALT 0 - 44 U/L   16     Imaging: No results found.   Assessment/Plan:   Principal Problem:   Cerebral infarction [facility]) Active Problems:   B12 deficiency  #Clinical stroke #Suspected right lacunar CVA #Hx of vertebrobasilar/thalamic infarct with R sided hemiparesis CT head without evidence of acute infarct. Decision to not pursue MRI brain due to bilateral cochlear implants and VP shunt. Neurology consulted, symptoms likely explained from R lacunar infarct or from vasculopathy 2/2 prior radiation. Of note, patient demonstrates improvement in left lower extremity strength today compared to prior, now able to move distal lower extremity against gravity. TTE with normal EF and normal valve function, EEG without  evidence of seizures -ASA and Plavix for 3 weeks then Plavix indefinitely -Atorvastatin 40mg  daily -SLP evaluated, on dysphagia 3 diet and thick nectar fluid -PT recommends SNF for long term rehabilitation. Awaiting availability -Starting Cymbalta 30mg  daily and gabapentin 300mg  TID for neuropathic pain and post-stroke depression  #Seizure disorder -Continue home Oxcarbazapine 300mg  BID  #B12 deficiency B12 levels 134 during this admission -Continue B12 daily  #Hypertension -Continue on metoprolol succinate 50mg  daily  Diet: Dysphagia 3 diet and thick nectar fluids IVF: None VTE: Enoxaparin Code: Full  Dispo: Anticipated discharge to Skilled nursing facility in 1-2 days pending SNF availability  Monna Fam, MD PGY-1 Internal Medicine Resident Pager Number [phone] Please contact the on call pager after 5 pm and on weekends at [phone].

## 2023-08-22 NOTE — Progress Notes (Signed)
Patient complaining of pain in BLE. Patient received one time dose of gabapentin last night for pain in legs. This was effective according to pt.   Pt requesting another gabapentin dose. MD paged.  Update: See MAR.

## 2023-08-22 NOTE — TOC Progression Note (Signed)
Transition of Care [facility]) - Progression Note    Patient Details  Name: [name] Love MRN: [id number] Date of Birth: [birth year]  Transition of [facility]) CM/SW Contact  Baldemar Lenis, [place] Phone Number: 08/22/2023, 1:27 PM  Clinical Narrative:   CSW met with patient to discuss SNF offers, but patient requested that CSW contact his POA, April. CSW spoke with April via phone with patient in the room and discussed SNF options. April interested in [facility] or [facility]. CSW sent referrals, asked [facility] to review. [facility] can offer a bed. Patient would like to accept. CSW asked CMA to initiate insurance authorization request. CSW to follow.    Expected Discharge Plan: Skilled Nursing Facility Barriers to Discharge: Continued Medical Work up, English as a second language teacher  Expected Discharge Plan and Services     Post Acute Care Choice: Skilled Nursing Facility Living arrangements for the past 2 months: Apartment                                       Social Determinants of Health (SDOH) Interventions SDOH Screenings   Food Insecurity: No Food Insecurity (08/22/2023)  Transportation Needs: No Transportation Needs (08/22/2023)  Utilities: Not At Risk (08/22/2023)  Financial Resource Strain: Low Risk  (06/07/2018)  Physical Activity: Unknown (06/07/2018)  Social Connections: Unknown (06/07/2018)  Stress: No Stress Concern Present (06/07/2018)  Tobacco Use: Low Risk  (08/22/2023)    Readmission Risk Interventions     No data to display

## 2023-08-22 NOTE — Care Management Important Message (Signed)
Important Message  Patient Details  Name: [name] Love MRN: [id number] Date of Birth: [birth year]   Important Message Given:  Yes - Medicare IM     Dorena Bodo 08/22/2023, 3:29 PM

## 2023-08-22 NOTE — Care Management Important Message (Signed)
Important Message  Patient Details  Name: [name] Love MRN: [id number] Date of Birth: 1971-04-19   Important Message Given:  Yes - Medicare IM     Dorena Bodo 08/22/2023, 3:28 PM

## 2023-08-22 NOTE — Plan of Care (Signed)
  Problem: Education: Goal: Knowledge of General Education information will improve Description: Including pain rating scale, medication(s)/side effects and non-pharmacologic comfort measures Outcome: Progressing   Problem: Health Behavior/Discharge Planning: Goal: Ability to manage health-related needs will improve Outcome: Progressing   Problem: Clinical Measurements: Goal: Ability to maintain clinical measurements within normal limits will improve Outcome: Progressing Goal: Will remain free from infection Outcome: Progressing Goal: Diagnostic test results will improve Outcome: Progressing Goal: Respiratory complications will improve Outcome: Progressing Goal: Cardiovascular complication will be avoided Outcome: Progressing   Problem: Activity: Goal: Risk for activity intolerance will decrease Outcome: Progressing   Problem: Nutrition: Goal: Adequate nutrition will be maintained Outcome: Progressing   Problem: Coping: Goal: Level of anxiety will decrease Outcome: Progressing   Problem: Elimination: Goal: Will not experience complications related to bowel motility Outcome: Progressing Goal: Will not experience complications related to urinary retention Outcome: Progressing   Problem: Pain Management: Goal: General experience of comfort will improve Outcome: Progressing   Problem: Safety: Goal: Ability to remain free from injury will improve Outcome: Progressing   Problem: Skin Integrity: Goal: Risk for impaired skin integrity will decrease Outcome: Progressing   Problem: Education: Goal: Knowledge of disease or condition will improve Outcome: Progressing Goal: Knowledge of secondary prevention will improve (MUST DOCUMENT ALL) Outcome: Progressing Goal: Knowledge of patient specific risk factors will improve Loraine Leriche N/A or DELETE if not current risk factor) Outcome: Progressing   Problem: Ischemic Stroke/TIA Tissue Perfusion: Goal: Complications of ischemic  stroke/TIA will be minimized Outcome: Progressing   Problem: Coping: Goal: Will verbalize positive feelings about self Outcome: Progressing   Problem: Self-Care: Goal: Ability to participate in self-care as condition permits will improve Outcome: Progressing Goal: Verbalization of feelings and concerns over difficulty with self-care will improve Outcome: Progressing Goal: Ability to communicate needs accurately will improve Outcome: Progressing   Problem: Nutrition: Goal: Risk of aspiration will decrease Outcome: Progressing Goal: Dietary intake will improve Outcome: Progressing

## 2023-08-22 NOTE — Progress Notes (Signed)
Speech Language Pathology Treatment: Dysphagia  Patient Details Name: [name] Love MRN: [id number] DOB: 1970-11-28 Today's Date: 08/22/2023 Time: 2725-3664 SLP Time Calculation (min) (ACUTE ONLY): 19 min  Assessment / Plan / Recommendation Clinical Impression  Pt reports no difficulty with current diet. He does endorse frequent eructation and a globus sensation with solids, which may be representative of an esophageal component. Observed pt with trials of nectar thick liquids via straw, which consistently resulted in immediate coughing. This did not recur when straw was removed and SLP provided Min verbal cues to take controlled cup sips. No s/s of dysphagia noted with Dys 3 solids. Provided education regarding results of MBS and therapy goals. Current diet recommendation remains appropriate. SLP will continue following.    HPI HPI: [name] Love is a 52 yo male presenting to ED 11/14 with L sided weakness and R gaze preference. CTH negative. MRI Brain pending. PMH includes CVA 2019, anxiety, arthritis, bilateral arm weakness, bilateral hearing loss with cochlear implants, hypothyroidism, memory loss, seizures, insomnia      SLP Plan  Continue with current plan of care      Recommendations for follow up therapy are one component of a multi-disciplinary discharge planning process, led by the attending physician.  Recommendations may be updated based on patient status, additional functional criteria and insurance authorization.    Recommendations  Diet recommendations: Dysphagia 3 (mechanical soft);Nectar-thick liquid Liquids provided via: Cup;No straw Medication Administration: Whole meds with puree Supervision: Patient able to self feed;Intermittent supervision to cue for compensatory strategies Compensations: Minimize environmental distractions;Slow rate;Small sips/bites Postural Changes and/or Swallow Maneuvers: Seated upright 90 degrees                  Oral care QID;Staff/trained  caregiver to provide oral care   Frequent or constant Supervision/Assistance Dysphagia, oropharyngeal phase (R13.12)     Continue with current plan of care     Gwynneth Aliment, M.A., CF-SLP Speech Language Pathology, Acute Rehabilitation Services  Secure Chat preferred [phone]   08/22/2023, 10:59 AM

## 2023-08-22 NOTE — Progress Notes (Signed)
Physical Therapy Treatment Patient Details Name: [name] Love MRN: [id number] DOB: 03-Oct-1971 Today's Date: 08/22/2023   History of Present Illness Pt is 52 year old presented to [facility] on  08/18/23 for new lt sided weakness. EEG normal. CT negative for acute changes. MRI pending. 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 Pt requiring min A to come to sitting EOB with cues for sequencing and assist to gain balance as pt with R lateral lean. Pt progressing transfers this session, able to rise to stand with RW for support x3 during session with mod A to steady and complete standing pre-gait activities with mod A and tactile cues for midline standing and upright trunk.  Pt able to step pivot to chair with mod A to maintain balance and RW support. Current plan remains appropriate to address deficits and maximize functional independence and decrease caregiver burden. Pt continues to benefit from skilled PT services to progress toward functional mobility goals.     If plan is discharge home, recommend the following: A lot of help with walking and/or transfers;A lot of help with bathing/dressing/bathroom;Assist for transportation;Help with stairs or ramp for entrance   Can travel by private vehicle        Equipment Recommendations  Other (comment) (To be determined)    Recommendations for Other Services       Precautions / Restrictions Precautions Precautions: Fall Restrictions Weight Bearing Restrictions: No     Mobility  Bed Mobility Overal bed mobility: Needs Assistance Bed Mobility: Supine to Sit     Supine to sit: Min assist, HOB elevated, Used rails     General bed mobility comments: light min A to elevate trunk and gain sitting balance, pt with R lateral  lean needing verbal and tactile cues to come to midline    Transfers Overall transfer level: Needs assistance Equipment used: Rolling walker (2 wheels) Transfers: Sit to/from Stand, Bed to chair/wheelchair/BSC Sit to Stand: Mod assist   Step pivot transfers: Mod assist       General transfer comment: standing from EOB x3, mod A to rise and facilitate anterior weight shift, mod A to maintain standing balance, low clearance when stepping to chair    Ambulation/Gait             Pre-gait activities: weight shifting and static marching x20 with mod A to shift weight and maintain upright trunk, able to side step along EOB to [facility] Gait Details: Unable   Stairs             Wheelchair Mobility     Tilt Bed    Modified Rankin (Stroke Patients Only)       Balance Overall balance assessment: Needs assistance Sitting-balance support: Bilateral upper extremity supported, Feet supported Sitting balance-Leahy Scale: Poor Sitting balance - Comments: Sat EOB with UE support and occasional loss of balance require assist to correct Postural control: Right lateral lean, Posterior lean Standing balance support: Bilateral upper extremity supported Standing balance-Leahy Scale: Poor Standing balance comment: mod A to maintain standing balance                            Cognition Arousal: Alert Behavior During Therapy: WFL for tasks assessed/performed Overall Cognitive Status: No family/caregiver present to determine baseline  cognitive functioning                                 General Comments: Decreased awareness of safety        Exercises      General Comments        Pertinent Vitals/Pain Pain Assessment Pain Assessment: Faces Faces Pain Scale: Hurts a little bit Pain Location: L leg Pain Descriptors / Indicators: Burning, Grimacing, Guarding, Sharp Pain Intervention(s): Monitored during session, Limited activity within  patient's tolerance    Home Living                          Prior Function            PT Goals (current goals can now be found in the care plan section) Acute Rehab PT Goals Patient Stated Goal: return home to service dog PT Goal Formulation: With patient Time For Goal Achievement: 09/02/23 Progress towards PT goals: Progressing toward goals    Frequency    Min 1X/week      PT Plan      Co-evaluation              AM-PAC PT "6 Clicks" Mobility   Outcome Measure  Help needed turning from your back to your side while in a flat bed without using bedrails?: A Little Help needed moving from lying on your back to sitting on the side of a flat bed without using bedrails?: A Little Help needed moving to and from a bed to a chair (including a wheelchair)?: A Lot Help needed standing up from a chair using your arms (e.g., wheelchair or bedside chair)?: A Lot Help needed to walk in hospital room?: Total Help needed climbing 3-5 steps with a railing? : Total 6 Click Score: 12    End of Session Equipment Utilized During Treatment: Gait belt Activity Tolerance: Patient tolerated treatment well Patient left: in chair;with call bell/phone within reach;with chair alarm set Nurse Communication: Mobility status;Need for lift equipment (stedy +2 back to bed) PT Visit Diagnosis: Other abnormalities of gait and mobility (R26.89);Muscle weakness (generalized) (M62.81);Difficulty in walking, not elsewhere classified (R26.2);Other symptoms and signs involving the nervous system (R29.898)     Time: 1610-9604 PT Time Calculation (min) (ACUTE ONLY): 24 min  Charges:    $Therapeutic Activity: 23-37 mins PT General Charges $$ ACUTE PT VISIT: 1 Visit                     Lindsi Bayliss R. PTA Acute Rehabilitation Services Office: [phone]   Catalina Antigua 08/22/2023, 1:21 PM

## 2023-08-23 DIAGNOSIS — R29898 Other symptoms and signs involving the musculoskeletal system: Secondary | ICD-10-CM | POA: Diagnosis not present

## 2023-08-23 DIAGNOSIS — M6289 Other specified disorders of muscle: Secondary | ICD-10-CM | POA: Diagnosis not present

## 2023-08-23 DIAGNOSIS — I10 Essential (primary) hypertension: Secondary | ICD-10-CM | POA: Diagnosis not present

## 2023-08-23 DIAGNOSIS — R1310 Dysphagia, unspecified: Secondary | ICD-10-CM | POA: Diagnosis not present

## 2023-08-23 DIAGNOSIS — G459 Transient cerebral ischemic attack, unspecified: Secondary | ICD-10-CM | POA: Diagnosis not present

## 2023-08-23 DIAGNOSIS — R2681 Unsteadiness on feet: Secondary | ICD-10-CM | POA: Diagnosis not present

## 2023-08-23 DIAGNOSIS — F411 Generalized anxiety disorder: Secondary | ICD-10-CM | POA: Diagnosis not present

## 2023-08-23 DIAGNOSIS — Z9621 Cochlear implant status: Secondary | ICD-10-CM | POA: Diagnosis not present

## 2023-08-23 DIAGNOSIS — I6330 Cerebral infarction due to thrombosis of unspecified cerebral artery: Secondary | ICD-10-CM | POA: Diagnosis not present

## 2023-08-23 DIAGNOSIS — R5381 Other malaise: Secondary | ICD-10-CM | POA: Diagnosis not present

## 2023-08-23 DIAGNOSIS — I639 Cerebral infarction, unspecified: Secondary | ICD-10-CM | POA: Diagnosis not present

## 2023-08-23 DIAGNOSIS — Z741 Need for assistance with personal care: Secondary | ICD-10-CM | POA: Diagnosis not present

## 2023-08-23 DIAGNOSIS — I69351 Hemiplegia and hemiparesis following cerebral infarction affecting right dominant side: Secondary | ICD-10-CM | POA: Diagnosis not present

## 2023-08-23 DIAGNOSIS — R41841 Cognitive communication deficit: Secondary | ICD-10-CM | POA: Diagnosis not present

## 2023-08-23 DIAGNOSIS — I6381 Other cerebral infarction due to occlusion or stenosis of small artery: Secondary | ICD-10-CM | POA: Diagnosis not present

## 2023-08-23 DIAGNOSIS — Z7401 Bed confinement status: Secondary | ICD-10-CM | POA: Diagnosis not present

## 2023-08-23 DIAGNOSIS — M6259 Muscle wasting and atrophy, not elsewhere classified, multiple sites: Secondary | ICD-10-CM | POA: Diagnosis not present

## 2023-08-23 DIAGNOSIS — R1312 Dysphagia, oropharyngeal phase: Secondary | ICD-10-CM | POA: Diagnosis not present

## 2023-08-23 DIAGNOSIS — Z7189 Other specified counseling: Secondary | ICD-10-CM | POA: Diagnosis not present

## 2023-08-23 DIAGNOSIS — M6281 Muscle weakness (generalized): Secondary | ICD-10-CM | POA: Diagnosis not present

## 2023-08-23 DIAGNOSIS — R2689 Other abnormalities of gait and mobility: Secondary | ICD-10-CM | POA: Diagnosis not present

## 2023-08-23 DIAGNOSIS — F445 Conversion disorder with seizures or convulsions: Secondary | ICD-10-CM | POA: Diagnosis not present

## 2023-08-23 LAB — RENAL FUNCTION PANEL
Albumin: 3.3 g/dL — ABNORMAL LOW (ref 3.5–5.0)
Anion gap: 8 (ref 5–15)
BUN: 16 mg/dL (ref 6–20)
CO2: 26 mmol/L (ref 22–32)
Calcium: 8.5 mg/dL — ABNORMAL LOW (ref 8.9–10.3)
Chloride: 99 mmol/L (ref 98–111)
Creatinine, Ser: 1.08 mg/dL (ref 0.61–1.24)
GFR, Estimated: >60 mL/min (ref >60)
Glucose, Bld: 90 mg/dL (ref 70–99)
Phosphorus: 4 mg/dL (ref 2.5–4.6)
Potassium: 3.8 mmol/L (ref 3.5–5.1)
Sodium: 133 mmol/L — ABNORMAL LOW (ref 135–145)

## 2023-08-23 MED ORDER — POLYETHYLENE GLYCOL 3350 17 G PO PACK: 17.0000 g | PACK | Freq: Every day | ORAL | Status: DC | PRN

## 2023-08-23 MED ORDER — ASPIRIN 81 MG PO TBEC: 81.0000 mg | DELAYED_RELEASE_TABLET | Freq: Every day | ORAL | Status: AC

## 2023-08-23 MED ORDER — ATORVASTATIN CALCIUM 40 MG PO TABS: 40.0000 mg | ORAL_TABLET | Freq: Every day | ORAL | Status: AC

## 2023-08-23 MED ORDER — ACETAMINOPHEN 500 MG PO TABS: 1000.0000 mg | ORAL_TABLET | Freq: Three times a day (TID) | ORAL | Status: AC

## 2023-08-23 MED ORDER — CYANOCOBALAMIN 1000 MCG PO TABS: 1000.0000 ug | ORAL_TABLET | Freq: Every day | ORAL | Status: DC

## 2023-08-23 MED ORDER — LIDOCAINE 5 % EX PTCH: 2.0000 | MEDICATED_PATCH | Freq: Every day | CUTANEOUS | Status: DC | PRN

## 2023-08-23 MED ORDER — ALPRAZOLAM 0.5 MG PO TABS: 0.5000 mg | ORAL_TABLET | Freq: Two times a day (BID) | ORAL | 2 refills | Status: AC

## 2023-08-23 MED ORDER — DULOXETINE HCL 30 MG PO CPEP: 30.0000 mg | ORAL_CAPSULE | Freq: Every day | ORAL | Status: DC

## 2023-08-23 MED ORDER — GABAPENTIN 300 MG PO CAPS: 300.0000 mg | ORAL_CAPSULE | Freq: Three times a day (TID) | ORAL | Status: DC

## 2023-08-23 NOTE — Progress Notes (Signed)
Physical Therapy Treatment Patient Details Name: [name] Love MRN: [id number] DOB: [birth year] Today's Date: 08/23/2023   History of Present Illness Pt is 52 year old presented to [facility] on  08/18/23 for new lt sided weakness. EEG normal. CT negative for acute changes. MRI pending. 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 Pt able to demonstrate x2 short gait bouts in room with up to mod A to steady and maintain balance with RW for support. Pt with ataxic step quality with heavy footfall on R and noted L knee hyperextension worsening with fatigue, cues throughout for upright posture and closer RW proximity to allow for increased UE support. Pt requiring min A for bed mobility and grossly min A for transfers to stand with pt able to correct posterior bias in standing with task practice. Pt agreeable to time up in chair at end of session and demonstrating understanding of pressure relief techniques as pt reporting some discomfort from being up in chair on prior days. Current plan remains appropriate to address deficits and maximize functional independence and decrease caregiver burden. Pt continues to benefit from skilled PT services to progress toward functional mobility goals.     If plan is discharge home, recommend the following: A lot of help with walking and/or transfers;A lot of help with bathing/dressing/bathroom;Assist for transportation;Help with stairs or ramp for entrance   Can travel by private vehicle        Equipment Recommendations  Other (comment) (To be determined)    Recommendations for Other Services       Precautions / Restrictions Precautions Precautions: Fall Restrictions Weight Bearing Restrictions: No     Mobility  Bed Mobility Overal bed  mobility: Needs Assistance Bed Mobility: Supine to Sit     Supine to sit: Min assist, HOB elevated, Used rails     General bed mobility comments: light min A to elevate trunk and gain sitting balance, pt with R lateral lean needing verbal and tactile cues to come to midline    Transfers Overall transfer level: Needs assistance Equipment used: Rolling walker (2 wheels) Transfers: Sit to/from Stand, Bed to chair/wheelchair/BSC Sit to Stand: Mod assist, Min assist   Step pivot transfers: Min assist       General transfer comment: standing from EOB x3, mod A to rise and facilitate anterior weight shift and boost to rise, down to min A at end of session, mod A to maintain standing balance initially as pt with posterior lean, able to correct with cues, min A to manage RW and steady during step pivot at end of session    Ambulation/Gait Ambulation/Gait assistance: Min assist, Mod assist Gait Distance (Feet): 10 Feet (+ 4') Assistive device: Rolling walker (2 wheels) Gait Pattern/deviations: Step-to pattern, Decreased stride length, Decreased dorsiflexion - right, Decreased dorsiflexion - left, Knee hyperextension - left Gait velocity: decr   Pre-gait activities: standing marching x10 General Gait Details: ataxic with decreased DF bilaterally, pt with heavy foot fall on R with each step, L knee hyperextension on L worsening with fatigue, chair follow for safety, noted increase in ataxia with fatigue during second bout   Stairs             Wheelchair Mobility     Tilt Bed    Modified Rankin (Stroke Patients Only)  Balance Overall balance assessment: Needs assistance Sitting-balance support: Bilateral upper extremity supported, Feet supported Sitting balance-Leahy Scale: Poor Sitting balance - Comments: Sat EOB with UE support and occasional posterior R lateral lean, pt able to self correct Postural control: Right lateral lean, Posterior lean Standing balance  support: Bilateral upper extremity supported Standing balance-Leahy Scale: Poor Standing balance comment: reliant on UE and external support                            Cognition Arousal: Alert Behavior During Therapy: WFL for tasks assessed/performed Overall Cognitive Status: No family/caregiver present to determine baseline cognitive functioning                                 General Comments: Decreased awareness of safety        Exercises Other Exercises Other Exercises: cross body punches with RUE to facilitate midline as pt with R lateral lean x15, LUE lateral punces to L x10    General Comments        Pertinent Vitals/Pain Pain Assessment Pain Assessment: Faces Faces Pain Scale: Hurts a little bit Pain Location: L leg Pain Descriptors / Indicators: Grimacing, Guarding Pain Intervention(s): Limited activity within patient's tolerance, Monitored during session, Repositioned    Home Living                          Prior Function            PT Goals (current goals can now be found in the care plan section) Acute Rehab PT Goals Patient Stated Goal: return home to service dog PT Goal Formulation: With patient Time For Goal Achievement: 09/02/23 Progress towards PT goals: Progressing toward goals    Frequency    Min 1X/week      PT Plan      Co-evaluation              AM-PAC PT "6 Clicks" Mobility   Outcome Measure  Help needed turning from your back to your side while in a flat bed without using bedrails?: A Little Help needed moving from lying on your back to sitting on the side of a flat bed without using bedrails?: A Little Help needed moving to and from a bed to a chair (including a wheelchair)?: A Lot Help needed standing up from a chair using your arms (e.g., wheelchair or bedside chair)?: A Lot Help needed to walk in hospital room?: A Lot Help needed climbing 3-5 steps with a railing? : Total 6 Click  Score: 13    End of Session Equipment Utilized During Treatment: Gait belt Activity Tolerance: Patient tolerated treatment well Patient left: in chair;with call bell/phone within reach;with chair alarm set Nurse Communication: Mobility status;Need for lift equipment (stedy +2 back to bed) PT Visit Diagnosis: Other abnormalities of gait and mobility (R26.89);Muscle weakness (generalized) (M62.81);Difficulty in walking, not elsewhere classified (R26.2);Other symptoms and signs involving the nervous system (R29.898)     Time: 1610-9604 PT Time Calculation (min) (ACUTE ONLY): 24 min  Charges:    $Gait Training: 8-22 mins $Therapeutic Activity: 8-22 mins PT General Charges $$ ACUTE PT VISIT: 1 Visit                     Avi Kerschner R. PTA Acute Rehabilitation Services Office: [phone]   Catalina Antigua 08/23/2023, 10:52 AM

## 2023-08-23 NOTE — Discharge Instructions (Addendum)
Mr [name] Love, [name] Love were hospitalized for a stroke. Thank you for allowing [place] to be part of your care.   Please arrange a follow up appointment with your PCP Dr Nathanial Rancher within the next two weeks.   Please note these changes made to your medications:   *Please START taking:  Aspirin 81mg  daily Atorvastatin 40mg  daily Vitamin B12 daily Duloxetine 30mg  daily Gabapentin 300mg  three times daily  Please continue taking Oxcarbazepine 300mg  twice daily Plavix 75mg  daily Metoprolol succinate 50mg  daily  Please make sure to go to the emergency department if you have new or worsening symptoms such as difficulty moving, talking, or speaking.   Thanks,  Dr Carlynn Purl

## 2023-08-23 NOTE — Discharge Summary (Addendum)
Name: [name] Love MRN: [id number] DOB: 1971-08-19 52 y.o. PCP: Ailene Ravel, MD  Date of Admission: 08/18/2023 11:09 AM Date of Discharge: 08/23/23  Attending Physician: Dr. Mayford Knife  Discharge Diagnosis: Principal Problem:   Cerebral infarction [facility]) Active Problems:   Seizures (HCC)   Cochlear implant in place with multiple channels   Hx of non-sustained ventricular tachycardia (HCC)   B12 deficiency    Discharge Medications: Allergies as of 08/23/2023       Reactions   Baclofen    Urinary retension   Seroquel [quetiapine] Other (See Comments)   Weight gain   Sulfa Antibiotics Other (See Comments)   Reaction ??   Coconut Flavor Rash, Other (See Comments)   ANYTHING COCONUT- allergic to this   Contrast Media [iodinated Contrast Media] Rash        Medication List     TAKE these medications    acetaminophen 500 MG tablet Commonly known as: TYLENOL Take 2 tablets (1,000 mg total) by mouth every 8 (eight) hours.   ALPRAZolam 0.5 MG tablet Commonly known as: XANAX Take 0.5 mg by mouth 2 (two) times daily.   aspirin EC 81 MG tablet Take 1 tablet (81 mg total) by mouth daily for 15 days. Swallow whole. Start taking on: August 24, 2023   atorvastatin 40 MG tablet Commonly known as: LIPITOR Take 1 tablet (40 mg total) by mouth daily. Start taking on: August 24, 2023   clopidogrel 75 MG tablet Commonly known as: PLAVIX TAKE 1 TABLET(75 MG) BY MOUTH DAILY   cyanocobalamin 1000 MCG tablet Take 1 tablet (1,000 mcg total) by mouth daily. Start taking on: August 24, 2023   DULoxetine 30 MG capsule Commonly known as: CYMBALTA Take 1 capsule (30 mg total) by mouth daily. Start taking on: August 24, 2023   fluticasone 50 MCG/ACT nasal spray Commonly known as: FLONASE Place 2 sprays into both nostrils 2 (two) times daily as needed for allergies or rhinitis.   gabapentin 300 MG capsule Commonly known as: NEURONTIN Take 1 capsule (300 mg total) by  mouth 3 (three) times daily.   lidocaine 5 % Commonly known as: LIDODERM Place 2 patches onto the skin daily as needed. Remove & Discard patch within 12 hours or as directed by MD   metoprolol succinate 50 MG 24 hr tablet Commonly known as: TOPROL-XL Take 50 mg by mouth daily. Take with or immediately following a meal.   Oxcarbazepine 300 MG tablet Commonly known as: TRILEPTAL TAKE 1 TABLET(300 MG) BY MOUTH TWICE DAILY What changed: See the new instructions.   polyethylene glycol 17 g packet Commonly known as: MIRALAX / GLYCOLAX Take 17 g by mouth daily as needed for moderate constipation.        Disposition and follow-up:   Mr.[name] Love was discharged from [facility] in Stable condition.  At the hospital follow up visit please address:  1.  Follow-up:  a. Stroke: neuro exam for any improvement of strength in lower extremities    b. Anxiety: check on symptoms since starting duloxetine   c. Neuropathic pain: currently on gabapentin and duloxetine  2.  Labs / imaging needed at time of follow-up: None  3.  Pending labs/ test needing follow-up: None  4.  Medication Changes  *Please START taking:  Aspirin 81mg  daily Atorvastatin 40mg  daily Vitamin B12 daily Duloxetine 30mg  daily Gabapentin 300mg  three times daily  Please continue taking Oxcarbazepine 300mg  twice daily Plavix 75mg  daily Metoprolol succinate 50mg  daily  Follow-up Appointments:  Follow-up Information     Glean Salvo, NP. Go on 11/10/2023.   Specialty: Neurology Contact information: [address] [place] [address] [phone]                 Hospital Course by problem list:  [name] Love is a 52 y.o. 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 Decision to not pursue MRI brain as there is potential for poor study due to bilateral cochlear implants and reposition of VP shunt post MRI. Neurology consulted; symptoms likely explained from R lacunar infarct or from vasculopathy 2/2 prior radiation. CT lumbar/sacral spine without acute abnormalities to explain symptoms. TTE with normal EF and normal valve function. EEF without evidence of seizures. Telemetry without acute findings. Considered repeating CT head as it may now show ischemic infarct not visible in the acute setting; however, likely not to change current management of disease. He will continue on DAPT for three weeks, and plavix indefinitely thereafter, statin therapy. PT evaluated; given 3/5LLE weakness, patient will require subacute rehabilitation at SNF with likely transition to assisted living. SLP consulted during this admission; now s/p modified barium swallow with recommendations to maintain patient of dysphagia 3 diet and thick nectar fluids.   Seizure disorder No evidence of seizures on this admission. Continued on PTA oxcarbazepine. Avoid medications which can lower seizure threshold.  B12 deficiency B12 levels 134 during this admission.  Unlikely to have explained acute symptoms on admission, but could certainly interfere with optimal neurologic function. Will need discharge on 1000 mcg B12 daily, and f/u level to ensure he is absorbing appropriately.    Hypertension Hx of ventricular ectopy Continued on metoprolol succinate 50 mg daily  Anxiety Continued on Xanax BID. Also initiated Cymbalta for treatment of neuropathic type pain as well as post-stroke depression.  This can be titrated as appropriate.  Neuropathic pain Patient  reporting aching neuropathic type pain in both legs, worse on right side. Started gabapentin 300mg  TID and duloxetine 30mg .  Titrate as appropriate.    Discharge Subjective: Saw patient at bedside this am. He was feeling optimistic about his therapy and improvements in his mobility. Endorses continued leg pain but gabapentin and duloxetine have been moderately helpful. Told patient he could potentially be discharged today, which he understands and looks forward to for the opportunity to have more daily therapy.   Discharge Exam:   BP 114/75 (BP Location: Right Arm)   Pulse 82   Temp 98 F (36.7 C) (Oral)   Resp 17   Ht 5\' 8"  (1.727 m)   Wt 84.8 kg   SpO2 96%   BMI 28.43 kg/m  Constitutional: well-appearing, sitting in chair, in no acute distress.  Unable to reposition himself independently. Speech is delayed and dysarthric, though understandable. HENT: normocephalic atraumatic, mucous membranes moist.  He has cochlear implants and prefers to use it on the R ear where hearing is best. Eyes: conjunctiva non-erythematous Neck: supple Cardiovascular: regular rate and rhythm, no m/r/g Pulmonary/Chest: normal work of breathing on room air, lungs clear to auscultation bilaterally Abdominal: soft, non-tender, non-distended MSK:  normal bulk and tone Neurological:  Patient is alert and oriented , with good insight to his clinical status and overall progression of disease.  His speech is delayed, stable at baseline, movements are also delayed with the decreased smoothness in fine motor movements, stable for patient.  He has symmetric face, mild dysarthria, stable. RUE 5/5 strength. LUE 4/5 strength, unable to raise it above the shoulder, stable. RLE 5/5 Skin: warm and dry Psych: appropriate mood and affect  Pertinent Labs, Studies, and Procedures:     Latest Ref Rng & Units 08/22/2023    5:32 AM 08/19/2023    4:50 AM 08/18/2023   11:13 AM  CBC  WBC 4.0 - 10.5 K/uL 6.1  4.8    Hemoglobin  13.0 - 17.0 g/dL 14.7  82.9  56.2   Hematocrit 39.0 - 52.0 % 43.3  47.8  46.0   Platelets 150 - 400 K/uL 203  240         Latest Ref Rng & Units 08/23/2023    6:01 AM 08/22/2023    5:32 AM 08/20/2023    4:22 AM  CMP  Glucose 70 - 99 mg/dL 90  91  130   BUN 6 - 20 mg/dL 16  18  18    Creatinine 0.61 - 1.24 mg/dL 8.65  7.84  6.96   Sodium 135 - 145 mmol/L 133  136  132   Potassium 3.5 - 5.1 mmol/L 3.8  4.0  4.0   Chloride 98 - 111 mmol/L 99  104  101   CO2 22 - 32 mmol/L 26  24  19    Calcium 8.9 - 10.3 mg/dL 8.5  8.6  9.4     DG Swallowing Func-Speech Pathology  Result Date: 08/19/2023 Table formatting from the original result was not included. Modified Barium Swallow Study Patient Details Name: Aydyn Lizza MRN: [id number] Date of Birth: 08-10-1971 Today's Date: 08/19/2023 HPI/PMH: HPI: Mackinley Bullen is a 52 yo male presenting to ED 11/14 with L sided weakness and R gaze preference. CTH negative. MRI Brain pending. PMH includes CVA 2019, anxiety, arthritis, bilateral arm weakness, bilateral hearing loss with cochlear implants, hypothyroidism, memory loss, seizures, insomnia Clinical Impression: Clinical Impression: Pt presents with a mild oropharyngeal dysphagia characterized by seemingly reduced strength and coordination. Given consecutive sips of thin liquids, pt has reduced control and ability to protect his airway. Pt achieves adequate laryngeal vestibule closure during the initial swallow but subsequent swallows become increasingly disorganized with pt unable to coordinate or maintain laryngeal elevation. This results in silent aspiration of thin liquids (PAS 8). A chin tuck posture was effective in preventing airway invasion with thin liquids, although question pt's current ability to consistently use this strategy. Nectar thick liquids were transiently penetrated in trace quantities, which is considered a normal variant. Etiology of these acute swallowing deficits remains unknown. Recommend  continuing Dys 3 texture solids and downgrading to nectar thick liquids. SLP will continue following to target dysphagia goals as able. Factors that may increase risk of adverse event in presence of aspiration Rubye Oaks & Clearance Coots 2021): Factors that may increase risk of adverse event in presence of aspiration Rubye Oaks & Clearance Coots 2021): Poor general health and/or compromised immunity; Limited mobility Recommendations/Plan: Swallowing Evaluation Recommendations Swallowing Evaluation Recommendations Recommendations: PO diet PO Diet Recommendation: Dysphagia 3 (Mechanical soft); Mildly thick liquids (Level 2, nectar thick) Liquid Administration via: Cup; Straw Medication Administration: Whole meds with puree Supervision: Patient able to self-feed; Full supervision/cueing for swallowing strategies Swallowing strategies  : Minimize  environmental distractions; Slow rate; Small bites/sips Postural changes: Position pt fully upright for meals Oral care recommendations: Oral care BID (2x/day) Treatment Plan Treatment Plan Treatment recommendations: Therapy as outlined in treatment plan below Follow-up recommendations: Acute inpatient rehab (3 hours/day) Functional status assessment: Patient has had a recent decline in their functional status and demonstrates the ability to make significant improvements in function in a reasonable and predictable amount of time. Treatment frequency: Min 2x/week Treatment duration: 2 weeks Interventions: Aspiration precaution training; Compensatory techniques; Patient/family education; Trials of upgraded texture/liquids; Diet toleration management by SLP Recommendations Recommendations for follow up therapy are one component of a multi-disciplinary discharge planning process, led by the attending physician.  Recommendations may be updated based on patient status, additional functional criteria and insurance authorization. Assessment: Orofacial Exam: Orofacial Exam Oral Cavity: Oral Hygiene: WFL  Oral Cavity - Dentition: Adequate natural dentition Orofacial Anatomy: WFL Oral Motor/Sensory Function: WFL Anatomy: Anatomy: WFL Boluses Administered: Boluses Administered Boluses Administered: Thin liquids (Level 0); Mildly thick liquids (Level 2, nectar thick); Moderately thick liquids (Level 3, honey thick); Puree; Solid  Oral Impairment Domain: Oral Impairment Domain Lip Closure: Interlabial escape, no progression to anterior lip Tongue control during bolus hold: Cohesive bolus between tongue to palatal seal Bolus preparation/mastication: Timely and efficient chewing and mashing Bolus transport/lingual motion: Brisk tongue motion Oral residue: Trace residue lining oral structures Location of oral residue : Tongue Initiation of pharyngeal swallow : Posterior laryngeal surface of the epiglottis  Pharyngeal Impairment Domain: Pharyngeal Impairment Domain Soft palate elevation: No bolus between soft palate (SP)/pharyngeal wall (PW) Laryngeal elevation: Complete superior movement of thyroid cartilage with complete approximation of arytenoids to epiglottic petiole Anterior hyoid excursion: Complete anterior movement Epiglottic movement: Complete inversion Laryngeal vestibule closure: Complete, no air/contrast in laryngeal vestibule Pharyngeal stripping wave : Present - complete Pharyngeal contraction (A/P view only): N/A Pharyngoesophageal segment opening: Partial distention/partial duration, partial obstruction of flow Tongue base retraction: Trace column of contrast or air between tongue base and PPW Pharyngeal residue: Trace residue within or on pharyngeal structures Location of pharyngeal residue: Tongue base; Valleculae; Pharyngeal wall  Esophageal Impairment Domain: Esophageal Impairment Domain Esophageal clearance upright position: Complete clearance, esophageal coating Pill: No data recorded Penetration/Aspiration Scale Score: Penetration/Aspiration Scale Score 1.  Material does not enter airway: Moderately  thick liquids (Level 3, honey thick); Puree; Solid 2.  Material enters airway, remains ABOVE vocal cords then ejected out: Mildly thick liquids (Level 2, nectar thick) 8.  Material enters airway, passes BELOW cords without attempt by patient to eject out (silent aspiration) : Thin liquids (Level 0) Compensatory Strategies: Compensatory Strategies Compensatory strategies: Yes Straw: Effective; Ineffective Effective Straw: Mildly thick liquid (Level 2, nectar thick) Ineffective Straw: Thin liquid (Level 0) Chin tuck: Effective Effective Chin Tuck: Thin liquid (Level 0) Left head turn: Ineffective Ineffective Left Head Turn: Thin liquid (Level 0)   General Information: Caregiver present: No  Diet Prior to this Study: Dysphagia 3 (mechanical soft); Thin liquids (Level 0)   Temperature : Normal   Respiratory Status: WFL   Supplemental O2: None (Room air)   History of Recent Intubation: No  Behavior/Cognition: Alert; Cooperative; Pleasant mood Self-Feeding Abilities: Able to self-feed Baseline vocal quality/speech: Normal Volitional Cough: Able to elicit Volitional Swallow: Able to elicit Exam Limitations: No limitations Goal Planning: Prognosis for improved oropharyngeal function: Good Barriers to Reach Goals: Time post onset No data recorded Patient/Family Stated Goal: none stated Consulted and agree with results and recommendations: Patient; Nurse Pain: Pain Assessment Pain  Assessment: No/denies pain End of Session: Start Time:SLP Start Time (ACUTE ONLY): 1458 Stop Time: SLP Stop Time (ACUTE ONLY): 1518 Time Calculation:SLP Time Calculation (min) (ACUTE ONLY): 20 min Charges: SLP Evaluations $ SLP Speech Visit: 1 Visit SLP Evaluations $BSS Swallow: 1 Procedure $MBS Swallow: 1 Procedure $Swallowing Treatment: 1 Procedure SLP visit diagnosis: SLP Visit Diagnosis: Dysphagia, oropharyngeal phase (R13.12) Past Medical History: Past Medical History: Diagnosis Date  Abnormal gait   Acute arterial ischemic stroke,  vertebrobasilar, thalamic (HCC) 06/08/2018  Acute CVA (cerebrovascular accident) (HCC) 06/07/2018  Allergic rhinitis   Anxiety   Arthritis 11/05/2013  Atypical chest pain 11/30/2017  Bilateral arm weakness   Bilateral hearing loss 07/28/2020  Brain cancer (HCC)   Cerebral thrombosis with cerebral infarction 04/17/2019  Cochlear implant in place with multiple channels 05/15/2020  CVA (cerebrovascular accident) (HCC) 04/18/2019  Depression   Diplopia   Dyspnea on exertion 11/30/2017  Ependymoma of brain (HCC)   Episode of change in speech 04/17/2019  Generalized anxiety disorder 11/28/2018  Headache 11/05/2013  Hearing loss, sensorineural   Hemiparesis and alteration of sensations as late effects of stroke (HCC) 09/04/2018  History of stroke 04/01/2020  Hypothyroidism   Insomnia   Left leg weakness   Low vitamin B12 level   Memory loss 11/05/2013  Myalgia 04/18/2017  OSA on CPAP 11/28/2018  Recurrent falls   Refusal of blood transfusions as patient is Jehovah's Witness 04/01/2020  Seizure (HCC)   Seizures (HCC)   Sleeping difficulty 11/05/2013  Stroke-like symptoms   Ventricular ectopy 06/04/2020  Weakness 04/16/2019  Weakness generalized 04/17/2019 Past Surgical History: Past Surgical History: Procedure Laterality Date  BRAIN SURGERY    HERNIA REPAIR    SHUNT REVISION   Gwynneth Aliment, M.A., CF-SLP Speech Language Pathology, Acute Rehabilitation Services Secure Chat preferred [phone] 08/19/2023, 3:43 PM  ECHOCARDIOGRAM LIMITED  Result Date: 08/19/2023    ECHOCARDIOGRAM LIMITED REPORT   Patient Name:   Cono Goulart Date of Exam: 08/19/2023 Medical Rec #:  [id number]  Height:       68.0 in Accession #:    [id number] Weight:       186.9 lb Date of Birth:  1971-03-02  BSA:          1.986 m Patient Age:    52 years   BP:           112/82 mmHg Patient Gender: M          HR:           79 bpm. Exam Location:  Inpatient Procedure: Limited Echo and Limited Color Doppler Indications:    Abnormal ECG  History:        Patient has prior history of  Echocardiogram examinations, most                 recent 03/16/2023. Stroke; Risk Factors:Sleep Apnea. Seizures,                 hypothyroidism, brain cancer.  Sonographer:    Milda Smart Referring Phys: [id number] NISCHAL NARENDRA IMPRESSIONS  1. Left ventricular ejection fraction, by estimation, is 60 to 65%. The left ventricle has normal function. The left ventricle has no regional wall motion abnormalities. Left ventricular diastolic function could not be evaluated.  2. Right ventricular systolic function is normal. The right ventricular size is normal. Tricuspid regurgitation signal is inadequate for assessing PA pressure.  3. The mitral valve is normal in structure. No evidence of mitral valve regurgitation.  4. The aortic valve is  normal in structure. Aortic valve regurgitation is not visualized.  5. The inferior vena cava is normal in size with greater than 50% respiratory variability, suggesting right atrial pressure of 3 mmHg. FINDINGS  Left Ventricle: Left ventricular ejection fraction, by estimation, is 60 to 65%. The left ventricle has normal function. The left ventricle has no regional wall motion abnormalities. The left ventricular internal cavity size was normal in size. There is  no left ventricular hypertrophy. Left ventricular diastolic function could not be evaluated. Right Ventricle: The right ventricular size is normal. No increase in right ventricular wall thickness. Right ventricular systolic function is normal. Tricuspid regurgitation signal is inadequate for assessing PA pressure. Left Atrium: Left atrial size was normal in size. Right Atrium: Right atrial size was normal in size. Pericardium: There is no evidence of pericardial effusion. Mitral Valve: The mitral valve is normal in structure. Tricuspid Valve: The tricuspid valve is normal in structure. Tricuspid valve regurgitation is trivial. No evidence of tricuspid stenosis. Aortic Valve: The aortic valve is normal in structure. Aortic  valve regurgitation is not visualized. Pulmonic Valve: The pulmonic valve was normal in structure. Pulmonic valve regurgitation is trivial. No evidence of pulmonic stenosis. Aorta: Aortic root could not be assessed. Venous: The inferior vena cava is normal in size with greater than 50% respiratory variability, suggesting right atrial pressure of 3 mmHg. IAS/Shunts: No atrial level shunt detected by color flow Doppler. Additional Comments: Color Doppler performed.  LEFT VENTRICLE PLAX 2D LVIDd:         4.10 cm LVIDs:         2.70 cm LV PW:         1.00 cm LV IVS:        1.00 cm LVOT diam:     2.20 cm LVOT Area:     3.80 cm  IVC IVC diam: 1.30 cm LEFT ATRIUM         Index LA diam:    3.00 cm 1.51 cm/m   AORTA Ao Root diam: 2.90 cm Ao Asc diam:  2.70 cm  SHUNTS Systemic Diam: 2.20 cm Armanda Magic MD Electronically signed by Armanda Magic MD Signature Date/Time: 08/19/2023/10:14:24 AM    Final    EEG adult  Result Date: 08/19/2023 Charlsie Quest, MD     08/19/2023  8:49 AM Patient Name: Khory Mazzarese MRN: [id number] Epilepsy Attending: Charlsie Quest Referring Physician/Provider: Caryl Pina, MD Date: 08/19/2023 Duration: 24.34 mins Patient history: 52yo M with left sided weakness getting eeg to evaluate for seizure Level of alertness: Awake, asleep AEDs during EEG study: Xanax, Oxcarb Technical aspects: This EEG study was done with scalp electrodes positioned according to the 10-20 International system of electrode placement. Electrical activity was reviewed with band pass filter of 1-70Hz , sensitivity of 7 uV/mm, display speed of 40mm/sec with a 60Hz  notched filter applied as appropriate. EEG data were recorded continuously and digitally stored.  Video monitoring was available and reviewed as appropriate. Description: The posterior dominant rhythm consists of 8Hz  activity of moderate voltage (25-35 uV) seen predominantly in posterior head regions, symmetric and reactive to eye opening and eye closing. Sleep  was characterized by vertex waves, sleep spindles (12 to 14 Hz), maximal frontocentral region. Physiologic photic driving was not seen during photic stimulation.  Hyperventilation was not performed.   IMPRESSION: This study is within normal limits. No seizures or epileptiform discharges were seen throughout the recording. A normal interictal EEG does not exclude the diagnosis of epilepsy. Priyanka Annabelle Harman  CT ANGIO HEAD NECK W WO CM  Result Date: 08/19/2023 CLINICAL DATA:  Initial evaluation for neuro deficit, stroke suspected. No other relevant history provided. EXAM: CT ANGIOGRAPHY HEAD AND NECK WITH AND WITHOUT CONTRAST TECHNIQUE: Multidetector CT imaging of the head and neck was performed using the standard protocol during bolus administration of intravenous contrast. Multiplanar CT image reconstructions and MIPs were obtained to evaluate the vascular anatomy. Carotid stenosis measurements (when applicable) are obtained utilizing NASCET criteria, using the distal internal carotid diameter as the denominator. RADIATION DOSE REDUCTION: This exam was performed according to the departmental dose-optimization program which includes automated exposure control, adjustment of the mA and/or kV according to patient size and/or use of iterative reconstruction technique. CONTRAST:  75mL OMNIPAQUE IOHEXOL 350 MG/ML SOLN COMPARISON:  CT from 08/18/2023 FINDINGS: CT HEAD FINDINGS Brain: Streak artifact from bilateral cochlear implants again noted. Right parieto-occipital approach VP shunt catheter in place with tip terminating at the left basal ganglia. Stable ventricular size and morphology without hydrocephalus. Chronic bilateral subdural hematomas without significant mass effect, stable. Sequelae of prior suboccipital craniectomy with parenchymal calcifications within the pons, cerebellum, and occipital regions bilaterally. Few remote lacunar infarcts about the deep gray nuclei. No visible acute intracranial  hemorrhage. No visible large vessel territory infarct. No appreciable mass lesion or mass effect. Vascular: No abnormal hyperdense vessel. Skull: Prior suboccipital craniectomy. Right posterior burr hole craniotomy, with remote right frontal craniotomy as well. Visualized osseous structures demonstrate a diffusely heterogeneous appearance without focal lesion. Sinuses/Orbits: Globes and orbital soft tissues demonstrate no acute finding. Paranasal sinuses are clear. Other: Sequelae of prior mastoidectomy with bilateral cochlear implants in place. Review of the MIP images confirms the above findings CTA NECK FINDINGS Aortic arch: Examination degraded by motion artifact. Visualized aortic arch within normal limits for caliber with standard 3 vessel morphology. No stenosis about the origin the great vessels. Right carotid system: Right common and internal carotid arteries are patent without stenosis or dissection. Left carotid system: Left common and internal carotid arteries are patent without stenosis or dissection. Vertebral arteries: Both vertebral arteries arise from the subclavian arteries. Proximal right vertebral artery not well assessed due to motion and adjacent venous contamination. Visualized vertebral arteries patent without stenosis or dissection. Skeleton: No visible discrete or worrisome osseous lesions. Other neck: No other acute finding. Upper chest: No other acute finding. Review of the MIP images confirms the above findings CTA HEAD FINDINGS Anterior circulation: Minor atheromatous change present about the carotid siphons without stenosis. A1 segments patent bilaterally. Normal anterior communicating artery complex. Anterior cerebral arteries patent to their distal aspects without stenosis. No M1 stenosis or occlusion. No proximal MCA branch occlusion or high-grade stenosis. Visualized distal MCA branches well perfused and symmetric. Posterior circulation: Both V4 segments widely patent without  stenosis. Neither PICA visualized. Basilar patent without stenosis. Both superior cerebellar arteries patent at their origins. The right SCA is markedly attenuated and irregular. Both PCAs primarily supplied via the basilar. Both PCAs widely patent to their distal aspects without stenosis. Venous sinuses: Not well assessed due to timing of the contrast bolus. Anatomic variants: None significant.  No visible aneurysm. Review of the MIP images confirms the above findings IMPRESSION: CT HEAD: 1. No acute intracranial abnormality. 2. Right parieto-occipital approach VP shunt catheter in place with tip terminating at the left basal ganglia. Stable ventricular size and morphology without hydrocephalus. 3. Chronic bilateral subdural hematomas without significant mass effect, stable. 4. Sequelae of prior suboccipital craniectomy with parenchymal calcifications within the  pons, cerebellum, and occipital regions bilaterally. 5. Few remote lacunar infarcts about the deep gray nuclei. CTA HEAD AND NECK: 1. Negative CTA for large vessel occlusion or other emergent finding. 2. No hemodynamically significant or correctable stenosis about the major arterial vasculature of the head and neck. 3. 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 Electronically Signed   By: Rise Mu M.D.   On: 08/19/2023 03:37   CT HEAD CODE STROKE WO CONTRAST  Result Date: 08/18/2023 CLINICAL DATA:  Code stroke. Neuro deficit, acute, stroke suspected. EXAM: CT HEAD WITHOUT CONTRAST TECHNIQUE: Contiguous axial images were obtained from the base of the skull through the vertex without intravenous contrast. RADIATION DOSE REDUCTION: This exam was performed according to the departmental dose-optimization program which includes automated exposure control, adjustment of the mA and/or kV according to patient size and/or use of iterative reconstruction technique. COMPARISON:  Head CT  02/27/2021. FINDINGS: Brain: Within limits of metal artifact from bilateral cochlear implants, no acute hemorrhage. Unchanged thin, chronic bilateral frontoparietal subdural hematomas. Unchanged scattered parenchymal calcifications, greatest in the posterior fossa and medial aspects of the bilateral occipital lobes. Old perforator infarcts in the bilateral thalami posterior limb of the left internal capsule. No new loss of gray-white differentiation. Unchanged right posterior approach ventricular shunt catheter with tip traversing the body of the left lateral ventricle. Stable size and configuration of the shunted ventricles. Stable postoperative changes of suboccipital craniotomy and resection of a posterior fossa mass. Vascular: No hyperdense vessel or unexpected calcification. Skull: Old right frontal right parietal burr holes. Prior suboccipital craniectomy. Sinuses/Orbits: No acute findings. Other: None. ASPECTS [facility] Stroke Program Early CT Score) - Ganglionic level infarction (caudate, lentiform nuclei, internal capsule, insula, M1-M3 cortex): 7 - Supraganglionic infarction (M4-M6 cortex): 3 Total score (0-10 with 10 being normal): 10 IMPRESSION: 1. No acute intracranial hemorrhage or evidence of acute infarct. ASPECT score is 10. 2. Unchanged thin, chronic bilateral frontoparietal subdural hematomas. 3. Unchanged right posterior approach ventricular shunt catheter with stable size and configuration of the shunted ventricles. 4. Stable postoperative changes of suboccipital craniotomy and resection of a posterior fossa mass. Code stroke imaging results were communicated on 08/18/2023 at 11:26 am to provider Dr. Otelia Limes via secure text paging. Electronically Signed   By: Orvan Falconer M.D.   On: 08/18/2023 11:26    Signed: Monna Fam, MD PGY-1 08/23/2023, 12:11 PM   Pager: [phone]

## 2023-08-23 NOTE — Plan of Care (Signed)
Problem: Education: Goal: Knowledge of General Education information will improve Description: Including pain rating scale, medication(s)/side effects and non-pharmacologic comfort measures 08/23/2023 1230 by Juluis Mire, RN Outcome: Adequate for Discharge 08/23/2023 1230 by Juluis Mire, RN Outcome: Adequate for Discharge   Problem: Health Behavior/Discharge Planning: Goal: Ability to manage health-related needs will improve 08/23/2023 1230 by Juluis Mire, RN Outcome: Adequate for Discharge 08/23/2023 1230 by Juluis Mire, RN Outcome: Adequate for Discharge   Problem: Clinical Measurements: Goal: Ability to maintain clinical measurements within normal limits will improve 08/23/2023 1230 by Juluis Mire, RN Outcome: Adequate for Discharge 08/23/2023 1230 by Juluis Mire, RN Outcome: Adequate for Discharge Goal: Will remain free from infection 08/23/2023 1230 by Juluis Mire, RN Outcome: Adequate for Discharge 08/23/2023 1230 by Juluis Mire, RN Outcome: Adequate for Discharge Goal: Diagnostic test results will improve 08/23/2023 1230 by Juluis Mire, RN Outcome: Adequate for Discharge 08/23/2023 1230 by Juluis Mire, RN Outcome: Adequate for Discharge Goal: Respiratory complications will improve 08/23/2023 1230 by Juluis Mire, RN Outcome: Adequate for Discharge 08/23/2023 1230 by Juluis Mire, RN Outcome: Adequate for Discharge Goal: Cardiovascular complication will be avoided 08/23/2023 1230 by Juluis Mire, RN Outcome: Adequate for Discharge 08/23/2023 1230 by Juluis Mire, RN Outcome: Adequate for Discharge   Problem: Activity: Goal: Risk for activity intolerance will decrease 08/23/2023 1230 by Juluis Mire, RN Outcome: Adequate for Discharge 08/23/2023 1230 by Juluis Mire, RN Outcome: Adequate for Discharge   Problem: Nutrition: Goal: Adequate  nutrition will be maintained 08/23/2023 1230 by Juluis Mire, RN Outcome: Adequate for Discharge 08/23/2023 1230 by Juluis Mire, RN Outcome: Adequate for Discharge   Problem: Coping: Goal: Level of anxiety will decrease 08/23/2023 1230 by Juluis Mire, RN Outcome: Adequate for Discharge 08/23/2023 1230 by Juluis Mire, RN Outcome: Adequate for Discharge   Problem: Elimination: Goal: Will not experience complications related to bowel motility 08/23/2023 1230 by Juluis Mire, RN Outcome: Adequate for Discharge 08/23/2023 1230 by Juluis Mire, RN Outcome: Adequate for Discharge Goal: Will not experience complications related to urinary retention 08/23/2023 1230 by Juluis Mire, RN Outcome: Adequate for Discharge 08/23/2023 1230 by Juluis Mire, RN Outcome: Adequate for Discharge   Problem: Pain Management: Goal: General experience of comfort will improve 08/23/2023 1230 by Juluis Mire, RN Outcome: Adequate for Discharge 08/23/2023 1230 by Juluis Mire, RN Outcome: Adequate for Discharge   Problem: Safety: Goal: Ability to remain free from injury will improve 08/23/2023 1230 by Juluis Mire, RN Outcome: Adequate for Discharge 08/23/2023 1230 by Juluis Mire, RN Outcome: Adequate for Discharge   Problem: Skin Integrity: Goal: Risk for impaired skin integrity will decrease 08/23/2023 1230 by Juluis Mire, RN Outcome: Adequate for Discharge 08/23/2023 1230 by Juluis Mire, RN Outcome: Adequate for Discharge   Problem: Education: Goal: Knowledge of disease or condition will improve 08/23/2023 1230 by Juluis Mire, RN Outcome: Adequate for Discharge 08/23/2023 1230 by Juluis Mire, RN Outcome: Adequate for Discharge Goal: Knowledge of secondary prevention will improve (MUST DOCUMENT ALL) 08/23/2023 1230 by Juluis Mire, RN Outcome: Adequate for  Discharge 08/23/2023 1230 by Juluis Mire, RN Outcome: Adequate for Discharge Goal: Knowledge of patient specific risk factors will improve Loraine Leriche N/A or DELETE if not current risk factor) 08/23/2023 1230 by Juluis Mire, RN Outcome: Adequate for Discharge 08/23/2023 1230 by Juluis Mire, RN Outcome:  Adequate for Discharge   Problem: Ischemic Stroke/TIA Tissue Perfusion: Goal: Complications of ischemic stroke/TIA will be minimized 08/23/2023 1230 by Juluis Mire, RN Outcome: Adequate for Discharge 08/23/2023 1230 by Juluis Mire, RN Outcome: Adequate for Discharge   Problem: Coping: Goal: Will verbalize positive feelings about self 08/23/2023 1230 by Juluis Mire, RN Outcome: Adequate for Discharge 08/23/2023 1230 by Juluis Mire, RN Outcome: Adequate for Discharge   Problem: Self-Care: Goal: Ability to participate in self-care as condition permits will improve 08/23/2023 1230 by Juluis Mire, RN Outcome: Adequate for Discharge 08/23/2023 1230 by Juluis Mire, RN Outcome: Adequate for Discharge Goal: Verbalization of feelings and concerns over difficulty with self-care will improve 08/23/2023 1230 by Juluis Mire, RN Outcome: Adequate for Discharge 08/23/2023 1230 by Juluis Mire, RN Outcome: Adequate for Discharge Goal: Ability to communicate needs accurately will improve 08/23/2023 1230 by Juluis Mire, RN Outcome: Adequate for Discharge 08/23/2023 1230 by Juluis Mire, RN Outcome: Adequate for Discharge   Problem: Nutrition: Goal: Risk of aspiration will decrease 08/23/2023 1230 by Juluis Mire, RN Outcome: Adequate for Discharge 08/23/2023 1230 by Juluis Mire, RN Outcome: Adequate for Discharge Goal: Dietary intake will improve 08/23/2023 1230 by Juluis Mire, RN Outcome: Adequate for Discharge 08/23/2023 1230 by Juluis Mire, RN Outcome:  Adequate for Discharge

## 2023-08-23 NOTE — TOC Transition Note (Signed)
Transition of Care [facility]) - CM/SW Discharge Note   Patient Details  Name: [name] Love MRN: [id number] Date of Birth: [birth year]  [facility] CM/SW Contact:  Baldemar Lenis, LCSW Phone Number: 08/23/2023, 1:46 PM   Clinical Narrative:   Patient received insurance approval to admit to SNF today. CSW confirmed with [facility] that bed is available. CSW updated MD, sent discharge information to [facility]. CSW attempted to update cousin April, left a voicemail. Transport arranged with [facility] for next available.  Nurse to call report to [phone], Room 208A.    Final next level of care: Skilled Nursing Facility Barriers to Discharge: Barriers Resolved   Patient Goals and CMS Choice CMS Medicare.gov Compare Post Acute Care list provided to:: Patient Choice offered to / list presented to : Patient  Discharge Placement                Patient chooses bed at: [facility] Patient to be transferred to facility by: [facility] Name of family member notified: April Patient and family notified of of transfer: 08/23/23  Discharge Plan and Services Additional resources added to the After Visit Summary for       Post Acute Care Choice: Skilled Nursing Facility                               Social Determinants of Health (SDOH) Interventions SDOH Screenings   Food Insecurity: No Food Insecurity (08/22/2023)  Housing: Low Risk  (08/23/2023)  Transportation Needs: No Transportation Needs (08/22/2023)  Utilities: Not At Risk (08/22/2023)  Financial Resource Strain: Low Risk  (06/07/2018)  Physical Activity: Unknown (06/07/2018)  Social Connections: Unknown (06/07/2018)  Stress: No Stress Concern Present (06/07/2018)  Tobacco Use: Low Risk  (08/22/2023)     Readmission Risk Interventions     No data to display

## 2023-08-25 DIAGNOSIS — R5381 Other malaise: Secondary | ICD-10-CM | POA: Diagnosis not present

## 2023-08-25 DIAGNOSIS — Z7189 Other specified counseling: Secondary | ICD-10-CM | POA: Diagnosis not present

## 2023-08-25 DIAGNOSIS — I639 Cerebral infarction, unspecified: Secondary | ICD-10-CM | POA: Diagnosis not present

## 2023-08-25 DIAGNOSIS — R29898 Other symptoms and signs involving the musculoskeletal system: Secondary | ICD-10-CM | POA: Diagnosis not present

## 2023-08-30 DIAGNOSIS — R29898 Other symptoms and signs involving the musculoskeletal system: Secondary | ICD-10-CM | POA: Diagnosis not present

## 2023-08-30 DIAGNOSIS — R5381 Other malaise: Secondary | ICD-10-CM | POA: Diagnosis not present

## 2023-08-30 DIAGNOSIS — I639 Cerebral infarction, unspecified: Secondary | ICD-10-CM | POA: Diagnosis not present

## 2023-09-05 ENCOUNTER — Telehealth: Payer: Self-pay | Admitting: Neurology

## 2023-09-05 NOTE — Telephone Encounter (Signed)
Pt's cousin, April Stephens Need paperwork fill out for patient to be able to move a apartment downstair with no fee.  Due to having a stroke and will be in a wheelchair a while. Currently in [facility] for physical therapy, occupational therapy to help patient walk. Would like a call back.

## 2023-09-08 NOTE — Telephone Encounter (Signed)
[name] Love(cousin on DPR) was called, the message from RN was relayed.

## 2023-09-15 DIAGNOSIS — R5381 Other malaise: Secondary | ICD-10-CM | POA: Diagnosis not present

## 2023-09-15 DIAGNOSIS — R29898 Other symptoms and signs involving the musculoskeletal system: Secondary | ICD-10-CM | POA: Diagnosis not present

## 2023-09-15 DIAGNOSIS — I639 Cerebral infarction, unspecified: Secondary | ICD-10-CM | POA: Diagnosis not present

## 2023-09-20 DIAGNOSIS — F445 Conversion disorder with seizures or convulsions: Secondary | ICD-10-CM | POA: Diagnosis not present

## 2023-09-20 DIAGNOSIS — F419 Anxiety disorder, unspecified: Secondary | ICD-10-CM | POA: Diagnosis not present

## 2023-09-20 DIAGNOSIS — H9190 Unspecified hearing loss, unspecified ear: Secondary | ICD-10-CM | POA: Diagnosis not present

## 2023-09-20 DIAGNOSIS — Z7902 Long term (current) use of antithrombotics/antiplatelets: Secondary | ICD-10-CM | POA: Diagnosis not present

## 2023-09-20 DIAGNOSIS — R1310 Dysphagia, unspecified: Secondary | ICD-10-CM | POA: Diagnosis not present

## 2023-09-20 DIAGNOSIS — Z993 Dependence on wheelchair: Secondary | ICD-10-CM | POA: Diagnosis not present

## 2023-09-20 DIAGNOSIS — I10 Essential (primary) hypertension: Secondary | ICD-10-CM | POA: Diagnosis not present

## 2023-09-20 DIAGNOSIS — Z9181 History of falling: Secondary | ICD-10-CM | POA: Diagnosis not present

## 2023-09-20 DIAGNOSIS — I69344 Monoplegia of lower limb following cerebral infarction affecting left non-dominant side: Secondary | ICD-10-CM | POA: Diagnosis not present

## 2023-09-20 DIAGNOSIS — H539 Unspecified visual disturbance: Secondary | ICD-10-CM | POA: Diagnosis not present

## 2023-09-20 DIAGNOSIS — M6250 Muscle wasting and atrophy, not elsewhere classified, unspecified site: Secondary | ICD-10-CM | POA: Diagnosis not present

## 2023-09-20 DIAGNOSIS — G8191 Hemiplegia, unspecified affecting right dominant side: Secondary | ICD-10-CM | POA: Diagnosis not present

## 2023-09-20 DIAGNOSIS — Z556 Problems related to health literacy: Secondary | ICD-10-CM | POA: Diagnosis not present

## 2023-09-21 DIAGNOSIS — H9190 Unspecified hearing loss, unspecified ear: Secondary | ICD-10-CM | POA: Diagnosis not present

## 2023-09-21 DIAGNOSIS — Z556 Problems related to health literacy: Secondary | ICD-10-CM | POA: Diagnosis not present

## 2023-09-21 DIAGNOSIS — F445 Conversion disorder with seizures or convulsions: Secondary | ICD-10-CM | POA: Diagnosis not present

## 2023-09-21 DIAGNOSIS — F419 Anxiety disorder, unspecified: Secondary | ICD-10-CM | POA: Diagnosis not present

## 2023-09-21 DIAGNOSIS — R1310 Dysphagia, unspecified: Secondary | ICD-10-CM | POA: Diagnosis not present

## 2023-09-21 DIAGNOSIS — Z993 Dependence on wheelchair: Secondary | ICD-10-CM | POA: Diagnosis not present

## 2023-09-21 DIAGNOSIS — I10 Essential (primary) hypertension: Secondary | ICD-10-CM | POA: Diagnosis not present

## 2023-09-21 DIAGNOSIS — Z9181 History of falling: Secondary | ICD-10-CM | POA: Diagnosis not present

## 2023-09-21 DIAGNOSIS — H539 Unspecified visual disturbance: Secondary | ICD-10-CM | POA: Diagnosis not present

## 2023-09-21 DIAGNOSIS — G8191 Hemiplegia, unspecified affecting right dominant side: Secondary | ICD-10-CM | POA: Diagnosis not present

## 2023-09-21 DIAGNOSIS — I69344 Monoplegia of lower limb following cerebral infarction affecting left non-dominant side: Secondary | ICD-10-CM | POA: Diagnosis not present

## 2023-09-21 DIAGNOSIS — Z7902 Long term (current) use of antithrombotics/antiplatelets: Secondary | ICD-10-CM | POA: Diagnosis not present

## 2023-09-21 DIAGNOSIS — M6250 Muscle wasting and atrophy, not elsewhere classified, unspecified site: Secondary | ICD-10-CM | POA: Diagnosis not present

## 2023-09-22 ENCOUNTER — Telehealth: Payer: Self-pay | Admitting: Cardiology

## 2023-09-22 ENCOUNTER — Telehealth: Payer: Self-pay | Admitting: Neurology

## 2023-09-22 DIAGNOSIS — I69351 Hemiplegia and hemiparesis following cerebral infarction affecting right dominant side: Secondary | ICD-10-CM | POA: Diagnosis not present

## 2023-09-22 DIAGNOSIS — I6330 Cerebral infarction due to thrombosis of unspecified cerebral artery: Secondary | ICD-10-CM | POA: Diagnosis not present

## 2023-09-22 DIAGNOSIS — I6381 Other cerebral infarction due to occlusion or stenosis of small artery: Secondary | ICD-10-CM | POA: Diagnosis not present

## 2023-09-22 DIAGNOSIS — F445 Conversion disorder with seizures or convulsions: Secondary | ICD-10-CM | POA: Diagnosis not present

## 2023-09-22 NOTE — Telephone Encounter (Signed)
Patient was in the hospital and rehab because he has had a stroke. Patient would like to know if he needs any medications to be adjusted. Please advise.

## 2023-09-22 NOTE — Telephone Encounter (Signed)
Received a call from patient, he stated that he received a Depakote from the pharmacy prescribed by Dr. Terrace Arabia. Depakote was discontinued and he is supposed to be on Oxcarbazepine. Advise him to not take the Depakote and continue on Oxcarbazepine. He voiced understanding.   Dr. Teresa Coombs

## 2023-09-23 DIAGNOSIS — R1310 Dysphagia, unspecified: Secondary | ICD-10-CM | POA: Diagnosis not present

## 2023-09-23 DIAGNOSIS — G8191 Hemiplegia, unspecified affecting right dominant side: Secondary | ICD-10-CM | POA: Diagnosis not present

## 2023-09-23 DIAGNOSIS — Z7902 Long term (current) use of antithrombotics/antiplatelets: Secondary | ICD-10-CM | POA: Diagnosis not present

## 2023-09-23 DIAGNOSIS — F445 Conversion disorder with seizures or convulsions: Secondary | ICD-10-CM | POA: Diagnosis not present

## 2023-09-23 DIAGNOSIS — H9190 Unspecified hearing loss, unspecified ear: Secondary | ICD-10-CM | POA: Diagnosis not present

## 2023-09-23 DIAGNOSIS — H539 Unspecified visual disturbance: Secondary | ICD-10-CM | POA: Diagnosis not present

## 2023-09-23 DIAGNOSIS — Z9181 History of falling: Secondary | ICD-10-CM | POA: Diagnosis not present

## 2023-09-23 DIAGNOSIS — I10 Essential (primary) hypertension: Secondary | ICD-10-CM | POA: Diagnosis not present

## 2023-09-23 DIAGNOSIS — I69344 Monoplegia of lower limb following cerebral infarction affecting left non-dominant side: Secondary | ICD-10-CM | POA: Diagnosis not present

## 2023-09-23 DIAGNOSIS — F419 Anxiety disorder, unspecified: Secondary | ICD-10-CM | POA: Diagnosis not present

## 2023-09-23 DIAGNOSIS — Z556 Problems related to health literacy: Secondary | ICD-10-CM | POA: Diagnosis not present

## 2023-09-23 DIAGNOSIS — M6250 Muscle wasting and atrophy, not elsewhere classified, unspecified site: Secondary | ICD-10-CM | POA: Diagnosis not present

## 2023-09-23 DIAGNOSIS — Z993 Dependence on wheelchair: Secondary | ICD-10-CM | POA: Diagnosis not present

## 2023-09-27 DIAGNOSIS — H9190 Unspecified hearing loss, unspecified ear: Secondary | ICD-10-CM | POA: Diagnosis not present

## 2023-09-27 DIAGNOSIS — M6250 Muscle wasting and atrophy, not elsewhere classified, unspecified site: Secondary | ICD-10-CM | POA: Diagnosis not present

## 2023-09-27 DIAGNOSIS — F445 Conversion disorder with seizures or convulsions: Secondary | ICD-10-CM | POA: Diagnosis not present

## 2023-09-27 DIAGNOSIS — Z556 Problems related to health literacy: Secondary | ICD-10-CM | POA: Diagnosis not present

## 2023-09-27 DIAGNOSIS — G8191 Hemiplegia, unspecified affecting right dominant side: Secondary | ICD-10-CM | POA: Diagnosis not present

## 2023-09-27 DIAGNOSIS — I69344 Monoplegia of lower limb following cerebral infarction affecting left non-dominant side: Secondary | ICD-10-CM | POA: Diagnosis not present

## 2023-09-27 DIAGNOSIS — H539 Unspecified visual disturbance: Secondary | ICD-10-CM | POA: Diagnosis not present

## 2023-09-27 DIAGNOSIS — Z993 Dependence on wheelchair: Secondary | ICD-10-CM | POA: Diagnosis not present

## 2023-09-27 DIAGNOSIS — F419 Anxiety disorder, unspecified: Secondary | ICD-10-CM | POA: Diagnosis not present

## 2023-09-27 DIAGNOSIS — Z9181 History of falling: Secondary | ICD-10-CM | POA: Diagnosis not present

## 2023-09-27 DIAGNOSIS — Z7902 Long term (current) use of antithrombotics/antiplatelets: Secondary | ICD-10-CM | POA: Diagnosis not present

## 2023-09-27 DIAGNOSIS — R1310 Dysphagia, unspecified: Secondary | ICD-10-CM | POA: Diagnosis not present

## 2023-09-27 DIAGNOSIS — I10 Essential (primary) hypertension: Secondary | ICD-10-CM | POA: Diagnosis not present

## 2023-09-29 ENCOUNTER — Other Ambulatory Visit: Payer: Self-pay

## 2023-09-29 ENCOUNTER — Emergency Department (HOSPITAL_COMMUNITY): Payer: Medicare Other

## 2023-09-29 ENCOUNTER — Encounter (HOSPITAL_COMMUNITY): Payer: Self-pay | Admitting: Emergency Medicine

## 2023-09-29 ENCOUNTER — Emergency Department (HOSPITAL_COMMUNITY)
Admission: EM | Admit: 2023-09-29 | Discharge: 2023-09-29 | Disposition: A | Discharge status: Home / Self Care | Payer: Medicare Other | Attending: Emergency Medicine | Admitting: Emergency Medicine

## 2023-09-29 DIAGNOSIS — Z4682 Encounter for fitting and adjustment of non-vascular catheter: Secondary | ICD-10-CM | POA: Diagnosis not present

## 2023-09-29 DIAGNOSIS — M546 Pain in thoracic spine: Secondary | ICD-10-CM | POA: Insufficient documentation

## 2023-09-29 DIAGNOSIS — Z8673 Personal history of transient ischemic attack (TIA), and cerebral infarction without residual deficits: Secondary | ICD-10-CM | POA: Insufficient documentation

## 2023-09-29 DIAGNOSIS — M542 Cervicalgia: Secondary | ICD-10-CM | POA: Insufficient documentation

## 2023-09-29 DIAGNOSIS — S199XXA Unspecified injury of neck, initial encounter: Secondary | ICD-10-CM | POA: Diagnosis not present

## 2023-09-29 DIAGNOSIS — S42002A Fracture of unspecified part of left clavicle, initial encounter for closed fracture: Secondary | ICD-10-CM | POA: Diagnosis not present

## 2023-09-29 DIAGNOSIS — I10 Essential (primary) hypertension: Secondary | ICD-10-CM | POA: Diagnosis not present

## 2023-09-29 DIAGNOSIS — M549 Dorsalgia, unspecified: Secondary | ICD-10-CM | POA: Diagnosis not present

## 2023-09-29 DIAGNOSIS — R569 Unspecified convulsions: Secondary | ICD-10-CM | POA: Diagnosis not present

## 2023-09-29 DIAGNOSIS — R519 Headache, unspecified: Secondary | ICD-10-CM | POA: Diagnosis not present

## 2023-09-29 DIAGNOSIS — W19XXXA Unspecified fall, initial encounter: Secondary | ICD-10-CM | POA: Insufficient documentation

## 2023-09-29 DIAGNOSIS — R531 Weakness: Secondary | ICD-10-CM | POA: Diagnosis not present

## 2023-09-29 DIAGNOSIS — S3992XA Unspecified injury of lower back, initial encounter: Secondary | ICD-10-CM | POA: Diagnosis not present

## 2023-09-29 DIAGNOSIS — S0990XA Unspecified injury of head, initial encounter: Secondary | ICD-10-CM | POA: Diagnosis not present

## 2023-09-29 DIAGNOSIS — R0789 Other chest pain: Secondary | ICD-10-CM | POA: Diagnosis not present

## 2023-09-29 LAB — CBC WITH DIFFERENTIAL/PLATELET
Abs Immature Granulocytes: 0.02 10*3/uL (ref 0.00–0.07)
Basophils Absolute: 0 10*3/uL (ref 0.0–0.1)
Basophils Relative: 0 %
Eosinophils Absolute: 0.1 10*3/uL (ref 0.0–0.5)
Eosinophils Relative: 3 %
HCT: 43.3 % (ref 39.0–52.0)
Hemoglobin: 15.1 g/dL (ref 13.0–17.0)
Immature Granulocytes: 0 %
Lymphocytes Relative: 24 %
Lymphs Abs: 1.1 10*3/uL (ref 0.7–4.0)
MCH: 31.3 pg (ref 26.0–34.0)
MCHC: 34.9 g/dL (ref 30.0–36.0)
MCV: 89.6 fL (ref 80.0–100.0)
Monocytes Absolute: 0.4 10*3/uL (ref 0.1–1.0)
Monocytes Relative: 9 %
Neutro Abs: 3 10*3/uL (ref 1.7–7.7)
Neutrophils Relative %: 64 %
Platelets: 178 10*3/uL (ref 150–400)
RBC: 4.83 MIL/uL (ref 4.22–5.81)
RDW: 12.3 % (ref 11.5–15.5)
WBC: 4.7 10*3/uL (ref 4.0–10.5)
nRBC: 0 % (ref 0.0–0.2)

## 2023-09-29 LAB — COMPREHENSIVE METABOLIC PANEL
ALT: 27 U/L (ref 0–44)
AST: 23 U/L (ref 15–41)
Albumin: 3.9 g/dL (ref 3.5–5.0)
Alkaline Phosphatase: 71 U/L (ref 38–126)
Anion gap: 11 (ref 5–15)
BUN: 10 mg/dL (ref 6–20)
CO2: 24 mmol/L (ref 22–32)
Calcium: 9.2 mg/dL (ref 8.9–10.3)
Chloride: 104 mmol/L (ref 98–111)
Creatinine, Ser: 0.94 mg/dL (ref 0.61–1.24)
GFR, Estimated: >60 mL/min (ref >60)
Glucose, Bld: 100 mg/dL — ABNORMAL HIGH (ref 70–99)
Potassium: 3.1 mmol/L — ABNORMAL LOW (ref 3.5–5.1)
Sodium: 139 mmol/L (ref 135–145)
Total Bilirubin: 0.6 mg/dL (ref <1.2)
Total Protein: 6.5 g/dL (ref 6.5–8.1)

## 2023-09-29 MED ORDER — POTASSIUM CHLORIDE 20 MEQ PO PACK
40.0000 meq | PACK | Freq: Once | ORAL | Status: AC
  Administered 2023-09-29: 40 meq via ORAL
  Filled 2023-09-29: qty 2

## 2023-09-29 NOTE — ED Triage Notes (Signed)
Pt to ER via EMS form home.  Reports several falls over last several days, last this AM.  Reports hit head on wall with last fall.  Pt has hx of CVA and seizures.  States has been weaker than normal for last several days.  Pt forgot to take seizure meds last night and took them this AM which has made him more sleepy than normal.  A&O per EMS

## 2023-09-29 NOTE — ED Provider Notes (Signed)
[place] EMERGENCY DEPARTMENT AT [facility] Provider Note   CSN: [id number] Arrival date & time: 09/29/23  1058     History  Chief Complaint  Patient presents with   Fall   HPI [name] Love is a 52 y.o. 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  Occurred this morning.  Patient's states that he fell earlier this morning and hit his head on the wall.  States he may have created a hole in the wall.  Also reports multiple falls in the last couple days.  Does report that last night he fell asleep and forgot to take his seizure medication but did take it this morning. Now reporting that his head hurts all over as well as posterior midline neck pain as well as upper midline back pain.  Symptoms started just after the fall.  She reports that he is on Plavix.   Fall       Home Medications Prior to Admission medications   Medication Sig Start Date End Date Taking? Authorizing Provider  acetaminophen (TYLENOL) 500 MG tablet Take 2 tablets (1,000 mg total) by mouth every 8 (eight) hours. 08/23/23   Monna Fam, MD  ALPRAZolam Prudy Feeler) 0.5 MG tablet Take 1 tablet (0.5 mg total) by mouth 2 (two) times daily. 08/23/23   Monna Fam, MD  atorvastatin (LIPITOR) 40 MG tablet Take 1 tablet (40 mg total) by mouth daily. 08/24/23   Monna Fam, MD  clopidogrel (PLAVIX) 75 MG tablet TAKE 1 TABLET(75 MG) BY MOUTH DAILY 11/24/22   Georgeanna Lea, MD  cyanocobalamin 1000 MCG tablet Take 1 tablet (1,000 mcg total) by mouth daily. 08/24/23   Monna Fam, MD  DULoxetine (CYMBALTA) 30 MG capsule Take 1 capsule (30 mg total) by mouth daily. 08/24/23   Monna Fam, MD  fluticasone [facility]) 50 MCG/ACT nasal spray Place 2 sprays into both nostrils 2 (two) times daily as needed for  allergies or rhinitis. Patient not taking: Reported on 08/18/2023    [provider]  gabapentin (NEURONTIN) 300 MG capsule Take 1 capsule (300 mg total) by mouth 3 (three) times daily. 08/23/23   Monna Fam, MD  lidocaine (LIDODERM) 5 % Place 2 patches onto the skin daily as needed. Remove & Discard patch within 12 hours or as directed by MD 08/23/23   Monna Fam, MD  metoprolol succinate (TOPROL-XL) 50 MG 24 hr tablet Take 50 mg by mouth daily. Take with or immediately following a meal.    [provider]  Oxcarbazepine (TRILEPTAL) 300 MG tablet TAKE 1 TABLET(300 MG) BY MOUTH TWICE DAILY 08/22/23   Windell Norfolk, MD  polyethylene glycol (MIRALAX / GLYCOLAX) 17 g packet Take 17 g by mouth daily as needed for moderate constipation. 08/23/23   Monna Fam, MD      Allergies    Baclofen, Seroquel [quetiapine], Sulfa antibiotics, Coconut flavoring agent (non-screening), and Contrast media [iodinated contrast media]    Review of Systems   See HPI  Physical Exam Updated Vital Signs BP 104/78   Pulse 72   Temp 98.3 F (36.8 C)   Resp 15   Ht 5\' 8"  (1.727 m)   Wt 83 kg   SpO2 99%   BMI 27.83 kg/m  Physical Exam Vitals and nursing note reviewed.  HENT:     Head: Normocephalic and  atraumatic.     Mouth/Throat:     Mouth: Mucous membranes are moist.  Eyes:     General:        Right eye: No discharge.        Left eye: No discharge.     Conjunctiva/sclera: Conjunctivae normal.  Cardiovascular:     Rate and Rhythm: Normal rate and regular rhythm.     Pulses: Normal pulses.     Heart sounds: Normal heart sounds.  Pulmonary:     Effort: Pulmonary effort is normal.     Breath sounds: Normal breath sounds.  Abdominal:     General: Abdomen is flat. There is no distension.     Palpations: Abdomen is soft.     Tenderness: There is no abdominal tenderness.     Comments: Old bruises noted to the left and right lateral aspects of the abdomen.  Skin:    General:  Skin is warm and dry.  Neurological:     General: No focal deficit present.     Mental Status: He is alert and oriented to person, place, and time.     Comments: Left-sided facial droop noted.  Notable weakness in the left upper and lower extremities.  Patient is able to move all his extremities.  Psychiatric:        Mood and Affect: Mood normal.     ED Results / Procedures / Treatments   Labs (all labs ordered are listed, but only abnormal results are displayed) Labs Reviewed  COMPREHENSIVE METABOLIC PANEL - Abnormal; Notable for the following components:      Result Value   Potassium 3.1 (*)    Glucose, Bld 100 (*)    All other components within normal limits  CBC WITH DIFFERENTIAL/PLATELET  10-HYDROXYCARBAZEPINE  CBG MONITORING, ED    EKG EKG Interpretation Date/Time:  Thursday September 29 2023 11:09:53 EST Ventricular Rate:  74 PR Interval:  162 QRS Duration:  89 QT Interval:  383 QTC Calculation: 425 R Axis:   49  Text Interpretation: Sinus rhythm no acute ST/T changes similar to Nov 2024 Confirmed by Pricilla Loveless [phone]) on 09/29/2023 12:19:07 PM  Radiology CT Head Wo Contrast Result Date: 09/29/2023 CLINICAL DATA:  Provided history: Polytrauma, blunt. Additional history provided: Recent falls (with head trauma). History of CVA. Seizure history. Weakness. EXAM: CT HEAD WITHOUT CONTRAST CT CERVICAL SPINE WITHOUT CONTRAST TECHNIQUE: Multidetector CT imaging of the head and cervical spine was performed following the standard protocol without intravenous contrast. Multiplanar CT image reconstructions of the cervical spine were also generated. RADIATION DOSE REDUCTION: This exam was performed according to the departmental dose-optimization program which includes automated exposure control, adjustment of the mA and/or kV according to patient size and/or use of iterative reconstruction technique. COMPARISON:  Non-contrast head CT 08/18/2023.  Brain MRI 02/27/2021 FINDINGS: CT  HEAD FINDINGS Prominent artifact arising from bilateral cochlear implants, obscuring portions of the brain bilaterally. Within this limitation, findings are as follows. Brain: Right parietal approach ventricular catheter crossing midline and terminating in the region of the left caudate nucleus, unchanged. Stable size and configuration of the shunted ventricles. Thin chronic bilateral frontoparietal subdural hematomas, also unchanged from the prior head CT of 08/18/2023. Scattered parenchymal calcifications, greatest within the posterior fossa and within the medial aspects of the bilateral occipital lobes. Patchy and ill-defined hypoattenuation within the cerebral white matter, nonspecific but compatible with mild chronic small vessel ischemic disease. Stable postoperative changes of suboccipital craniectomy and posterior fossa mass resection. There is no acute  intracranial hemorrhage. No acute demarcated cortical infarct. No midline shift. Vascular: No hyperdense vessel.  Atherosclerotic calcifications. Skull: No calvarial fracture or aggressive osseous lesion. Prior suboccipital craniectomy. Sinuses/Orbits: No mass or acute finding within the imaged orbits. No significant paranasal sinus disease. CT CERVICAL SPINE FINDINGS Alignment: No significant spondylolisthesis. Skull base and vertebrae: The basion-dental and atlanto-dental intervals are maintained.No evidence of acute fracture to the cervical spine. Congenital nonunion of the posterior arch of C1. Soft tissues and spinal canal: No prevertebral fluid or swelling. No visible canal hematoma. Disc levels: Mild cervical spondylosis. No more than mild disc space narrowing. Shallow multilevel disc bulges. No significant spinal canal stenosis. Uncovertebral hypertrophy results in left-sided bony neural foraminal narrowing at C3-C4. Upper chest: No consolidation within the imaged lung apices. No visible pneumothorax. IMPRESSION: CT head: 1. Prominent metal artifact  arising from bilateral cochlear implants, partially obscuring the brain. Within this limitation, no evidence of an acute intracranial abnormality. 2. Thin chronic bilateral frontoparietal subdural hematomas, unchanged from the prior head CT of 08/18/2023. 3. Unchanged position of a right parietal approach ventricular shunt catheter. Stable size and configuration of the ventricular system. 4. Stable postoperative changes from prior suboccipital craniectomy and posterior fossa mass resection. 5. Scattered parenchymal calcifications, greatest within the posterior fossa and medial occipital lobes. CT cervical spine: 1. No evidence of an acute cervical spine fracture. 2. Cervical spondylosis as described. Electronically Signed   By: Jackey Loge D.O.   On: 09/29/2023 13:36   CT Cervical Spine Wo Contrast Result Date: 09/29/2023 CLINICAL DATA:  Provided history: Polytrauma, blunt. Additional history provided: Recent falls (with head trauma). History of CVA. Seizure history. Weakness. EXAM: CT HEAD WITHOUT CONTRAST CT CERVICAL SPINE WITHOUT CONTRAST TECHNIQUE: Multidetector CT imaging of the head and cervical spine was performed following the standard protocol without intravenous contrast. Multiplanar CT image reconstructions of the cervical spine were also generated. RADIATION DOSE REDUCTION: This exam was performed according to the departmental dose-optimization program which includes automated exposure control, adjustment of the mA and/or kV according to patient size and/or use of iterative reconstruction technique. COMPARISON:  Non-contrast head CT 08/18/2023.  Brain MRI 02/27/2021 FINDINGS: CT HEAD FINDINGS Prominent artifact arising from bilateral cochlear implants, obscuring portions of the brain bilaterally. Within this limitation, findings are as follows. Brain: Right parietal approach ventricular catheter crossing midline and terminating in the region of the left caudate nucleus, unchanged. Stable size and  configuration of the shunted ventricles. Thin chronic bilateral frontoparietal subdural hematomas, also unchanged from the prior head CT of 08/18/2023. Scattered parenchymal calcifications, greatest within the posterior fossa and within the medial aspects of the bilateral occipital lobes. Patchy and ill-defined hypoattenuation within the cerebral white matter, nonspecific but compatible with mild chronic small vessel ischemic disease. Stable postoperative changes of suboccipital craniectomy and posterior fossa mass resection. There is no acute intracranial hemorrhage. No acute demarcated cortical infarct. No midline shift. Vascular: No hyperdense vessel.  Atherosclerotic calcifications. Skull: No calvarial fracture or aggressive osseous lesion. Prior suboccipital craniectomy. Sinuses/Orbits: No mass or acute finding within the imaged orbits. No significant paranasal sinus disease. CT CERVICAL SPINE FINDINGS Alignment: No significant spondylolisthesis. Skull base and vertebrae: The basion-dental and atlanto-dental intervals are maintained.No evidence of acute fracture to the cervical spine. Congenital nonunion of the posterior arch of C1. Soft tissues and spinal canal: No prevertebral fluid or swelling. No visible canal hematoma. Disc levels: Mild cervical spondylosis. No more than mild disc space narrowing. Shallow multilevel disc bulges. No significant spinal canal  stenosis. Uncovertebral hypertrophy results in left-sided bony neural foraminal narrowing at C3-C4. Upper chest: No consolidation within the imaged lung apices. No visible pneumothorax. IMPRESSION: CT head: 1. Prominent metal artifact arising from bilateral cochlear implants, partially obscuring the brain. Within this limitation, no evidence of an acute intracranial abnormality. 2. Thin chronic bilateral frontoparietal subdural hematomas, unchanged from the prior head CT of 08/18/2023. 3. Unchanged position of a right parietal approach ventricular shunt  catheter. Stable size and configuration of the ventricular system. 4. Stable postoperative changes from prior suboccipital craniectomy and posterior fossa mass resection. 5. Scattered parenchymal calcifications, greatest within the posterior fossa and medial occipital lobes. CT cervical spine: 1. No evidence of an acute cervical spine fracture. 2. Cervical spondylosis as described. Electronically Signed   By: Jackey Loge D.O.   On: 09/29/2023 13:36   CT Thoracic Spine Wo Contrast Result Date: 09/29/2023 CLINICAL DATA:  Back trauma, no prior imaging (Age >= 16y) EXAM: CT THORACIC, AND LUMBAR SPINE WITHOUT CONTRAST TECHNIQUE: Multidetector CT imaging of the thoracic and lumbar spine was performed without intravenous contrast. Multiplanar CT image reconstructions were also generated. RADIATION DOSE REDUCTION: This exam was performed according to the departmental dose-optimization program which includes automated exposure control, adjustment of the mA and/or kV according to patient size and/or use of iterative reconstruction technique. COMPARISON:  None Available. FINDINGS: CT THORACIC SPINE FINDINGS Alignment: Normal. Vertebrae: No acute fracture or focal pathologic process. Paraspinal and other soft tissues: Right-sided central venous catheter in place. Disc levels: No CT evidence of high grade spinal canal stenosis. CT LUMBAR SPINE FINDINGS Segmentation: 5 lumbar type vertebrae. Alignment: Normal. Vertebrae: No acute fracture or focal pathologic process. Paraspinal and other soft tissues: Cholelithiasis without evidence of cholecystitis. Disc levels: No CT evidence of high-grade spinal canal stenosis IMPRESSION: No acute fracture or traumatic malalignment of the thoracic or lumbar spine. Electronically Signed   By: Lorenza Cambridge M.D.   On: 09/29/2023 13:32   CT Lumbar Spine Wo Contrast Result Date: 09/29/2023 CLINICAL DATA:  Back trauma, no prior imaging (Age >= 16y) EXAM: CT THORACIC, AND LUMBAR SPINE  WITHOUT CONTRAST TECHNIQUE: Multidetector CT imaging of the thoracic and lumbar spine was performed without intravenous contrast. Multiplanar CT image reconstructions were also generated. RADIATION DOSE REDUCTION: This exam was performed according to the departmental dose-optimization program which includes automated exposure control, adjustment of the mA and/or kV according to patient size and/or use of iterative reconstruction technique. COMPARISON:  None Available. FINDINGS: CT THORACIC SPINE FINDINGS Alignment: Normal. Vertebrae: No acute fracture or focal pathologic process. Paraspinal and other soft tissues: Right-sided central venous catheter in place. Disc levels: No CT evidence of high grade spinal canal stenosis. CT LUMBAR SPINE FINDINGS Segmentation: 5 lumbar type vertebrae. Alignment: Normal. Vertebrae: No acute fracture or focal pathologic process. Paraspinal and other soft tissues: Cholelithiasis without evidence of cholecystitis. Disc levels: No CT evidence of high-grade spinal canal stenosis IMPRESSION: No acute fracture or traumatic malalignment of the thoracic or lumbar spine. Electronically Signed   By: Lorenza Cambridge M.D.   On: 09/29/2023 13:32   DG Chest Port 1 View Result Date: 09/29/2023 CLINICAL DATA:  52 year old male with multiple recent falls. Weakness. EXAM: PORTABLE CHEST 1 VIEW COMPARISON:  Portable chest 02/27/2021. FINDINGS: Portable AP semi upright view at 1126 hours. Chronic right neck, upper chest catheter. Mediastinal contours remain normal. Low normal lung volumes. Allowing for portable technique the lungs are clear. Visualized tracheal air column is within normal limits. No pneumothorax or pleural  effusion. Negative visible bowel gas. Chronic left clavicle fracture. No acute osseous abnormality identified. IMPRESSION: No acute cardiopulmonary abnormality. Electronically Signed   By: Odessa Fleming M.D.   On: 09/29/2023 11:34    Procedures Procedures    Medications Ordered in  ED Medications  potassium chloride (KLOR-CON) packet 40 mEq (has no administration in time range)    ED Course/ Medical Decision Making/ A&P                                 Medical Decision Making Amount and/or Complexity of Data Reviewed Labs: ordered. Radiology: ordered.   Initial Impression and Ddx With-year-old well-appearing male presenting for multiple falls.  Exam notable for left facial droop, left-sided weakness and what appeared to be old bruises in the abdomen.  Per chart review, left-sided deficits are not new.  DDx includes stroke, seizure, traumatic head neck and spine injuries, other. 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  See detailed imaging notes above  -I personally reviewed and interpreted EKG which revealed sinus rhythm and was nonischemic  Patient Reassessment and Ultimate Disposition/Management On serial reassessments, patient remained comfortable resting in bed, no acute distress, hemodynamically stable and well-appearing.  Workup was reassuring overall and does not indicate acute injury or stroke.  Also did consider seizure but unlikely given no tongue biting or urinary incontinence and patient has been mostly compliant with taking his seizure medication.  Advised to follow-up with his PCP.  Discussed return precautions.  Vital stable.  Discharged in good condition.  Patient management required discussion with the following services or consulting groups:   None  Complexity of Problems Addressed Acute complicated illness or Injury  Additional Data Reviewed and Analyzed Further history obtained from: Past medical history and medications listed in the EMR and Prior ED visit notes  Patient Encounter Risk Assessment None         Final Clinical Impression(s) / ED Diagnoses Final diagnoses:  Fall, initial encounter    Rx / DC Orders ED Discharge Orders     None         Gareth Eagle, PA-C 09/29/23 1450    Pricilla Loveless, MD 09/29/23 1558

## 2023-09-29 NOTE — Telephone Encounter (Signed)
Per DPR detailed message left on VM with recommendations as reviewed by Dr. Bing Matter.

## 2023-09-29 NOTE — Discharge Instructions (Addendum)
Evaluation today was overall reassuring.  Your CT scans and x-ray did not indicate acute injury or new stroke.  Recommend that you do continue taking your seizure medication as prescribed to you.  If you have another fall, start to seize, have chest pain, arrhythmia, or any other concerning symptom please return emergency department further evaluation.

## 2023-10-01 DIAGNOSIS — F419 Anxiety disorder, unspecified: Secondary | ICD-10-CM | POA: Diagnosis not present

## 2023-10-01 DIAGNOSIS — Z9181 History of falling: Secondary | ICD-10-CM | POA: Diagnosis not present

## 2023-10-01 DIAGNOSIS — Z556 Problems related to health literacy: Secondary | ICD-10-CM | POA: Diagnosis not present

## 2023-10-01 DIAGNOSIS — I69344 Monoplegia of lower limb following cerebral infarction affecting left non-dominant side: Secondary | ICD-10-CM | POA: Diagnosis not present

## 2023-10-01 DIAGNOSIS — G8191 Hemiplegia, unspecified affecting right dominant side: Secondary | ICD-10-CM | POA: Diagnosis not present

## 2023-10-01 DIAGNOSIS — H9190 Unspecified hearing loss, unspecified ear: Secondary | ICD-10-CM | POA: Diagnosis not present

## 2023-10-01 DIAGNOSIS — Z7902 Long term (current) use of antithrombotics/antiplatelets: Secondary | ICD-10-CM | POA: Diagnosis not present

## 2023-10-01 DIAGNOSIS — Z993 Dependence on wheelchair: Secondary | ICD-10-CM | POA: Diagnosis not present

## 2023-10-01 DIAGNOSIS — R1310 Dysphagia, unspecified: Secondary | ICD-10-CM | POA: Diagnosis not present

## 2023-10-01 DIAGNOSIS — I10 Essential (primary) hypertension: Secondary | ICD-10-CM | POA: Diagnosis not present

## 2023-10-01 DIAGNOSIS — F445 Conversion disorder with seizures or convulsions: Secondary | ICD-10-CM | POA: Diagnosis not present

## 2023-10-01 DIAGNOSIS — M6250 Muscle wasting and atrophy, not elsewhere classified, unspecified site: Secondary | ICD-10-CM | POA: Diagnosis not present

## 2023-10-01 DIAGNOSIS — H539 Unspecified visual disturbance: Secondary | ICD-10-CM | POA: Diagnosis not present

## 2023-10-04 DIAGNOSIS — H9190 Unspecified hearing loss, unspecified ear: Secondary | ICD-10-CM | POA: Diagnosis not present

## 2023-10-04 DIAGNOSIS — H539 Unspecified visual disturbance: Secondary | ICD-10-CM | POA: Diagnosis not present

## 2023-10-04 DIAGNOSIS — G8191 Hemiplegia, unspecified affecting right dominant side: Secondary | ICD-10-CM | POA: Diagnosis not present

## 2023-10-04 DIAGNOSIS — Z7902 Long term (current) use of antithrombotics/antiplatelets: Secondary | ICD-10-CM | POA: Diagnosis not present

## 2023-10-04 DIAGNOSIS — Z9181 History of falling: Secondary | ICD-10-CM | POA: Diagnosis not present

## 2023-10-04 DIAGNOSIS — R1310 Dysphagia, unspecified: Secondary | ICD-10-CM | POA: Diagnosis not present

## 2023-10-04 DIAGNOSIS — Z556 Problems related to health literacy: Secondary | ICD-10-CM | POA: Diagnosis not present

## 2023-10-04 DIAGNOSIS — F419 Anxiety disorder, unspecified: Secondary | ICD-10-CM | POA: Diagnosis not present

## 2023-10-04 DIAGNOSIS — I10 Essential (primary) hypertension: Secondary | ICD-10-CM | POA: Diagnosis not present

## 2023-10-04 DIAGNOSIS — Z993 Dependence on wheelchair: Secondary | ICD-10-CM | POA: Diagnosis not present

## 2023-10-04 DIAGNOSIS — F445 Conversion disorder with seizures or convulsions: Secondary | ICD-10-CM | POA: Diagnosis not present

## 2023-10-04 DIAGNOSIS — M6250 Muscle wasting and atrophy, not elsewhere classified, unspecified site: Secondary | ICD-10-CM | POA: Diagnosis not present

## 2023-10-04 DIAGNOSIS — I69344 Monoplegia of lower limb following cerebral infarction affecting left non-dominant side: Secondary | ICD-10-CM | POA: Diagnosis not present

## 2023-10-04 LAB — 10-HYDROXYCARBAZEPINE: Triliptal/MTB(Oxcarbazepin): 12 ug/mL (ref 10–35)

## 2023-10-05 DIAGNOSIS — R1310 Dysphagia, unspecified: Secondary | ICD-10-CM | POA: Diagnosis not present

## 2023-10-05 DIAGNOSIS — F419 Anxiety disorder, unspecified: Secondary | ICD-10-CM | POA: Diagnosis not present

## 2023-10-05 DIAGNOSIS — Z9181 History of falling: Secondary | ICD-10-CM | POA: Diagnosis not present

## 2023-10-05 DIAGNOSIS — I10 Essential (primary) hypertension: Secondary | ICD-10-CM | POA: Diagnosis not present

## 2023-10-05 DIAGNOSIS — G8191 Hemiplegia, unspecified affecting right dominant side: Secondary | ICD-10-CM | POA: Diagnosis not present

## 2023-10-05 DIAGNOSIS — Z993 Dependence on wheelchair: Secondary | ICD-10-CM | POA: Diagnosis not present

## 2023-10-05 DIAGNOSIS — M6250 Muscle wasting and atrophy, not elsewhere classified, unspecified site: Secondary | ICD-10-CM | POA: Diagnosis not present

## 2023-10-05 DIAGNOSIS — H539 Unspecified visual disturbance: Secondary | ICD-10-CM | POA: Diagnosis not present

## 2023-10-05 DIAGNOSIS — Z556 Problems related to health literacy: Secondary | ICD-10-CM | POA: Diagnosis not present

## 2023-10-05 DIAGNOSIS — Z7902 Long term (current) use of antithrombotics/antiplatelets: Secondary | ICD-10-CM | POA: Diagnosis not present

## 2023-10-05 DIAGNOSIS — I69344 Monoplegia of lower limb following cerebral infarction affecting left non-dominant side: Secondary | ICD-10-CM | POA: Diagnosis not present

## 2023-10-05 DIAGNOSIS — H9190 Unspecified hearing loss, unspecified ear: Secondary | ICD-10-CM | POA: Diagnosis not present

## 2023-10-05 DIAGNOSIS — F445 Conversion disorder with seizures or convulsions: Secondary | ICD-10-CM | POA: Diagnosis not present

## 2023-10-06 DIAGNOSIS — G40909 Epilepsy, unspecified, not intractable, without status epilepticus: Secondary | ICD-10-CM | POA: Diagnosis not present

## 2023-10-06 DIAGNOSIS — R296 Repeated falls: Secondary | ICD-10-CM | POA: Diagnosis not present

## 2023-10-06 DIAGNOSIS — I69352 Hemiplegia and hemiparesis following cerebral infarction affecting left dominant side: Secondary | ICD-10-CM | POA: Diagnosis not present

## 2023-10-06 DIAGNOSIS — E7800 Pure hypercholesterolemia, unspecified: Secondary | ICD-10-CM | POA: Diagnosis not present

## 2023-10-07 DIAGNOSIS — I10 Essential (primary) hypertension: Secondary | ICD-10-CM | POA: Diagnosis not present

## 2023-10-07 DIAGNOSIS — I69344 Monoplegia of lower limb following cerebral infarction affecting left non-dominant side: Secondary | ICD-10-CM | POA: Diagnosis not present

## 2023-10-07 DIAGNOSIS — G8191 Hemiplegia, unspecified affecting right dominant side: Secondary | ICD-10-CM | POA: Diagnosis not present

## 2023-10-07 DIAGNOSIS — H9190 Unspecified hearing loss, unspecified ear: Secondary | ICD-10-CM | POA: Diagnosis not present

## 2023-10-07 DIAGNOSIS — M6250 Muscle wasting and atrophy, not elsewhere classified, unspecified site: Secondary | ICD-10-CM | POA: Diagnosis not present

## 2023-10-07 DIAGNOSIS — Z556 Problems related to health literacy: Secondary | ICD-10-CM | POA: Diagnosis not present

## 2023-10-07 DIAGNOSIS — R1310 Dysphagia, unspecified: Secondary | ICD-10-CM | POA: Diagnosis not present

## 2023-10-07 DIAGNOSIS — H539 Unspecified visual disturbance: Secondary | ICD-10-CM | POA: Diagnosis not present

## 2023-10-07 DIAGNOSIS — F419 Anxiety disorder, unspecified: Secondary | ICD-10-CM | POA: Diagnosis not present

## 2023-10-07 DIAGNOSIS — Z7902 Long term (current) use of antithrombotics/antiplatelets: Secondary | ICD-10-CM | POA: Diagnosis not present

## 2023-10-07 DIAGNOSIS — Z9181 History of falling: Secondary | ICD-10-CM | POA: Diagnosis not present

## 2023-10-07 DIAGNOSIS — F445 Conversion disorder with seizures or convulsions: Secondary | ICD-10-CM | POA: Diagnosis not present

## 2023-10-07 DIAGNOSIS — Z993 Dependence on wheelchair: Secondary | ICD-10-CM | POA: Diagnosis not present

## 2023-10-10 DIAGNOSIS — I10 Essential (primary) hypertension: Secondary | ICD-10-CM | POA: Diagnosis not present

## 2023-10-10 DIAGNOSIS — M6250 Muscle wasting and atrophy, not elsewhere classified, unspecified site: Secondary | ICD-10-CM | POA: Diagnosis not present

## 2023-10-10 DIAGNOSIS — R1310 Dysphagia, unspecified: Secondary | ICD-10-CM | POA: Diagnosis not present

## 2023-10-10 DIAGNOSIS — Z9181 History of falling: Secondary | ICD-10-CM | POA: Diagnosis not present

## 2023-10-10 DIAGNOSIS — F445 Conversion disorder with seizures or convulsions: Secondary | ICD-10-CM | POA: Diagnosis not present

## 2023-10-10 DIAGNOSIS — Z993 Dependence on wheelchair: Secondary | ICD-10-CM | POA: Diagnosis not present

## 2023-10-10 DIAGNOSIS — Z7902 Long term (current) use of antithrombotics/antiplatelets: Secondary | ICD-10-CM | POA: Diagnosis not present

## 2023-10-10 DIAGNOSIS — H539 Unspecified visual disturbance: Secondary | ICD-10-CM | POA: Diagnosis not present

## 2023-10-10 DIAGNOSIS — H9190 Unspecified hearing loss, unspecified ear: Secondary | ICD-10-CM | POA: Diagnosis not present

## 2023-10-10 DIAGNOSIS — Z556 Problems related to health literacy: Secondary | ICD-10-CM | POA: Diagnosis not present

## 2023-10-10 DIAGNOSIS — G8191 Hemiplegia, unspecified affecting right dominant side: Secondary | ICD-10-CM | POA: Diagnosis not present

## 2023-10-10 DIAGNOSIS — F419 Anxiety disorder, unspecified: Secondary | ICD-10-CM | POA: Diagnosis not present

## 2023-10-10 DIAGNOSIS — I69344 Monoplegia of lower limb following cerebral infarction affecting left non-dominant side: Secondary | ICD-10-CM | POA: Diagnosis not present

## 2023-10-11 DIAGNOSIS — I10 Essential (primary) hypertension: Secondary | ICD-10-CM | POA: Diagnosis not present

## 2023-10-11 DIAGNOSIS — I69344 Monoplegia of lower limb following cerebral infarction affecting left non-dominant side: Secondary | ICD-10-CM | POA: Diagnosis not present

## 2023-10-11 DIAGNOSIS — Z993 Dependence on wheelchair: Secondary | ICD-10-CM | POA: Diagnosis not present

## 2023-10-11 DIAGNOSIS — F419 Anxiety disorder, unspecified: Secondary | ICD-10-CM | POA: Diagnosis not present

## 2023-10-11 DIAGNOSIS — H539 Unspecified visual disturbance: Secondary | ICD-10-CM | POA: Diagnosis not present

## 2023-10-11 DIAGNOSIS — M6250 Muscle wasting and atrophy, not elsewhere classified, unspecified site: Secondary | ICD-10-CM | POA: Diagnosis not present

## 2023-10-11 DIAGNOSIS — R1310 Dysphagia, unspecified: Secondary | ICD-10-CM | POA: Diagnosis not present

## 2023-10-11 DIAGNOSIS — Z556 Problems related to health literacy: Secondary | ICD-10-CM | POA: Diagnosis not present

## 2023-10-11 DIAGNOSIS — Z7902 Long term (current) use of antithrombotics/antiplatelets: Secondary | ICD-10-CM | POA: Diagnosis not present

## 2023-10-11 DIAGNOSIS — G8191 Hemiplegia, unspecified affecting right dominant side: Secondary | ICD-10-CM | POA: Diagnosis not present

## 2023-10-11 DIAGNOSIS — F445 Conversion disorder with seizures or convulsions: Secondary | ICD-10-CM | POA: Diagnosis not present

## 2023-10-11 DIAGNOSIS — H9190 Unspecified hearing loss, unspecified ear: Secondary | ICD-10-CM | POA: Diagnosis not present

## 2023-10-11 DIAGNOSIS — Z9181 History of falling: Secondary | ICD-10-CM | POA: Diagnosis not present

## 2023-10-12 DIAGNOSIS — F445 Conversion disorder with seizures or convulsions: Secondary | ICD-10-CM | POA: Diagnosis not present

## 2023-10-12 DIAGNOSIS — Z556 Problems related to health literacy: Secondary | ICD-10-CM | POA: Diagnosis not present

## 2023-10-12 DIAGNOSIS — H9190 Unspecified hearing loss, unspecified ear: Secondary | ICD-10-CM | POA: Diagnosis not present

## 2023-10-12 DIAGNOSIS — I10 Essential (primary) hypertension: Secondary | ICD-10-CM | POA: Diagnosis not present

## 2023-10-12 DIAGNOSIS — G8191 Hemiplegia, unspecified affecting right dominant side: Secondary | ICD-10-CM | POA: Diagnosis not present

## 2023-10-12 DIAGNOSIS — Z7902 Long term (current) use of antithrombotics/antiplatelets: Secondary | ICD-10-CM | POA: Diagnosis not present

## 2023-10-12 DIAGNOSIS — M6250 Muscle wasting and atrophy, not elsewhere classified, unspecified site: Secondary | ICD-10-CM | POA: Diagnosis not present

## 2023-10-12 DIAGNOSIS — F419 Anxiety disorder, unspecified: Secondary | ICD-10-CM | POA: Diagnosis not present

## 2023-10-12 DIAGNOSIS — Z9181 History of falling: Secondary | ICD-10-CM | POA: Diagnosis not present

## 2023-10-12 DIAGNOSIS — R1310 Dysphagia, unspecified: Secondary | ICD-10-CM | POA: Diagnosis not present

## 2023-10-12 DIAGNOSIS — Z993 Dependence on wheelchair: Secondary | ICD-10-CM | POA: Diagnosis not present

## 2023-10-12 DIAGNOSIS — H539 Unspecified visual disturbance: Secondary | ICD-10-CM | POA: Diagnosis not present

## 2023-10-12 DIAGNOSIS — I69344 Monoplegia of lower limb following cerebral infarction affecting left non-dominant side: Secondary | ICD-10-CM | POA: Diagnosis not present

## 2023-10-13 DIAGNOSIS — Z7902 Long term (current) use of antithrombotics/antiplatelets: Secondary | ICD-10-CM | POA: Diagnosis not present

## 2023-10-13 DIAGNOSIS — M6250 Muscle wasting and atrophy, not elsewhere classified, unspecified site: Secondary | ICD-10-CM | POA: Diagnosis not present

## 2023-10-13 DIAGNOSIS — Z9181 History of falling: Secondary | ICD-10-CM | POA: Diagnosis not present

## 2023-10-13 DIAGNOSIS — I10 Essential (primary) hypertension: Secondary | ICD-10-CM | POA: Diagnosis not present

## 2023-10-13 DIAGNOSIS — G8191 Hemiplegia, unspecified affecting right dominant side: Secondary | ICD-10-CM | POA: Diagnosis not present

## 2023-10-13 DIAGNOSIS — Z556 Problems related to health literacy: Secondary | ICD-10-CM | POA: Diagnosis not present

## 2023-10-13 DIAGNOSIS — H539 Unspecified visual disturbance: Secondary | ICD-10-CM | POA: Diagnosis not present

## 2023-10-13 DIAGNOSIS — I69344 Monoplegia of lower limb following cerebral infarction affecting left non-dominant side: Secondary | ICD-10-CM | POA: Diagnosis not present

## 2023-10-13 DIAGNOSIS — F419 Anxiety disorder, unspecified: Secondary | ICD-10-CM | POA: Diagnosis not present

## 2023-10-13 DIAGNOSIS — Z993 Dependence on wheelchair: Secondary | ICD-10-CM | POA: Diagnosis not present

## 2023-10-13 DIAGNOSIS — F445 Conversion disorder with seizures or convulsions: Secondary | ICD-10-CM | POA: Diagnosis not present

## 2023-10-13 DIAGNOSIS — R1310 Dysphagia, unspecified: Secondary | ICD-10-CM | POA: Diagnosis not present

## 2023-10-13 DIAGNOSIS — H9190 Unspecified hearing loss, unspecified ear: Secondary | ICD-10-CM | POA: Diagnosis not present

## 2023-10-18 DIAGNOSIS — Z993 Dependence on wheelchair: Secondary | ICD-10-CM | POA: Diagnosis not present

## 2023-10-18 DIAGNOSIS — I10 Essential (primary) hypertension: Secondary | ICD-10-CM | POA: Diagnosis not present

## 2023-10-18 DIAGNOSIS — Z7902 Long term (current) use of antithrombotics/antiplatelets: Secondary | ICD-10-CM | POA: Diagnosis not present

## 2023-10-18 DIAGNOSIS — M6250 Muscle wasting and atrophy, not elsewhere classified, unspecified site: Secondary | ICD-10-CM | POA: Diagnosis not present

## 2023-10-18 DIAGNOSIS — H539 Unspecified visual disturbance: Secondary | ICD-10-CM | POA: Diagnosis not present

## 2023-10-18 DIAGNOSIS — Z9181 History of falling: Secondary | ICD-10-CM | POA: Diagnosis not present

## 2023-10-18 DIAGNOSIS — F445 Conversion disorder with seizures or convulsions: Secondary | ICD-10-CM | POA: Diagnosis not present

## 2023-10-18 DIAGNOSIS — Z556 Problems related to health literacy: Secondary | ICD-10-CM | POA: Diagnosis not present

## 2023-10-18 DIAGNOSIS — R1310 Dysphagia, unspecified: Secondary | ICD-10-CM | POA: Diagnosis not present

## 2023-10-18 DIAGNOSIS — G8191 Hemiplegia, unspecified affecting right dominant side: Secondary | ICD-10-CM | POA: Diagnosis not present

## 2023-10-18 DIAGNOSIS — H9190 Unspecified hearing loss, unspecified ear: Secondary | ICD-10-CM | POA: Diagnosis not present

## 2023-10-18 DIAGNOSIS — I69344 Monoplegia of lower limb following cerebral infarction affecting left non-dominant side: Secondary | ICD-10-CM | POA: Diagnosis not present

## 2023-10-18 DIAGNOSIS — F419 Anxiety disorder, unspecified: Secondary | ICD-10-CM | POA: Diagnosis not present

## 2023-10-19 DIAGNOSIS — H539 Unspecified visual disturbance: Secondary | ICD-10-CM | POA: Diagnosis not present

## 2023-10-19 DIAGNOSIS — F445 Conversion disorder with seizures or convulsions: Secondary | ICD-10-CM | POA: Diagnosis not present

## 2023-10-19 DIAGNOSIS — I69344 Monoplegia of lower limb following cerebral infarction affecting left non-dominant side: Secondary | ICD-10-CM | POA: Diagnosis not present

## 2023-10-19 DIAGNOSIS — I10 Essential (primary) hypertension: Secondary | ICD-10-CM | POA: Diagnosis not present

## 2023-10-19 DIAGNOSIS — Z556 Problems related to health literacy: Secondary | ICD-10-CM | POA: Diagnosis not present

## 2023-10-19 DIAGNOSIS — Z7902 Long term (current) use of antithrombotics/antiplatelets: Secondary | ICD-10-CM | POA: Diagnosis not present

## 2023-10-19 DIAGNOSIS — Z9181 History of falling: Secondary | ICD-10-CM | POA: Diagnosis not present

## 2023-10-19 DIAGNOSIS — G8191 Hemiplegia, unspecified affecting right dominant side: Secondary | ICD-10-CM | POA: Diagnosis not present

## 2023-10-19 DIAGNOSIS — M6250 Muscle wasting and atrophy, not elsewhere classified, unspecified site: Secondary | ICD-10-CM | POA: Diagnosis not present

## 2023-10-19 DIAGNOSIS — Z993 Dependence on wheelchair: Secondary | ICD-10-CM | POA: Diagnosis not present

## 2023-10-19 DIAGNOSIS — H9190 Unspecified hearing loss, unspecified ear: Secondary | ICD-10-CM | POA: Diagnosis not present

## 2023-10-19 DIAGNOSIS — R1310 Dysphagia, unspecified: Secondary | ICD-10-CM | POA: Diagnosis not present

## 2023-10-19 DIAGNOSIS — F419 Anxiety disorder, unspecified: Secondary | ICD-10-CM | POA: Diagnosis not present

## 2023-10-20 DIAGNOSIS — I69344 Monoplegia of lower limb following cerebral infarction affecting left non-dominant side: Secondary | ICD-10-CM | POA: Diagnosis not present

## 2023-10-20 DIAGNOSIS — F419 Anxiety disorder, unspecified: Secondary | ICD-10-CM | POA: Diagnosis not present

## 2023-10-20 DIAGNOSIS — G8191 Hemiplegia, unspecified affecting right dominant side: Secondary | ICD-10-CM | POA: Diagnosis not present

## 2023-10-20 DIAGNOSIS — H539 Unspecified visual disturbance: Secondary | ICD-10-CM | POA: Diagnosis not present

## 2023-10-20 DIAGNOSIS — H9190 Unspecified hearing loss, unspecified ear: Secondary | ICD-10-CM | POA: Diagnosis not present

## 2023-10-20 DIAGNOSIS — R1310 Dysphagia, unspecified: Secondary | ICD-10-CM | POA: Diagnosis not present

## 2023-10-20 DIAGNOSIS — F445 Conversion disorder with seizures or convulsions: Secondary | ICD-10-CM | POA: Diagnosis not present

## 2023-10-20 DIAGNOSIS — Z9181 History of falling: Secondary | ICD-10-CM | POA: Diagnosis not present

## 2023-10-20 DIAGNOSIS — Z7902 Long term (current) use of antithrombotics/antiplatelets: Secondary | ICD-10-CM | POA: Diagnosis not present

## 2023-10-20 DIAGNOSIS — M6250 Muscle wasting and atrophy, not elsewhere classified, unspecified site: Secondary | ICD-10-CM | POA: Diagnosis not present

## 2023-10-20 DIAGNOSIS — I10 Essential (primary) hypertension: Secondary | ICD-10-CM | POA: Diagnosis not present

## 2023-10-20 DIAGNOSIS — Z993 Dependence on wheelchair: Secondary | ICD-10-CM | POA: Diagnosis not present

## 2023-10-20 DIAGNOSIS — Z556 Problems related to health literacy: Secondary | ICD-10-CM | POA: Diagnosis not present

## 2023-10-21 DIAGNOSIS — Z7902 Long term (current) use of antithrombotics/antiplatelets: Secondary | ICD-10-CM | POA: Diagnosis not present

## 2023-10-21 DIAGNOSIS — I10 Essential (primary) hypertension: Secondary | ICD-10-CM | POA: Diagnosis not present

## 2023-10-21 DIAGNOSIS — Z9181 History of falling: Secondary | ICD-10-CM | POA: Diagnosis not present

## 2023-10-21 DIAGNOSIS — H9190 Unspecified hearing loss, unspecified ear: Secondary | ICD-10-CM | POA: Diagnosis not present

## 2023-10-21 DIAGNOSIS — H539 Unspecified visual disturbance: Secondary | ICD-10-CM | POA: Diagnosis not present

## 2023-10-21 DIAGNOSIS — F419 Anxiety disorder, unspecified: Secondary | ICD-10-CM | POA: Diagnosis not present

## 2023-10-21 DIAGNOSIS — Z993 Dependence on wheelchair: Secondary | ICD-10-CM | POA: Diagnosis not present

## 2023-10-21 DIAGNOSIS — I69344 Monoplegia of lower limb following cerebral infarction affecting left non-dominant side: Secondary | ICD-10-CM | POA: Diagnosis not present

## 2023-10-21 DIAGNOSIS — R1310 Dysphagia, unspecified: Secondary | ICD-10-CM | POA: Diagnosis not present

## 2023-10-21 DIAGNOSIS — G8191 Hemiplegia, unspecified affecting right dominant side: Secondary | ICD-10-CM | POA: Diagnosis not present

## 2023-10-21 DIAGNOSIS — Z556 Problems related to health literacy: Secondary | ICD-10-CM | POA: Diagnosis not present

## 2023-10-21 DIAGNOSIS — F445 Conversion disorder with seizures or convulsions: Secondary | ICD-10-CM | POA: Diagnosis not present

## 2023-10-21 DIAGNOSIS — M6250 Muscle wasting and atrophy, not elsewhere classified, unspecified site: Secondary | ICD-10-CM | POA: Diagnosis not present

## 2023-10-23 DIAGNOSIS — F445 Conversion disorder with seizures or convulsions: Secondary | ICD-10-CM | POA: Diagnosis not present

## 2023-10-23 DIAGNOSIS — I6330 Cerebral infarction due to thrombosis of unspecified cerebral artery: Secondary | ICD-10-CM | POA: Diagnosis not present

## 2023-10-23 DIAGNOSIS — I6381 Other cerebral infarction due to occlusion or stenosis of small artery: Secondary | ICD-10-CM | POA: Diagnosis not present

## 2023-10-23 DIAGNOSIS — I69351 Hemiplegia and hemiparesis following cerebral infarction affecting right dominant side: Secondary | ICD-10-CM | POA: Diagnosis not present

## 2023-10-25 DIAGNOSIS — I69344 Monoplegia of lower limb following cerebral infarction affecting left non-dominant side: Secondary | ICD-10-CM | POA: Diagnosis not present

## 2023-10-25 DIAGNOSIS — F419 Anxiety disorder, unspecified: Secondary | ICD-10-CM | POA: Diagnosis not present

## 2023-10-25 DIAGNOSIS — Z993 Dependence on wheelchair: Secondary | ICD-10-CM | POA: Diagnosis not present

## 2023-10-25 DIAGNOSIS — H9190 Unspecified hearing loss, unspecified ear: Secondary | ICD-10-CM | POA: Diagnosis not present

## 2023-10-25 DIAGNOSIS — Z556 Problems related to health literacy: Secondary | ICD-10-CM | POA: Diagnosis not present

## 2023-10-25 DIAGNOSIS — H539 Unspecified visual disturbance: Secondary | ICD-10-CM | POA: Diagnosis not present

## 2023-10-25 DIAGNOSIS — F445 Conversion disorder with seizures or convulsions: Secondary | ICD-10-CM | POA: Diagnosis not present

## 2023-10-25 DIAGNOSIS — M6250 Muscle wasting and atrophy, not elsewhere classified, unspecified site: Secondary | ICD-10-CM | POA: Diagnosis not present

## 2023-10-25 DIAGNOSIS — Z7902 Long term (current) use of antithrombotics/antiplatelets: Secondary | ICD-10-CM | POA: Diagnosis not present

## 2023-10-25 DIAGNOSIS — G8191 Hemiplegia, unspecified affecting right dominant side: Secondary | ICD-10-CM | POA: Diagnosis not present

## 2023-10-25 DIAGNOSIS — Z9181 History of falling: Secondary | ICD-10-CM | POA: Diagnosis not present

## 2023-10-25 DIAGNOSIS — I10 Essential (primary) hypertension: Secondary | ICD-10-CM | POA: Diagnosis not present

## 2023-10-25 DIAGNOSIS — R1310 Dysphagia, unspecified: Secondary | ICD-10-CM | POA: Diagnosis not present

## 2023-10-26 DIAGNOSIS — F411 Generalized anxiety disorder: Secondary | ICD-10-CM | POA: Diagnosis not present

## 2023-10-26 DIAGNOSIS — F4010 Social phobia, unspecified: Secondary | ICD-10-CM | POA: Diagnosis not present

## 2023-10-26 DIAGNOSIS — F39 Unspecified mood [affective] disorder: Secondary | ICD-10-CM | POA: Diagnosis not present

## 2023-10-27 DIAGNOSIS — Z7902 Long term (current) use of antithrombotics/antiplatelets: Secondary | ICD-10-CM | POA: Diagnosis not present

## 2023-10-27 DIAGNOSIS — H539 Unspecified visual disturbance: Secondary | ICD-10-CM | POA: Diagnosis not present

## 2023-10-27 DIAGNOSIS — F445 Conversion disorder with seizures or convulsions: Secondary | ICD-10-CM | POA: Diagnosis not present

## 2023-10-27 DIAGNOSIS — I69344 Monoplegia of lower limb following cerebral infarction affecting left non-dominant side: Secondary | ICD-10-CM | POA: Diagnosis not present

## 2023-10-27 DIAGNOSIS — M6250 Muscle wasting and atrophy, not elsewhere classified, unspecified site: Secondary | ICD-10-CM | POA: Diagnosis not present

## 2023-10-27 DIAGNOSIS — G8191 Hemiplegia, unspecified affecting right dominant side: Secondary | ICD-10-CM | POA: Diagnosis not present

## 2023-10-27 DIAGNOSIS — R1310 Dysphagia, unspecified: Secondary | ICD-10-CM | POA: Diagnosis not present

## 2023-10-27 DIAGNOSIS — Z9181 History of falling: Secondary | ICD-10-CM | POA: Diagnosis not present

## 2023-10-27 DIAGNOSIS — Z993 Dependence on wheelchair: Secondary | ICD-10-CM | POA: Diagnosis not present

## 2023-10-27 DIAGNOSIS — I10 Essential (primary) hypertension: Secondary | ICD-10-CM | POA: Diagnosis not present

## 2023-10-27 DIAGNOSIS — F419 Anxiety disorder, unspecified: Secondary | ICD-10-CM | POA: Diagnosis not present

## 2023-10-27 DIAGNOSIS — Z556 Problems related to health literacy: Secondary | ICD-10-CM | POA: Diagnosis not present

## 2023-10-27 DIAGNOSIS — H9190 Unspecified hearing loss, unspecified ear: Secondary | ICD-10-CM | POA: Diagnosis not present

## 2023-11-01 DIAGNOSIS — H539 Unspecified visual disturbance: Secondary | ICD-10-CM | POA: Diagnosis not present

## 2023-11-01 DIAGNOSIS — I69344 Monoplegia of lower limb following cerebral infarction affecting left non-dominant side: Secondary | ICD-10-CM | POA: Diagnosis not present

## 2023-11-01 DIAGNOSIS — Z9181 History of falling: Secondary | ICD-10-CM | POA: Diagnosis not present

## 2023-11-01 DIAGNOSIS — I10 Essential (primary) hypertension: Secondary | ICD-10-CM | POA: Diagnosis not present

## 2023-11-01 DIAGNOSIS — F445 Conversion disorder with seizures or convulsions: Secondary | ICD-10-CM | POA: Diagnosis not present

## 2023-11-01 DIAGNOSIS — Z556 Problems related to health literacy: Secondary | ICD-10-CM | POA: Diagnosis not present

## 2023-11-01 DIAGNOSIS — R1310 Dysphagia, unspecified: Secondary | ICD-10-CM | POA: Diagnosis not present

## 2023-11-01 DIAGNOSIS — F419 Anxiety disorder, unspecified: Secondary | ICD-10-CM | POA: Diagnosis not present

## 2023-11-01 DIAGNOSIS — Z7902 Long term (current) use of antithrombotics/antiplatelets: Secondary | ICD-10-CM | POA: Diagnosis not present

## 2023-11-01 DIAGNOSIS — G8191 Hemiplegia, unspecified affecting right dominant side: Secondary | ICD-10-CM | POA: Diagnosis not present

## 2023-11-01 DIAGNOSIS — M6250 Muscle wasting and atrophy, not elsewhere classified, unspecified site: Secondary | ICD-10-CM | POA: Diagnosis not present

## 2023-11-01 DIAGNOSIS — H9190 Unspecified hearing loss, unspecified ear: Secondary | ICD-10-CM | POA: Diagnosis not present

## 2023-11-01 DIAGNOSIS — Z993 Dependence on wheelchair: Secondary | ICD-10-CM | POA: Diagnosis not present

## 2023-11-02 DIAGNOSIS — F419 Anxiety disorder, unspecified: Secondary | ICD-10-CM | POA: Diagnosis not present

## 2023-11-02 DIAGNOSIS — G8191 Hemiplegia, unspecified affecting right dominant side: Secondary | ICD-10-CM | POA: Diagnosis not present

## 2023-11-02 DIAGNOSIS — I10 Essential (primary) hypertension: Secondary | ICD-10-CM | POA: Diagnosis not present

## 2023-11-02 DIAGNOSIS — Z993 Dependence on wheelchair: Secondary | ICD-10-CM | POA: Diagnosis not present

## 2023-11-02 DIAGNOSIS — F445 Conversion disorder with seizures or convulsions: Secondary | ICD-10-CM | POA: Diagnosis not present

## 2023-11-02 DIAGNOSIS — H9190 Unspecified hearing loss, unspecified ear: Secondary | ICD-10-CM | POA: Diagnosis not present

## 2023-11-02 DIAGNOSIS — H539 Unspecified visual disturbance: Secondary | ICD-10-CM | POA: Diagnosis not present

## 2023-11-02 DIAGNOSIS — Z9181 History of falling: Secondary | ICD-10-CM | POA: Diagnosis not present

## 2023-11-02 DIAGNOSIS — Z556 Problems related to health literacy: Secondary | ICD-10-CM | POA: Diagnosis not present

## 2023-11-02 DIAGNOSIS — Z7902 Long term (current) use of antithrombotics/antiplatelets: Secondary | ICD-10-CM | POA: Diagnosis not present

## 2023-11-02 DIAGNOSIS — I69344 Monoplegia of lower limb following cerebral infarction affecting left non-dominant side: Secondary | ICD-10-CM | POA: Diagnosis not present

## 2023-11-02 DIAGNOSIS — R1310 Dysphagia, unspecified: Secondary | ICD-10-CM | POA: Diagnosis not present

## 2023-11-02 DIAGNOSIS — M6250 Muscle wasting and atrophy, not elsewhere classified, unspecified site: Secondary | ICD-10-CM | POA: Diagnosis not present

## 2023-11-03 DIAGNOSIS — I69352 Hemiplegia and hemiparesis following cerebral infarction affecting left dominant side: Secondary | ICD-10-CM | POA: Diagnosis not present

## 2023-11-03 DIAGNOSIS — Z85841 Personal history of malignant neoplasm of brain: Secondary | ICD-10-CM | POA: Diagnosis not present

## 2023-11-04 ENCOUNTER — Other Ambulatory Visit: Payer: Self-pay | Admitting: Cardiology

## 2023-11-04 NOTE — Telephone Encounter (Signed)
 Prescription sent to pharmacy.

## 2023-11-08 ENCOUNTER — Telehealth: Payer: Self-pay | Admitting: Neurology

## 2023-11-08 ENCOUNTER — Telehealth: Payer: Self-pay | Admitting: Cardiology

## 2023-11-08 DIAGNOSIS — G8191 Hemiplegia, unspecified affecting right dominant side: Secondary | ICD-10-CM | POA: Diagnosis not present

## 2023-11-08 DIAGNOSIS — I10 Essential (primary) hypertension: Secondary | ICD-10-CM | POA: Diagnosis not present

## 2023-11-08 DIAGNOSIS — Z7902 Long term (current) use of antithrombotics/antiplatelets: Secondary | ICD-10-CM | POA: Diagnosis not present

## 2023-11-08 DIAGNOSIS — F39 Unspecified mood [affective] disorder: Secondary | ICD-10-CM | POA: Diagnosis not present

## 2023-11-08 DIAGNOSIS — Z556 Problems related to health literacy: Secondary | ICD-10-CM | POA: Diagnosis not present

## 2023-11-08 DIAGNOSIS — R1310 Dysphagia, unspecified: Secondary | ICD-10-CM | POA: Diagnosis not present

## 2023-11-08 DIAGNOSIS — F419 Anxiety disorder, unspecified: Secondary | ICD-10-CM | POA: Diagnosis not present

## 2023-11-08 DIAGNOSIS — Z9181 History of falling: Secondary | ICD-10-CM | POA: Diagnosis not present

## 2023-11-08 DIAGNOSIS — F411 Generalized anxiety disorder: Secondary | ICD-10-CM | POA: Diagnosis not present

## 2023-11-08 DIAGNOSIS — Z993 Dependence on wheelchair: Secondary | ICD-10-CM | POA: Diagnosis not present

## 2023-11-08 DIAGNOSIS — I69344 Monoplegia of lower limb following cerebral infarction affecting left non-dominant side: Secondary | ICD-10-CM | POA: Diagnosis not present

## 2023-11-08 DIAGNOSIS — F445 Conversion disorder with seizures or convulsions: Secondary | ICD-10-CM | POA: Diagnosis not present

## 2023-11-08 DIAGNOSIS — H539 Unspecified visual disturbance: Secondary | ICD-10-CM | POA: Diagnosis not present

## 2023-11-08 DIAGNOSIS — M6250 Muscle wasting and atrophy, not elsewhere classified, unspecified site: Secondary | ICD-10-CM | POA: Diagnosis not present

## 2023-11-08 DIAGNOSIS — F4010 Social phobia, unspecified: Secondary | ICD-10-CM | POA: Diagnosis not present

## 2023-11-08 DIAGNOSIS — H9190 Unspecified hearing loss, unspecified ear: Secondary | ICD-10-CM | POA: Diagnosis not present

## 2023-11-08 NOTE — Telephone Encounter (Signed)
Pt rescheduled due to transportation issues

## 2023-11-08 NOTE — Telephone Encounter (Signed)
Pt asking for a letter to [employer] for a power chair. Send to: [address] [place], [place] [address] Phone: [phone]

## 2023-11-08 NOTE — Telephone Encounter (Signed)
New Message:      Patient says he needs a letter from Dr Bing Matter for disabilty stating the he needs a power wheelchair please.

## 2023-11-08 NOTE — Telephone Encounter (Signed)
Would recommend discussing with primary care. I have not seen the patient since July 2024 at the time was not using wheelchair. Thanks

## 2023-11-08 NOTE — Telephone Encounter (Signed)
New Message:     Patient says he needs a letter from Dr Bing Matter stating that he needs a power wheelchair> He says this is for Disability.

## 2023-11-08 NOTE — Telephone Encounter (Signed)
LVM for pt. Advised that he would need to get his PCP to fill out paperwork for disability and powerchair.

## 2023-11-08 NOTE — Telephone Encounter (Signed)
 Lmtrc 1st attempt

## 2023-11-09 ENCOUNTER — Ambulatory Visit: Payer: Medicare Other | Admitting: Neurology

## 2023-11-09 DIAGNOSIS — I10 Essential (primary) hypertension: Secondary | ICD-10-CM | POA: Diagnosis not present

## 2023-11-09 DIAGNOSIS — F445 Conversion disorder with seizures or convulsions: Secondary | ICD-10-CM | POA: Diagnosis not present

## 2023-11-09 DIAGNOSIS — H9190 Unspecified hearing loss, unspecified ear: Secondary | ICD-10-CM | POA: Diagnosis not present

## 2023-11-09 DIAGNOSIS — I69344 Monoplegia of lower limb following cerebral infarction affecting left non-dominant side: Secondary | ICD-10-CM | POA: Diagnosis not present

## 2023-11-09 DIAGNOSIS — Z993 Dependence on wheelchair: Secondary | ICD-10-CM | POA: Diagnosis not present

## 2023-11-09 DIAGNOSIS — Z556 Problems related to health literacy: Secondary | ICD-10-CM | POA: Diagnosis not present

## 2023-11-09 DIAGNOSIS — F419 Anxiety disorder, unspecified: Secondary | ICD-10-CM | POA: Diagnosis not present

## 2023-11-09 DIAGNOSIS — Z9181 History of falling: Secondary | ICD-10-CM | POA: Diagnosis not present

## 2023-11-09 DIAGNOSIS — Z7902 Long term (current) use of antithrombotics/antiplatelets: Secondary | ICD-10-CM | POA: Diagnosis not present

## 2023-11-09 DIAGNOSIS — M6250 Muscle wasting and atrophy, not elsewhere classified, unspecified site: Secondary | ICD-10-CM | POA: Diagnosis not present

## 2023-11-09 DIAGNOSIS — G8191 Hemiplegia, unspecified affecting right dominant side: Secondary | ICD-10-CM | POA: Diagnosis not present

## 2023-11-09 DIAGNOSIS — H539 Unspecified visual disturbance: Secondary | ICD-10-CM | POA: Diagnosis not present

## 2023-11-09 DIAGNOSIS — R1310 Dysphagia, unspecified: Secondary | ICD-10-CM | POA: Diagnosis not present

## 2023-11-09 NOTE — Telephone Encounter (Signed)
 Phone room: Please try to get pt scheduled as hospital follow up. He will need to be seen prior to ordering power chair.  Thanks,  Production assistant, radio

## 2023-11-09 NOTE — Telephone Encounter (Signed)
 Please schedule for revisit.

## 2023-11-09 NOTE — Telephone Encounter (Signed)
 [name] Love w/[facility] stated pcp wouldn't give him electric wheel chair unless he had no arms. They are trying to encourage him to stay active but its causing him pain. They would like sarah slack to reconsider ordering power chair

## 2023-11-10 ENCOUNTER — Ambulatory Visit: Payer: Medicare Other | Admitting: Neurology

## 2023-11-10 DIAGNOSIS — H9190 Unspecified hearing loss, unspecified ear: Secondary | ICD-10-CM | POA: Diagnosis not present

## 2023-11-10 DIAGNOSIS — Z556 Problems related to health literacy: Secondary | ICD-10-CM | POA: Diagnosis not present

## 2023-11-10 DIAGNOSIS — F419 Anxiety disorder, unspecified: Secondary | ICD-10-CM | POA: Diagnosis not present

## 2023-11-10 DIAGNOSIS — H539 Unspecified visual disturbance: Secondary | ICD-10-CM | POA: Diagnosis not present

## 2023-11-10 DIAGNOSIS — I69344 Monoplegia of lower limb following cerebral infarction affecting left non-dominant side: Secondary | ICD-10-CM | POA: Diagnosis not present

## 2023-11-10 DIAGNOSIS — G8191 Hemiplegia, unspecified affecting right dominant side: Secondary | ICD-10-CM | POA: Diagnosis not present

## 2023-11-10 DIAGNOSIS — Z9181 History of falling: Secondary | ICD-10-CM | POA: Diagnosis not present

## 2023-11-10 DIAGNOSIS — Z7902 Long term (current) use of antithrombotics/antiplatelets: Secondary | ICD-10-CM | POA: Diagnosis not present

## 2023-11-10 DIAGNOSIS — I10 Essential (primary) hypertension: Secondary | ICD-10-CM | POA: Diagnosis not present

## 2023-11-10 DIAGNOSIS — M6250 Muscle wasting and atrophy, not elsewhere classified, unspecified site: Secondary | ICD-10-CM | POA: Diagnosis not present

## 2023-11-10 DIAGNOSIS — R1310 Dysphagia, unspecified: Secondary | ICD-10-CM | POA: Diagnosis not present

## 2023-11-10 DIAGNOSIS — Z993 Dependence on wheelchair: Secondary | ICD-10-CM | POA: Diagnosis not present

## 2023-11-10 DIAGNOSIS — F445 Conversion disorder with seizures or convulsions: Secondary | ICD-10-CM | POA: Diagnosis not present

## 2023-11-10 NOTE — Telephone Encounter (Signed)
 Contacted the patient to schedule appointment. Patient said, already have gotten a power chair do not need to schedule an appointment.

## 2023-11-11 ENCOUNTER — Other Ambulatory Visit: Payer: Self-pay | Admitting: Cardiology

## 2023-11-14 DIAGNOSIS — F419 Anxiety disorder, unspecified: Secondary | ICD-10-CM | POA: Diagnosis not present

## 2023-11-14 DIAGNOSIS — F445 Conversion disorder with seizures or convulsions: Secondary | ICD-10-CM | POA: Diagnosis not present

## 2023-11-14 DIAGNOSIS — Z993 Dependence on wheelchair: Secondary | ICD-10-CM | POA: Diagnosis not present

## 2023-11-14 DIAGNOSIS — H539 Unspecified visual disturbance: Secondary | ICD-10-CM | POA: Diagnosis not present

## 2023-11-14 DIAGNOSIS — I69344 Monoplegia of lower limb following cerebral infarction affecting left non-dominant side: Secondary | ICD-10-CM | POA: Diagnosis not present

## 2023-11-14 DIAGNOSIS — R1310 Dysphagia, unspecified: Secondary | ICD-10-CM | POA: Diagnosis not present

## 2023-11-14 DIAGNOSIS — G8191 Hemiplegia, unspecified affecting right dominant side: Secondary | ICD-10-CM | POA: Diagnosis not present

## 2023-11-14 DIAGNOSIS — M6250 Muscle wasting and atrophy, not elsewhere classified, unspecified site: Secondary | ICD-10-CM | POA: Diagnosis not present

## 2023-11-14 DIAGNOSIS — I10 Essential (primary) hypertension: Secondary | ICD-10-CM | POA: Diagnosis not present

## 2023-11-14 DIAGNOSIS — H9190 Unspecified hearing loss, unspecified ear: Secondary | ICD-10-CM | POA: Diagnosis not present

## 2023-11-14 DIAGNOSIS — Z9181 History of falling: Secondary | ICD-10-CM | POA: Diagnosis not present

## 2023-11-14 DIAGNOSIS — Z556 Problems related to health literacy: Secondary | ICD-10-CM | POA: Diagnosis not present

## 2023-11-14 DIAGNOSIS — Z7902 Long term (current) use of antithrombotics/antiplatelets: Secondary | ICD-10-CM | POA: Diagnosis not present

## 2023-11-15 DIAGNOSIS — H539 Unspecified visual disturbance: Secondary | ICD-10-CM | POA: Diagnosis not present

## 2023-11-15 DIAGNOSIS — G8191 Hemiplegia, unspecified affecting right dominant side: Secondary | ICD-10-CM | POA: Diagnosis not present

## 2023-11-15 DIAGNOSIS — I69344 Monoplegia of lower limb following cerebral infarction affecting left non-dominant side: Secondary | ICD-10-CM | POA: Diagnosis not present

## 2023-11-15 DIAGNOSIS — Z556 Problems related to health literacy: Secondary | ICD-10-CM | POA: Diagnosis not present

## 2023-11-15 DIAGNOSIS — R1310 Dysphagia, unspecified: Secondary | ICD-10-CM | POA: Diagnosis not present

## 2023-11-15 DIAGNOSIS — F419 Anxiety disorder, unspecified: Secondary | ICD-10-CM | POA: Diagnosis not present

## 2023-11-15 DIAGNOSIS — F445 Conversion disorder with seizures or convulsions: Secondary | ICD-10-CM | POA: Diagnosis not present

## 2023-11-15 DIAGNOSIS — M6250 Muscle wasting and atrophy, not elsewhere classified, unspecified site: Secondary | ICD-10-CM | POA: Diagnosis not present

## 2023-11-15 DIAGNOSIS — I10 Essential (primary) hypertension: Secondary | ICD-10-CM | POA: Diagnosis not present

## 2023-11-15 DIAGNOSIS — Z9181 History of falling: Secondary | ICD-10-CM | POA: Diagnosis not present

## 2023-11-15 DIAGNOSIS — H9190 Unspecified hearing loss, unspecified ear: Secondary | ICD-10-CM | POA: Diagnosis not present

## 2023-11-15 DIAGNOSIS — Z993 Dependence on wheelchair: Secondary | ICD-10-CM | POA: Diagnosis not present

## 2023-11-15 DIAGNOSIS — Z7902 Long term (current) use of antithrombotics/antiplatelets: Secondary | ICD-10-CM | POA: Diagnosis not present

## 2023-11-16 DIAGNOSIS — Z7902 Long term (current) use of antithrombotics/antiplatelets: Secondary | ICD-10-CM | POA: Diagnosis not present

## 2023-11-16 DIAGNOSIS — F419 Anxiety disorder, unspecified: Secondary | ICD-10-CM | POA: Diagnosis not present

## 2023-11-16 DIAGNOSIS — Z993 Dependence on wheelchair: Secondary | ICD-10-CM | POA: Diagnosis not present

## 2023-11-16 DIAGNOSIS — Z556 Problems related to health literacy: Secondary | ICD-10-CM | POA: Diagnosis not present

## 2023-11-16 DIAGNOSIS — R1310 Dysphagia, unspecified: Secondary | ICD-10-CM | POA: Diagnosis not present

## 2023-11-16 DIAGNOSIS — I69344 Monoplegia of lower limb following cerebral infarction affecting left non-dominant side: Secondary | ICD-10-CM | POA: Diagnosis not present

## 2023-11-16 DIAGNOSIS — Z9181 History of falling: Secondary | ICD-10-CM | POA: Diagnosis not present

## 2023-11-16 DIAGNOSIS — H539 Unspecified visual disturbance: Secondary | ICD-10-CM | POA: Diagnosis not present

## 2023-11-16 DIAGNOSIS — M6250 Muscle wasting and atrophy, not elsewhere classified, unspecified site: Secondary | ICD-10-CM | POA: Diagnosis not present

## 2023-11-16 DIAGNOSIS — I10 Essential (primary) hypertension: Secondary | ICD-10-CM | POA: Diagnosis not present

## 2023-11-16 DIAGNOSIS — F445 Conversion disorder with seizures or convulsions: Secondary | ICD-10-CM | POA: Diagnosis not present

## 2023-11-16 DIAGNOSIS — H9190 Unspecified hearing loss, unspecified ear: Secondary | ICD-10-CM | POA: Diagnosis not present

## 2023-11-16 DIAGNOSIS — G8191 Hemiplegia, unspecified affecting right dominant side: Secondary | ICD-10-CM | POA: Diagnosis not present

## 2023-11-23 DIAGNOSIS — I69351 Hemiplegia and hemiparesis following cerebral infarction affecting right dominant side: Secondary | ICD-10-CM | POA: Diagnosis not present

## 2023-11-23 DIAGNOSIS — I6381 Other cerebral infarction due to occlusion or stenosis of small artery: Secondary | ICD-10-CM | POA: Diagnosis not present

## 2023-11-23 DIAGNOSIS — I6330 Cerebral infarction due to thrombosis of unspecified cerebral artery: Secondary | ICD-10-CM | POA: Diagnosis not present

## 2023-11-23 DIAGNOSIS — F445 Conversion disorder with seizures or convulsions: Secondary | ICD-10-CM | POA: Diagnosis not present

## 2023-11-29 DIAGNOSIS — R1312 Dysphagia, oropharyngeal phase: Secondary | ICD-10-CM | POA: Diagnosis not present

## 2023-11-29 DIAGNOSIS — Z9181 History of falling: Secondary | ICD-10-CM | POA: Diagnosis not present

## 2023-11-29 DIAGNOSIS — Z7902 Long term (current) use of antithrombotics/antiplatelets: Secondary | ICD-10-CM | POA: Diagnosis not present

## 2023-11-29 DIAGNOSIS — F445 Conversion disorder with seizures or convulsions: Secondary | ICD-10-CM | POA: Diagnosis not present

## 2023-11-29 DIAGNOSIS — Z556 Problems related to health literacy: Secondary | ICD-10-CM | POA: Diagnosis not present

## 2023-11-29 DIAGNOSIS — H539 Unspecified visual disturbance: Secondary | ICD-10-CM | POA: Diagnosis not present

## 2023-11-29 DIAGNOSIS — H9190 Unspecified hearing loss, unspecified ear: Secondary | ICD-10-CM | POA: Diagnosis not present

## 2023-11-29 DIAGNOSIS — I69344 Monoplegia of lower limb following cerebral infarction affecting left non-dominant side: Secondary | ICD-10-CM | POA: Diagnosis not present

## 2023-11-29 DIAGNOSIS — I10 Essential (primary) hypertension: Secondary | ICD-10-CM | POA: Diagnosis not present

## 2023-11-29 DIAGNOSIS — G8191 Hemiplegia, unspecified affecting right dominant side: Secondary | ICD-10-CM | POA: Diagnosis not present

## 2023-11-29 DIAGNOSIS — Z993 Dependence on wheelchair: Secondary | ICD-10-CM | POA: Diagnosis not present

## 2023-11-29 DIAGNOSIS — M6250 Muscle wasting and atrophy, not elsewhere classified, unspecified site: Secondary | ICD-10-CM | POA: Diagnosis not present

## 2023-11-29 DIAGNOSIS — F419 Anxiety disorder, unspecified: Secondary | ICD-10-CM | POA: Diagnosis not present

## 2023-12-05 ENCOUNTER — Telehealth: Payer: Self-pay | Admitting: Neurology

## 2023-12-05 NOTE — Telephone Encounter (Signed)
 Pt said had 2 seizures last week in the wheelchair and was attempting to stand up to get on the potty. Had seizure and fell and my head hit on the wall hard enough to put a hole the wall.There was no injuries.

## 2023-12-05 NOTE — Telephone Encounter (Signed)
 Returned to call patient, he reports that he has 2 seizures last week, one time he hit his head on the wall and put a hole in wall. HE states EMS came both times and stated " everything checked out". He did report seeing "red, yellow and blue colors" after the seizure. He denies missing medication doses , no recent illnesses and is denies seizure triggers or why these happened. He says he has a ride to get labwork done. Advised I would send to Maralyn Sago and see if she wanted lab work completed. He was appreciative of call

## 2023-12-06 DIAGNOSIS — F39 Unspecified mood [affective] disorder: Secondary | ICD-10-CM | POA: Diagnosis not present

## 2023-12-06 DIAGNOSIS — F4010 Social phobia, unspecified: Secondary | ICD-10-CM | POA: Diagnosis not present

## 2023-12-06 DIAGNOSIS — F411 Generalized anxiety disorder: Secondary | ICD-10-CM | POA: Diagnosis not present

## 2023-12-06 NOTE — Telephone Encounter (Signed)
 Call to patient, no answer. Left message to offer 8:15 appt with sarah on 12/07/23. Spot held for him. Asked to call back

## 2023-12-06 NOTE — Telephone Encounter (Signed)
 Pt called back and accepted the appointment for 3-5 with an arrival time of 7:45 to see Maralyn Sago, NP

## 2023-12-07 ENCOUNTER — Ambulatory Visit: Admitting: Neurology

## 2023-12-07 ENCOUNTER — Encounter: Payer: Self-pay | Admitting: Neurology

## 2023-12-07 ENCOUNTER — Telehealth: Payer: Self-pay | Admitting: Neurology

## 2023-12-07 ENCOUNTER — Telehealth: Payer: Self-pay

## 2023-12-07 VITALS — BP 126/82 | HR 88 | Ht 68.0 in | Wt 183.0 lb

## 2023-12-07 DIAGNOSIS — R569 Unspecified convulsions: Secondary | ICD-10-CM | POA: Diagnosis not present

## 2023-12-07 DIAGNOSIS — I69359 Hemiplegia and hemiparesis following cerebral infarction affecting unspecified side: Secondary | ICD-10-CM | POA: Diagnosis not present

## 2023-12-07 DIAGNOSIS — I639 Cerebral infarction, unspecified: Secondary | ICD-10-CM

## 2023-12-07 DIAGNOSIS — R269 Unspecified abnormalities of gait and mobility: Secondary | ICD-10-CM | POA: Diagnosis not present

## 2023-12-07 DIAGNOSIS — Z982 Presence of cerebrospinal fluid drainage device: Secondary | ICD-10-CM

## 2023-12-07 DIAGNOSIS — I69398 Other sequelae of cerebral infarction: Secondary | ICD-10-CM

## 2023-12-07 MED ORDER — OXCARBAZEPINE 300 MG PO TABS: 450.0000 mg | ORAL_TABLET | Freq: Two times a day (BID) | ORAL | 1 refills | Status: DC

## 2023-12-07 NOTE — Progress Notes (Addendum)
 Patient: [name] [place] Date of Birth: Mar 01, 1971  Reason for Visit: Follow up History from: Patient Primary Neurologist: [name] [place]  ASSESSMENT AND PLAN 53 y.o. year old male   1.  History of posterior fossa ependymoma 2.  Gait abnormalities, fall risk 3.  Seizures 4.  Possible right brain acute stroke vs focal seizure with Todd's paralysis, presenting left-sided weakness November 2024.  CT was limited due to artifact from bilateral cochlear implants, MRI of the brain not performed for this reason. With continued left sided weakness.  -Recurrent seizure on Trileptal 300 mg twice daily, will increase to 450 mg twice daily, check labs today  -Previously tried and failed: Depakote (weight gain), Topamax (recurrent seizures) -Ambulatory 72 hour video EEG  -Referral to Home Health for PT, OT, Nursing, Social work. Needs more assistance at home, needs more supervision due to recurrent falls -Continue Plavix 75 mg daily for secondary stroke prevention -Strict management of vascular risk factors with a goal BP less than 130/90, A1c less than 7.0, LDL less than 70 for secondary stroke prevention -Call for recurrent seizures, follow up in 4 months with me or Dr. Terrace [place]  Meds ordered this encounter  Medications   DISCONTD: Oxcarbazepine (TRILEPTAL) 300 MG tablet    Sig: Take 1.5 tablets (450 mg total) by mouth 2 (two) times daily.    Dispense:  270 tablet    Refill:  1   Oxcarbazepine (TRILEPTAL) 300 MG tablet    Sig: Take 1.5 tablets (450 mg total) by mouth 2 (two) times daily.    Dispense:  270 tablet    Refill:  1   Orders Placed This Encounter  Procedures   CBC with Differential/Platelet   CMP   10-Hydroxycarbazepine   Ambulatory referral to Social Work   Ambulatory referral to Home Health   AMBULATORY EEG   HISTORY  [name] [place], is a 53 year old male, patient of [name], following up for seizure, his primary care physician is Dr. Nathanial [place], [place] Fortes he drove himself to [place] today on  October 25, 2022   I reviewed and summarized the referring note. PMHX. Ependymoma s/p resection, radiation therapy, in [name].   He was seen by [name] since May 16, 2018previous was under the care of neurologist at [name], he used to live with his aunt, who passed away, his cousin 02/17/24 [name] [phone] is his power of attorney, who live close to him, I was able to talk with 02-17-24, reported that patient complains of falling down passing out spells, but she has not witnessed any recent spells, she also reported that [name] [place] is not eating healthy balanced diet, most of the 14 episode happened when he was bending over, she is suspicious there might be dehydration/malnutrition component, patient also has a lot of financial strain, rely on family to provide help,   He lives independent now, with his service dog, drove to [place] today,   He had a history of posterior fossa ependymoma, diagnosed at age 22, underwent multiple brain surgery, VP shunt placement, brain radiation   He was followed by [facility], had a repeat CT scan regularly,   He also suffered severe bilateral hearing loss, seen by ENT Dr. Maximino [place], had a cochlear implant, in 2021   In addition, he also complains of slow worsening gait abnormality, decreased bilateral vision,   I personally reviewed CT head and MRI of the brain in May 2022 severely limited examination due to artifact from left cochlear implant, postsurgical change from  previous suboccipital craniotomy, progressive T2/FLAIR signal abnormality within the right cerebellum, favor to reflect progressive posttreatment effect, right parietal VP shunt in place without hydrocephalus, underlying atrophy with chronic small vessel disease   UPDATE Nov 10 2022: He drove to [place] today, despite multiple conversations in the past, he should not drive, with his declining functional status, gait abnormality, multiple brain surgery, seizure, he lives  alone, cousin was his power of attorney, he also have significant gait abnormality, high risk for fall, EEG today showed no significant abnormality   Since his medication was switched to Depakote ER 500 mg 2 tablets at night, he complains of increased unsteadiness, dizziness sensation, he did stop Topamax  Update April 27, 2023 SS: Dr. Terrace [place] started Depakote in January 2024, he reported weight gain in May 2024, was switched to Trileptal. Tolerating Trileptal 300 mg BID. In April 2024, fall when to [name], no imaging abnormality. He lives alone, his cousin lives close by. He drives a car. EEG was normal feb 2024. He has gait instability, grabs onto walls. Passing out spells are less, are once every 2 weeks. Fall in the floor suddenly, hard time getting up feels weak and dizzy. He doesn't know if these spells are seizures?  Update December 07, 2023 SS: At last visit ambulatory EEG was ordered, was not completed.  Presented to the ER in November 2024 with left-sided weakness.  Possible right brain acute stroke but differential diagnosis focal seizure with Todd's paralysis. -CT head limited by artifact due to bilateral cochlear implants -CTA head and neck no LVO or significant stenosis, markedly attenuated and irregular appearance of right SCA felt due to prior radiation treatment -MRI of the brain not performed due to predicted severe artifact with bilateral cochlear implants -2D echo EF 60 to 65% -LDL 98, started Lipitor 40 -A1c 5.4 -UDS positive for benzos (prescribed Xanax outpatient) -EEG was normal -On Plavix 75 mg daily prior to admission, DAPT aspirin 81 and Plavix 75 for 3 weeks then Plavix alone -Recommended inpatient rehab  In the ER 09/29/2023 for recurrent falls, CT head limited due to cochlear implant.  Here with friend, [name] [place]. Reports [facility] came out to evaluate and recommended a power chair. He bought a power chair on his own. He is living with his grandmother (she has dementia), her  caregiver helps take care of him. He fell few days, got stuck between the toilet and the wall. Close to daily falls, left side is weak, gives out. Reports seizure every other day described as vision turns yellow, shakes all over, loses consciousness. EMS came last week, he was stuck under the bed.  He doesn't drive anymore. Remains on Trileptal 300 mg BID. "Seizures" could be when lying down or up moving around. PT comes out once a week. Remains on Plavix 75 mg daily. Using hand rails in the bathroom, is not using walker, is scooting around in the wheelchair. Claims the left arm and leg are still weak. Sees double vision poor periphery, sees [facility], has prisms.    REVIEW OF SYSTEMS: Out of a complete 14 system review of symptoms, the patient complains only of the following symptoms, and all other reviewed systems are negative.  See HPI  ALLERGIES: Allergies  Allergen Reactions   Baclofen     Urinary retension   Seroquel [Quetiapine] Other (See Comments)    Weight gain   Sulfa Antibiotics Other (See Comments)    Reaction ??   Coconut Flavoring Agent (Non-Screening) Rash and Other (See Comments)  ANYTHING COCONUT- allergic to this   Contrast Media [Iodinated Contrast Media] Rash    HOME MEDICATIONS: Outpatient Medications Prior to Visit  Medication Sig Dispense Refill   acetaminophen (TYLENOL) 500 MG tablet Take 2 tablets (1,000 mg total) by mouth every 8 (eight) hours.     ALPRAZolam (XANAX) 0.5 MG tablet Take 1 tablet (0.5 mg total) by mouth 2 (two) times daily. 60 tablet 2   atorvastatin (LIPITOR) 40 MG tablet Take 1 tablet (40 mg total) by mouth daily.     clopidogrel (PLAVIX) 75 MG tablet TAKE 1 TABLET(75 MG) BY MOUTH DAILY 90 tablet 2   DULoxetine (CYMBALTA) 30 MG capsule Take 1 capsule (30 mg total) by mouth daily.     fluticasone (FLONASE) 50 MCG/ACT nasal spray Place 2 sprays into both nostrils 2 (two) times daily as needed for allergies or rhinitis.     gabapentin  (NEURONTIN) 300 MG capsule Take 1 capsule (300 mg total) by mouth 3 (three) times daily.     metoprolol succinate (TOPROL-XL) 50 MG 24 hr tablet Take 50 mg by mouth daily. Take with or immediately following a meal.     Oxcarbazepine (TRILEPTAL) 300 MG tablet TAKE 1 TABLET(300 MG) BY MOUTH TWICE DAILY 60 tablet 6   polyethylene glycol (MIRALAX / GLYCOLAX) 17 g packet Take 17 g by mouth daily as needed for moderate constipation.     cyanocobalamin 1000 MCG tablet Take 1 tablet (1,000 mcg total) by mouth daily. (Patient not taking: Reported on 12/07/2023)     lidocaine (LIDODERM) 5 % Place 2 patches onto the skin daily as needed. Remove & Discard patch within 12 hours or as directed by MD (Patient not taking: Reported on 12/07/2023)     No facility-administered medications prior to visit.    PAST MEDICAL HISTORY: Past Medical History:  Diagnosis Date   Abnormal gait    Acute arterial ischemic stroke, vertebrobasilar, thalamic (HCC) 06/08/2018   Acute CVA (cerebrovascular accident) (HCC) 06/07/2018   Acute CVA (cerebrovascular accident) (HCC) 06/07/2018   Allergic rhinitis    Anxiety    Arthritis 11/05/2013   Atypical chest pain 11/30/2017   Bilateral arm weakness    Bilateral hearing loss 07/28/2020   Brain cancer (HCC)    Cerebral thrombosis with cerebral infarction 04/17/2019   Cochlear implant in place with multiple channels 05/15/2020   CVA (cerebrovascular accident) (HCC) 04/18/2019   Depression    Diplopia    Dyspnea on exertion 11/30/2017   Ependymoma of brain (HCC)    Episode of change in speech 04/17/2019   Generalized anxiety disorder 11/28/2018   Headache 11/05/2013   Hearing loss, sensorineural    Hemiparesis and alteration of sensations as late effects of stroke (HCC) 09/04/2018   History of stroke 04/01/2020   Hypothyroidism    Insomnia    Left leg weakness    Low vitamin B12 level    Memory loss 11/05/2013   Myalgia 04/18/2017   OSA on CPAP 11/28/2018   Recurrent  falls    Refusal of blood transfusions as patient is Jehovah's Witness 04/01/2020   Seizure (HCC)    Seizures (HCC)    Sleeping difficulty 11/05/2013   Stroke-like symptoms    Ventricular ectopy 06/04/2020   Weakness 04/16/2019   Weakness generalized 04/17/2019    PAST SURGICAL HISTORY: Past Surgical History:  Procedure Laterality Date   BRAIN SURGERY     HERNIA REPAIR     SHUNT REVISION      FAMILY HISTORY: Family History  Problem Relation Age of Onset   Brain cancer Mother    High blood pressure Mother     SOCIAL HISTORY: Social History   Socioeconomic History   Marital status: Single    Spouse name: Not on file   Number of children: 0   Years of education: 12   Highest education level: Not on file  Occupational History   Occupation: Disabled  Tobacco Use   Smoking status: Never   Smokeless tobacco: Never  Vaping Use   Vaping status: Never Used  Substance and Sexual Activity   Alcohol use: No   Drug use: No   Sexual activity: Yes    Birth control/protection: None  Other Topics Concern   Not on file  Social History Narrative   Lives with Aunt Karma Ganja)   Caffeine use: No coffee   Soda- trying to quit      Right-handed   Social Drivers of Corporate investment banker Strain: Low Risk  (06/07/2018)   Overall Financial Resource Strain (CARDIA)    Difficulty of Paying Living Expenses: Not hard at all  Food Insecurity: No Food Insecurity (08/22/2023)   Hunger Vital Sign    Worried About Running Out of Food in the Last Year: Never true    Ran Out of Food in the Last Year: Never true  Transportation Needs: No Transportation Needs (08/22/2023)   PRAPARE - Administrator, Civil Service (Medical): No    Lack of Transportation (Non-Medical): No  Physical Activity: Unknown (06/07/2018)   Exercise Vital Sign    Days of Exercise per Week: Patient declined    Minutes of Exercise per Session: Patient declined  Stress: No Stress Concern Present  (06/07/2018)   [employer] of Occupational Health - Occupational Stress Questionnaire    Feeling of Stress : Not at all  Social Connections: Unknown (06/07/2018)   Social Connection and Isolation Panel [NHANES]    Frequency of Communication with Friends and Family: Twice a week    Frequency of Social Gatherings with Friends and Family: Twice a week    Attends Religious Services: Never    Database administrator or Organizations: No    Attends Banker Meetings: Never    Marital Status: Not on file  Intimate Partner Violence: Not At Risk (08/23/2023)   Humiliation, Afraid, Rape, and Kick questionnaire    Fear of Current or Ex-Partner: No    Emotionally Abused: No    Physically Abused: No    Sexually Abused: No   PHYSICAL EXAM  Vitals:   12/07/23 0755  BP: 126/82  Pulse: 88  Weight: 183 lb (83 kg)  Height: 5\' 8"  (1.727 m)   Body mass index is 27.83 kg/m.  Generalized: Well developed, in no acute distress  Neurological examination  Mentation: Alert oriented to time, place, history taking. Follows all commands speech and language fluent Cranial nerve II-XII: Pupils were equal round reactive to light. Extraocular movements were full, trouble with periphery vision #'s. Facial sensation and strength were normal. Head turning and shoulder shrug  were normal and symmetric. Motor: Mild weakness to left arm and leg, however not much effort given during exam. Noted to freely moving left arm during history Sensory: Reported decreased soft touch to left face, arm, leg Coordination: Cerebellar testing reveals good finger-nose-finger bilaterally but is hard to lift the legs, is slow Gait and station: Seated in wheelchair, slow to stand with push off, forward leaning, unsteady Reflexes: Deep tendon reflexes are  symmetric and normal  DIAGNOSTIC DATA (LABS, IMAGING, TESTING) - I reviewed patient records, labs, notes, testing and imaging myself where available.  Lab Results   Component Value Date   WBC 4.7 09/29/2023   HGB 15.1 09/29/2023   HCT 43.3 09/29/2023   MCV 89.6 09/29/2023   PLT 178 09/29/2023      Component Value Date/Time   NA 139 09/29/2023 1118   NA 140 04/27/2023 1552   NA 142 11/06/2014 1751   K 3.1 (L) 09/29/2023 1118   K 3.7 11/06/2014 1751   CL 104 09/29/2023 1118   CL 106 11/06/2014 1751   CO2 24 09/29/2023 1118   CO2 29 11/06/2014 1751   GLUCOSE 100 (H) 09/29/2023 1118   GLUCOSE 91 11/06/2014 1751   BUN 10 09/29/2023 1118   BUN 11 04/27/2023 1552   BUN 14 11/06/2014 1751   CREATININE 0.94 09/29/2023 1118   CREATININE 1.11 11/06/2014 1751   CALCIUM 9.2 09/29/2023 1118   CALCIUM 8.8 11/06/2014 1751   PROT 6.5 09/29/2023 1118   PROT 6.6 04/27/2023 1552   PROT 6.9 11/06/2014 1751   ALBUMIN 3.9 09/29/2023 1118   ALBUMIN 4.5 04/27/2023 1552   ALBUMIN 3.8 11/06/2014 1751   AST 23 09/29/2023 1118   AST 33 11/06/2014 1751   ALT 27 09/29/2023 1118   ALT 55 11/06/2014 1751   ALKPHOS 71 09/29/2023 1118   ALKPHOS 89 11/06/2014 1751   BILITOT 0.6 09/29/2023 1118   BILITOT 0.3 04/27/2023 1552   BILITOT 0.4 11/06/2014 1751   GFRNONAA >60 09/29/2023 1118   GFRNONAA >60 11/06/2014 1751   GFRNONAA >60 03/12/2014 1140   GFRAA >60 04/17/2019 0810   GFRAA >60 11/06/2014 1751   GFRAA >60 03/12/2014 1140   Lab Results  Component Value Date   CHOL 162 08/19/2023   HDL 35 (L) 08/19/2023   LDLCALC 98 08/19/2023   TRIG 145 08/19/2023   CHOLHDL 4.6 08/19/2023   Lab Results  Component Value Date   HGBA1C 5.4 08/19/2023   Lab Results  Component Value Date   VITAMINB12 134 (L) 08/20/2023   Lab Results  Component Value Date   TSH 4.350 10/25/2022    Margie Ege, AGNP-C, DNP 12/07/2023, 8:08 AM [facility] [address], [address] [place], [place] [address] [phone]  Addendum: Agree 72 hours video EEG monitoring, to better characterize his spells.  DDx also include medication side effect?  Orthostatic  hypotension?

## 2023-12-07 NOTE — Telephone Encounter (Signed)
 [facility] is taking this patient and they will also set him up with a Child psychotherapist.

## 2023-12-07 NOTE — Patient Instructions (Addendum)
 Increase Trileptal 450 mg twice daily, check labs today  72 hour ambulatory video EEG to evaluate seizures Order Home Health, Social work   Call for seizures Continue Plavix for secondary stroke prevention, Strict management of vascular risk factors with a goal BP less than 130/90, A1c less than 7.0, LDL less than 70 for secondary stroke prevention

## 2023-12-08 LAB — CBC WITH DIFFERENTIAL/PLATELET
Basophils Absolute: 0.1 10*3/uL (ref 0.0–0.2)
Basos: 1 %
EOS (ABSOLUTE): 0.3 10*3/uL (ref 0.0–0.4)
Eos: 3 %
Hematocrit: 44.6 % (ref 37.5–51.0)
Hemoglobin: 14.7 g/dL (ref 13.0–17.7)
Immature Grans (Abs): 0.1 10*3/uL (ref 0.0–0.1)
Immature Granulocytes: 1 %
Lymphocytes Absolute: 1.7 10*3/uL (ref 0.7–3.1)
Lymphs: 22 %
MCH: 31.6 pg (ref 26.6–33.0)
MCHC: 33 g/dL (ref 31.5–35.7)
MCV: 96 fL (ref 79–97)
Monocytes Absolute: 0.6 10*3/uL (ref 0.1–0.9)
Monocytes: 8 %
Neutrophils Absolute: 4.8 10*3/uL (ref 1.4–7.0)
Neutrophils: 65 %
Platelets: 240 10*3/uL (ref 150–450)
RBC: 4.65 x10E6/uL (ref 4.14–5.80)
RDW: 12 % (ref 11.6–15.4)
WBC: 7.5 10*3/uL (ref 3.4–10.8)

## 2023-12-10 DIAGNOSIS — Z7902 Long term (current) use of antithrombotics/antiplatelets: Secondary | ICD-10-CM | POA: Diagnosis not present

## 2023-12-10 DIAGNOSIS — G8191 Hemiplegia, unspecified affecting right dominant side: Secondary | ICD-10-CM | POA: Diagnosis not present

## 2023-12-10 DIAGNOSIS — F419 Anxiety disorder, unspecified: Secondary | ICD-10-CM | POA: Diagnosis not present

## 2023-12-10 DIAGNOSIS — I10 Essential (primary) hypertension: Secondary | ICD-10-CM | POA: Diagnosis not present

## 2023-12-10 DIAGNOSIS — I69344 Monoplegia of lower limb following cerebral infarction affecting left non-dominant side: Secondary | ICD-10-CM | POA: Diagnosis not present

## 2023-12-10 DIAGNOSIS — R1312 Dysphagia, oropharyngeal phase: Secondary | ICD-10-CM | POA: Diagnosis not present

## 2023-12-10 DIAGNOSIS — Z556 Problems related to health literacy: Secondary | ICD-10-CM | POA: Diagnosis not present

## 2023-12-10 DIAGNOSIS — Z9181 History of falling: Secondary | ICD-10-CM | POA: Diagnosis not present

## 2023-12-10 DIAGNOSIS — H539 Unspecified visual disturbance: Secondary | ICD-10-CM | POA: Diagnosis not present

## 2023-12-10 DIAGNOSIS — Z993 Dependence on wheelchair: Secondary | ICD-10-CM | POA: Diagnosis not present

## 2023-12-10 DIAGNOSIS — F445 Conversion disorder with seizures or convulsions: Secondary | ICD-10-CM | POA: Diagnosis not present

## 2023-12-10 DIAGNOSIS — H9190 Unspecified hearing loss, unspecified ear: Secondary | ICD-10-CM | POA: Diagnosis not present

## 2023-12-10 DIAGNOSIS — M6250 Muscle wasting and atrophy, not elsewhere classified, unspecified site: Secondary | ICD-10-CM | POA: Diagnosis not present

## 2023-12-11 DIAGNOSIS — I509 Heart failure, unspecified: Secondary | ICD-10-CM | POA: Diagnosis not present

## 2023-12-11 DIAGNOSIS — R296 Repeated falls: Secondary | ICD-10-CM | POA: Diagnosis not present

## 2023-12-11 DIAGNOSIS — I69354 Hemiplegia and hemiparesis following cerebral infarction affecting left non-dominant side: Secondary | ICD-10-CM | POA: Diagnosis not present

## 2023-12-11 DIAGNOSIS — I69398 Other sequelae of cerebral infarction: Secondary | ICD-10-CM | POA: Diagnosis not present

## 2023-12-11 DIAGNOSIS — M1990 Unspecified osteoarthritis, unspecified site: Secondary | ICD-10-CM | POA: Diagnosis not present

## 2023-12-11 DIAGNOSIS — Z7902 Long term (current) use of antithrombotics/antiplatelets: Secondary | ICD-10-CM | POA: Diagnosis not present

## 2023-12-11 DIAGNOSIS — I110 Hypertensive heart disease with heart failure: Secondary | ICD-10-CM | POA: Diagnosis not present

## 2023-12-11 DIAGNOSIS — G4700 Insomnia, unspecified: Secondary | ICD-10-CM | POA: Diagnosis not present

## 2023-12-11 DIAGNOSIS — E039 Hypothyroidism, unspecified: Secondary | ICD-10-CM | POA: Diagnosis not present

## 2023-12-11 DIAGNOSIS — F32A Depression, unspecified: Secondary | ICD-10-CM | POA: Diagnosis not present

## 2023-12-11 DIAGNOSIS — Z556 Problems related to health literacy: Secondary | ICD-10-CM | POA: Diagnosis not present

## 2023-12-11 DIAGNOSIS — R209 Unspecified disturbances of skin sensation: Secondary | ICD-10-CM | POA: Diagnosis not present

## 2023-12-11 DIAGNOSIS — G4089 Other seizures: Secondary | ICD-10-CM | POA: Diagnosis not present

## 2023-12-11 DIAGNOSIS — G4733 Obstructive sleep apnea (adult) (pediatric): Secondary | ICD-10-CM | POA: Diagnosis not present

## 2023-12-11 DIAGNOSIS — F411 Generalized anxiety disorder: Secondary | ICD-10-CM | POA: Diagnosis not present

## 2023-12-11 DIAGNOSIS — Z85841 Personal history of malignant neoplasm of brain: Secondary | ICD-10-CM | POA: Diagnosis not present

## 2023-12-11 DIAGNOSIS — H903 Sensorineural hearing loss, bilateral: Secondary | ICD-10-CM | POA: Diagnosis not present

## 2023-12-11 DIAGNOSIS — Z993 Dependence on wheelchair: Secondary | ICD-10-CM | POA: Diagnosis not present

## 2023-12-11 DIAGNOSIS — R32 Unspecified urinary incontinence: Secondary | ICD-10-CM | POA: Diagnosis not present

## 2023-12-12 ENCOUNTER — Telehealth: Payer: Self-pay | Admitting: Neurology

## 2023-12-12 LAB — COMPREHENSIVE METABOLIC PANEL
ALT: 29 IU/L (ref 0–44)
AST: 20 IU/L (ref 0–40)
Albumin: 4.3 g/dL (ref 4.1–5.1)
Alkaline Phosphatase: 126 IU/L — ABNORMAL HIGH (ref 44–121)
BUN/Creatinine Ratio: 13 (ref 9–20)
BUN: 12 mg/dL (ref 6–24)
Bilirubin Total: 0.3 mg/dL (ref 0.0–1.2)
CO2: 21 mmol/L (ref 20–29)
Calcium: 8.9 mg/dL (ref 8.7–10.2)
Chloride: 103 mmol/L (ref 96–106)
Creatinine, Ser: 0.92 mg/dL (ref 0.76–1.27)
Globulin, Total: 1.9 g/dL (ref 1.5–4.5)
Glucose: 83 mg/dL (ref 70–99)
Potassium: 4.3 mmol/L (ref 3.5–5.2)
Sodium: 140 mmol/L (ref 134–144)
Total Protein: 6.2 g/dL (ref 6.0–8.5)
eGFR: 101 mL/min/{1.73_m2} (ref >59)

## 2023-12-12 LAB — 10-HYDROXYCARBAZEPINE: Oxcarbazepine SerPl-Mcnc: 14 ug/mL (ref 10–35)

## 2023-12-12 NOTE — Telephone Encounter (Signed)
 Pam,RN w/ [facility] has called for verbal orders for seizures and falls  1 week 1, 1 month 1, 2 PRN's her # with secure vm is [phone]

## 2023-12-12 NOTE — Telephone Encounter (Signed)
 Called and spoke to pam rn and stated that we are agreeable tto verbal orders

## 2023-12-12 NOTE — Telephone Encounter (Signed)
 Call to pam, left sevure VM agreeing to verbal orders asrequested

## 2023-12-13 ENCOUNTER — Emergency Department (HOSPITAL_COMMUNITY)
Admission: EM | Admit: 2023-12-13 | Discharge: 2023-12-13 | Disposition: A | Discharge status: Home / Self Care | Attending: Emergency Medicine | Admitting: Emergency Medicine

## 2023-12-13 ENCOUNTER — Emergency Department (HOSPITAL_COMMUNITY)

## 2023-12-13 ENCOUNTER — Encounter (HOSPITAL_COMMUNITY): Payer: Self-pay

## 2023-12-13 ENCOUNTER — Telehealth: Payer: Self-pay | Admitting: Anesthesiology

## 2023-12-13 DIAGNOSIS — I10 Essential (primary) hypertension: Secondary | ICD-10-CM | POA: Diagnosis not present

## 2023-12-13 DIAGNOSIS — R791 Abnormal coagulation profile: Secondary | ICD-10-CM | POA: Diagnosis not present

## 2023-12-13 DIAGNOSIS — R000 Tachycardia, unspecified: Secondary | ICD-10-CM | POA: Diagnosis not present

## 2023-12-13 DIAGNOSIS — W19XXXA Unspecified fall, initial encounter: Secondary | ICD-10-CM | POA: Insufficient documentation

## 2023-12-13 DIAGNOSIS — R4182 Altered mental status, unspecified: Secondary | ICD-10-CM | POA: Insufficient documentation

## 2023-12-13 DIAGNOSIS — R569 Unspecified convulsions: Secondary | ICD-10-CM

## 2023-12-13 DIAGNOSIS — G9389 Other specified disorders of brain: Secondary | ICD-10-CM | POA: Diagnosis not present

## 2023-12-13 DIAGNOSIS — R799 Abnormal finding of blood chemistry, unspecified: Secondary | ICD-10-CM | POA: Diagnosis not present

## 2023-12-13 DIAGNOSIS — R1310 Dysphagia, unspecified: Secondary | ICD-10-CM | POA: Insufficient documentation

## 2023-12-13 DIAGNOSIS — Z982 Presence of cerebrospinal fluid drainage device: Secondary | ICD-10-CM | POA: Diagnosis not present

## 2023-12-13 DIAGNOSIS — I959 Hypotension, unspecified: Secondary | ICD-10-CM | POA: Diagnosis not present

## 2023-12-13 DIAGNOSIS — G459 Transient cerebral ischemic attack, unspecified: Secondary | ICD-10-CM | POA: Diagnosis not present

## 2023-12-13 DIAGNOSIS — S0990XA Unspecified injury of head, initial encounter: Secondary | ICD-10-CM | POA: Diagnosis not present

## 2023-12-13 DIAGNOSIS — R4701 Aphasia: Secondary | ICD-10-CM | POA: Diagnosis not present

## 2023-12-13 DIAGNOSIS — R9082 White matter disease, unspecified: Secondary | ICD-10-CM | POA: Diagnosis not present

## 2023-12-13 DIAGNOSIS — Z043 Encounter for examination and observation following other accident: Secondary | ICD-10-CM | POA: Diagnosis not present

## 2023-12-13 LAB — COMPREHENSIVE METABOLIC PANEL
ALT: 21 U/L (ref 0–44)
AST: 17 U/L (ref 15–41)
Albumin: 3.4 g/dL — ABNORMAL LOW (ref 3.5–5.0)
Alkaline Phosphatase: 76 U/L (ref 38–126)
Anion gap: 8 (ref 5–15)
BUN: 13 mg/dL (ref 6–20)
CO2: 30 mmol/L (ref 22–32)
Calcium: 8.4 mg/dL — ABNORMAL LOW (ref 8.9–10.3)
Chloride: 101 mmol/L (ref 98–111)
Creatinine, Ser: 1.28 mg/dL — ABNORMAL HIGH (ref 0.61–1.24)
GFR, Estimated: >60 mL/min (ref >60)
Glucose, Bld: 97 mg/dL (ref 70–99)
Potassium: 3.8 mmol/L (ref 3.5–5.1)
Sodium: 139 mmol/L (ref 135–145)
Total Bilirubin: 0.6 mg/dL (ref 0.0–1.2)
Total Protein: 5.7 g/dL — ABNORMAL LOW (ref 6.5–8.1)

## 2023-12-13 LAB — I-STAT CHEM 8, ED
BUN: 14 mg/dL (ref 6–20)
Calcium, Ion: 1.06 mmol/L — ABNORMAL LOW (ref 1.15–1.40)
Chloride: 102 mmol/L (ref 98–111)
Creatinine, Ser: 1.2 mg/dL (ref 0.61–1.24)
Glucose, Bld: 91 mg/dL (ref 70–99)
HCT: 40 % (ref 39.0–52.0)
Hemoglobin: 13.6 g/dL (ref 13.0–17.0)
Potassium: 3.9 mmol/L (ref 3.5–5.1)
Sodium: 139 mmol/L (ref 135–145)
TCO2: 27 mmol/L (ref 22–32)

## 2023-12-13 LAB — APTT: aPTT: 27 s (ref 24–36)

## 2023-12-13 LAB — CBC
HCT: 41.5 % (ref 39.0–52.0)
Hemoglobin: 13.9 g/dL (ref 13.0–17.0)
MCH: 32.3 pg (ref 26.0–34.0)
MCHC: 33.5 g/dL (ref 30.0–36.0)
MCV: 96.5 fL (ref 80.0–100.0)
Platelets: 197 10*3/uL (ref 150–400)
RBC: 4.3 MIL/uL (ref 4.22–5.81)
RDW: 12.1 % (ref 11.5–15.5)
WBC: 6 10*3/uL (ref 4.0–10.5)
nRBC: 0 % (ref 0.0–0.2)

## 2023-12-13 LAB — DIFFERENTIAL
Abs Immature Granulocytes: 0.08 10*3/uL — ABNORMAL HIGH (ref 0.00–0.07)
Basophils Absolute: 0.1 10*3/uL (ref 0.0–0.1)
Basophils Relative: 1 %
Eosinophils Absolute: 0.3 10*3/uL (ref 0.0–0.5)
Eosinophils Relative: 4 %
Immature Granulocytes: 1 %
Lymphocytes Relative: 26 %
Lymphs Abs: 1.6 10*3/uL (ref 0.7–4.0)
Monocytes Absolute: 0.6 10*3/uL (ref 0.1–1.0)
Monocytes Relative: 10 %
Neutro Abs: 3.4 10*3/uL (ref 1.7–7.7)
Neutrophils Relative %: 58 %

## 2023-12-13 LAB — MAGNESIUM: Magnesium: 2.5 mg/dL — ABNORMAL HIGH (ref 1.7–2.4)

## 2023-12-13 LAB — PROTIME-INR
INR: 1.1 (ref 0.8–1.2)
Prothrombin Time: 13.9 s (ref 11.4–15.2)

## 2023-12-13 LAB — ETHANOL: Alcohol, Ethyl (B): <10 mg/dL (ref <10)

## 2023-12-13 MED ORDER — ACETAMINOPHEN 325 MG PO TABS: 650.0000 mg | ORAL_TABLET | Freq: Once | ORAL | Status: DC

## 2023-12-13 NOTE — ED Provider Notes (Signed)
 Patient's care assumed at 3:30 PM.  Patient is pending CT of his cervical spine and EEG.  Patient is reported to have had a fall today and concern for seizure.  Patient has been seen by neurology who advised if EEG is normal patient's gabapentin should be reduced to 400 mg 3 times a day as gabapentin is most likely causing sedation. EEG is obtained and reviewed by neurology and shows no evidence of seizure. CT cervical spine shows no acute injury Patient reports he feels like he is back to normal.  He states he has been very sleepy lately when he takes 600 mg of gabapentin. Patient is discharged much improved to follow-up with primary care and neurology.   Elson Areas, PA-C 12/13/23 1807    Coral Spikes, DO 12/13/23 2352

## 2023-12-13 NOTE — ED Notes (Signed)
 Called lab and confirmed magnesium level added on to previous blood work

## 2023-12-13 NOTE — Telephone Encounter (Signed)
 Left message for pt stating his lab work came back normal, per Margie Ege. Ok to leave message per [facility]. Advised to call back if any questions.

## 2023-12-13 NOTE — Discharge Instructions (Addendum)
 Decrease Gabapentin to 400 mg three times a day.  Schedule follow up with your neurologist for recheck.   Return if any problems.

## 2023-12-13 NOTE — Evaluation (Signed)
 Clinical/Bedside Swallow Evaluation Patient Details  Name: [name] Love MRN: [id number] Date of Birth: 03-11-1971  Today's Date: 12/13/2023 Time: SLP Start Time (ACUTE ONLY): 1652 SLP Stop Time (ACUTE ONLY): 1710 SLP Time Calculation (min) (ACUTE ONLY): 18 min  Past Medical History:  Past Medical History:  Diagnosis Date   Abnormal gait    Acute arterial ischemic stroke, vertebrobasilar, thalamic (HCC) 06/08/2018   Acute CVA (cerebrovascular accident) (HCC) 06/07/2018   Acute CVA (cerebrovascular accident) (HCC) 06/07/2018   Allergic rhinitis    Anxiety    Arthritis 11/05/2013   Atypical chest pain 11/30/2017   Bilateral arm weakness    Bilateral hearing loss 07/28/2020   Brain cancer (HCC)    Cerebral thrombosis with cerebral infarction 04/17/2019   Cochlear implant in place with multiple channels 05/15/2020   CVA (cerebrovascular accident) (HCC) 04/18/2019   Depression    Diplopia    Dyspnea on exertion 11/30/2017   Ependymoma of brain (HCC)    Episode of change in speech 04/17/2019   Generalized anxiety disorder 11/28/2018   Headache 11/05/2013   Hearing loss, sensorineural    Hemiparesis and alteration of sensations as late effects of stroke (HCC) 09/04/2018   History of stroke 04/01/2020   Hypothyroidism    Insomnia    Left leg weakness    Low vitamin B12 level    Memory loss 11/05/2013   Myalgia 04/18/2017   OSA on CPAP 11/28/2018   Recurrent falls    Refusal of blood transfusions as patient is Jehovah's Witness 04/01/2020   Seizure (HCC)    Seizures (HCC)    Sleeping difficulty 11/05/2013   Stroke-like symptoms    Ventricular ectopy 06/04/2020   Weakness 04/16/2019   Weakness generalized 04/17/2019   Past Surgical History:  Past Surgical History:  Procedure Laterality Date   BRAIN SURGERY     HERNIA REPAIR     SHUNT REVISION     HPI:  [name] Love is a 53 yo male presenting to ED 3/11 after falling with a hit to his head. Per MD note, pt's niece reports  speech changes, gait unsteadiness, and "not acting like himself". Pt most recently seen by SLP 08/2024 with MBS revealing silent aspiration of thin liquids due to discoordination noted during consecutive swallow attempts. A chin tuck was effective in preventing aspiration at that time, but pt was unable to consistently implement this strategy. He has continued a regular diet with nectar thick liquids since then. PMH includes CVA 2019, anxiety, arthritis, bilateral arm weakness, bilateral hearing loss with cochlear implants, hypothyroidism, memory loss, seizures, insomnia, admitted 08/2024 for acute R CVA (unable to confirm via MRI due to cochlear implants) vs focal seizure with Todd's paralysis, multiple falls    Assessment / Plan / Recommendation  Clinical Impression  Pt reports SLP at [facility] recommended he continue regular diet with nectar thick liquids due to ongoing coughing with trials of thin liquid. He also states he was provided an EMST device, which he uses 3x daily. Oral motor exam appears grossly functional. Thin liquids via cup result in immediate coughing. Nectar thick liquids, purees, and solids were observed without s/s of dysphagia or any further coughing. He endorses a globus sensation at baseline particularly with bread and peanut butter, which is consistent with previous evaluation with this SLP. For now, recommend initiating diet of regular solids with nectar thick liquids. Will proceed with an MBS to reassess oropharyngeal function (next date at earliest). If pt is to discharge prior to completion of MBS,  recommend he return on an OP basis to repeat MBS per [facility] SLP discretion. Will f/u as able. SLP Visit Diagnosis: Dysphagia, unspecified (R13.10)    Aspiration Risk  Mild aspiration risk    Diet Recommendation Regular;Nectar-thick liquid    Liquid Administration via: Cup;Straw Medication Administration: Whole meds with puree Supervision: Patient able to self feed Compensations:  Minimize environmental distractions;Slow rate;Small sips/bites Postural Changes: Seated upright at 90 degrees;Remain upright for at least 30 minutes after po intake    Other  Recommendations Oral Care Recommendations: Oral care BID    Recommendations for follow up therapy are one component of a multi-disciplinary discharge planning process, led by the attending physician.  Recommendations may be updated based on patient status, additional functional criteria and insurance authorization.  Follow up Recommendations Home health SLP      Assistance Recommended at Discharge    Functional Status Assessment Patient has had a recent decline in their functional status and demonstrates the ability to make significant improvements in function in a reasonable and predictable amount of time.  Frequency and Duration min 2x/week  2 weeks       Prognosis Prognosis for improved oropharyngeal function: Good Barriers to Reach Goals: Cognitive deficits;Time post onset      Swallow Study   General HPI: [name] Love is a 53 yo male presenting to ED 3/11 after falling with a hit to his head. Per MD note, pt's niece reports speech changes, gait unsteadiness, and "not acting like himself". Pt most recently seen by SLP 08/2024 with MBS revealing silent aspiration of thin liquids due to discoordination noted during consecutive swallow attempts. A chin tuck was effective in preventing aspiration at that time, but pt was unable to consistently implement this strategy. He has continued a regular diet with nectar thick liquids since then. PMH includes CVA 2019, anxiety, arthritis, bilateral arm weakness, bilateral hearing loss with cochlear implants, hypothyroidism, memory loss, seizures, insomnia, admitted 08/2024 for acute R CVA (unable to confirm via MRI due to cochlear implants) vs focal seizure with Todd's paralysis, multiple falls Type of Study: Bedside Swallow Evaluation Previous Swallow Assessment: see HPI Diet  Prior to this Study: NPO Temperature Spikes Noted: No Respiratory Status: Room air History of Recent Intubation: No Behavior/Cognition: Alert;Cooperative Oral Cavity Assessment: Within Functional Limits Oral Care Completed by SLP: No Oral Cavity - Dentition: Adequate natural dentition Vision: Functional for self-feeding Self-Feeding Abilities: Able to feed self Patient Positioning: Upright in bed Baseline Vocal Quality: Normal Volitional Cough: Strong Volitional Swallow: Able to elicit    Oral/Motor/Sensory Function Overall Oral Motor/Sensory Function: Within functional limits   Ice Chips Ice chips: Not tested   Thin Liquid Thin Liquid: Impaired Presentation: Cup;Self Fed Pharyngeal  Phase Impairments: Cough - Immediate    Nectar Thick Nectar Thick Liquid: Within functional limits Presentation: Cup;Self Fed   Honey Thick Honey Thick Liquid: Not tested   Puree Puree: Within functional limits Presentation: Spoon;Self Fed   Solid     Solid: Within functional limits Presentation: Self Fed      Gwynneth Aliment, M.A., CF-SLP Speech Language Pathology, Acute Rehabilitation Services  Secure Chat preferred [phone]  12/13/2023,5:25 PM

## 2023-12-13 NOTE — Progress Notes (Signed)
STAT EEG complete - results pending. ? ?

## 2023-12-13 NOTE — Procedures (Signed)
 Patient Name: [name] Love  MRN: [id number]  Epilepsy Attending: Charlsie Quest  Referring Physician/Provider: Rejeana Brock, MD  Date: 12/13/2023 Duration: 23.32 mins  Patient history: 53yo M with ams. EEG to evaluate for seizure  Level of alertness: Awake, asleep  AEDs during EEG study: None  Technical aspects: This EEG study was done with scalp electrodes positioned according to the 10-20 International system of electrode placement. Electrical activity was reviewed with band pass filter of 1-70Hz , sensitivity of 7 uV/mm, display speed of 78mm/sec with a 60Hz  notched filter applied as appropriate. EEG data were recorded continuously and digitally stored.  Video monitoring was available and reviewed as appropriate.  Description: The posterior dominant rhythm consists of 7.5 Hz activity of moderate voltage (25-35 uV) seen predominantly in posterior head regions, symmetric and reactive to eye opening and eye closing. Sleep was characterized by vertex waves, sleep spindles (12 to 14 Hz), maximal frontocentral region. EEG showed continuous generalized 3 to 6 Hz theta-delta slowing. Hyperventilation and photic stimulation were not performed.     ABNORMALITY - Continuous slow, generalized  IMPRESSION: This study is suggestive of mild to moderate diffuse encephalopathy. No seizures or epileptiform discharges were seen throughout the recording.  [name] Love

## 2023-12-13 NOTE — Telephone Encounter (Signed)
-----   Message from Glean Salvo sent at 12/13/2023  5:53 AM EDT ----- Please let him know blood work looked good. Thanks

## 2023-12-13 NOTE — Consult Note (Signed)
 NEUROLOGY CONSULT NOTE   Date of service: December 13, 2023 Patient Name: [name] Love MRN:  [id number] DOB:  [birth year] Chief Complaint: "Altered mental status" Requesting Provider: Gerhard Munch, MD  History of Present Illness  [name] Love is a 53 y.o. male with a very complicated past medical history including multiple previous strokes, posterior fossa tumor, seizures who currently lives independently with his service dog.  He uses a motorized wheelchair and is able to drive.  He has bilateral cochlear implants.  He currently takes Trileptal 450 twice daily, gabapentin 600 3 times daily for his seizures.  Both of these were recently increased.  He also takes duloxetine which was also recently increased.  His niece has noticed that he has been more unsteady, and just not "acting quite like himself" since these medications were increased.  This morning, he was getting out of bed around 10 AM, when he had a fall and "put his head through the wall."  There was no report of loss of consciousness, shaking or seizure activity, though it is unclear if this was witnessed.  His niece states that at baseline, he is slightly slow but able to speak, but when he gets "upset or excited" he will mumble incoherently.  LKW: 10 AM Modified rankin score: Four IV Thrombolysis: Bilateral subdural hematomas EVT: No, unlikely to be LVO  NIHSS components Score: Comment  1a Level of Conscious 0[x]  1[]  2[]  3[]      1b LOC Questions 0[]  1[]  2[x]       1c LOC Commands 0[]  1[x]  2[]       2 Best Gaze 0[x]  1[]  2[]       3 Visual 0[x]  1[]  2[]  3[]      4 Facial Palsy 0[x]  1[]  2[]  3[]      5a Motor Arm - left 0[]  1[]  2[x]  3[]  4[]  UN[]    5b Motor Arm - Right 0[]  1[x]  2[]  3[]  4[]  UN[]    6a Motor Leg - Left 0[]  1[]  2[]  3[x]  4[]  UN[]    6b Motor Leg - Right 0[]  1[]  2[]  3[x]  4[]  UN[]    7 Limb Ataxia 0[x]  1[]  2[]  3[]  UN[]     8 Sensory 0[x]  1[]  2[]  UN[]      9 Best Language 0[]  1[]  2[x]  3[]      10 Dysarthria 0[]  1[]  2[x]  UN[]       11 Extinct. and Inattention 0[x]  1[]  2[]       TOTAL: 16        Past History   Past Medical History:  Diagnosis Date   Abnormal gait    Acute arterial ischemic stroke, vertebrobasilar, thalamic (HCC) 06/08/2018   Acute CVA (cerebrovascular accident) (HCC) 06/07/2018   Acute CVA (cerebrovascular accident) (HCC) 06/07/2018   Allergic rhinitis    Anxiety    Arthritis 11/05/2013   Atypical chest pain 11/30/2017   Bilateral arm weakness    Bilateral hearing loss 07/28/2020   Brain cancer (HCC)    Cerebral thrombosis with cerebral infarction 04/17/2019   Cochlear implant in place with multiple channels 05/15/2020   CVA (cerebrovascular accident) (HCC) 04/18/2019   Depression    Diplopia    Dyspnea on exertion 11/30/2017   Ependymoma of brain [facility])    Episode of change in speech 04/17/2019   Generalized anxiety disorder 11/28/2018   Headache 11/05/2013   Hearing loss, sensorineural    Hemiparesis and alteration of sensations as late effects of stroke (HCC) 09/04/2018   History of stroke 04/01/2020   Hypothyroidism    Insomnia    Left leg weakness  Low vitamin B12 level    Memory loss 11/05/2013   Myalgia 04/18/2017   OSA on CPAP 11/28/2018   Recurrent falls    Refusal of blood transfusions as patient is Jehovah's Witness 04/01/2020   Seizure (HCC)    Seizures (HCC)    Sleeping difficulty 11/05/2013   Stroke-like symptoms    Ventricular ectopy 06/04/2020   Weakness 04/16/2019   Weakness generalized 04/17/2019    Past Surgical History:  Procedure Laterality Date   BRAIN SURGERY     HERNIA REPAIR     SHUNT REVISION      Family History: Family History  Problem Relation Age of Onset   Brain cancer Mother    High blood pressure Mother     Social History  reports that he has never smoked. He has never used smokeless tobacco. He reports that he does not drink alcohol and does not use drugs.  Allergies  Allergen Reactions   Baclofen     Urinary retension    Seroquel [Quetiapine] Other (See Comments)    Weight gain   Sulfa Antibiotics Other (See Comments)    Reaction ??   Coconut Flavoring Agent (Non-Screening) Rash and Other (See Comments)    ANYTHING COCONUT- allergic to this   Contrast Media [Iodinated Contrast Media] Rash    Medications  No current facility-administered medications for this encounter.  Current Outpatient Medications:    acetaminophen (TYLENOL) 500 MG tablet, Take 2 tablets (1,000 mg total) by mouth every 8 (eight) hours., Disp: , Rfl:    ALPRAZolam (XANAX) 0.5 MG tablet, Take 1 tablet (0.5 mg total) by mouth 2 (two) times daily., Disp: 60 tablet, Rfl: 2   atorvastatin (LIPITOR) 40 MG tablet, Take 1 tablet (40 mg total) by mouth daily., Disp: , Rfl:    clopidogrel (PLAVIX) 75 MG tablet, TAKE 1 TABLET(75 MG) BY MOUTH DAILY, Disp: 90 tablet, Rfl: 2   cyanocobalamin 1000 MCG tablet, Take 1 tablet (1,000 mcg total) by mouth daily. (Patient not taking: Reported on 12/07/2023), Disp: , Rfl:    DULoxetine (CYMBALTA) 30 MG capsule, Take 1 capsule (30 mg total) by mouth daily., Disp: , Rfl:    fluticasone (FLONASE) 50 MCG/ACT nasal spray, Place 2 sprays into both nostrils 2 (two) times daily as needed for allergies or rhinitis., Disp: , Rfl:    gabapentin (NEURONTIN) 300 MG capsule, Take 1 capsule (300 mg total) by mouth 3 (three) times daily., Disp: , Rfl:    lidocaine (LIDODERM) 5 %, Place 2 patches onto the skin daily as needed. Remove & Discard patch within 12 hours or as directed by MD (Patient not taking: Reported on 12/07/2023), Disp: , Rfl:    metoprolol succinate (TOPROL-XL) 50 MG 24 hr tablet, Take 50 mg by mouth daily. Take with or immediately following a meal., Disp: , Rfl:    Oxcarbazepine (TRILEPTAL) 300 MG tablet, Take 1.5 tablets (450 mg total) by mouth 2 (two) times daily., Disp: 270 tablet, Rfl: 1   polyethylene glycol (MIRALAX / GLYCOLAX) 17 g packet, Take 17 g by mouth daily as needed for moderate constipation., Disp: ,  Rfl:   Vitals   Vitals:   12/13/23 1100  Weight: 88 kg    Body mass index is 29.5 kg/m.  Physical Exam   Constitutional: Appears well-developed and well-nourished.  Neurologic Examination    Neuro: Mental Status: Patient is awake, he appears slightly agitated, when I try to get him to count fingers, he does hold up finger to indicate how  many he is seeing, but does not do so reliably or consistently.   Cranial Nerves: II: He responds to visual stimuli in both hemifield's III,IV, VI: EOMI without ptosis or diploplia.  VII: Facial movement is symmetric.  Motor: He moves all extremities spontaneously and relatively symmetrically, but does not cooperate with formal testing to keep him aloft. Sensory: He response to noxious stimulation bilaterally  Cerebellar: Does not perform       Labs/Imaging/Neurodiagnostic studies   CBC:  Recent Labs  Lab 2023/12/10 0855 12/13/23 1147 12/13/23 1156  WBC 7.5 6.0  --   NEUTROABS 4.8 3.4  --   HGB 14.7 13.9 13.6  HCT 44.6 41.5 40.0  MCV 96 96.5  --   PLT 240 197  --    Basic Metabolic Panel:  Lab Results  Component Value Date   NA 139 12/13/2023   K 3.9 12/13/2023   CO2 21 12-10-23   GLUCOSE 91 12/13/2023   BUN 14 12/13/2023   CREATININE 1.20 12/13/2023   CALCIUM 8.9 12-10-23   GFRNONAA >60 09/29/2023   GFRAA >60 04/17/2019   Lipid Panel:  Lab Results  Component Value Date   LDLCALC 98 08/19/2023   HgbA1c:  Lab Results  Component Value Date   HGBA1C 5.4 08/19/2023   Urine Drug Screen:     Component Value Date/Time   LABOPIA NONE DETECTED 08/18/2023 1247   COCAINSCRNUR NONE DETECTED 08/18/2023 1247   LABBENZ POSITIVE (A) 08/18/2023 1247   AMPHETMU NONE DETECTED 08/18/2023 1247   THCU NONE DETECTED 08/18/2023 1247   LABBARB NONE DETECTED 08/18/2023 1247    Alcohol Level     Component Value Date/Time   ETH <10 08/18/2023 1105   INR  Lab Results  Component Value Date   INR 1.0 08/18/2023   APTT   Lab Results  Component Value Date   APTT 27 08/18/2023   AED levels:  Lab Results  Component Value Date   LAMOTRIGINE 2.8 02/02/2018    CT Head without contrast(Personally reviewed): Chronic subdural hematomas   ASSESSMENT   Keyontay Stolz is a 53 y.o. male with a history of tumor status post occipital decompression, seizures who presents with altered mental status following a fall.  He has been more unsteady since increasing his medications recently and I do wonder if this was a mechanical fall.  Apparently, when he gets really "worked up" he will mumble and be very difficult to understand, and it is possible that this may be playing a role.  With concern for recent seizures, I do think a stat EEG to rule out ongoing seizure activity as etiology of his altered mental status would also be prudent.  RECOMMENDATIONS  Stat EEG CMP, CBC, magnesium If he continues to improve and returns to baseline, I would favor decreasing his gabapentin to 400 3 times daily with outpatient follow-up If he continues to be significantly altered, then he will need to be admitted for observation If admitted would continue gabapentin at 400 3 times daily and Trileptal at 450 twice daily Trileptal level ______________________________________________________________________    Signed, Ritta Slot, MD [facility]

## 2023-12-13 NOTE — ED Provider Notes (Signed)
 [place] EMERGENCY DEPARTMENT AT [facility] Provider Note   CSN: [id number] Arrival date & time: 12/13/23  1146  An emergency department physician performed an initial assessment on this suspected stroke patient at 1146.  History  Chief Complaint  Patient presents with   Code Stroke    [name] Love is a 53 y.o. male.  53 y/o male with complex PMH including multiple previous strokes, posterior fossa tumor, seizures, hearing loss s/p cochlear implant presents to the ED after he had a fall this morning and "put his head through the wall." There was no reported LOC or seizure-like activity, however, patient was noted to have some speech changes. Code stroke was activated PTA.  Per conversation between Neurology and patient's niece, "he currently takes Trileptal 450 twice daily, gabapentin 600 3 times daily for his seizures.  Both of these were recently increased.  He also takes duloxetine which was also recently increased.  His niece has noticed that he has been more unsteady, and just not "acting quite like himself" since these medications were increased. His niece states that at baseline, he is slightly slow but able to speak, but when he gets "upset or excited" he will mumble incoherently".  Patient lives independently with his service dog.  He uses a motorized wheelchair and is able to drive, though he has been advised not to given his seizure history.  The history is provided by the EMS personnel and medical records. No language interpreter was used.       Home Medications Prior to Admission medications   Medication Sig Start Date End Date Taking? Authorizing Provider  acetaminophen (TYLENOL) 500 MG tablet Take 2 tablets (1,000 mg total) by mouth every 8 (eight) hours. 08/23/23   Monna Fam, MD  ALPRAZolam Prudy Feeler) 0.5 MG tablet Take 1 tablet (0.5 mg total) by mouth 2 (two) times daily. 08/23/23   Monna Fam, MD  atorvastatin (LIPITOR) 40 MG tablet Take 1 tablet (40 mg  total) by mouth daily. 08/24/23   Monna Fam, MD  clopidogrel (PLAVIX) 75 MG tablet TAKE 1 TABLET(75 MG) BY MOUTH DAILY 11/11/23   Georgeanna Lea, MD  cyanocobalamin 1000 MCG tablet Take 1 tablet (1,000 mcg total) by mouth daily. Patient not taking: Reported on 12/07/2023 08/24/23   Monna Fam, MD  DULoxetine (CYMBALTA) 30 MG capsule Take 1 capsule (30 mg total) by mouth daily. 08/24/23   Monna Fam, MD  fluticasone [facility]) 50 MCG/ACT nasal spray Place 2 sprays into both nostrils 2 (two) times daily as needed for allergies or rhinitis.    [provider]  gabapentin (NEURONTIN) 300 MG capsule Take 1 capsule (300 mg total) by mouth 3 (three) times daily. 08/23/23   Monna Fam, MD  lidocaine (LIDODERM) 5 % Place 2 patches onto the skin daily as needed. Remove & Discard patch within 12 hours or as directed by MD Patient not taking: Reported on 12/07/2023 08/23/23   Monna Fam, MD  metoprolol succinate (TOPROL-XL) 50 MG 24 hr tablet Take 50 mg by mouth daily. Take with or immediately following a meal.    [provider]  Oxcarbazepine (TRILEPTAL) 300 MG tablet Take 1.5 tablets (450 mg total) by mouth 2 (two) times daily. 12/07/23   Glean Salvo, NP  polyethylene glycol (MIRALAX / GLYCOLAX) 17 g packet Take 17 g by mouth daily as needed for moderate constipation. 08/23/23   Monna Fam, MD      Allergies    Baclofen, Seroquel [quetiapine], Sulfa antibiotics, Coconut  flavoring agent (non-screening), and Contrast media [iodinated contrast media]    Review of Systems   Review of Systems Ten systems reviewed and are negative for acute change, except as noted in the HPI.    Physical Exam Updated Vital Signs BP 113/89   Pulse 79   Temp 98.2 F (36.8 C) (Oral)   Resp 18   Wt 88 kg   SpO2 98%   BMI 29.50 kg/m   Physical Exam Vitals and nursing note reviewed.  Constitutional:      General: He is not in acute distress.    Appearance: He is well-developed. He is  not diaphoretic.  HENT:     Head: Normocephalic.     Comments: No battle's sign or raccoon's eyes. Eyes:     General: No scleral icterus.    Conjunctiva/sclera: Conjunctivae normal.  Neck:     Comments: C-collar in place Cardiovascular:     Rate and Rhythm: Normal rate and regular rhythm.     Pulses: Normal pulses.  Pulmonary:     Effort: Pulmonary effort is normal. No respiratory distress.     Comments: Respirations even and unlabored Musculoskeletal:        General: Normal range of motion.     Cervical back: Normal range of motion.  Skin:    General: Skin is warm and dry.     Coloration: Skin is not pale.     Findings: No erythema or rash.  Neurological:     Mental Status: He is alert.     Comments: Alert, moving all extremities spontaneously.  Psychiatric:        Behavior: Behavior normal.     ED Results / Procedures / Treatments   Labs (all labs ordered are listed, but only abnormal results are displayed) Labs Reviewed  DIFFERENTIAL - Abnormal; Notable for the following components:      Result Value   Abs Immature Granulocytes 0.08 (*)    All other components within normal limits  COMPREHENSIVE METABOLIC PANEL - Abnormal; Notable for the following components:   Creatinine, Ser 1.28 (*)    Calcium 8.4 (*)    Total Protein 5.7 (*)    Albumin 3.4 (*)    All other components within normal limits  MAGNESIUM - Abnormal; Notable for the following components:   Magnesium 2.5 (*)    All other components within normal limits  I-STAT CHEM 8, ED - Abnormal; Notable for the following components:   Calcium, Ion 1.06 (*)    All other components within normal limits  PROTIME-INR  APTT  CBC  ETHANOL  10-HYDROXYCARBAZEPINE  CBG MONITORING, ED    EKG EKG Interpretation Date/Time:  Tuesday December 13 2023 12:09:19 EDT Ventricular Rate:  78 PR Interval:  155 QRS Duration:  89 QT Interval:  378 QTC Calculation: 431 R Axis:   60  Text Interpretation: Sinus rhythm  Confirmed by Gerhard Munch [phone]) on 12/13/2023 3:07:09 PM  Radiology CT HEAD CODE STROKE WO CONTRAST Result Date: 12/13/2023 CLINICAL DATA:  Code stroke.  Aphasia.  Fall with head injury. EXAM: CT HEAD WITHOUT CONTRAST TECHNIQUE: Contiguous axial images were obtained from the base of the skull through the vertex without intravenous contrast. RADIATION DOSE REDUCTION: This exam was performed according to the departmental dose-optimization program which includes automated exposure control, adjustment of the mA and/or kV according to patient size and/or use of iterative reconstruction technique. COMPARISON:  09/29/2023 FINDINGS: Brain: Extensive dystrophic type calcification in the bilateral cerebellar hemispheres, brainstem, and occipital lobes  attributed to late sequela of prior radiotherapy. Prior suboccipital craniectomy with history of ependymoma treatment. There is a VP shunt from right posterior approach, tip unchanged at the left caudothalamic groove. No hydrocephalus. Evidence of chronic dural thickening or trace collection on both sides with mineralization on the left, unchanged. Supratentorial chronic white matter disease with chronic lacune at the medial right thalamus and bilateral brainstem. Vascular: No hyperdense vessel seen. Skull: Diffuse heterogeneity of calvarial density attributed to prior treatment. Cochlear implant on both sides with unavoidable artifact. Sinuses/Orbits: No acute finding Other: Prelim send in epic chat IMPRESSION: 1. No acute finding when compared to 09/29/2023. 2. Sequela of remote radiotherapy with advanced chronic small vessel disease and dystrophic calcification. 3. VP shunt with normal and stable ventricular volume. There is chronic dural thickening or small collections along the bilateral cerebral convexity. Electronically Signed   By: Tiburcio Pea M.D.   On: 12/13/2023 12:07    Procedures Procedures    Medications Ordered in ED Medications  acetaminophen  (TYLENOL) tablet 650 mg (has no administration in time range)    ED Course/ Medical Decision Making/ A&P Clinical Course as of 12/13/23 1451  Tue Dec 13, 2023  1225 Patient assessed by Dr. Amada Jupiter of Neurology. Plan for STAT EEG.  [KH]  1352 EEG currently being completed. [KH]  1357 Niece, April Stevens, can be reached at [phone] if necessary. [KH]  1440 Patient awake and alert. Speech clear. Answers questions appropriately. Transported for C-spine CT. Pending EEG results. [KH]  1450 Mild AKI with creatinine of 1.28 (baseline ~0.9) without other acute lab abnormalities. [KH]    Clinical Course User Index [KH] Antony Madura, PA-C                                 Medical Decision Making Amount and/or Complexity of Data Reviewed Labs: ordered. Radiology: ordered.  Risk OTC drugs.   This patient presents to the ED for concern of fall and head injury, this involves an extensive number of treatment options, and is a complaint that carries with it a high risk of complications and morbidity.  The differential diagnosis includes syncope vs seizure vs ICH vs CVA vs concussion vs contusion   Co morbidities that complicate the patient evaluation  CVA Posterior fossa tumor VP shunt placement Seizure d/o Hearing loss s/p cochlear implant   Additional history obtained:  Additional history obtained from Niece, via phone. External records from outside source obtained and reviewed including EEG in November 2024 which was WNL   Lab Tests:  I Ordered, and personally interpreted labs.  The pertinent results include:  Creatinine 1.28 (baseline ~0.9). Labs otherwise reassuring.   Imaging Studies ordered:  I ordered imaging studies including CT head w/o contrast  I independently visualized and interpreted imaging which showed no acute abnormality. Stable VP shunt. I agree with the radiologist interpretation   Cardiac Monitoring:  The patient was maintained on a cardiac  monitor.  I personally viewed and interpreted the cardiac monitored which showed an underlying rhythm of: NSR   Medicines ordered and prescription drug management:  I ordered medication including Tylenol for headache  Reevaluation of the patient after these medicines showed that the patient improved I have reviewed the patients home medicines and have made adjustments as needed   Test Considered:  MRI brain - not compatible w/cochlear implants   Consultations Obtained:  I requested consultation with Dr. Amada Jupiter of Neurology and discussed  lab and imaging findings as well as pertinent plan - they recommend: STAT EEG (pending) and lowering gabapentin dosing if work up and EEG reassuring.   Problem List / ED Course:  As above   Reevaluation:  After the interventions noted above, I reevaluated the patient and found that they have : remained stable   Social Determinants of Health:  Lives independently   Dispostion:  Care signed out to [place], [place] at shift change.          Final Clinical Impression(s) / ED Diagnoses Final diagnoses:  Altered mental status, unspecified altered mental status type  Injury of head, initial encounter    Rx / DC Orders ED Discharge Orders     None         Antony Madura, PA-C 12/13/23 1610    Gerhard Munch, MD 12/17/23 [phone]

## 2023-12-13 NOTE — Code Documentation (Signed)
 Stroke Response Nurse Documentation Code Documentation  [name] Love is a 53 y.o. male arriving to [facility]  via [facility] on 12/13/2023 with past medical hx of sizures, brain cancer, insomnia, ependymoma of brain, abnormal gaid, left leg weakness, bilateral arm weakness, hypothyroidism, hearing loss with cochlear implant, anxiety, depression, stroke, sleep apnea on SPAP, recurrent falls, and headache. On clopidogrel 75 mg daily. Code stroke was activated by EMS.   Patient from home where he was LKW at 1000 and the home health aide who was caring for his mother heard a noise at 1040. He had hit his head hard on the wall and was noted to be aphasic.   Stroke team at the bedside on patient arrival. Labs drawn and patient cleared for CT by EDP. Patient to CT with team. NIHSS 17, see documentation for details and code stroke times. Patient with disoriented, not following commands, bilateral arm weakness, bilateral leg weakness, Global aphasia , dysarthria , and Sensory  neglect (auditory) on exam. The following imaging was completed:  CT Head. Unable to obtain CTA due to contrast allergy. Patient is not a candidate for IV Thrombolytic due to subdural seen on CT head. Patient is not a candidate for IR due to LVO not suspected.   Care Plan: Q2 NIHSS and VS.   Bedside handoff with ED RN.    Ferman Hamming Stroke Response RN

## 2023-12-17 LAB — 10-HYDROXYCARBAZEPINE: Triliptal/MTB(Oxcarbazepin): 20 ug/mL (ref 10–35)

## 2023-12-19 ENCOUNTER — Telehealth: Payer: Self-pay | Admitting: Neurology

## 2023-12-19 DIAGNOSIS — R569 Unspecified convulsions: Secondary | ICD-10-CM | POA: Diagnosis not present

## 2023-12-19 DIAGNOSIS — R209 Unspecified disturbances of skin sensation: Secondary | ICD-10-CM | POA: Diagnosis not present

## 2023-12-19 DIAGNOSIS — I110 Hypertensive heart disease with heart failure: Secondary | ICD-10-CM | POA: Diagnosis not present

## 2023-12-19 DIAGNOSIS — H903 Sensorineural hearing loss, bilateral: Secondary | ICD-10-CM | POA: Diagnosis not present

## 2023-12-19 DIAGNOSIS — G4089 Other seizures: Secondary | ICD-10-CM | POA: Diagnosis not present

## 2023-12-19 DIAGNOSIS — R296 Repeated falls: Secondary | ICD-10-CM | POA: Diagnosis not present

## 2023-12-19 DIAGNOSIS — G40909 Epilepsy, unspecified, not intractable, without status epilepticus: Secondary | ICD-10-CM | POA: Diagnosis not present

## 2023-12-19 DIAGNOSIS — I69354 Hemiplegia and hemiparesis following cerebral infarction affecting left non-dominant side: Secondary | ICD-10-CM | POA: Diagnosis not present

## 2023-12-19 DIAGNOSIS — F32A Depression, unspecified: Secondary | ICD-10-CM | POA: Diagnosis not present

## 2023-12-19 DIAGNOSIS — M1990 Unspecified osteoarthritis, unspecified site: Secondary | ICD-10-CM | POA: Diagnosis not present

## 2023-12-19 DIAGNOSIS — G4700 Insomnia, unspecified: Secondary | ICD-10-CM | POA: Diagnosis not present

## 2023-12-19 DIAGNOSIS — R32 Unspecified urinary incontinence: Secondary | ICD-10-CM | POA: Diagnosis not present

## 2023-12-19 DIAGNOSIS — G4733 Obstructive sleep apnea (adult) (pediatric): Secondary | ICD-10-CM | POA: Diagnosis not present

## 2023-12-19 DIAGNOSIS — E039 Hypothyroidism, unspecified: Secondary | ICD-10-CM | POA: Diagnosis not present

## 2023-12-19 DIAGNOSIS — I509 Heart failure, unspecified: Secondary | ICD-10-CM | POA: Diagnosis not present

## 2023-12-19 DIAGNOSIS — Z7902 Long term (current) use of antithrombotics/antiplatelets: Secondary | ICD-10-CM | POA: Diagnosis not present

## 2023-12-19 DIAGNOSIS — F411 Generalized anxiety disorder: Secondary | ICD-10-CM | POA: Diagnosis not present

## 2023-12-19 DIAGNOSIS — I69398 Other sequelae of cerebral infarction: Secondary | ICD-10-CM | POA: Diagnosis not present

## 2023-12-19 LAB — CBG MONITORING, ED: Glucose-Capillary: 95 mg/dL (ref 70–99)

## 2023-12-19 NOTE — Telephone Encounter (Signed)
 Secure voicemail left with mark, verbal orders given as requested

## 2023-12-19 NOTE — Telephone Encounter (Signed)
 Mark from [facility] called needing VO for Home Health PT for  1X 1w 2x 5w 1x2w Please advise.

## 2023-12-19 NOTE — Telephone Encounter (Signed)
 Called and left verbal orders on secure line for  [employer] [phone]

## 2023-12-20 DIAGNOSIS — R569 Unspecified convulsions: Secondary | ICD-10-CM | POA: Diagnosis not present

## 2023-12-20 DIAGNOSIS — G40909 Epilepsy, unspecified, not intractable, without status epilepticus: Secondary | ICD-10-CM | POA: Diagnosis not present

## 2023-12-21 DIAGNOSIS — I69351 Hemiplegia and hemiparesis following cerebral infarction affecting right dominant side: Secondary | ICD-10-CM | POA: Diagnosis not present

## 2023-12-21 DIAGNOSIS — I6381 Other cerebral infarction due to occlusion or stenosis of small artery: Secondary | ICD-10-CM | POA: Diagnosis not present

## 2023-12-21 DIAGNOSIS — G40909 Epilepsy, unspecified, not intractable, without status epilepticus: Secondary | ICD-10-CM | POA: Diagnosis not present

## 2023-12-21 DIAGNOSIS — I6330 Cerebral infarction due to thrombosis of unspecified cerebral artery: Secondary | ICD-10-CM | POA: Diagnosis not present

## 2023-12-21 DIAGNOSIS — R569 Unspecified convulsions: Secondary | ICD-10-CM | POA: Diagnosis not present

## 2023-12-21 DIAGNOSIS — F445 Conversion disorder with seizures or convulsions: Secondary | ICD-10-CM | POA: Diagnosis not present

## 2023-12-22 DIAGNOSIS — R32 Unspecified urinary incontinence: Secondary | ICD-10-CM | POA: Diagnosis not present

## 2023-12-22 DIAGNOSIS — I69354 Hemiplegia and hemiparesis following cerebral infarction affecting left non-dominant side: Secondary | ICD-10-CM | POA: Diagnosis not present

## 2023-12-22 DIAGNOSIS — G4733 Obstructive sleep apnea (adult) (pediatric): Secondary | ICD-10-CM | POA: Diagnosis not present

## 2023-12-22 DIAGNOSIS — F32A Depression, unspecified: Secondary | ICD-10-CM | POA: Diagnosis not present

## 2023-12-22 DIAGNOSIS — F411 Generalized anxiety disorder: Secondary | ICD-10-CM | POA: Diagnosis not present

## 2023-12-22 DIAGNOSIS — G4700 Insomnia, unspecified: Secondary | ICD-10-CM | POA: Diagnosis not present

## 2023-12-22 DIAGNOSIS — M1990 Unspecified osteoarthritis, unspecified site: Secondary | ICD-10-CM | POA: Diagnosis not present

## 2023-12-22 DIAGNOSIS — R209 Unspecified disturbances of skin sensation: Secondary | ICD-10-CM | POA: Diagnosis not present

## 2023-12-22 DIAGNOSIS — E039 Hypothyroidism, unspecified: Secondary | ICD-10-CM | POA: Diagnosis not present

## 2023-12-22 DIAGNOSIS — R569 Unspecified convulsions: Secondary | ICD-10-CM

## 2023-12-22 DIAGNOSIS — G4089 Other seizures: Secondary | ICD-10-CM | POA: Diagnosis not present

## 2023-12-22 DIAGNOSIS — I509 Heart failure, unspecified: Secondary | ICD-10-CM | POA: Diagnosis not present

## 2023-12-22 DIAGNOSIS — I110 Hypertensive heart disease with heart failure: Secondary | ICD-10-CM | POA: Diagnosis not present

## 2023-12-22 DIAGNOSIS — Z7902 Long term (current) use of antithrombotics/antiplatelets: Secondary | ICD-10-CM | POA: Diagnosis not present

## 2023-12-22 DIAGNOSIS — I69398 Other sequelae of cerebral infarction: Secondary | ICD-10-CM | POA: Diagnosis not present

## 2023-12-22 DIAGNOSIS — R296 Repeated falls: Secondary | ICD-10-CM | POA: Diagnosis not present

## 2023-12-22 DIAGNOSIS — G40909 Epilepsy, unspecified, not intractable, without status epilepticus: Secondary | ICD-10-CM | POA: Diagnosis not present

## 2023-12-22 DIAGNOSIS — H903 Sensorineural hearing loss, bilateral: Secondary | ICD-10-CM | POA: Diagnosis not present

## 2023-12-24 DIAGNOSIS — I69354 Hemiplegia and hemiparesis following cerebral infarction affecting left non-dominant side: Secondary | ICD-10-CM | POA: Diagnosis not present

## 2023-12-24 DIAGNOSIS — R209 Unspecified disturbances of skin sensation: Secondary | ICD-10-CM | POA: Diagnosis not present

## 2023-12-24 DIAGNOSIS — F32A Depression, unspecified: Secondary | ICD-10-CM | POA: Diagnosis not present

## 2023-12-24 DIAGNOSIS — I509 Heart failure, unspecified: Secondary | ICD-10-CM | POA: Diagnosis not present

## 2023-12-24 DIAGNOSIS — H903 Sensorineural hearing loss, bilateral: Secondary | ICD-10-CM | POA: Diagnosis not present

## 2023-12-24 DIAGNOSIS — R32 Unspecified urinary incontinence: Secondary | ICD-10-CM | POA: Diagnosis not present

## 2023-12-24 DIAGNOSIS — I110 Hypertensive heart disease with heart failure: Secondary | ICD-10-CM | POA: Diagnosis not present

## 2023-12-24 DIAGNOSIS — M1990 Unspecified osteoarthritis, unspecified site: Secondary | ICD-10-CM | POA: Diagnosis not present

## 2023-12-24 DIAGNOSIS — G4089 Other seizures: Secondary | ICD-10-CM | POA: Diagnosis not present

## 2023-12-24 DIAGNOSIS — I69398 Other sequelae of cerebral infarction: Secondary | ICD-10-CM | POA: Diagnosis not present

## 2023-12-24 DIAGNOSIS — Z7902 Long term (current) use of antithrombotics/antiplatelets: Secondary | ICD-10-CM | POA: Diagnosis not present

## 2023-12-24 DIAGNOSIS — G4733 Obstructive sleep apnea (adult) (pediatric): Secondary | ICD-10-CM | POA: Diagnosis not present

## 2023-12-24 DIAGNOSIS — E039 Hypothyroidism, unspecified: Secondary | ICD-10-CM | POA: Diagnosis not present

## 2023-12-24 DIAGNOSIS — G4700 Insomnia, unspecified: Secondary | ICD-10-CM | POA: Diagnosis not present

## 2023-12-24 DIAGNOSIS — F411 Generalized anxiety disorder: Secondary | ICD-10-CM | POA: Diagnosis not present

## 2023-12-24 DIAGNOSIS — R296 Repeated falls: Secondary | ICD-10-CM | POA: Diagnosis not present

## 2023-12-27 ENCOUNTER — Other Ambulatory Visit: Payer: Self-pay | Admitting: Cardiology

## 2023-12-27 DIAGNOSIS — M1990 Unspecified osteoarthritis, unspecified site: Secondary | ICD-10-CM | POA: Diagnosis not present

## 2023-12-27 DIAGNOSIS — F411 Generalized anxiety disorder: Secondary | ICD-10-CM | POA: Diagnosis not present

## 2023-12-27 DIAGNOSIS — G4733 Obstructive sleep apnea (adult) (pediatric): Secondary | ICD-10-CM | POA: Diagnosis not present

## 2023-12-27 DIAGNOSIS — G4089 Other seizures: Secondary | ICD-10-CM | POA: Diagnosis not present

## 2023-12-27 DIAGNOSIS — H903 Sensorineural hearing loss, bilateral: Secondary | ICD-10-CM | POA: Diagnosis not present

## 2023-12-27 DIAGNOSIS — I110 Hypertensive heart disease with heart failure: Secondary | ICD-10-CM | POA: Diagnosis not present

## 2023-12-27 DIAGNOSIS — F32A Depression, unspecified: Secondary | ICD-10-CM | POA: Diagnosis not present

## 2023-12-27 DIAGNOSIS — R32 Unspecified urinary incontinence: Secondary | ICD-10-CM | POA: Diagnosis not present

## 2023-12-27 DIAGNOSIS — E039 Hypothyroidism, unspecified: Secondary | ICD-10-CM | POA: Diagnosis not present

## 2023-12-27 DIAGNOSIS — G4700 Insomnia, unspecified: Secondary | ICD-10-CM | POA: Diagnosis not present

## 2023-12-27 DIAGNOSIS — I509 Heart failure, unspecified: Secondary | ICD-10-CM | POA: Diagnosis not present

## 2023-12-27 DIAGNOSIS — Z7902 Long term (current) use of antithrombotics/antiplatelets: Secondary | ICD-10-CM | POA: Diagnosis not present

## 2023-12-27 DIAGNOSIS — R296 Repeated falls: Secondary | ICD-10-CM | POA: Diagnosis not present

## 2023-12-27 DIAGNOSIS — I69398 Other sequelae of cerebral infarction: Secondary | ICD-10-CM | POA: Diagnosis not present

## 2023-12-27 DIAGNOSIS — I69354 Hemiplegia and hemiparesis following cerebral infarction affecting left non-dominant side: Secondary | ICD-10-CM | POA: Diagnosis not present

## 2023-12-27 DIAGNOSIS — R209 Unspecified disturbances of skin sensation: Secondary | ICD-10-CM | POA: Diagnosis not present

## 2023-12-29 DIAGNOSIS — R32 Unspecified urinary incontinence: Secondary | ICD-10-CM | POA: Diagnosis not present

## 2023-12-29 DIAGNOSIS — G4700 Insomnia, unspecified: Secondary | ICD-10-CM | POA: Diagnosis not present

## 2023-12-29 DIAGNOSIS — I509 Heart failure, unspecified: Secondary | ICD-10-CM | POA: Diagnosis not present

## 2023-12-29 DIAGNOSIS — H903 Sensorineural hearing loss, bilateral: Secondary | ICD-10-CM | POA: Diagnosis not present

## 2023-12-29 DIAGNOSIS — I110 Hypertensive heart disease with heart failure: Secondary | ICD-10-CM | POA: Diagnosis not present

## 2023-12-29 DIAGNOSIS — I69398 Other sequelae of cerebral infarction: Secondary | ICD-10-CM | POA: Diagnosis not present

## 2023-12-29 DIAGNOSIS — F32A Depression, unspecified: Secondary | ICD-10-CM | POA: Diagnosis not present

## 2023-12-29 DIAGNOSIS — M1990 Unspecified osteoarthritis, unspecified site: Secondary | ICD-10-CM | POA: Diagnosis not present

## 2023-12-29 DIAGNOSIS — I69354 Hemiplegia and hemiparesis following cerebral infarction affecting left non-dominant side: Secondary | ICD-10-CM | POA: Diagnosis not present

## 2023-12-29 DIAGNOSIS — E039 Hypothyroidism, unspecified: Secondary | ICD-10-CM | POA: Diagnosis not present

## 2023-12-29 DIAGNOSIS — G4733 Obstructive sleep apnea (adult) (pediatric): Secondary | ICD-10-CM | POA: Diagnosis not present

## 2023-12-29 DIAGNOSIS — R209 Unspecified disturbances of skin sensation: Secondary | ICD-10-CM | POA: Diagnosis not present

## 2023-12-29 DIAGNOSIS — G4089 Other seizures: Secondary | ICD-10-CM | POA: Diagnosis not present

## 2023-12-29 DIAGNOSIS — Z7902 Long term (current) use of antithrombotics/antiplatelets: Secondary | ICD-10-CM | POA: Diagnosis not present

## 2023-12-29 DIAGNOSIS — R296 Repeated falls: Secondary | ICD-10-CM | POA: Diagnosis not present

## 2023-12-29 DIAGNOSIS — F411 Generalized anxiety disorder: Secondary | ICD-10-CM | POA: Diagnosis not present

## 2023-12-31 DIAGNOSIS — G4733 Obstructive sleep apnea (adult) (pediatric): Secondary | ICD-10-CM | POA: Diagnosis not present

## 2023-12-31 DIAGNOSIS — I69398 Other sequelae of cerebral infarction: Secondary | ICD-10-CM | POA: Diagnosis not present

## 2023-12-31 DIAGNOSIS — I110 Hypertensive heart disease with heart failure: Secondary | ICD-10-CM | POA: Diagnosis not present

## 2023-12-31 DIAGNOSIS — F32A Depression, unspecified: Secondary | ICD-10-CM | POA: Diagnosis not present

## 2023-12-31 DIAGNOSIS — R296 Repeated falls: Secondary | ICD-10-CM | POA: Diagnosis not present

## 2023-12-31 DIAGNOSIS — Z7902 Long term (current) use of antithrombotics/antiplatelets: Secondary | ICD-10-CM | POA: Diagnosis not present

## 2023-12-31 DIAGNOSIS — R209 Unspecified disturbances of skin sensation: Secondary | ICD-10-CM | POA: Diagnosis not present

## 2023-12-31 DIAGNOSIS — I69354 Hemiplegia and hemiparesis following cerebral infarction affecting left non-dominant side: Secondary | ICD-10-CM | POA: Diagnosis not present

## 2023-12-31 DIAGNOSIS — R32 Unspecified urinary incontinence: Secondary | ICD-10-CM | POA: Diagnosis not present

## 2023-12-31 DIAGNOSIS — F411 Generalized anxiety disorder: Secondary | ICD-10-CM | POA: Diagnosis not present

## 2023-12-31 DIAGNOSIS — M1990 Unspecified osteoarthritis, unspecified site: Secondary | ICD-10-CM | POA: Diagnosis not present

## 2023-12-31 DIAGNOSIS — G4089 Other seizures: Secondary | ICD-10-CM | POA: Diagnosis not present

## 2023-12-31 DIAGNOSIS — I509 Heart failure, unspecified: Secondary | ICD-10-CM | POA: Diagnosis not present

## 2023-12-31 DIAGNOSIS — G4700 Insomnia, unspecified: Secondary | ICD-10-CM | POA: Diagnosis not present

## 2023-12-31 DIAGNOSIS — E039 Hypothyroidism, unspecified: Secondary | ICD-10-CM | POA: Diagnosis not present

## 2023-12-31 DIAGNOSIS — H903 Sensorineural hearing loss, bilateral: Secondary | ICD-10-CM | POA: Diagnosis not present

## 2024-01-03 ENCOUNTER — Telehealth: Payer: Self-pay | Admitting: Neurology

## 2024-01-03 DIAGNOSIS — F411 Generalized anxiety disorder: Secondary | ICD-10-CM | POA: Diagnosis not present

## 2024-01-03 DIAGNOSIS — R296 Repeated falls: Secondary | ICD-10-CM | POA: Diagnosis not present

## 2024-01-03 DIAGNOSIS — G4700 Insomnia, unspecified: Secondary | ICD-10-CM | POA: Diagnosis not present

## 2024-01-03 DIAGNOSIS — M1990 Unspecified osteoarthritis, unspecified site: Secondary | ICD-10-CM | POA: Diagnosis not present

## 2024-01-03 DIAGNOSIS — I110 Hypertensive heart disease with heart failure: Secondary | ICD-10-CM | POA: Diagnosis not present

## 2024-01-03 DIAGNOSIS — I69398 Other sequelae of cerebral infarction: Secondary | ICD-10-CM | POA: Diagnosis not present

## 2024-01-03 DIAGNOSIS — G4733 Obstructive sleep apnea (adult) (pediatric): Secondary | ICD-10-CM | POA: Diagnosis not present

## 2024-01-03 DIAGNOSIS — E039 Hypothyroidism, unspecified: Secondary | ICD-10-CM | POA: Diagnosis not present

## 2024-01-03 DIAGNOSIS — Z7902 Long term (current) use of antithrombotics/antiplatelets: Secondary | ICD-10-CM | POA: Diagnosis not present

## 2024-01-03 DIAGNOSIS — G4089 Other seizures: Secondary | ICD-10-CM | POA: Diagnosis not present

## 2024-01-03 DIAGNOSIS — H903 Sensorineural hearing loss, bilateral: Secondary | ICD-10-CM | POA: Diagnosis not present

## 2024-01-03 DIAGNOSIS — R209 Unspecified disturbances of skin sensation: Secondary | ICD-10-CM | POA: Diagnosis not present

## 2024-01-03 DIAGNOSIS — F32A Depression, unspecified: Secondary | ICD-10-CM | POA: Diagnosis not present

## 2024-01-03 DIAGNOSIS — I69354 Hemiplegia and hemiparesis following cerebral infarction affecting left non-dominant side: Secondary | ICD-10-CM | POA: Diagnosis not present

## 2024-01-03 DIAGNOSIS — R32 Unspecified urinary incontinence: Secondary | ICD-10-CM | POA: Diagnosis not present

## 2024-01-03 DIAGNOSIS — I509 Heart failure, unspecified: Secondary | ICD-10-CM | POA: Diagnosis not present

## 2024-01-03 NOTE — Telephone Encounter (Signed)
 Pt is asking for a call with results to his EEG

## 2024-01-04 ENCOUNTER — Encounter (INDEPENDENT_AMBULATORY_CARE_PROVIDER_SITE_OTHER): Payer: Self-pay | Admitting: Neurology

## 2024-01-04 DIAGNOSIS — R569 Unspecified convulsions: Secondary | ICD-10-CM

## 2024-01-04 DIAGNOSIS — F4010 Social phobia, unspecified: Secondary | ICD-10-CM | POA: Diagnosis not present

## 2024-01-04 DIAGNOSIS — F39 Unspecified mood [affective] disorder: Secondary | ICD-10-CM | POA: Diagnosis not present

## 2024-01-04 DIAGNOSIS — F411 Generalized anxiety disorder: Secondary | ICD-10-CM | POA: Diagnosis not present

## 2024-01-04 NOTE — Procedures (Signed)
Clinical History : This is a 53 y/o F who presents with an event concerning for seizure. Patient reports her first episode was on December 14, 2022. She reports waking up at night, feeling nauseous, heavy and sweating. Around 2:30am her husband told her she was shaking really bad. The following day she had an event at school. Her principle told her she was seizing all over. She had an EEG that captured the event and it was deemed non epileptic and she was discharged without seizure medication.  INTERMITTENT MONITORING with VIDEO TECHNICAL SUMMARY: This AVEEG was performed using equipment provided by [employer] utilizing Bluetooth ( [employer] ) amplifiers with continuous EEGT attended video collection using encrypted remote transmission via [employer] secured cellular tower network with data rates for each AVEEG performed. This is a Therapist, music AVEEG, obtained, according to the 10-20 international electrode placement system, reformatted digitally into referential and bipolar montages. Data was acquired with a minimum of 21 bipolar connections and sampled at a minimum rate of 250 cycles per second per channel, maximum rate of 450 cycles per second per channel and two channels for EKG. The entire VEEG study was recorded through cable and or radio telemetry for subsequent analysis. Specified epochs of the AVEEG data were identified at the direction of the subject by the depression of a push button by the patient. Each patients event file included data acquired two minutes prior to the push button activation and continuing until two minutes afterwards. AVEEG files were reviewed on [employer] server, Licensed Software provided by [employer] with a digital high frequency filter set at 70 Hz and a low frequency filter set at 1 Hz with a paper speed of 36mm/s resulting in 10 seconds per digital page. This entire AVEEG was reviewed by the EEG Technologist. Random time samples, random sleep  samples, clips, patient initiated push button files with included patient daily diary logs, EEG Technologist pruned data was reviewed and verified for accuracy and validity by the governing reading neurologist in full details. This AEEGV was fully compliant with all requirements for CPT 97500 for setup, patient education, take down and administered by an EEG technologist. Long-Term EEG with Video was monitored intermittently by a qualified EEG technologist for the entirety of the recording; quality check-ins were performed at a minimum of every two hours, checking and documenting real-time data and video to assure the integrity and quality of the recording (e.g., camera position, electrode integrity and impedance), and identify the need for maintenance. For intermittent monitoring, an EEG Technologist monitored no more than 12 patients concurrently. Diagnostic video was captured at least 80% of the time during the recording.  PATIENT EVENTS: There were no patient events noted or captured during this recording.  TECHNOLOGIST EVENTS: No clear epileptiform activity was detected by the reviewing neurodiagnostic technologist during the recording for further evaluation.  TIME SAMPLES: 10-minutes of every 2 hours recorded are reviewed as random time samples.  SLEEP SAMPLES: 5-minutes of every 24 hours recorded are reviewed as random sleep samples.  AWAKE: At maximal level of alertness, the posterior dominant background activity was continuous, reactive, low voltage rhythm of 8.5 Hz. This was symmetric, well-modulated, and attenuated with eye opening. Diffuse, symmetric, frontocentral beta range activity was present.  SLEEP: N1 Sleep (Stage 1) was observed and characterized by the disappearance of alpha rhythm and the appearance of vertex activity. N2 Sleep (Stage 2) was observed and characterized by vertex waves, K-complexes, and sleep spindles. N3 (Stage 3) sleep was observed  and characterized by  high amplitude Delta activity of 20%. REM sleep was observed.  EKG: There were no arrhythmias or abnormalities noted during this recording.  Impression: This is a normal 72 hours ambulatory video EEG. There were no epileptiform discharges seen. There were no seizures or events captured. Normal EEG, however does not rule out epilepsy.   Windell Norfolk, MD [facility]

## 2024-01-04 NOTE — Telephone Encounter (Signed)
 Home health orders faxed to [facility]. [phone]

## 2024-01-05 ENCOUNTER — Encounter: Payer: Self-pay | Admitting: Neurology

## 2024-01-05 DIAGNOSIS — Z7902 Long term (current) use of antithrombotics/antiplatelets: Secondary | ICD-10-CM | POA: Diagnosis not present

## 2024-01-05 DIAGNOSIS — I69398 Other sequelae of cerebral infarction: Secondary | ICD-10-CM | POA: Diagnosis not present

## 2024-01-05 DIAGNOSIS — G4700 Insomnia, unspecified: Secondary | ICD-10-CM | POA: Diagnosis not present

## 2024-01-05 DIAGNOSIS — E039 Hypothyroidism, unspecified: Secondary | ICD-10-CM | POA: Diagnosis not present

## 2024-01-05 DIAGNOSIS — M1990 Unspecified osteoarthritis, unspecified site: Secondary | ICD-10-CM | POA: Diagnosis not present

## 2024-01-05 DIAGNOSIS — F32A Depression, unspecified: Secondary | ICD-10-CM | POA: Diagnosis not present

## 2024-01-05 DIAGNOSIS — R32 Unspecified urinary incontinence: Secondary | ICD-10-CM | POA: Diagnosis not present

## 2024-01-05 DIAGNOSIS — I509 Heart failure, unspecified: Secondary | ICD-10-CM | POA: Diagnosis not present

## 2024-01-05 DIAGNOSIS — H903 Sensorineural hearing loss, bilateral: Secondary | ICD-10-CM | POA: Diagnosis not present

## 2024-01-05 DIAGNOSIS — I110 Hypertensive heart disease with heart failure: Secondary | ICD-10-CM | POA: Diagnosis not present

## 2024-01-05 DIAGNOSIS — I69354 Hemiplegia and hemiparesis following cerebral infarction affecting left non-dominant side: Secondary | ICD-10-CM | POA: Diagnosis not present

## 2024-01-05 DIAGNOSIS — R209 Unspecified disturbances of skin sensation: Secondary | ICD-10-CM | POA: Diagnosis not present

## 2024-01-05 DIAGNOSIS — F411 Generalized anxiety disorder: Secondary | ICD-10-CM | POA: Diagnosis not present

## 2024-01-05 DIAGNOSIS — G4733 Obstructive sleep apnea (adult) (pediatric): Secondary | ICD-10-CM | POA: Diagnosis not present

## 2024-01-05 DIAGNOSIS — R296 Repeated falls: Secondary | ICD-10-CM | POA: Diagnosis not present

## 2024-01-05 DIAGNOSIS — G4089 Other seizures: Secondary | ICD-10-CM | POA: Diagnosis not present

## 2024-01-05 NOTE — Telephone Encounter (Signed)
 EEG completed.

## 2024-01-06 DIAGNOSIS — I69398 Other sequelae of cerebral infarction: Secondary | ICD-10-CM | POA: Diagnosis not present

## 2024-01-06 DIAGNOSIS — G4089 Other seizures: Secondary | ICD-10-CM | POA: Diagnosis not present

## 2024-01-06 DIAGNOSIS — H903 Sensorineural hearing loss, bilateral: Secondary | ICD-10-CM | POA: Diagnosis not present

## 2024-01-06 DIAGNOSIS — I509 Heart failure, unspecified: Secondary | ICD-10-CM | POA: Diagnosis not present

## 2024-01-06 DIAGNOSIS — R296 Repeated falls: Secondary | ICD-10-CM | POA: Diagnosis not present

## 2024-01-06 DIAGNOSIS — I69354 Hemiplegia and hemiparesis following cerebral infarction affecting left non-dominant side: Secondary | ICD-10-CM | POA: Diagnosis not present

## 2024-01-06 DIAGNOSIS — M1990 Unspecified osteoarthritis, unspecified site: Secondary | ICD-10-CM | POA: Diagnosis not present

## 2024-01-06 DIAGNOSIS — R209 Unspecified disturbances of skin sensation: Secondary | ICD-10-CM | POA: Diagnosis not present

## 2024-01-06 DIAGNOSIS — G4700 Insomnia, unspecified: Secondary | ICD-10-CM | POA: Diagnosis not present

## 2024-01-06 DIAGNOSIS — G4733 Obstructive sleep apnea (adult) (pediatric): Secondary | ICD-10-CM | POA: Diagnosis not present

## 2024-01-06 DIAGNOSIS — F32A Depression, unspecified: Secondary | ICD-10-CM | POA: Diagnosis not present

## 2024-01-06 DIAGNOSIS — I110 Hypertensive heart disease with heart failure: Secondary | ICD-10-CM | POA: Diagnosis not present

## 2024-01-06 DIAGNOSIS — E039 Hypothyroidism, unspecified: Secondary | ICD-10-CM | POA: Diagnosis not present

## 2024-01-06 DIAGNOSIS — F411 Generalized anxiety disorder: Secondary | ICD-10-CM | POA: Diagnosis not present

## 2024-01-06 DIAGNOSIS — Z7902 Long term (current) use of antithrombotics/antiplatelets: Secondary | ICD-10-CM | POA: Diagnosis not present

## 2024-01-06 DIAGNOSIS — R32 Unspecified urinary incontinence: Secondary | ICD-10-CM | POA: Diagnosis not present

## 2024-01-06 NOTE — Telephone Encounter (Signed)
 Call to patient to review results and move appt up and switch to Dr. Terrace Arabia. No answer. Left message to return call

## 2024-01-06 NOTE — Telephone Encounter (Signed)
 Please call the patient.  Ambulatory EEG showed intermittent left frontotemporal slowing.  Consistent with area of neuronal dysfunction in left frontotemporal region.  There was 1 pushbutton event that was not epileptic (described as headache, slurred speech, blurred vision without EEG changes)  He went to the ER 12/13/2023 for AMS following a fall.  I had increased his Trileptal 450 mg BID, gabapentin 600 mg TID (increased by another prescriber). In ER EEG showed mild to moderate diffuse encephalopathy.  No seizures.  Gabapentin was reduced 400 mg 3 times a day, felt causing sedation.  Trileptal level 20, sodium 139, creatinine 1.28.  CT head, cervical spine did not show any acute finding.  Would recommend he continue Trileptal 450 mg twice a day.  Continue to document and monitor spells.  If continues to have recurrent seizure spell please let us know.  Impression:  This is an abnormal ambulatory video EEG due to intermitted left frontotemporal slowing. This is consistent with an area of neuronal dysfunction in the left frontotemporal region. There was one event described as headaches, slurred speech, blurred vision with no changes in EEG background, hence non epileptic. There were no electrographic seizures and no epileptiform discharges seen during this recording.

## 2024-01-06 NOTE — Telephone Encounter (Signed)
 Patient returned call and verbalized understanding and in agreement to see to yan  and continue current mediations.

## 2024-01-10 DIAGNOSIS — I509 Heart failure, unspecified: Secondary | ICD-10-CM | POA: Diagnosis not present

## 2024-01-10 DIAGNOSIS — R32 Unspecified urinary incontinence: Secondary | ICD-10-CM | POA: Diagnosis not present

## 2024-01-10 DIAGNOSIS — Z7902 Long term (current) use of antithrombotics/antiplatelets: Secondary | ICD-10-CM | POA: Diagnosis not present

## 2024-01-10 DIAGNOSIS — Z556 Problems related to health literacy: Secondary | ICD-10-CM | POA: Diagnosis not present

## 2024-01-10 DIAGNOSIS — R209 Unspecified disturbances of skin sensation: Secondary | ICD-10-CM | POA: Diagnosis not present

## 2024-01-10 DIAGNOSIS — G4089 Other seizures: Secondary | ICD-10-CM | POA: Diagnosis not present

## 2024-01-10 DIAGNOSIS — M1990 Unspecified osteoarthritis, unspecified site: Secondary | ICD-10-CM | POA: Diagnosis not present

## 2024-01-10 DIAGNOSIS — F411 Generalized anxiety disorder: Secondary | ICD-10-CM | POA: Diagnosis not present

## 2024-01-10 DIAGNOSIS — F32A Depression, unspecified: Secondary | ICD-10-CM | POA: Diagnosis not present

## 2024-01-10 DIAGNOSIS — H903 Sensorineural hearing loss, bilateral: Secondary | ICD-10-CM | POA: Diagnosis not present

## 2024-01-10 DIAGNOSIS — I69354 Hemiplegia and hemiparesis following cerebral infarction affecting left non-dominant side: Secondary | ICD-10-CM | POA: Diagnosis not present

## 2024-01-10 DIAGNOSIS — Z993 Dependence on wheelchair: Secondary | ICD-10-CM | POA: Diagnosis not present

## 2024-01-10 DIAGNOSIS — G4733 Obstructive sleep apnea (adult) (pediatric): Secondary | ICD-10-CM | POA: Diagnosis not present

## 2024-01-10 DIAGNOSIS — E039 Hypothyroidism, unspecified: Secondary | ICD-10-CM | POA: Diagnosis not present

## 2024-01-10 DIAGNOSIS — Z85841 Personal history of malignant neoplasm of brain: Secondary | ICD-10-CM | POA: Diagnosis not present

## 2024-01-10 DIAGNOSIS — I110 Hypertensive heart disease with heart failure: Secondary | ICD-10-CM | POA: Diagnosis not present

## 2024-01-10 DIAGNOSIS — G4700 Insomnia, unspecified: Secondary | ICD-10-CM | POA: Diagnosis not present

## 2024-01-10 DIAGNOSIS — I69398 Other sequelae of cerebral infarction: Secondary | ICD-10-CM | POA: Diagnosis not present

## 2024-01-10 DIAGNOSIS — R296 Repeated falls: Secondary | ICD-10-CM | POA: Diagnosis not present

## 2024-01-16 NOTE — Telephone Encounter (Signed)
 Received communication from [facility] stating that pt had missed a PT appointment on 01/12/2024.

## 2024-01-17 DIAGNOSIS — R209 Unspecified disturbances of skin sensation: Secondary | ICD-10-CM | POA: Diagnosis not present

## 2024-01-17 DIAGNOSIS — F411 Generalized anxiety disorder: Secondary | ICD-10-CM | POA: Diagnosis not present

## 2024-01-17 DIAGNOSIS — I69354 Hemiplegia and hemiparesis following cerebral infarction affecting left non-dominant side: Secondary | ICD-10-CM | POA: Diagnosis not present

## 2024-01-17 DIAGNOSIS — E039 Hypothyroidism, unspecified: Secondary | ICD-10-CM | POA: Diagnosis not present

## 2024-01-17 DIAGNOSIS — F32A Depression, unspecified: Secondary | ICD-10-CM | POA: Diagnosis not present

## 2024-01-17 DIAGNOSIS — I509 Heart failure, unspecified: Secondary | ICD-10-CM | POA: Diagnosis not present

## 2024-01-17 DIAGNOSIS — Z7902 Long term (current) use of antithrombotics/antiplatelets: Secondary | ICD-10-CM | POA: Diagnosis not present

## 2024-01-17 DIAGNOSIS — I110 Hypertensive heart disease with heart failure: Secondary | ICD-10-CM | POA: Diagnosis not present

## 2024-01-17 DIAGNOSIS — G4733 Obstructive sleep apnea (adult) (pediatric): Secondary | ICD-10-CM | POA: Diagnosis not present

## 2024-01-17 DIAGNOSIS — I69398 Other sequelae of cerebral infarction: Secondary | ICD-10-CM | POA: Diagnosis not present

## 2024-01-17 DIAGNOSIS — G4089 Other seizures: Secondary | ICD-10-CM | POA: Diagnosis not present

## 2024-01-17 DIAGNOSIS — H903 Sensorineural hearing loss, bilateral: Secondary | ICD-10-CM | POA: Diagnosis not present

## 2024-01-17 DIAGNOSIS — M1990 Unspecified osteoarthritis, unspecified site: Secondary | ICD-10-CM | POA: Diagnosis not present

## 2024-01-17 DIAGNOSIS — R32 Unspecified urinary incontinence: Secondary | ICD-10-CM | POA: Diagnosis not present

## 2024-01-17 DIAGNOSIS — G4700 Insomnia, unspecified: Secondary | ICD-10-CM | POA: Diagnosis not present

## 2024-01-17 DIAGNOSIS — R296 Repeated falls: Secondary | ICD-10-CM | POA: Diagnosis not present

## 2024-01-19 DIAGNOSIS — G4089 Other seizures: Secondary | ICD-10-CM | POA: Diagnosis not present

## 2024-01-19 DIAGNOSIS — R32 Unspecified urinary incontinence: Secondary | ICD-10-CM | POA: Diagnosis not present

## 2024-01-19 DIAGNOSIS — I509 Heart failure, unspecified: Secondary | ICD-10-CM | POA: Diagnosis not present

## 2024-01-19 DIAGNOSIS — H903 Sensorineural hearing loss, bilateral: Secondary | ICD-10-CM | POA: Diagnosis not present

## 2024-01-19 DIAGNOSIS — F32A Depression, unspecified: Secondary | ICD-10-CM | POA: Diagnosis not present

## 2024-01-19 DIAGNOSIS — M1990 Unspecified osteoarthritis, unspecified site: Secondary | ICD-10-CM | POA: Diagnosis not present

## 2024-01-19 DIAGNOSIS — I69354 Hemiplegia and hemiparesis following cerebral infarction affecting left non-dominant side: Secondary | ICD-10-CM | POA: Diagnosis not present

## 2024-01-19 DIAGNOSIS — Z7902 Long term (current) use of antithrombotics/antiplatelets: Secondary | ICD-10-CM | POA: Diagnosis not present

## 2024-01-19 DIAGNOSIS — G4700 Insomnia, unspecified: Secondary | ICD-10-CM | POA: Diagnosis not present

## 2024-01-19 DIAGNOSIS — F411 Generalized anxiety disorder: Secondary | ICD-10-CM | POA: Diagnosis not present

## 2024-01-19 DIAGNOSIS — R209 Unspecified disturbances of skin sensation: Secondary | ICD-10-CM | POA: Diagnosis not present

## 2024-01-19 DIAGNOSIS — R296 Repeated falls: Secondary | ICD-10-CM | POA: Diagnosis not present

## 2024-01-19 DIAGNOSIS — I110 Hypertensive heart disease with heart failure: Secondary | ICD-10-CM | POA: Diagnosis not present

## 2024-01-19 DIAGNOSIS — G4733 Obstructive sleep apnea (adult) (pediatric): Secondary | ICD-10-CM | POA: Diagnosis not present

## 2024-01-19 DIAGNOSIS — E039 Hypothyroidism, unspecified: Secondary | ICD-10-CM | POA: Diagnosis not present

## 2024-01-19 DIAGNOSIS — I69398 Other sequelae of cerebral infarction: Secondary | ICD-10-CM | POA: Diagnosis not present

## 2024-01-21 DIAGNOSIS — I69351 Hemiplegia and hemiparesis following cerebral infarction affecting right dominant side: Secondary | ICD-10-CM | POA: Diagnosis not present

## 2024-01-21 DIAGNOSIS — F445 Conversion disorder with seizures or convulsions: Secondary | ICD-10-CM | POA: Diagnosis not present

## 2024-01-21 DIAGNOSIS — I6330 Cerebral infarction due to thrombosis of unspecified cerebral artery: Secondary | ICD-10-CM | POA: Diagnosis not present

## 2024-01-24 DIAGNOSIS — F32A Depression, unspecified: Secondary | ICD-10-CM | POA: Diagnosis not present

## 2024-01-24 DIAGNOSIS — G4700 Insomnia, unspecified: Secondary | ICD-10-CM | POA: Diagnosis not present

## 2024-01-24 DIAGNOSIS — I110 Hypertensive heart disease with heart failure: Secondary | ICD-10-CM | POA: Diagnosis not present

## 2024-01-24 DIAGNOSIS — G4089 Other seizures: Secondary | ICD-10-CM | POA: Diagnosis not present

## 2024-01-24 DIAGNOSIS — M1990 Unspecified osteoarthritis, unspecified site: Secondary | ICD-10-CM | POA: Diagnosis not present

## 2024-01-24 DIAGNOSIS — G4733 Obstructive sleep apnea (adult) (pediatric): Secondary | ICD-10-CM | POA: Diagnosis not present

## 2024-01-24 DIAGNOSIS — I69398 Other sequelae of cerebral infarction: Secondary | ICD-10-CM | POA: Diagnosis not present

## 2024-01-24 DIAGNOSIS — I509 Heart failure, unspecified: Secondary | ICD-10-CM | POA: Diagnosis not present

## 2024-01-24 DIAGNOSIS — R296 Repeated falls: Secondary | ICD-10-CM | POA: Diagnosis not present

## 2024-01-24 DIAGNOSIS — R32 Unspecified urinary incontinence: Secondary | ICD-10-CM | POA: Diagnosis not present

## 2024-01-24 DIAGNOSIS — E039 Hypothyroidism, unspecified: Secondary | ICD-10-CM | POA: Diagnosis not present

## 2024-01-24 DIAGNOSIS — H903 Sensorineural hearing loss, bilateral: Secondary | ICD-10-CM | POA: Diagnosis not present

## 2024-01-24 DIAGNOSIS — F411 Generalized anxiety disorder: Secondary | ICD-10-CM | POA: Diagnosis not present

## 2024-01-24 DIAGNOSIS — Z7902 Long term (current) use of antithrombotics/antiplatelets: Secondary | ICD-10-CM | POA: Diagnosis not present

## 2024-01-24 DIAGNOSIS — R209 Unspecified disturbances of skin sensation: Secondary | ICD-10-CM | POA: Diagnosis not present

## 2024-01-24 DIAGNOSIS — I69354 Hemiplegia and hemiparesis following cerebral infarction affecting left non-dominant side: Secondary | ICD-10-CM | POA: Diagnosis not present

## 2024-01-26 DIAGNOSIS — R32 Unspecified urinary incontinence: Secondary | ICD-10-CM | POA: Diagnosis not present

## 2024-01-26 DIAGNOSIS — F411 Generalized anxiety disorder: Secondary | ICD-10-CM | POA: Diagnosis not present

## 2024-01-26 DIAGNOSIS — E039 Hypothyroidism, unspecified: Secondary | ICD-10-CM | POA: Diagnosis not present

## 2024-01-26 DIAGNOSIS — I509 Heart failure, unspecified: Secondary | ICD-10-CM | POA: Diagnosis not present

## 2024-01-26 DIAGNOSIS — I110 Hypertensive heart disease with heart failure: Secondary | ICD-10-CM | POA: Diagnosis not present

## 2024-01-26 DIAGNOSIS — Z7902 Long term (current) use of antithrombotics/antiplatelets: Secondary | ICD-10-CM | POA: Diagnosis not present

## 2024-01-26 DIAGNOSIS — I69398 Other sequelae of cerebral infarction: Secondary | ICD-10-CM | POA: Diagnosis not present

## 2024-01-26 DIAGNOSIS — I69354 Hemiplegia and hemiparesis following cerebral infarction affecting left non-dominant side: Secondary | ICD-10-CM | POA: Diagnosis not present

## 2024-01-26 DIAGNOSIS — G4700 Insomnia, unspecified: Secondary | ICD-10-CM | POA: Diagnosis not present

## 2024-01-26 DIAGNOSIS — H903 Sensorineural hearing loss, bilateral: Secondary | ICD-10-CM | POA: Diagnosis not present

## 2024-01-26 DIAGNOSIS — G4089 Other seizures: Secondary | ICD-10-CM | POA: Diagnosis not present

## 2024-01-26 DIAGNOSIS — G4733 Obstructive sleep apnea (adult) (pediatric): Secondary | ICD-10-CM | POA: Diagnosis not present

## 2024-01-26 DIAGNOSIS — R296 Repeated falls: Secondary | ICD-10-CM | POA: Diagnosis not present

## 2024-01-26 DIAGNOSIS — M1990 Unspecified osteoarthritis, unspecified site: Secondary | ICD-10-CM | POA: Diagnosis not present

## 2024-01-26 DIAGNOSIS — F32A Depression, unspecified: Secondary | ICD-10-CM | POA: Diagnosis not present

## 2024-01-26 DIAGNOSIS — R209 Unspecified disturbances of skin sensation: Secondary | ICD-10-CM | POA: Diagnosis not present

## 2024-01-31 DIAGNOSIS — F411 Generalized anxiety disorder: Secondary | ICD-10-CM | POA: Diagnosis not present

## 2024-01-31 DIAGNOSIS — I69354 Hemiplegia and hemiparesis following cerebral infarction affecting left non-dominant side: Secondary | ICD-10-CM | POA: Diagnosis not present

## 2024-01-31 DIAGNOSIS — I110 Hypertensive heart disease with heart failure: Secondary | ICD-10-CM | POA: Diagnosis not present

## 2024-01-31 DIAGNOSIS — G4089 Other seizures: Secondary | ICD-10-CM | POA: Diagnosis not present

## 2024-01-31 DIAGNOSIS — R296 Repeated falls: Secondary | ICD-10-CM | POA: Diagnosis not present

## 2024-01-31 DIAGNOSIS — G4733 Obstructive sleep apnea (adult) (pediatric): Secondary | ICD-10-CM | POA: Diagnosis not present

## 2024-01-31 DIAGNOSIS — M1990 Unspecified osteoarthritis, unspecified site: Secondary | ICD-10-CM | POA: Diagnosis not present

## 2024-01-31 DIAGNOSIS — E039 Hypothyroidism, unspecified: Secondary | ICD-10-CM | POA: Diagnosis not present

## 2024-01-31 DIAGNOSIS — Z7902 Long term (current) use of antithrombotics/antiplatelets: Secondary | ICD-10-CM | POA: Diagnosis not present

## 2024-01-31 DIAGNOSIS — G4700 Insomnia, unspecified: Secondary | ICD-10-CM | POA: Diagnosis not present

## 2024-01-31 DIAGNOSIS — F32A Depression, unspecified: Secondary | ICD-10-CM | POA: Diagnosis not present

## 2024-01-31 DIAGNOSIS — I69398 Other sequelae of cerebral infarction: Secondary | ICD-10-CM | POA: Diagnosis not present

## 2024-01-31 DIAGNOSIS — R209 Unspecified disturbances of skin sensation: Secondary | ICD-10-CM | POA: Diagnosis not present

## 2024-01-31 DIAGNOSIS — I509 Heart failure, unspecified: Secondary | ICD-10-CM | POA: Diagnosis not present

## 2024-01-31 DIAGNOSIS — R32 Unspecified urinary incontinence: Secondary | ICD-10-CM | POA: Diagnosis not present

## 2024-01-31 DIAGNOSIS — H903 Sensorineural hearing loss, bilateral: Secondary | ICD-10-CM | POA: Diagnosis not present

## 2024-02-02 DIAGNOSIS — Z7902 Long term (current) use of antithrombotics/antiplatelets: Secondary | ICD-10-CM | POA: Diagnosis not present

## 2024-02-02 DIAGNOSIS — R296 Repeated falls: Secondary | ICD-10-CM | POA: Diagnosis not present

## 2024-02-02 DIAGNOSIS — H903 Sensorineural hearing loss, bilateral: Secondary | ICD-10-CM | POA: Diagnosis not present

## 2024-02-02 DIAGNOSIS — G4700 Insomnia, unspecified: Secondary | ICD-10-CM | POA: Diagnosis not present

## 2024-02-02 DIAGNOSIS — I69354 Hemiplegia and hemiparesis following cerebral infarction affecting left non-dominant side: Secondary | ICD-10-CM | POA: Diagnosis not present

## 2024-02-02 DIAGNOSIS — R32 Unspecified urinary incontinence: Secondary | ICD-10-CM | POA: Diagnosis not present

## 2024-02-02 DIAGNOSIS — Z85841 Personal history of malignant neoplasm of brain: Secondary | ICD-10-CM | POA: Diagnosis not present

## 2024-02-02 DIAGNOSIS — I509 Heart failure, unspecified: Secondary | ICD-10-CM | POA: Diagnosis not present

## 2024-02-02 DIAGNOSIS — M1990 Unspecified osteoarthritis, unspecified site: Secondary | ICD-10-CM | POA: Diagnosis not present

## 2024-02-02 DIAGNOSIS — I110 Hypertensive heart disease with heart failure: Secondary | ICD-10-CM | POA: Diagnosis not present

## 2024-02-02 DIAGNOSIS — G4733 Obstructive sleep apnea (adult) (pediatric): Secondary | ICD-10-CM | POA: Diagnosis not present

## 2024-02-02 DIAGNOSIS — Z556 Problems related to health literacy: Secondary | ICD-10-CM | POA: Diagnosis not present

## 2024-02-02 DIAGNOSIS — F32A Depression, unspecified: Secondary | ICD-10-CM | POA: Diagnosis not present

## 2024-02-02 DIAGNOSIS — Z993 Dependence on wheelchair: Secondary | ICD-10-CM | POA: Diagnosis not present

## 2024-02-02 DIAGNOSIS — E039 Hypothyroidism, unspecified: Secondary | ICD-10-CM | POA: Diagnosis not present

## 2024-02-02 DIAGNOSIS — G4089 Other seizures: Secondary | ICD-10-CM | POA: Diagnosis not present

## 2024-02-02 DIAGNOSIS — F411 Generalized anxiety disorder: Secondary | ICD-10-CM | POA: Diagnosis not present

## 2024-02-02 DIAGNOSIS — I69398 Other sequelae of cerebral infarction: Secondary | ICD-10-CM | POA: Diagnosis not present

## 2024-02-02 DIAGNOSIS — R209 Unspecified disturbances of skin sensation: Secondary | ICD-10-CM | POA: Diagnosis not present

## 2024-02-07 DIAGNOSIS — Z993 Dependence on wheelchair: Secondary | ICD-10-CM | POA: Diagnosis not present

## 2024-02-07 DIAGNOSIS — I509 Heart failure, unspecified: Secondary | ICD-10-CM | POA: Diagnosis not present

## 2024-02-07 DIAGNOSIS — M1990 Unspecified osteoarthritis, unspecified site: Secondary | ICD-10-CM | POA: Diagnosis not present

## 2024-02-07 DIAGNOSIS — I110 Hypertensive heart disease with heart failure: Secondary | ICD-10-CM | POA: Diagnosis not present

## 2024-02-07 DIAGNOSIS — H903 Sensorineural hearing loss, bilateral: Secondary | ICD-10-CM | POA: Diagnosis not present

## 2024-02-07 DIAGNOSIS — G4733 Obstructive sleep apnea (adult) (pediatric): Secondary | ICD-10-CM | POA: Diagnosis not present

## 2024-02-07 DIAGNOSIS — R32 Unspecified urinary incontinence: Secondary | ICD-10-CM | POA: Diagnosis not present

## 2024-02-07 DIAGNOSIS — G4700 Insomnia, unspecified: Secondary | ICD-10-CM | POA: Diagnosis not present

## 2024-02-07 DIAGNOSIS — Z7902 Long term (current) use of antithrombotics/antiplatelets: Secondary | ICD-10-CM | POA: Diagnosis not present

## 2024-02-07 DIAGNOSIS — Z85841 Personal history of malignant neoplasm of brain: Secondary | ICD-10-CM | POA: Diagnosis not present

## 2024-02-07 DIAGNOSIS — E039 Hypothyroidism, unspecified: Secondary | ICD-10-CM | POA: Diagnosis not present

## 2024-02-07 DIAGNOSIS — I69398 Other sequelae of cerebral infarction: Secondary | ICD-10-CM | POA: Diagnosis not present

## 2024-02-07 DIAGNOSIS — R296 Repeated falls: Secondary | ICD-10-CM | POA: Diagnosis not present

## 2024-02-07 DIAGNOSIS — I69354 Hemiplegia and hemiparesis following cerebral infarction affecting left non-dominant side: Secondary | ICD-10-CM | POA: Diagnosis not present

## 2024-02-07 DIAGNOSIS — G4089 Other seizures: Secondary | ICD-10-CM | POA: Diagnosis not present

## 2024-02-07 DIAGNOSIS — Z556 Problems related to health literacy: Secondary | ICD-10-CM | POA: Diagnosis not present

## 2024-02-07 DIAGNOSIS — F32A Depression, unspecified: Secondary | ICD-10-CM | POA: Diagnosis not present

## 2024-02-07 DIAGNOSIS — F411 Generalized anxiety disorder: Secondary | ICD-10-CM | POA: Diagnosis not present

## 2024-02-07 DIAGNOSIS — R209 Unspecified disturbances of skin sensation: Secondary | ICD-10-CM | POA: Diagnosis not present

## 2024-02-08 ENCOUNTER — Telehealth: Payer: Self-pay | Admitting: Neurology

## 2024-02-08 NOTE — Telephone Encounter (Signed)
 [name] Love, PT w/ [facility]  has called for verbal orders 1 week 8 starting 02-12-24

## 2024-02-09 DIAGNOSIS — R296 Repeated falls: Secondary | ICD-10-CM | POA: Diagnosis not present

## 2024-02-09 DIAGNOSIS — I69354 Hemiplegia and hemiparesis following cerebral infarction affecting left non-dominant side: Secondary | ICD-10-CM | POA: Diagnosis not present

## 2024-02-09 DIAGNOSIS — I509 Heart failure, unspecified: Secondary | ICD-10-CM | POA: Diagnosis not present

## 2024-02-09 DIAGNOSIS — R209 Unspecified disturbances of skin sensation: Secondary | ICD-10-CM | POA: Diagnosis not present

## 2024-02-09 DIAGNOSIS — Z85841 Personal history of malignant neoplasm of brain: Secondary | ICD-10-CM | POA: Diagnosis not present

## 2024-02-09 DIAGNOSIS — F411 Generalized anxiety disorder: Secondary | ICD-10-CM | POA: Diagnosis not present

## 2024-02-09 DIAGNOSIS — I110 Hypertensive heart disease with heart failure: Secondary | ICD-10-CM | POA: Diagnosis not present

## 2024-02-09 DIAGNOSIS — Z7902 Long term (current) use of antithrombotics/antiplatelets: Secondary | ICD-10-CM | POA: Diagnosis not present

## 2024-02-09 DIAGNOSIS — G4089 Other seizures: Secondary | ICD-10-CM | POA: Diagnosis not present

## 2024-02-09 DIAGNOSIS — H903 Sensorineural hearing loss, bilateral: Secondary | ICD-10-CM | POA: Diagnosis not present

## 2024-02-09 DIAGNOSIS — G4700 Insomnia, unspecified: Secondary | ICD-10-CM | POA: Diagnosis not present

## 2024-02-09 DIAGNOSIS — E039 Hypothyroidism, unspecified: Secondary | ICD-10-CM | POA: Diagnosis not present

## 2024-02-09 DIAGNOSIS — Z993 Dependence on wheelchair: Secondary | ICD-10-CM | POA: Diagnosis not present

## 2024-02-09 DIAGNOSIS — F32A Depression, unspecified: Secondary | ICD-10-CM | POA: Diagnosis not present

## 2024-02-09 DIAGNOSIS — R32 Unspecified urinary incontinence: Secondary | ICD-10-CM | POA: Diagnosis not present

## 2024-02-09 DIAGNOSIS — Z556 Problems related to health literacy: Secondary | ICD-10-CM | POA: Diagnosis not present

## 2024-02-09 DIAGNOSIS — I69398 Other sequelae of cerebral infarction: Secondary | ICD-10-CM | POA: Diagnosis not present

## 2024-02-09 DIAGNOSIS — M1990 Unspecified osteoarthritis, unspecified site: Secondary | ICD-10-CM | POA: Diagnosis not present

## 2024-02-09 DIAGNOSIS — G4733 Obstructive sleep apnea (adult) (pediatric): Secondary | ICD-10-CM | POA: Diagnosis not present

## 2024-02-09 NOTE — Telephone Encounter (Signed)
 Lvm securely for verbal orders approved under slack np

## 2024-02-15 ENCOUNTER — Telehealth: Payer: Self-pay

## 2024-02-15 NOTE — Telephone Encounter (Signed)
 Orders sent for home health

## 2024-02-16 DIAGNOSIS — F32A Depression, unspecified: Secondary | ICD-10-CM | POA: Diagnosis not present

## 2024-02-16 DIAGNOSIS — Z85841 Personal history of malignant neoplasm of brain: Secondary | ICD-10-CM | POA: Diagnosis not present

## 2024-02-16 DIAGNOSIS — I110 Hypertensive heart disease with heart failure: Secondary | ICD-10-CM | POA: Diagnosis not present

## 2024-02-16 DIAGNOSIS — E039 Hypothyroidism, unspecified: Secondary | ICD-10-CM | POA: Diagnosis not present

## 2024-02-16 DIAGNOSIS — I509 Heart failure, unspecified: Secondary | ICD-10-CM | POA: Diagnosis not present

## 2024-02-16 DIAGNOSIS — I69398 Other sequelae of cerebral infarction: Secondary | ICD-10-CM | POA: Diagnosis not present

## 2024-02-16 DIAGNOSIS — R32 Unspecified urinary incontinence: Secondary | ICD-10-CM | POA: Diagnosis not present

## 2024-02-16 DIAGNOSIS — M5134 Other intervertebral disc degeneration, thoracic region: Secondary | ICD-10-CM | POA: Diagnosis not present

## 2024-02-16 DIAGNOSIS — M1990 Unspecified osteoarthritis, unspecified site: Secondary | ICD-10-CM | POA: Diagnosis not present

## 2024-02-16 DIAGNOSIS — I69354 Hemiplegia and hemiparesis following cerebral infarction affecting left non-dominant side: Secondary | ICD-10-CM | POA: Diagnosis not present

## 2024-02-16 DIAGNOSIS — Z993 Dependence on wheelchair: Secondary | ICD-10-CM | POA: Diagnosis not present

## 2024-02-16 DIAGNOSIS — Z7902 Long term (current) use of antithrombotics/antiplatelets: Secondary | ICD-10-CM | POA: Diagnosis not present

## 2024-02-16 DIAGNOSIS — R296 Repeated falls: Secondary | ICD-10-CM | POA: Diagnosis not present

## 2024-02-16 DIAGNOSIS — G4089 Other seizures: Secondary | ICD-10-CM | POA: Diagnosis not present

## 2024-02-16 DIAGNOSIS — F331 Major depressive disorder, recurrent, moderate: Secondary | ICD-10-CM | POA: Diagnosis not present

## 2024-02-16 DIAGNOSIS — F411 Generalized anxiety disorder: Secondary | ICD-10-CM | POA: Diagnosis not present

## 2024-02-16 DIAGNOSIS — H903 Sensorineural hearing loss, bilateral: Secondary | ICD-10-CM | POA: Diagnosis not present

## 2024-02-16 DIAGNOSIS — Z556 Problems related to health literacy: Secondary | ICD-10-CM | POA: Diagnosis not present

## 2024-02-16 DIAGNOSIS — G4733 Obstructive sleep apnea (adult) (pediatric): Secondary | ICD-10-CM | POA: Diagnosis not present

## 2024-02-16 DIAGNOSIS — R209 Unspecified disturbances of skin sensation: Secondary | ICD-10-CM | POA: Diagnosis not present

## 2024-02-16 DIAGNOSIS — G4700 Insomnia, unspecified: Secondary | ICD-10-CM | POA: Diagnosis not present

## 2024-02-20 DIAGNOSIS — F445 Conversion disorder with seizures or convulsions: Secondary | ICD-10-CM | POA: Diagnosis not present

## 2024-02-20 DIAGNOSIS — I69351 Hemiplegia and hemiparesis following cerebral infarction affecting right dominant side: Secondary | ICD-10-CM | POA: Diagnosis not present

## 2024-02-20 DIAGNOSIS — I6381 Other cerebral infarction due to occlusion or stenosis of small artery: Secondary | ICD-10-CM | POA: Diagnosis not present

## 2024-02-20 DIAGNOSIS — I6330 Cerebral infarction due to thrombosis of unspecified cerebral artery: Secondary | ICD-10-CM | POA: Diagnosis not present

## 2024-02-21 DIAGNOSIS — G4700 Insomnia, unspecified: Secondary | ICD-10-CM | POA: Diagnosis not present

## 2024-02-21 DIAGNOSIS — M1990 Unspecified osteoarthritis, unspecified site: Secondary | ICD-10-CM | POA: Diagnosis not present

## 2024-02-21 DIAGNOSIS — F32A Depression, unspecified: Secondary | ICD-10-CM | POA: Diagnosis not present

## 2024-02-21 DIAGNOSIS — I69398 Other sequelae of cerebral infarction: Secondary | ICD-10-CM | POA: Diagnosis not present

## 2024-02-21 DIAGNOSIS — E039 Hypothyroidism, unspecified: Secondary | ICD-10-CM | POA: Diagnosis not present

## 2024-02-21 DIAGNOSIS — Z993 Dependence on wheelchair: Secondary | ICD-10-CM | POA: Diagnosis not present

## 2024-02-21 DIAGNOSIS — R32 Unspecified urinary incontinence: Secondary | ICD-10-CM | POA: Diagnosis not present

## 2024-02-21 DIAGNOSIS — I110 Hypertensive heart disease with heart failure: Secondary | ICD-10-CM | POA: Diagnosis not present

## 2024-02-21 DIAGNOSIS — G4733 Obstructive sleep apnea (adult) (pediatric): Secondary | ICD-10-CM | POA: Diagnosis not present

## 2024-02-21 DIAGNOSIS — I69354 Hemiplegia and hemiparesis following cerebral infarction affecting left non-dominant side: Secondary | ICD-10-CM | POA: Diagnosis not present

## 2024-02-21 DIAGNOSIS — R296 Repeated falls: Secondary | ICD-10-CM | POA: Diagnosis not present

## 2024-02-21 DIAGNOSIS — G4089 Other seizures: Secondary | ICD-10-CM | POA: Diagnosis not present

## 2024-02-21 DIAGNOSIS — H903 Sensorineural hearing loss, bilateral: Secondary | ICD-10-CM | POA: Diagnosis not present

## 2024-02-21 DIAGNOSIS — Z556 Problems related to health literacy: Secondary | ICD-10-CM | POA: Diagnosis not present

## 2024-02-21 DIAGNOSIS — F411 Generalized anxiety disorder: Secondary | ICD-10-CM | POA: Diagnosis not present

## 2024-02-21 DIAGNOSIS — Z85841 Personal history of malignant neoplasm of brain: Secondary | ICD-10-CM | POA: Diagnosis not present

## 2024-02-21 DIAGNOSIS — I509 Heart failure, unspecified: Secondary | ICD-10-CM | POA: Diagnosis not present

## 2024-02-21 DIAGNOSIS — R209 Unspecified disturbances of skin sensation: Secondary | ICD-10-CM | POA: Diagnosis not present

## 2024-02-21 DIAGNOSIS — Z7902 Long term (current) use of antithrombotics/antiplatelets: Secondary | ICD-10-CM | POA: Diagnosis not present

## 2024-02-28 DIAGNOSIS — F39 Unspecified mood [affective] disorder: Secondary | ICD-10-CM | POA: Diagnosis not present

## 2024-02-28 DIAGNOSIS — F411 Generalized anxiety disorder: Secondary | ICD-10-CM | POA: Diagnosis not present

## 2024-02-28 DIAGNOSIS — F4010 Social phobia, unspecified: Secondary | ICD-10-CM | POA: Diagnosis not present

## 2024-03-01 DIAGNOSIS — H903 Sensorineural hearing loss, bilateral: Secondary | ICD-10-CM | POA: Diagnosis not present

## 2024-03-01 DIAGNOSIS — R32 Unspecified urinary incontinence: Secondary | ICD-10-CM | POA: Diagnosis not present

## 2024-03-01 DIAGNOSIS — R209 Unspecified disturbances of skin sensation: Secondary | ICD-10-CM | POA: Diagnosis not present

## 2024-03-01 DIAGNOSIS — I509 Heart failure, unspecified: Secondary | ICD-10-CM | POA: Diagnosis not present

## 2024-03-01 DIAGNOSIS — F32A Depression, unspecified: Secondary | ICD-10-CM | POA: Diagnosis not present

## 2024-03-01 DIAGNOSIS — F411 Generalized anxiety disorder: Secondary | ICD-10-CM | POA: Diagnosis not present

## 2024-03-01 DIAGNOSIS — I69398 Other sequelae of cerebral infarction: Secondary | ICD-10-CM | POA: Diagnosis not present

## 2024-03-01 DIAGNOSIS — G4700 Insomnia, unspecified: Secondary | ICD-10-CM | POA: Diagnosis not present

## 2024-03-01 DIAGNOSIS — R296 Repeated falls: Secondary | ICD-10-CM | POA: Diagnosis not present

## 2024-03-01 DIAGNOSIS — E039 Hypothyroidism, unspecified: Secondary | ICD-10-CM | POA: Diagnosis not present

## 2024-03-01 DIAGNOSIS — I69354 Hemiplegia and hemiparesis following cerebral infarction affecting left non-dominant side: Secondary | ICD-10-CM | POA: Diagnosis not present

## 2024-03-01 DIAGNOSIS — Z556 Problems related to health literacy: Secondary | ICD-10-CM | POA: Diagnosis not present

## 2024-03-01 DIAGNOSIS — M1990 Unspecified osteoarthritis, unspecified site: Secondary | ICD-10-CM | POA: Diagnosis not present

## 2024-03-01 DIAGNOSIS — I110 Hypertensive heart disease with heart failure: Secondary | ICD-10-CM | POA: Diagnosis not present

## 2024-03-01 DIAGNOSIS — Z993 Dependence on wheelchair: Secondary | ICD-10-CM | POA: Diagnosis not present

## 2024-03-01 DIAGNOSIS — G4733 Obstructive sleep apnea (adult) (pediatric): Secondary | ICD-10-CM | POA: Diagnosis not present

## 2024-03-01 DIAGNOSIS — Z7902 Long term (current) use of antithrombotics/antiplatelets: Secondary | ICD-10-CM | POA: Diagnosis not present

## 2024-03-01 DIAGNOSIS — G4089 Other seizures: Secondary | ICD-10-CM | POA: Diagnosis not present

## 2024-03-01 DIAGNOSIS — Z85841 Personal history of malignant neoplasm of brain: Secondary | ICD-10-CM | POA: Diagnosis not present

## 2024-03-09 DIAGNOSIS — I69354 Hemiplegia and hemiparesis following cerebral infarction affecting left non-dominant side: Secondary | ICD-10-CM | POA: Diagnosis not present

## 2024-03-09 DIAGNOSIS — G4089 Other seizures: Secondary | ICD-10-CM | POA: Diagnosis not present

## 2024-03-09 DIAGNOSIS — R209 Unspecified disturbances of skin sensation: Secondary | ICD-10-CM | POA: Diagnosis not present

## 2024-03-09 DIAGNOSIS — Z993 Dependence on wheelchair: Secondary | ICD-10-CM | POA: Diagnosis not present

## 2024-03-09 DIAGNOSIS — Z7902 Long term (current) use of antithrombotics/antiplatelets: Secondary | ICD-10-CM | POA: Diagnosis not present

## 2024-03-09 DIAGNOSIS — R32 Unspecified urinary incontinence: Secondary | ICD-10-CM | POA: Diagnosis not present

## 2024-03-09 DIAGNOSIS — F32A Depression, unspecified: Secondary | ICD-10-CM | POA: Diagnosis not present

## 2024-03-09 DIAGNOSIS — F411 Generalized anxiety disorder: Secondary | ICD-10-CM | POA: Diagnosis not present

## 2024-03-09 DIAGNOSIS — R296 Repeated falls: Secondary | ICD-10-CM | POA: Diagnosis not present

## 2024-03-09 DIAGNOSIS — I110 Hypertensive heart disease with heart failure: Secondary | ICD-10-CM | POA: Diagnosis not present

## 2024-03-09 DIAGNOSIS — M1990 Unspecified osteoarthritis, unspecified site: Secondary | ICD-10-CM | POA: Diagnosis not present

## 2024-03-09 DIAGNOSIS — Z85841 Personal history of malignant neoplasm of brain: Secondary | ICD-10-CM | POA: Diagnosis not present

## 2024-03-09 DIAGNOSIS — H903 Sensorineural hearing loss, bilateral: Secondary | ICD-10-CM | POA: Diagnosis not present

## 2024-03-09 DIAGNOSIS — G4700 Insomnia, unspecified: Secondary | ICD-10-CM | POA: Diagnosis not present

## 2024-03-09 DIAGNOSIS — I509 Heart failure, unspecified: Secondary | ICD-10-CM | POA: Diagnosis not present

## 2024-03-09 DIAGNOSIS — E039 Hypothyroidism, unspecified: Secondary | ICD-10-CM | POA: Diagnosis not present

## 2024-03-09 DIAGNOSIS — G4733 Obstructive sleep apnea (adult) (pediatric): Secondary | ICD-10-CM | POA: Diagnosis not present

## 2024-03-09 DIAGNOSIS — Z556 Problems related to health literacy: Secondary | ICD-10-CM | POA: Diagnosis not present

## 2024-03-09 DIAGNOSIS — I69398 Other sequelae of cerebral infarction: Secondary | ICD-10-CM | POA: Diagnosis not present

## 2024-03-10 DIAGNOSIS — I69354 Hemiplegia and hemiparesis following cerebral infarction affecting left non-dominant side: Secondary | ICD-10-CM | POA: Diagnosis not present

## 2024-03-10 DIAGNOSIS — E039 Hypothyroidism, unspecified: Secondary | ICD-10-CM | POA: Diagnosis not present

## 2024-03-10 DIAGNOSIS — R209 Unspecified disturbances of skin sensation: Secondary | ICD-10-CM | POA: Diagnosis not present

## 2024-03-10 DIAGNOSIS — Z993 Dependence on wheelchair: Secondary | ICD-10-CM | POA: Diagnosis not present

## 2024-03-10 DIAGNOSIS — F411 Generalized anxiety disorder: Secondary | ICD-10-CM | POA: Diagnosis not present

## 2024-03-10 DIAGNOSIS — R32 Unspecified urinary incontinence: Secondary | ICD-10-CM | POA: Diagnosis not present

## 2024-03-10 DIAGNOSIS — G4700 Insomnia, unspecified: Secondary | ICD-10-CM | POA: Diagnosis not present

## 2024-03-10 DIAGNOSIS — Z556 Problems related to health literacy: Secondary | ICD-10-CM | POA: Diagnosis not present

## 2024-03-10 DIAGNOSIS — R296 Repeated falls: Secondary | ICD-10-CM | POA: Diagnosis not present

## 2024-03-10 DIAGNOSIS — M1990 Unspecified osteoarthritis, unspecified site: Secondary | ICD-10-CM | POA: Diagnosis not present

## 2024-03-10 DIAGNOSIS — I110 Hypertensive heart disease with heart failure: Secondary | ICD-10-CM | POA: Diagnosis not present

## 2024-03-10 DIAGNOSIS — G4089 Other seizures: Secondary | ICD-10-CM | POA: Diagnosis not present

## 2024-03-10 DIAGNOSIS — Z7902 Long term (current) use of antithrombotics/antiplatelets: Secondary | ICD-10-CM | POA: Diagnosis not present

## 2024-03-10 DIAGNOSIS — F32A Depression, unspecified: Secondary | ICD-10-CM | POA: Diagnosis not present

## 2024-03-10 DIAGNOSIS — Z85841 Personal history of malignant neoplasm of brain: Secondary | ICD-10-CM | POA: Diagnosis not present

## 2024-03-10 DIAGNOSIS — I509 Heart failure, unspecified: Secondary | ICD-10-CM | POA: Diagnosis not present

## 2024-03-10 DIAGNOSIS — G4733 Obstructive sleep apnea (adult) (pediatric): Secondary | ICD-10-CM | POA: Diagnosis not present

## 2024-03-10 DIAGNOSIS — H903 Sensorineural hearing loss, bilateral: Secondary | ICD-10-CM | POA: Diagnosis not present

## 2024-03-10 DIAGNOSIS — I69398 Other sequelae of cerebral infarction: Secondary | ICD-10-CM | POA: Diagnosis not present

## 2024-03-12 DIAGNOSIS — F411 Generalized anxiety disorder: Secondary | ICD-10-CM | POA: Diagnosis not present

## 2024-03-12 DIAGNOSIS — R209 Unspecified disturbances of skin sensation: Secondary | ICD-10-CM | POA: Diagnosis not present

## 2024-03-12 DIAGNOSIS — E039 Hypothyroidism, unspecified: Secondary | ICD-10-CM | POA: Diagnosis not present

## 2024-03-12 DIAGNOSIS — G4089 Other seizures: Secondary | ICD-10-CM | POA: Diagnosis not present

## 2024-03-12 DIAGNOSIS — Z993 Dependence on wheelchair: Secondary | ICD-10-CM | POA: Diagnosis not present

## 2024-03-12 DIAGNOSIS — Z85841 Personal history of malignant neoplasm of brain: Secondary | ICD-10-CM | POA: Diagnosis not present

## 2024-03-12 DIAGNOSIS — M1990 Unspecified osteoarthritis, unspecified site: Secondary | ICD-10-CM | POA: Diagnosis not present

## 2024-03-12 DIAGNOSIS — H903 Sensorineural hearing loss, bilateral: Secondary | ICD-10-CM | POA: Diagnosis not present

## 2024-03-12 DIAGNOSIS — I69354 Hemiplegia and hemiparesis following cerebral infarction affecting left non-dominant side: Secondary | ICD-10-CM | POA: Diagnosis not present

## 2024-03-12 DIAGNOSIS — R32 Unspecified urinary incontinence: Secondary | ICD-10-CM | POA: Diagnosis not present

## 2024-03-12 DIAGNOSIS — F32A Depression, unspecified: Secondary | ICD-10-CM | POA: Diagnosis not present

## 2024-03-12 DIAGNOSIS — Z7902 Long term (current) use of antithrombotics/antiplatelets: Secondary | ICD-10-CM | POA: Diagnosis not present

## 2024-03-12 DIAGNOSIS — I509 Heart failure, unspecified: Secondary | ICD-10-CM | POA: Diagnosis not present

## 2024-03-12 DIAGNOSIS — I69398 Other sequelae of cerebral infarction: Secondary | ICD-10-CM | POA: Diagnosis not present

## 2024-03-12 DIAGNOSIS — G4700 Insomnia, unspecified: Secondary | ICD-10-CM | POA: Diagnosis not present

## 2024-03-12 DIAGNOSIS — I110 Hypertensive heart disease with heart failure: Secondary | ICD-10-CM | POA: Diagnosis not present

## 2024-03-12 DIAGNOSIS — G4733 Obstructive sleep apnea (adult) (pediatric): Secondary | ICD-10-CM | POA: Diagnosis not present

## 2024-03-12 DIAGNOSIS — R296 Repeated falls: Secondary | ICD-10-CM | POA: Diagnosis not present

## 2024-03-12 DIAGNOSIS — Z556 Problems related to health literacy: Secondary | ICD-10-CM | POA: Diagnosis not present

## 2024-03-14 ENCOUNTER — Encounter (HOSPITAL_COMMUNITY): Payer: Self-pay

## 2024-03-14 ENCOUNTER — Emergency Department (HOSPITAL_COMMUNITY)

## 2024-03-14 ENCOUNTER — Other Ambulatory Visit: Payer: Self-pay

## 2024-03-14 ENCOUNTER — Observation Stay (HOSPITAL_COMMUNITY)
Admission: EM | Admit: 2024-03-14 | Discharge: 2024-03-16 | Disposition: A | Discharge status: Home / Self Care | Attending: Internal Medicine | Admitting: Internal Medicine

## 2024-03-14 DIAGNOSIS — F32A Depression, unspecified: Secondary | ICD-10-CM | POA: Diagnosis present

## 2024-03-14 DIAGNOSIS — I10 Essential (primary) hypertension: Secondary | ICD-10-CM | POA: Diagnosis not present

## 2024-03-14 DIAGNOSIS — R296 Repeated falls: Secondary | ICD-10-CM | POA: Diagnosis not present

## 2024-03-14 DIAGNOSIS — G4733 Obstructive sleep apnea (adult) (pediatric): Secondary | ICD-10-CM

## 2024-03-14 DIAGNOSIS — R0902 Hypoxemia: Secondary | ICD-10-CM | POA: Diagnosis not present

## 2024-03-14 DIAGNOSIS — R569 Unspecified convulsions: Secondary | ICD-10-CM | POA: Diagnosis not present

## 2024-03-14 DIAGNOSIS — N390 Urinary tract infection, site not specified: Secondary | ICD-10-CM | POA: Insufficient documentation

## 2024-03-14 DIAGNOSIS — W19XXXA Unspecified fall, initial encounter: Secondary | ICD-10-CM | POA: Diagnosis not present

## 2024-03-14 DIAGNOSIS — G9389 Other specified disorders of brain: Secondary | ICD-10-CM | POA: Diagnosis not present

## 2024-03-14 DIAGNOSIS — G9340 Encephalopathy, unspecified: Secondary | ICD-10-CM | POA: Diagnosis not present

## 2024-03-14 DIAGNOSIS — M79605 Pain in left leg: Secondary | ICD-10-CM | POA: Diagnosis not present

## 2024-03-14 DIAGNOSIS — E538 Deficiency of other specified B group vitamins: Secondary | ICD-10-CM | POA: Insufficient documentation

## 2024-03-14 DIAGNOSIS — Z8673 Personal history of transient ischemic attack (TIA), and cerebral infarction without residual deficits: Secondary | ICD-10-CM

## 2024-03-14 DIAGNOSIS — M79604 Pain in right leg: Secondary | ICD-10-CM | POA: Diagnosis not present

## 2024-03-14 DIAGNOSIS — F411 Generalized anxiety disorder: Secondary | ICD-10-CM | POA: Diagnosis present

## 2024-03-14 DIAGNOSIS — R29818 Other symptoms and signs involving the nervous system: Secondary | ICD-10-CM | POA: Diagnosis not present

## 2024-03-14 DIAGNOSIS — Z79899 Other long term (current) drug therapy: Secondary | ICD-10-CM | POA: Diagnosis not present

## 2024-03-14 DIAGNOSIS — R4781 Slurred speech: Secondary | ICD-10-CM | POA: Diagnosis not present

## 2024-03-14 DIAGNOSIS — R531 Weakness: Secondary | ICD-10-CM | POA: Diagnosis not present

## 2024-03-14 DIAGNOSIS — I6782 Cerebral ischemia: Secondary | ICD-10-CM | POA: Diagnosis not present

## 2024-03-14 DIAGNOSIS — R29810 Facial weakness: Secondary | ICD-10-CM | POA: Diagnosis not present

## 2024-03-14 DIAGNOSIS — Z8679 Personal history of other diseases of the circulatory system: Secondary | ICD-10-CM | POA: Diagnosis not present

## 2024-03-14 DIAGNOSIS — Z452 Encounter for adjustment and management of vascular access device: Secondary | ICD-10-CM | POA: Diagnosis not present

## 2024-03-14 DIAGNOSIS — I4729 Other ventricular tachycardia: Secondary | ICD-10-CM | POA: Diagnosis present

## 2024-03-14 DIAGNOSIS — R4182 Altered mental status, unspecified: Secondary | ICD-10-CM | POA: Diagnosis not present

## 2024-03-14 DIAGNOSIS — Z982 Presence of cerebrospinal fluid drainage device: Secondary | ICD-10-CM

## 2024-03-14 LAB — COMPREHENSIVE METABOLIC PANEL WITH GFR
ALT: 23 U/L (ref 0–44)
AST: 21 U/L (ref 15–41)
Albumin: 3.6 g/dL (ref 3.5–5.0)
Alkaline Phosphatase: 112 U/L (ref 38–126)
Anion gap: 11 (ref 5–15)
BUN: 8 mg/dL (ref 6–20)
CO2: 26 mmol/L (ref 22–32)
Calcium: 8.7 mg/dL — ABNORMAL LOW (ref 8.9–10.3)
Chloride: 101 mmol/L (ref 98–111)
Creatinine, Ser: 1.22 mg/dL (ref 0.61–1.24)
GFR, Estimated: >60 mL/min (ref >60)
Glucose, Bld: 104 mg/dL — ABNORMAL HIGH (ref 70–99)
Potassium: 3.9 mmol/L (ref 3.5–5.1)
Sodium: 138 mmol/L (ref 135–145)
Total Bilirubin: 1.1 mg/dL (ref 0.0–1.2)
Total Protein: 6.3 g/dL — ABNORMAL LOW (ref 6.5–8.1)

## 2024-03-14 LAB — DIFFERENTIAL
Abs Immature Granulocytes: 0.06 10*3/uL (ref 0.00–0.07)
Basophils Absolute: 0.1 10*3/uL (ref 0.0–0.1)
Basophils Relative: 0 %
Eosinophils Absolute: 0.1 10*3/uL (ref 0.0–0.5)
Eosinophils Relative: 1 %
Immature Granulocytes: 1 %
Lymphocytes Relative: 10 %
Lymphs Abs: 1.2 10*3/uL (ref 0.7–4.0)
Monocytes Absolute: 0.9 10*3/uL (ref 0.1–1.0)
Monocytes Relative: 8 %
Neutro Abs: 9.9 10*3/uL — ABNORMAL HIGH (ref 1.7–7.7)
Neutrophils Relative %: 80 %

## 2024-03-14 LAB — CBC
HCT: 41.8 % (ref 39.0–52.0)
Hemoglobin: 14.1 g/dL (ref 13.0–17.0)
MCH: 31.8 pg (ref 26.0–34.0)
MCHC: 33.7 g/dL (ref 30.0–36.0)
MCV: 94.4 fL (ref 80.0–100.0)
Platelets: 192 10*3/uL (ref 150–400)
RBC: 4.43 MIL/uL (ref 4.22–5.81)
RDW: 12.5 % (ref 11.5–15.5)
WBC: 12.3 10*3/uL — ABNORMAL HIGH (ref 4.0–10.5)
nRBC: 0 % (ref 0.0–0.2)

## 2024-03-14 LAB — I-STAT CHEM 8, ED
BUN: 9 mg/dL (ref 6–20)
Calcium, Ion: 1.01 mmol/L — ABNORMAL LOW (ref 1.15–1.40)
Chloride: 99 mmol/L (ref 98–111)
Creatinine, Ser: 1.1 mg/dL (ref 0.61–1.24)
Glucose, Bld: 102 mg/dL — ABNORMAL HIGH (ref 70–99)
HCT: 42 % (ref 39.0–52.0)
Hemoglobin: 14.3 g/dL (ref 13.0–17.0)
Potassium: 3.8 mmol/L (ref 3.5–5.1)
Sodium: 136 mmol/L (ref 135–145)
TCO2: 26 mmol/L (ref 22–32)

## 2024-03-14 LAB — ETHANOL: Alcohol, Ethyl (B): <15 mg/dL (ref <15)

## 2024-03-14 LAB — TSH: TSH: 1.824 u[IU]/mL (ref 0.350–4.500)

## 2024-03-14 LAB — AMMONIA: Ammonia: 22 umol/L (ref 9–35)

## 2024-03-14 LAB — PROTIME-INR
INR: 1 (ref 0.8–1.2)
Prothrombin Time: 13.8 s (ref 11.4–15.2)

## 2024-03-14 LAB — CBG MONITORING, ED: Glucose-Capillary: 100 mg/dL — ABNORMAL HIGH (ref 70–99)

## 2024-03-14 LAB — VITAMIN B12: Vitamin B-12: 121 pg/mL — ABNORMAL LOW (ref 180–914)

## 2024-03-14 LAB — MAGNESIUM: Magnesium: 2.3 mg/dL (ref 1.7–2.4)

## 2024-03-14 LAB — APTT: aPTT: 28 s (ref 24–36)

## 2024-03-14 MED ORDER — DULOXETINE HCL 30 MG PO CPEP
30.0000 mg | ORAL_CAPSULE | Freq: Every day | ORAL | Status: DC
  Administered 2024-03-15 – 2024-03-16 (×2): 30 mg via ORAL
  Filled 2024-03-14 (×2): qty 1

## 2024-03-14 MED ORDER — GABAPENTIN 300 MG PO CAPS
300.0000 mg | ORAL_CAPSULE | Freq: Three times a day (TID) | ORAL | Status: DC
  Administered 2024-03-14 – 2024-03-16 (×6): 300 mg via ORAL
  Filled 2024-03-14 (×6): qty 1

## 2024-03-14 MED ORDER — CLOPIDOGREL BISULFATE 75 MG PO TABS
75.0000 mg | ORAL_TABLET | Freq: Every day | ORAL | Status: DC
  Administered 2024-03-15 – 2024-03-16 (×2): 75 mg via ORAL
  Filled 2024-03-14 (×2): qty 1

## 2024-03-14 MED ORDER — ONDANSETRON HCL 4 MG/2ML IJ SOLN: 4.0000 mg | Freq: Four times a day (QID) | INTRAMUSCULAR | Status: DC | PRN

## 2024-03-14 MED ORDER — IOHEXOL 350 MG/ML SOLN
100.0000 mL | Freq: Once | INTRAVENOUS | Status: AC | PRN
  Administered 2024-03-14: 100 mL via INTRAVENOUS

## 2024-03-14 MED ORDER — DIPHENHYDRAMINE HCL 50 MG/ML IJ SOLN
50.0000 mg | Freq: Once | INTRAMUSCULAR | Status: AC
  Administered 2024-03-14: 50 mg via INTRAVENOUS
  Filled 2024-03-14: qty 1

## 2024-03-14 MED ORDER — ATORVASTATIN CALCIUM 40 MG PO TABS
40.0000 mg | ORAL_TABLET | Freq: Every day | ORAL | Status: DC
  Administered 2024-03-15 – 2024-03-16 (×2): 40 mg via ORAL
  Filled 2024-03-14 (×2): qty 1

## 2024-03-14 MED ORDER — ENOXAPARIN SODIUM 40 MG/0.4ML IJ SOSY
40.0000 mg | PREFILLED_SYRINGE | INTRAMUSCULAR | Status: DC
  Administered 2024-03-15 – 2024-03-16 (×2): 40 mg via SUBCUTANEOUS
  Filled 2024-03-14 (×2): qty 0.4

## 2024-03-14 MED ORDER — SODIUM CHLORIDE 0.9% FLUSH
3.0000 mL | Freq: Two times a day (BID) | INTRAVENOUS | Status: DC
  Administered 2024-03-14 – 2024-03-15 (×3): 3 mL via INTRAVENOUS

## 2024-03-14 MED ORDER — LORAZEPAM 2 MG/ML IJ SOLN
2.0000 mg | INTRAMUSCULAR | Status: AC
  Administered 2024-03-14: 2 mg via INTRAVENOUS

## 2024-03-14 MED ORDER — POLYETHYLENE GLYCOL 3350 17 G PO PACK: 17.0000 g | PACK | Freq: Every day | ORAL | Status: DC | PRN

## 2024-03-14 MED ORDER — ACETAMINOPHEN 650 MG RE SUPP: 650.0000 mg | Freq: Four times a day (QID) | RECTAL | Status: DC | PRN

## 2024-03-14 MED ORDER — METHYLPREDNISOLONE SODIUM SUCC 40 MG IJ SOLR
40.0000 mg | Freq: Once | INTRAMUSCULAR | Status: AC
  Administered 2024-03-14: 40 mg via INTRAVENOUS
  Filled 2024-03-14: qty 1

## 2024-03-14 MED ORDER — LORAZEPAM 2 MG/ML IJ SOLN: 2.0000 mg | INTRAMUSCULAR | Status: DC

## 2024-03-14 MED ORDER — METOPROLOL SUCCINATE ER 50 MG PO TB24
50.0000 mg | ORAL_TABLET | Freq: Every day | ORAL | Status: DC
  Administered 2024-03-15 – 2024-03-16 (×2): 50 mg via ORAL
  Filled 2024-03-14 (×2): qty 1

## 2024-03-14 MED ORDER — ACETAMINOPHEN 325 MG PO TABS: 650.0000 mg | ORAL_TABLET | Freq: Four times a day (QID) | ORAL | Status: DC | PRN

## 2024-03-14 MED ORDER — SODIUM CHLORIDE 0.9% FLUSH
3.0000 mL | Freq: Once | INTRAVENOUS | Status: AC
  Administered 2024-03-14: 3 mL via INTRAVENOUS

## 2024-03-14 MED ORDER — LORAZEPAM 2 MG/ML IJ SOLN
INTRAMUSCULAR | Status: AC
  Filled 2024-03-14: qty 1

## 2024-03-14 MED ORDER — ALPRAZOLAM 0.25 MG PO TABS
0.5000 mg | ORAL_TABLET | Freq: Two times a day (BID) | ORAL | Status: DC
  Administered 2024-03-14 – 2024-03-16 (×4): 0.5 mg via ORAL
  Filled 2024-03-14 (×4): qty 2

## 2024-03-14 MED ORDER — ONDANSETRON HCL 4 MG PO TABS: 4.0000 mg | ORAL_TABLET | Freq: Four times a day (QID) | ORAL | Status: DC | PRN

## 2024-03-14 MED ORDER — OXCARBAZEPINE 300 MG PO TABS
450.0000 mg | ORAL_TABLET | Freq: Two times a day (BID) | ORAL | Status: DC
  Administered 2024-03-14 – 2024-03-16 (×4): 450 mg via ORAL
  Filled 2024-03-14 (×5): qty 1

## 2024-03-14 NOTE — ED Provider Notes (Signed)
 [place] EMERGENCY DEPARTMENT AT [facility] Provider Note   CSN: [id number] Arrival date & time: 03/14/24  1351     History  Chief Complaint  Patient presents with   Code Stroke    [name] Love is a 53 y.o. male.  Patient is a 53 year old male with past medical history of seizure disorder on gabapentin  and Trileptal , VP shunt in place, cochlear implants, prior CVA presenting to the emergency department with altered mental status.  Per EMS the patient had a fall last night and a second fall this morning.  His caregiver saw him today for the first time in 2 days though he does live at home with his wife who reported that he was noted to have slurred speech and 911 was called.  EMS reports that his speech was slurred and was having difficulty forming sentences on their arrival and he started to become less responsive and route.  Patient was made a prehospital arrival code stroke.  The history is provided by the EMS personnel. The history is limited by the condition of the patient.       Home Medications Prior to Admission medications   Medication Sig Start Date End Date Taking? Authorizing Provider  acetaminophen  (TYLENOL ) 500 MG tablet Take 2 tablets (1,000 mg total) by mouth every 8 (eight) hours. 08/23/23   Jayson Michael, MD  ALPRAZolam  (XANAX ) 0.5 MG tablet Take 1 tablet (0.5 mg total) by mouth 2 (two) times daily. 08/23/23   Jayson Michael, MD  atorvastatin  (LIPITOR) 40 MG tablet Take 1 tablet (40 mg total) by mouth daily. 08/24/23   Jayson Michael, MD  clopidogrel  (PLAVIX ) 75 MG tablet TAKE 1 TABLET(75 MG) BY MOUTH DAILY 11/11/23   Krasowski, Robert J, MD  cyanocobalamin  1000 MCG tablet Take 1 tablet (1,000 mcg total) by mouth daily. Patient not taking: Reported on 12/07/2023 08/24/23   Jayson Michael, MD  DULoxetine  (CYMBALTA ) 30 MG capsule Take 1 capsule (30 mg total) by mouth daily. 08/24/23   Jayson Michael, MD  fluticasone  [facility]) 50 MCG/ACT nasal spray Place 2 sprays into  both nostrils 2 (two) times daily as needed for allergies or rhinitis.    [provider]  gabapentin  (NEURONTIN ) 300 MG capsule Take 1 capsule (300 mg total) by mouth 3 (three) times daily. 08/23/23   Jayson Michael, MD  lidocaine  (LIDODERM ) 5 % Place 2 patches onto the skin daily as needed. Remove & Discard patch within 12 hours or as directed by MD Patient not taking: Reported on 12/07/2023 08/23/23   Jayson Michael, MD  metoprolol  succinate (TOPROL -XL) 50 MG 24 hr tablet Take 50 mg by mouth daily. Take with or immediately following a meal.    [provider]  Oxcarbazepine  (TRILEPTAL ) 300 MG tablet Take 1.5 tablets (450 mg total) by mouth 2 (two) times daily. 12/07/23   Wess Hammed, NP  polyethylene glycol (MIRALAX  / GLYCOLAX ) 17 g packet Take 17 g by mouth daily as needed for moderate constipation. 08/23/23   Jayson Michael, MD      Allergies    Baclofen , Seroquel [quetiapine], Sulfa antibiotics, Coconut flavoring agent (non-screening), and Contrast media [iodinated contrast media]    Review of Systems   Review of Systems  Physical Exam Updated Vital Signs BP 110/81   Pulse 89   Temp 98.9 F (37.2 C) (Axillary)   Resp (!) 21   Ht 5' 8 (1.727 m)   Wt 96 kg   SpO2 92%   BMI 32.18 kg/m  Physical  Exam Vitals and nursing note reviewed.  Constitutional:      General: He is not in acute distress.    Comments: Drowsy, opens eyes and withdrawals to noxious stimuli  HENT:     Head: Normocephalic and atraumatic.     Nose: Nose normal.     Mouth/Throat:     Mouth: Mucous membranes are moist.     Pharynx: Oropharynx is clear.     Comments: No evidence of tongue bite Eyes:     Conjunctiva/sclera: Conjunctivae normal.     Pupils: Pupils are equal, round, and reactive to light.  Cardiovascular:     Rate and Rhythm: Normal rate and regular rhythm.     Heart sounds: Normal heart sounds.  Pulmonary:     Effort: Pulmonary effort is normal.     Breath sounds: Normal breath  sounds.  Abdominal:     General: Abdomen is flat.     Palpations: Abdomen is soft.     Tenderness: There is no abdominal tenderness.  Musculoskeletal:     Cervical back: Normal range of motion.     Right lower leg: No edema.     Left lower leg: No edema.  Skin:    General: Skin is warm and dry.  Neurological:     Comments: Oriented x0, not speaking or answering questions Opens eyes and withdrawals in all 4 extremities with noxious stimuli     ED Results / Procedures / Treatments   Labs (all labs ordered are listed, but only abnormal results are displayed) Labs Reviewed  CBC - Abnormal; Notable for the following components:      Result Value   WBC 12.3 (*)    All other components within normal limits  DIFFERENTIAL - Abnormal; Notable for the following components:   Neutro Abs 9.9 (*)    All other components within normal limits  COMPREHENSIVE METABOLIC PANEL WITH GFR - Abnormal; Notable for the following components:   Glucose, Bld 104 (*)    Calcium  8.7 (*)    Total Protein 6.3 (*)    All other components within normal limits  I-STAT CHEM 8, ED - Abnormal; Notable for the following components:   Glucose, Bld 102 (*)    Calcium , Ion 1.01 (*)    All other components within normal limits  CBG MONITORING, ED - Abnormal; Notable for the following components:   Glucose-Capillary 100 (*)    All other components within normal limits  PROTIME-INR  APTT  ETHANOL  OXCARBAZEPINE  (TRILEPTAL ), SERUM  MAGNESIUM    EKG EKG Interpretation Date/Time:  Wednesday March 14 2024 14:14:47 EDT Ventricular Rate:  91 PR Interval:  166 QRS Duration:  93 QT Interval:  336 QTC Calculation: 414 R Axis:   64  Text Interpretation: Sinus rhythm No significant change since last tracing Confirmed by Celesta Coke ([id number]) on 03/14/2024 2:34:09 PM  Radiology DG Chest Port 1 View Result Date: 03/14/2024 CLINICAL DATA:  Hypoxia. EXAM: PORTABLE CHEST 1 VIEW COMPARISON:  September 29, 2023.  FINDINGS: Stable cardiomediastinal silhouette. Lungs are clear. Right internal jugular catheter is noted with distal tip in expected position cavoatrial junction. Bony thorax is unremarkable. IMPRESSION: No active disease. Electronically Signed   By: Rosalene Colon M.D.   On: 03/14/2024 15:21   CT HEAD CODE STROKE WO CONTRAST` Result Date: 03/14/2024 CLINICAL DATA:  Code stroke. Neuro deficit, concern for stroke, left-sided weakness and facial droop. EXAM: CT HEAD WITHOUT CONTRAST TECHNIQUE: Contiguous axial images were obtained from the base of  the skull through the vertex without intravenous contrast. RADIATION DOSE REDUCTION: This exam was performed according to the departmental dose-optimization program which includes automated exposure control, adjustment of the mA and/or kV according to patient size and/or use of iterative reconstruction technique. COMPARISON:  CT head 12/13/2023. FINDINGS: Brain: Streak artifact from bilateral cochlear implants slightly limits evaluation. Within these limitations there is no acute intracranial hemorrhage. No CT evidence of completed large territory infarct. Similar appearance of dystrophic calcifications within the cerebellum, brainstem, and occipital lobes likely related to prior radiotherapy. Remote lacunar infarct in the right aspect of the pons. Nonspecific hypoattenuation in the periventricular and subcortical white matter favored to reflect chronic microvascular ischemic changes. No significant edema, mass effect, or midline shift. The basilar cisterns are patent. Similar appearance of dural thickening and trace subdural collections bilaterally. Ventricles: Ventricles are stable in size and configuration compared to prior. Right parietal approach ventriculostomy catheter which crosses midline with tip at the left caudal thalamic groove. Vascular: Atherosclerotic calcifications of the carotid siphons. No hyperdense vessel. Skull: Postsurgical changes of suboccipital  craniectomy. No acute or aggressive findings. Orbits: Orbits are symmetric. Sinuses: The visualized paranasal sinuses are clear. Other: Bilateral cochlear implants noted. Small bilateral mastoid effusions. ASPECTS [facility] Stroke Program Early CT Score) - Ganglionic level infarction (caudate, lentiform nuclei, internal capsule, insula, M1-M3 cortex): 7 - Supraganglionic infarction (M4-M6 cortex): 3 Total score (0-10 with 10 being normal): 10 IMPRESSION: 1. No CT evidence of acute intracranial abnormality. 2. Similar dystrophic calcifications and white matter changes likely related to prior radiotherapy. White matter changes also likely related to component of chronic microvascular ischemic changes. 3. Similar right parietal ventriculostomy catheter. Stable size and configuration of the ventricles. 4. Dural thickening and small subdural collections are similar to prior. 5. ASPECTS is 10 These results were communicated to Dr. Lindzen at 2:14 pm on 03/14/2024 by text page via the [facility] messaging system. Electronically Signed   By: Denny Flack M.D.   On: 03/14/2024 14:14    Procedures Procedures    Medications Ordered in ED Medications  sodium chloride  flush (NS) 0.9 % injection 3 mL (3 mLs Intravenous Given 03/14/24 1400)  LORazepam (ATIVAN) injection 2 mg (2 mg Intravenous Given 03/14/24 1404)    ED Course/ Medical Decision Making/ A&P Clinical Course as of 03/14/24 1549  Wed Mar 14, 2024  1427 Patient outside window for TNK, recommended STAT EEG for concern for seizure with prolonged post-ictal state. Neurology reports dispo pending EEG and reassessment of mental status. [VK]  1523 Dr. Lindzen recommends if STAT EEG is unrevealing will need CTA Head/Neck. [VK]  1548 Patient signed out to Dr. Ranelle Buys pending EEG for disposition. [VK]    Clinical Course User Index [VK] Kingsley, Tangy Drozdowski K, DO                                 Medical Decision Making This patient presents to the ED with chief  complaint(s) of AMS with pertinent past medical history of seizures, CVA, VP shunt, cochlear implants which further complicates the presenting complaint. The complaint involves an extensive differential diagnosis and also carries with it a high risk of complications and morbidity.    The differential diagnosis includes CVA, TIA, breakthrough seizure, prolonged postictal period, hypo or hyperglycemia, electrolyte abnormality, infection  Additional history obtained: Additional history obtained from EMS  Records reviewed outpatient neurology records  ED Course and Reassessment: Patient was made a prehospital  arrival code stroke and was met by neurology at the door.  Airway was cleared by Lynnie Saucier, PA.  IV access was obtained and he was transported to CT scanner.  Patient was initially drowsy and found to have eye gaze deviation with fluttering of his eyelids concerning for seizure activity while in CT and was given 2 mg of Ativan for concern for seizure.  Seizure activity did stop and he did have some mild hypoxia post Ativan and was placed on 2 L nasal cannula and was transported back into the room.  Independent labs interpretation:  The following labs were independently interpreted: within normal range  Independent visualization of imaging: - I independently visualized the following imaging with scope of interpretation limited to determining acute life threatening conditions related to emergency care: CTH, which revealed no acute findings  Consultation: - Consulted or discussed management/test interpretation w/ external professional: neurology  Amount and/or Complexity of Data Reviewed Labs: ordered. Radiology: ordered.          Final Clinical Impression(s) / ED Diagnoses Final diagnoses:  None    Rx / DC Orders ED Discharge Orders     None         Kingsley, Johanna Stafford K, DO 03/14/24 1549

## 2024-03-14 NOTE — ED Provider Notes (Signed)
  Physical Exam  BP 118/84   Pulse 84   Temp 98.7 F (37.1 C) (Axillary)   Resp 19   Ht 5' 8 (1.727 m)   Wt 96 kg   SpO2 97%   BMI 32.18 kg/m   Physical Exam Vitals and nursing note reviewed.  HENT:     Head: Normocephalic and atraumatic.  Eyes:     Pupils: Pupils are equal, round, and reactive to light.  Cardiovascular:     Rate and Rhythm: Normal rate and regular rhythm.  Pulmonary:     Effort: Pulmonary effort is normal.     Breath sounds: Normal breath sounds.  Abdominal:     Palpations: Abdomen is soft.     Tenderness: There is no abdominal tenderness.  Skin:    General: Skin is warm and dry.  Neurological:     Mental Status: He is alert.  Psychiatric:        Mood and Affect: Mood normal.     Procedures  Procedures  ED Course / MDM   Clinical Course as of 03/14/24 2040  Wed Mar 14, 2024  1427 Patient outside window for TNK, recommended STAT EEG for concern for seizure with prolonged post-ictal state. Neurology reports dispo pending EEG and reassessment of mental status. [VK]  1523 Dr. Lindzen recommends if STAT EEG is unrevealing will need CTA Head/Neck. [VK]  1548 Patient signed out to Dr. Ranelle Buys pending EEG for disposition. [VK]  2039 EEG was negative.  CTA head and neck showed no acute findings.  Discussed with neurology who recommends admission.  Neurology team will follow during admission.  Discussed with the hospitalist who accept patient for admission [MP]    Clinical Course User Index [MP] Sallyanne Creamer, DO [VK] Kingsley, Victoria K, DO   Medical Decision Making I, Rafael Bun DO, have assumed care of this patient from the previous provider pending EEG, results from neuro, CTA head and neck if EEG negative and reevaluation  Amount and/or Complexity of Data Reviewed Labs: ordered. Radiology: ordered.  Risk Prescription drug management. Decision regarding hospitalization.          Sallyanne Creamer, DO 03/14/24 2040

## 2024-03-14 NOTE — ED Notes (Signed)
 Patient transported to CT with RN

## 2024-03-14 NOTE — Consult Note (Addendum)
 NEUROLOGY CONSULT NOTE   Date of service: March 14, 2024 Patient Name: [name] Love MRN:  [id number] DOB:  1971/01/18 Chief Complaint: Code stroke Requesting Provider: Nolberto Batty, DO  History of Present Illness  [name] Love is a 53 y.o. male with hx of abnormal gait/wheelchair bound, CVA with deficits, Allergic rhinitis, Anxiety, Arthritis , Atypical chest pain , Bilateral arm weakness, Bilateral hearing loss , Brain cancer, Cerebral thrombosis with cerebral infarction, Cochlear implant due to sensorineural hearing loss, VP shunt placement, Depression, Diplopia, Dyspnea, Ependymoma of brain, Generalized anxiety disorder, Headache, Hypothyroidism, Insomnia, Low vitamin B12 level, Memory loss, Myalgia, OSA on CPAP, Recurrent falls, Refusal of blood transfusions as patient is Jehovah's Witness, Seizure, Sleeping difficulty, and Ventricular ectopy.  Last known well was 9am. His family noted that he had slurred speech. He then had a fall. Per EMS he was speaking in individual words and was dysarthric. Additionally he was not oriented on their arrival. He did have a fall this afternoon which was the reason for EMS being called. On arrival to the bridge he is unresponsive to verbal stimuli and initially his gaze is deviated up with beating eye movements. With sternal rub his eyes open fully and he does move purposefully all extremities, right more than left.   He called his cousin [name] Love (his POA) at 2am stating he fell and was able to get up to his chair. Left eye was blurrier than usual. 630am eye was blurry and arms felt heavy. Fell around 1pm and the caregiver couldn't get him up so she called EMS. Gabapentin  lowered but he does occasionally take more. Limited walker use but mainly wheelchair. Speech is slow and understandable but slower and a little softer but easily understandable. Postictal state is slurred speech. Residual deficit from stroke is left lower extremity weakness, but he is able to  use left arm.   He is currently on trileptal  and gabapentin . He follows with [facility]. He had an EEG done in [name] and was most recently seen in the clinic in March, at which time he was having daily seizures and daily falls.   Stroke History 06/2018 admitted for left thalamic infarct and left occipital punctate infarct.  Discharged on DAPT.  30-day cardiac event monitoring no A-fib. 04/2019 admitted for right cerebellar infarcts.  MRA showed right SCA occlusion, likely due to previous radiation.  Carotid Doppler negative.  EF 60 to 65%.  LDL 64, A1c 5.3.  Discharged on aspirin  and Lipitor 80. 08/2023- Right brain stroke vs Todds paralysis. DAPT and then plavix .   LKW: 0900 Modified rankin score: 4-Needs assistance to walk and tend to bodily needs IV Thrombolysis: No, outside the window EVT: No, exam not consistent with LVO and mRS of 4  NIHSS components Score: Comment  1a Level of Conscious 0[]  1[x]  2[]  3[]      1b LOC Questions 0[]  1[]  2[x]       1c LOC Commands 0[]  1[]  2[x]       2 Best Gaze 0[x]  1[]  2[]       3 Visual 0[]  1[]  2[]  3[x]      4 Facial Palsy 0[x]  1[]  2[]  3[]      5a Motor Arm - left 0[]  1[]  2[x]  3[]  4[]  UN[]    5b Motor Arm - Right 0[]  1[]  2[x]  3[]  4[]  UN[]    6a Motor Leg - Left 0[]  1[]  2[]  3[x]  4[]  UN[]    6b Motor Leg - Right 0[]  1[]  2[]  3[x]  4[]  UN[]    7 Limb Ataxia 0[x]  1[]   2[]  UN[]      8 Sensory 0[x]  1[]  2[]  UN[]      9 Best Language 0[]  1[]  2[]  3[x]      10 Dysarthria 0[]  1[]  2[x]  UN[]      11 Extinct. and Inattention 0[x]  1[]  2[]       TOTAL:    23      ROS   Unable to ascertain due to altered mental status   Past History   Past Medical History:  Diagnosis Date   Abnormal gait    Acute arterial ischemic stroke, vertebrobasilar, thalamic (HCC) 06/08/2018   Acute CVA (cerebrovascular accident) (HCC) 06/07/2018   Acute CVA (cerebrovascular accident) (HCC) 06/07/2018   Allergic rhinitis    Anxiety    Arthritis 11/05/2013   Atypical chest  pain 11/30/2017   Bilateral arm weakness    Bilateral hearing loss 07/28/2020   Brain cancer (HCC)    Cerebral thrombosis with cerebral infarction 04/17/2019   Cochlear implant in place with multiple channels 05/15/2020   CVA (cerebrovascular accident) (HCC) 04/18/2019   Depression    Diplopia    Dyspnea on exertion 11/30/2017   Ependymoma of brain (HCC)    Episode of change in speech 04/17/2019   Generalized anxiety disorder 11/28/2018   Headache 11/05/2013   Hearing loss, sensorineural    Hemiparesis and alteration of sensations as late effects of stroke (HCC) 09/04/2018   History of stroke 04/01/2020   Hypothyroidism    Insomnia    Left leg weakness    Low vitamin B12 level    Memory loss 11/05/2013   Myalgia 04/18/2017   OSA on CPAP 11/28/2018   Recurrent falls    Refusal of blood transfusions as patient is Jehovah's Witness 04/01/2020   Seizure (HCC)    Seizures (HCC)    Sleeping difficulty 11/05/2013   Stroke-like symptoms    Ventricular ectopy 06/04/2020   Weakness 04/16/2019   Weakness generalized 04/17/2019    Past Surgical History:  Procedure Laterality Date   BRAIN SURGERY     HERNIA REPAIR     SHUNT REVISION      Family History: Family History  Problem Relation Age of Onset   Brain cancer Mother    High blood pressure Mother     Social History  reports that he has never smoked. He has never used smokeless tobacco. He reports that he does not drink alcohol and does not use drugs.  Allergies  Allergen Reactions   Baclofen      Urinary retension   Seroquel [Quetiapine] Other (See Comments)    Weight gain   Sulfa Antibiotics Other (See Comments)    Reaction ??   Coconut Flavoring Agent (Non-Screening) Rash and Other (See Comments)    ANYTHING COCONUT- allergic to this   Contrast Media [Iodinated Contrast Media] Rash    Medications   Current Facility-Administered Medications:    sodium chloride  flush (NS) 0.9 % injection 3 mL, 3 mL,  Intravenous, Once, Kingsley, Victoria K, DO  Current Outpatient Medications:    acetaminophen  (TYLENOL ) 500 MG tablet, Take 2 tablets (1,000 mg total) by mouth every 8 (eight) hours., Disp: , Rfl:    ALPRAZolam  (XANAX ) 0.5 MG tablet, Take 1 tablet (0.5 mg total) by mouth 2 (two) times daily., Disp: 60 tablet, Rfl: 2   atorvastatin  (LIPITOR) 40 MG tablet, Take 1 tablet (40 mg total) by mouth daily., Disp: , Rfl:    clopidogrel  (PLAVIX ) 75 MG tablet, TAKE 1 TABLET(75 MG) BY MOUTH DAILY, Disp: 90 tablet, Rfl: 2  cyanocobalamin  1000 MCG tablet, Take 1 tablet (1,000 mcg total) by mouth daily. (Patient not taking: Reported on 12/07/2023), Disp: , Rfl:    DULoxetine  (CYMBALTA ) 30 MG capsule, Take 1 capsule (30 mg total) by mouth daily., Disp: , Rfl:    fluticasone  (FLONASE) 50 MCG/ACT nasal spray, Place 2 sprays into both nostrils 2 (two) times daily as needed for allergies or rhinitis., Disp: , Rfl:    gabapentin  (NEURONTIN ) 300 MG capsule, Take 1 capsule (300 mg total) by mouth 3 (three) times daily., Disp: , Rfl:    lidocaine  (LIDODERM ) 5 %, Place 2 patches onto the skin daily as needed. Remove & Discard patch within 12 hours or as directed by MD (Patient not taking: Reported on 12/07/2023), Disp: , Rfl:    metoprolol  succinate (TOPROL -XL) 50 MG 24 hr tablet, Take 50 mg by mouth daily. Take with or immediately following a meal., Disp: , Rfl:    Oxcarbazepine  (TRILEPTAL ) 300 MG tablet, Take 1.5 tablets (450 mg total) by mouth 2 (two) times daily., Disp: 270 tablet, Rfl: 1   polyethylene glycol (MIRALAX  / GLYCOLAX ) 17 g packet, Take 17 g by mouth daily as needed for moderate constipation., Disp: , Rfl:   Vitals   BP 110/81   Pulse 89   Temp 98.9 F (37.2 C) (Axillary)   Resp (!) 21   Ht 5' 8 (1.727 m)   Wt 96 kg   SpO2 92%   BMI 32.18 kg/m    Physical Exam   Physical Exam HEENT- [place]/AT. Receivers for his bilateral cochlear implants are present externally on the right and left.  Neck is  supple.  Lungs- Respirations unlabored Extremities- Warm and well-perfused  Neurological Examination Mental Status: Eyes are open with decreased responsiveness to questions and external stimuli. Slow movements. Drowsy to lethargic. Nonverbal. Follows only about 10% of all commands that also require loud repetition for him to perform, including gripping hands and tongue protrusion. Will localize to sternal rub with his left and right upper extremities and moan in discomfort.  Cranial Nerves: II: PERRL. Made brief eye contact with examiner. Does not blink to threat in either visual field.  III,IV, VI: No ptosis. Eyes are conjugate with roving EOM. On arrival to the ED eyes were deviated upwards with coarse intermittent multidirectional nystagmus that then resolved; this briefly occurred again after CT. Doll's eye reflex is suppressed. He does not track despite frequent repetitions of this command and encouragement. Roving and spontaneous EOM cross the midline to left and right, but he does not gaze fully to the left or right.  V: Closes eyes bilaterally to eyelid stimulation. VII: Grimace is weak but symmetric VIII: Will alert to loud vocalizations with cochlear implant external devices in place.  IX,X: Gag reflex deferred XI: Symmetric XII: Midline tongue extension Motor/Sensory: RUE: Localizes antigravity to sternal rub. Will withdraw to noxious pinch stimuli. Does not follow commands other than grip.  LUE: : Localizes antigravity to sternal rub. Will withdraw to noxious pinch stimuli. Does not follow commands other than grip. RLE: Withdraws with antigravity movement after sustained noxious plantar stimulation for 20 seconds.  LLE: Withdraws with antigravity movement after 5 seconds of noxious plantar stimulation. Deep Tendon Reflexes: Right patellar 3+, right achilles 3+. Left patellar 4+, left achilles 4+. 3 beats clonus to right ankle, sustained clonus to left ankle. 3+ bilateral  brachioradialis and biceps. Positive Hoffman's sign bilaterally, more prominent on the left.  Plantars: Right: downgoingLeft: downgoing Cerebellar: Not following commands for assessment.  Gait: Deferred  Labs/Imaging/Neurodiagnostic studies   CBC: No results for input(s): WBC, NEUTROABS, HGB, HCT, MCV, PLT in the last 168 hours. Basic Metabolic Panel:  Lab Results  Component Value Date   NA 139 12/13/2023   K 3.9 12/13/2023   CO2 30 12/13/2023   GLUCOSE 91 12/13/2023   BUN 14 12/13/2023   CREATININE 1.20 12/13/2023   CALCIUM  8.4 (L) 12/13/2023   GFRNONAA >60 12/13/2023   GFRAA >60 04/17/2019   Lipid Panel:  Lab Results  Component Value Date   LDLCALC 98 08/19/2023   HgbA1c:  Lab Results  Component Value Date   HGBA1C 5.4 08/19/2023   Urine Drug Screen:     Component Value Date/Time   LABOPIA NONE DETECTED 08/18/2023 1247   COCAINSCRNUR NONE DETECTED 08/18/2023 1247   LABBENZ POSITIVE (A) 08/18/2023 1247   AMPHETMU NONE DETECTED 08/18/2023 1247   THCU NONE DETECTED 08/18/2023 1247   LABBARB NONE DETECTED 08/18/2023 1247    Alcohol Level     Component Value Date/Time   ETH <10 12/13/2023 1147   INR  Lab Results  Component Value Date   INR 1.1 12/13/2023   APTT  Lab Results  Component Value Date   APTT 27 12/13/2023   AED levels:  Lab Results  Component Value Date   LAMOTRIGINE  2.8 02/02/2018    CT Head without contrast (Personally reviewed): 1. No CT evidence of acute intracranial abnormality. 2. Similar dystrophic calcifications and white matter changes likely related to prior radiotherapy. white matter changes also likely related to component of chronic microvascular ischemic changes. 3. Similar right parietal ventriculostomy catheter. Stable size and configuration of the ventricles. 4. Dural thickening and small subdural collections are similar to prior. 5. ASPECTS is 10  Neurodiagnostics rEEG:  Pending   ASSESSMENT  Xavien Dauphinais is  a 53 y.o. male with a past medical history of strokes, seizures, Brain cancer, Cerebral thrombosis with cerebral infarction, Cochlear implant due to sensorineural hearing loss, VP shunt placement, Ependymoma of brain, Generalized anxiety disorder, Headache, Hypothyroidism, Insomnia, Low vitamin B12 level, Memory loss, Myalgia, OSA on CPAP, Recurrent falls, Refusal of blood transfusions as patient is Jehovah's Witness, Sleeping difficulty, multifocal cerebral calcifications thought to be secondary to prior radiation therapy and Ventricular ectopy, who presents with AMS. Last known well was 9am. His family noted that he had slurred speech. He then had a fall. Per EMS he was initially speaking in individual words and was dysarthric, but not oriented on their arrival. He did have a fall this afternoon which was the reason for EMS being called. He deteriorated en route. On arrival to the bridge he was unresponsive to verbal stimuli and initially his gaze is deviated up with beating eye movements. With sternal rub his eyes open fully and he does move purposefully all extremities, right more than left. At baseline he appears to have some dysarthria and weakness. He is wheelchair bound appears to be relatively independent on chart review.  - Exam with NIHSS of 23 in the context of his AMS.  - CT head: No CT evidence of acute intracranial abnormality. Similar dystrophic calcifications and white matter changes likely related to prior radiotherapy. White matter changes also likely related to component of chronic microvascular ischemic changes. Similar right parietal ventriculostomy catheter. Stable size and configuration of the ventricles. Dural thickening and small subdural collections are similar to prior. ASPECTS is 10 - Impression: Overall presentation is most consistent with an acute encephalopathy. DDx for underlying etiology includes infectious, toxic,  metabolic and secondary to possible unwitnessed seizure at home.    RECOMMENDATIONS  - Oxcarbazepine  level - Continue oxcarbazepine  at 450 mg BID. Give PO if he passes swallow evaluation; otherwise will need NGT.  - Continue Neurontin  and his home Xanax  - Metabolic/infectious work up for AMS.  - MRI would likely be low-yield due to metal artifact from his cochlear implant.  - Stat EEG (ordered) - Mg level - Seizure precautions - Further recommendations following EEG  Addendum: - EEG: This technically difficult study is within normal limits. The excessive beta activity seen in the background is most likely due to the effect of benzodiazepine and is a benign EEG pattern. No seizures or epileptiform discharges were seen throughout the recording. - Given his ocular motility deficits on exam and decreased level of responsiveness, will obtain a CTA of head and neck to rule out a possible posterior circulation LVO  Addendum: CTA of head and neck:  1. No emergent large vessel occlusion or high-grade stenosis of the intracranial arteries. 2. No hemodynamically significant stenosis of the carotid or vertebral arteries. 3. Areas of ischemia identified by the perfusion software are located within the temporal lobes and cerebellum. I suspect this is mostly artifact related to streak from bilateral cochlear implants. If no contraindication, MRI may be helpful.     ______________________________________________________________________    Arletha Bend, NP [facility]  I have seen and examined the patient. I have formulated the assessment and recommendations. 53 year old male with a prior history of seizures and strokes who presents with AMS. Exam reveals NIHSS 23. Overall presentation is most consistent with an acute encephalopathy. DDx for underlying etiology includes infectious, toxic, metabolic and secondary to possible unwitnessed seizure at home. Recommendations as above.  Electronically signed: Dr. Chandra Asher

## 2024-03-14 NOTE — ED Notes (Signed)
 Floor mats placed on floor. Put pt's left hearing aid in. Pt said right hearing aid is dead and this tech put the right hearing aid in bag on bedside table.

## 2024-03-14 NOTE — ED Notes (Signed)
 Bed was soiled, removed and replaced with clean

## 2024-03-14 NOTE — ED Notes (Signed)
 Pt continuously attempts to get out of bed despite coaching.

## 2024-03-14 NOTE — H&P (Signed)
 History and Physical    Govanni Love UJW:[id number] DOB: 11-12-1970 DOA: 03/14/2024  PCP: Annette Barters, MD   Patient coming from: Home  Chief Complaint: Left facial droop, slurred speech, confusion   HPI: [name] Love is a 53 y.o. male with medical history significant for CVA with residual deficits, depression, anxiety, OSA, brain cancer, hearing loss with cochlear implant, VP shunt in place, seizures, and history of NSVT who presents with left facial droop, dysarthria, disorientation, and falls.  The patient fell yesterday but was felt to be in his usual state at 9 am today. He was then noted by family to have dysarthria, left facial droop, and increased confusion. He fell again today and EMS was called.    He denies dysuria or flank pain, reports non-productive cough but denies fever, chills, or SOB.   ED Course: Upon arrival to the ED, patient is found to be afebrile and saturating low 90s on room air with normal HR and stable BP.  Labs are most notable for creatinine 1.22, WBC 12.3, and normal hemoglobin.  No acute findings are noted on head CT, chest x-ray, or CTA.  EEG was within normal limits.  Neurology evaluated the patient in the emergency department and recommended admission for further workup of acute encephalopathy.  Patient  Review of Systems:  ROS limited by patient's clinical condition.  Past Medical History:  Diagnosis Date   Abnormal gait    Acute arterial ischemic stroke, vertebrobasilar, thalamic (HCC) 06/08/2018   Acute CVA (cerebrovascular accident) (HCC) 06/07/2018   Acute CVA (cerebrovascular accident) (HCC) 06/07/2018   Allergic rhinitis    Anxiety    Arthritis 11/05/2013   Atypical chest pain 11/30/2017   Bilateral arm weakness    Bilateral hearing loss 07/28/2020   Brain cancer (HCC)    Cerebral thrombosis with cerebral infarction 04/17/2019   Cochlear implant in place with multiple channels 05/15/2020   CVA (cerebrovascular accident) (HCC) 04/18/2019    Depression    Diplopia    Dyspnea on exertion 11/30/2017   Ependymoma of brain (HCC)    Episode of change in speech 04/17/2019   Generalized anxiety disorder 11/28/2018   Headache 11/05/2013   Hearing loss, sensorineural    Hemiparesis and alteration of sensations as late effects of stroke (HCC) 09/04/2018   History of stroke 04/01/2020   Hypothyroidism    Insomnia    Left leg weakness    Low vitamin B12 level    Memory loss 11/05/2013   Myalgia 04/18/2017   OSA on CPAP 11/28/2018   Recurrent falls    Refusal of blood transfusions as patient is Jehovah's Witness 04/01/2020   Seizure (HCC)    Seizures (HCC)    Sleeping difficulty 11/05/2013   Stroke-like symptoms    Ventricular ectopy 06/04/2020   Weakness 04/16/2019   Weakness generalized 04/17/2019    Past Surgical History:  Procedure Laterality Date   BRAIN SURGERY     HERNIA REPAIR     SHUNT REVISION      Social History:   reports that he has never smoked. He has never used smokeless tobacco. He reports that he does not drink alcohol and does not use drugs.  Allergies  Allergen Reactions   Baclofen      Urinary retension   Seroquel [Quetiapine] Other (See Comments)    Weight gain   Sulfa Antibiotics Other (See Comments)    Reaction ??   Coconut Flavoring Agent (Non-Screening) Rash and Other (See Comments)    ANYTHING COCONUT- allergic  to this   Contrast Media [Iodinated Contrast Media] Rash    Family History  Problem Relation Age of Onset   Brain cancer Mother    High blood pressure Mother      Prior to Admission medications   Medication Sig Start Date End Date Taking? Authorizing Provider  acetaminophen  (TYLENOL ) 500 MG tablet Take 2 tablets (1,000 mg total) by mouth every 8 (eight) hours. 08/23/23   Jayson Michael, MD  ALPRAZolam  (XANAX ) 0.5 MG tablet Take 1 tablet (0.5 mg total) by mouth 2 (two) times daily. 08/23/23   Jayson Michael, MD  atorvastatin  (LIPITOR) 40 MG tablet Take 1 tablet (40 mg total)  by mouth daily. 08/24/23   Jayson Michael, MD  clopidogrel  (PLAVIX ) 75 MG tablet TAKE 1 TABLET(75 MG) BY MOUTH DAILY 11/11/23   Krasowski, Robert J, MD  cyanocobalamin  1000 MCG tablet Take 1 tablet (1,000 mcg total) by mouth daily. Patient not taking: Reported on 12/07/2023 08/24/23   Jayson Michael, MD  DULoxetine  (CYMBALTA ) 30 MG capsule Take 1 capsule (30 mg total) by mouth daily. 08/24/23   Jayson Michael, MD  fluticasone  [facility]) 50 MCG/ACT nasal spray Place 2 sprays into both nostrils 2 (two) times daily as needed for allergies or rhinitis.    [provider]  gabapentin  (NEURONTIN ) 300 MG capsule Take 1 capsule (300 mg total) by mouth 3 (three) times daily. 08/23/23   Jayson Michael, MD  lidocaine  (LIDODERM ) 5 % Place 2 patches onto the skin daily as needed. Remove & Discard patch within 12 hours or as directed by MD Patient not taking: Reported on 12/07/2023 08/23/23   Jayson Michael, MD  metoprolol  succinate (TOPROL -XL) 50 MG 24 hr tablet Take 50 mg by mouth daily. Take with or immediately following a meal.    [provider]  Oxcarbazepine  (TRILEPTAL ) 300 MG tablet Take 1.5 tablets (450 mg total) by mouth 2 (two) times daily. 12/07/23   Wess Hammed, NP  polyethylene glycol (MIRALAX  / GLYCOLAX ) 17 g packet Take 17 g by mouth daily as needed for moderate constipation. 08/23/23   Jayson Michael, MD    Physical Exam: Vitals:   03/14/24 1545 03/14/24 1820 03/14/24 1821 03/14/24 1822  BP: 121/86 115/79    Pulse:  88    Resp: (!) 21 (!) 21    Temp:   98.2 F (36.8 C) 98.7 F (37.1 C)  TempSrc:   Oral Axillary  SpO2:  100%    Weight:      Height:        Constitutional: NAD, no diaphoresis   Eyes: PERTLA, lids and conjunctivae normal ENMT: Mucous membranes are moist. Posterior pharynx clear of any exudate or lesions.   Neck: supple, no masses  Respiratory: no wheezing, no crackles. No accessory muscle use.  Cardiovascular: S1 & S2 heard, regular rate and rhythm. No extremity  edema.   Abdomen: No tenderness, soft. Bowel sounds active.  Musculoskeletal: no clubbing / cyanosis. No joint deformity upper and lower extremities.   Skin: no significant rashes, lesions, ulcers. Warm, dry, well-perfused. Neurologic: Wakes to voice and is oriented to person and place. Speech is slow with mild dysarthria. He is moving all extremities.   Psychiatric: Calm. Cooperative.    Labs and Imaging on Admission: I have personally reviewed following labs and imaging studies  CBC: Recent Labs  Lab 03/14/24 1354 03/14/24 1356  WBC 12.3*  --   NEUTROABS 9.9*  --   HGB 14.1 14.3  HCT 41.8 42.0  MCV 94.4  --  PLT 192  --    Basic Metabolic Panel: Recent Labs  Lab 03/14/24 1354 03/14/24 1356  NA 138 136  K 3.9 3.8  CL 101 99  CO2 26  --   GLUCOSE 104* 102*  BUN 8 9  CREATININE 1.22 1.10  CALCIUM  8.7*  --   MG 2.3  --    GFR: Estimated Creatinine Clearance: 90.2 mL/min (by C-G formula based on SCr of 1.1 mg/dL). Liver Function Tests: Recent Labs  Lab 03/14/24 1354  AST 21  ALT 23  ALKPHOS 112  BILITOT 1.1  PROT 6.3*  ALBUMIN 3.6   No results for input(s): LIPASE, AMYLASE in the last 168 hours. No results for input(s): AMMONIA in the last 168 hours. Coagulation Profile: Recent Labs  Lab 03/14/24 1354  INR 1.0   Cardiac Enzymes: No results for input(s): CKTOTAL, CKMB, CKMBINDEX, TROPONINI in the last 168 hours. BNP (last 3 results) No results for input(s): PROBNP in the last 8760 hours. HbA1C: No results for input(s): HGBA1C in the last 72 hours. CBG: Recent Labs  Lab 03/14/24 1352  GLUCAP 100*   Lipid Profile: No results for input(s): CHOL, HDL, LDLCALC, TRIG, CHOLHDL, LDLDIRECT in the last 72 hours. Thyroid  Function Tests: No results for input(s): TSH, T4TOTAL, FREET4, T3FREE, THYROIDAB in the last 72 hours. Anemia Panel: No results for input(s): VITAMINB12, FOLATE, FERRITIN, TIBC, IRON,  RETICCTPCT in the last 72 hours. Urine analysis:    Component Value Date/Time   COLORURINE YELLOW 08/18/2023 1247   APPEARANCEUR CLEAR 08/18/2023 1247   APPEARANCEUR Clear 07/28/2020 1321   LABSPEC 1.011 08/18/2023 1247   LABSPEC 1.020 11/13/2012 1300   PHURINE 7.0 08/18/2023 1247   GLUCOSEU NEGATIVE 08/18/2023 1247   GLUCOSEU Negative 11/13/2012 1300   HGBUR NEGATIVE 08/18/2023 1247   BILIRUBINUR NEGATIVE 08/18/2023 1247   BILIRUBINUR Negative 07/28/2020 1321   BILIRUBINUR Negative 11/13/2012 1300   KETONESUR NEGATIVE 08/18/2023 1247   PROTEINUR NEGATIVE 08/18/2023 1247   NITRITE NEGATIVE 08/18/2023 1247   LEUKOCYTESUR NEGATIVE 08/18/2023 1247   LEUKOCYTESUR Negative 11/13/2012 1300   Sepsis Labs: @LABRCNTIP (procalcitonin:4,lacticidven:4) )No results found for this or any previous visit (from the past 240 hours).   Radiological Exams on Admission: CT ANGIO HEAD NECK W WO CM W PERF (CODE STROKE) Result Date: 03/14/2024 CLINICAL DATA:  Acute neurologic deficit. Left-sided weakness and facial droop EXAM: CT ANGIOGRAPHY HEAD AND NECK CT PERFUSION BRAIN TECHNIQUE: Multidetector CT imaging of the head and neck was performed using the standard protocol during bolus administration of intravenous contrast. Multiplanar CT image reconstructions and MIPs were obtained to evaluate the vascular anatomy. Carotid stenosis measurements (when applicable) are obtained utilizing NASCET criteria, using the distal internal carotid diameter as the denominator. Multiphase CT imaging of the brain was performed following IV bolus contrast injection. Subsequent parametric perfusion maps were calculated using RAPID software. RADIATION DOSE REDUCTION: This exam was performed according to the departmental dose-optimization program which includes automated exposure control, adjustment of the mA and/or kV according to patient size and/or use of iterative reconstruction technique. CONTRAST:  OMNIPAQUE  IOHEXOL   350 MG/ML SOLN COMPARISON:  Head CT 03/14/2024 FINDINGS: CTA NECK FINDINGS Skeleton: No acute abnormality or high grade bony spinal canal stenosis. Other neck: Normal pharynx, larynx and major salivary glands. No cervical lymphadenopathy. Unremarkable thyroid  gland. Upper chest: No pneumothorax or pleural effusion. No nodules or masses. Aortic arch: There is no calcific atherosclerosis of the aortic arch. Conventional 3 vessel aortic branching pattern. RIGHT carotid system: Normal without aneurysm,  dissection or stenosis. LEFT carotid system: Normal without aneurysm, dissection or stenosis. Vertebral arteries: Codominant configuration. There is no dissection, occlusion or flow-limiting stenosis to the skull base (V1-V3 segments). CTA HEAD FINDINGS POSTERIOR CIRCULATION: Vertebral arteries are normal. No proximal occlusion of the anterior or inferior cerebellar arteries. Basilar artery is normal. Superior cerebellar arteries are normal. Posterior cerebral arteries are normal. ANTERIOR CIRCULATION: Streak artifact from cochlear implants causes artifactual hypoattenuation of the petrous segments of both internal carotid arteries. There is minimal atherosclerotic calcification of the cavernous segments. Anterior cerebral arteries are normal. Middle cerebral arteries are normal. Venous sinuses: As permitted by contrast timing, patent. Anatomic variants: None Review of the MIP images confirms the above findings. CT Brain Perfusion Findings: ASPECTS: 10 CBF (<30%) Volume: 0mL Perfusion (Tmax>6.0s) volume: 46mL Mismatch Volume: 46mL The areas of ischemia identified by the perfusion software are located within the temporal lobes and cerebellum. I suspect this is mostly artifact related to streak from bilateral cochlear implants. IMPRESSION: 1. No emergent large vessel occlusion or high-grade stenosis of the intracranial arteries. 2. No hemodynamically significant stenosis of the carotid or vertebral arteries. 3. Areas of  ischemia identified by the perfusion software are located within the temporal lobes and cerebellum. I suspect this is mostly artifact related to streak from bilateral cochlear implants. If no contraindication, MRI may be helpful. Electronically Signed   By: Juanetta Nordmann M.D.   On: 03/14/2024 17:58   EEG adult Result Date: 03/14/2024 Arleene Lack, MD     03/14/2024  3:50 PM Patient Name: Ehab Humber MRN: [id number] Epilepsy Attending: Arleene Lack Referring Physician/Provider: Imogene Mana, NP Date: 03/14/2024 Duration: 25.58 mins Patient history:  53 y.o. male who presents with AMS. Last known well was 9am. His family noted that he had slurred speech. On arrival to the bridge he was unresponsive to verbal stimuli and initially his gaze is deviated up with beating eye movements. With sternal rub his eyes open fully and he does move purposefully all extremities, right more than left. EEG to evaluate for seizure Level of alertness: Awake AEDs during EEG study: Ativan Technical aspects: This EEG study was done with scalp electrodes positioned according to the 10-20 International system of electrode placement. Electrical activity was reviewed with band pass filter of 1-70Hz , sensitivity of 7 uV/mm, display speed of 89mm/sec with a 60Hz  notched filter applied as appropriate. EEG data were recorded continuously and digitally stored.  Video monitoring was available and reviewed as appropriate. Description: The posterior dominant rhythm consists of 8 Hz activity of moderate voltage (25-35 uV) seen predominantly in posterior head regions, symmetric and reactive to eye opening and eye closing. There is an excessive amount of 15 to 18 Hz beta activity distributed symmetrically and diffusely.  Hyperventilation and photic stimulation were not performed.   EEG was technically difficult due to significant myogenic artifact. IMPRESSION: This technically difficult study is within normal limits. The excessive beta activity  seen in the background is most likely due to the effect of benzodiazepine and is a benign EEG pattern. No seizures or epileptiform discharges were seen throughout the recording. A normal interictal EEG does not exclude the diagnosis of epilepsy. Arleene Lack   DG Chest Port 1 View Result Date: 03/14/2024 CLINICAL DATA:  Hypoxia. EXAM: PORTABLE CHEST 1 VIEW COMPARISON:  September 29, 2023. FINDINGS: Stable cardiomediastinal silhouette. Lungs are clear. Right internal jugular catheter is noted with distal tip in expected position cavoatrial junction. Bony thorax is unremarkable. IMPRESSION: No active disease.  Electronically Signed   By: Rosalene Colon M.D.   On: 03/14/2024 15:21   CT HEAD CODE STROKE WO CONTRAST` Result Date: 03/14/2024 CLINICAL DATA:  Code stroke. Neuro deficit, concern for stroke, left-sided weakness and facial droop. EXAM: CT HEAD WITHOUT CONTRAST TECHNIQUE: Contiguous axial images were obtained from the base of the skull through the vertex without intravenous contrast. RADIATION DOSE REDUCTION: This exam was performed according to the departmental dose-optimization program which includes automated exposure control, adjustment of the mA and/or kV according to patient size and/or use of iterative reconstruction technique. COMPARISON:  CT head 12/13/2023. FINDINGS: Brain: Streak artifact from bilateral cochlear implants slightly limits evaluation. Within these limitations there is no acute intracranial hemorrhage. No CT evidence of completed large territory infarct. Similar appearance of dystrophic calcifications within the cerebellum, brainstem, and occipital lobes likely related to prior radiotherapy. Remote lacunar infarct in the right aspect of the pons. Nonspecific hypoattenuation in the periventricular and subcortical white matter favored to reflect chronic microvascular ischemic changes. No significant edema, mass effect, or midline shift. The basilar cisterns are patent. Similar  appearance of dural thickening and trace subdural collections bilaterally. Ventricles: Ventricles are stable in size and configuration compared to prior. Right parietal approach ventriculostomy catheter which crosses midline with tip at the left caudal thalamic groove. Vascular: Atherosclerotic calcifications of the carotid siphons. No hyperdense vessel. Skull: Postsurgical changes of suboccipital craniectomy. No acute or aggressive findings. Orbits: Orbits are symmetric. Sinuses: The visualized paranasal sinuses are clear. Other: Bilateral cochlear implants noted. Small bilateral mastoid effusions. ASPECTS [facility] Stroke Program Early CT Score) - Ganglionic level infarction (caudate, lentiform nuclei, internal capsule, insula, M1-M3 cortex): 7 - Supraganglionic infarction (M4-M6 cortex): 3 Total score (0-10 with 10 being normal): 10 IMPRESSION: 1. No CT evidence of acute intracranial abnormality. 2. Similar dystrophic calcifications and white matter changes likely related to prior radiotherapy. White matter changes also likely related to component of chronic microvascular ischemic changes. 3. Similar right parietal ventriculostomy catheter. Stable size and configuration of the ventricles. 4. Dural thickening and small subdural collections are similar to prior. 5. ASPECTS is 10 These results were communicated to Dr. Lindzen at 2:14 pm on 03/14/2024 by text page via the [facility] messaging system. Electronically Signed   By: Denny Flack M.D.   On: 03/14/2024 14:14    EKG: Independently reviewed. Sinus rhythm.   Assessment/Plan   1. Acute encephalopathy  - He is improving in the ED, now speaking in complete sentences and following commands  - Check Trileptal  level, ammonia, TSH, B12, and UA, use delirium precautions   2. Hx of CVA  - Lipitor, Plavix     3. Seizures  - Continue Trileptal , gabapentin     4. Depression, anxiety  - Cymbalta , Xanax     5. Hx of NSVT  - Toprol     6. OSA  - CPAP while  sleeping    DVT prophylaxis: Lovenox   Code Status: Full  Level of Care: Level of care: Telemetry Medical Family Communication: None present  Disposition Plan:  Patient is from: Home  Anticipated d/c is to: TBD Anticipated d/c date is: 6/12 or 03/16/24  Patient currently: Pending further workup of AMS  Consults called: Neurology  Admission status: Observation     Walton Guppy, MD [facility]  03/14/2024, 8:15 PM

## 2024-03-14 NOTE — Progress Notes (Signed)
 EEG complete - results pending

## 2024-03-14 NOTE — ED Notes (Signed)
 EEG at bedside.

## 2024-03-14 NOTE — ED Notes (Signed)
 Lab added on magnesium by Alexandria Ida

## 2024-03-14 NOTE — ED Triage Notes (Signed)
 Patient arrives via [facility] activated as a code stroke prior to arrival. EMS called by caregiver (niece) April for slurred speech and left sided facial droop. Per EMS, patient speaking in only words without full sentences. Patient had one fall last night and was normal per family, patient had another fall today prior to EMS arrival. Per family, patient was normal at 0900 today. Patient has hx of brain tumor and previous stroke with left side deficit.  Upon patient arrival to ED, patient had seizure like activity with fixed gaze.   EMS vitals BP 124/86 HR 88 O2 98 on room air CBG 79  18 RAC

## 2024-03-14 NOTE — ED Notes (Signed)
 Tamia, RN approved for pt transport to unit.

## 2024-03-14 NOTE — Code Documentation (Signed)
 Stroke Response Nurse Documentation Code Documentation  [name] Love is a 53 y.o. male arriving to [facility]  via [facility] on 03/14/2024 with past medical hx of abnormal gait/wheelchair bound, VP shunt placement, diplopia, CVA with deficits, brain cancer, cerebral thrombosis with cerebral infarction. On clopidogrel  75 mg daily. Code stroke was activated by ED.   Patient from home where he was LKW at 0900 and now complaining of slurred speech. Per EMS, his caretaker hadnt seen him in two days but was told by the wife whom he lives with that he fell last night and was normal. He woke up this morning and fell again shortly before EMS was called. On EMS arrival, patient had slurred speech and left facial droop but could interact and converse with EMS. En route to the ED, he became less responsive.  Stroke team at the bedside on patient arrival. Labs drawn and patient cleared for CT by Dr. Nora Beal. Patient to CT with team. NIHSS 26, see documentation for details and code stroke times. Patient with decreased LOC, disoriented, not following commands, bilateral hemianopia, bilateral arm weakness, bilateral leg weakness, Global aphasia , and dysarthria  on exam. The following imaging was completed:  CT Head. Patient is a candidate for IV Thrombolytic due to out of window per MD. Patient is not a candidate for IR due to no LVO noted on imaging per MD.   Care Plan: VS/NIHSS q2hr x12hours, then q4hr; BP Goal <220/120.   Process Delays Noted: seizing in CT, ativan given.  Bedside handoff with ED RN Alisa App.    [name]  Stroke Response RN

## 2024-03-14 NOTE — Procedures (Signed)
 Patient Name: [name] Love  MRN: [id number]  Epilepsy Attending: Arleene Lack  Referring Physician/Provider: Imogene Mana, NP  Date: 03/14/2024 Duration: 25.58 mins  Patient history:  53 y.o. male who presents with AMS. Last known well was 9am. His family noted that he had slurred speech. On arrival to the bridge he was unresponsive to verbal stimuli and initially his gaze is deviated up with beating eye movements. With sternal rub his eyes open fully and he does move purposefully all extremities, right more than left. EEG to evaluate for seizure  Level of alertness: Awake  AEDs during EEG study: Ativan  Technical aspects: This EEG study was done with scalp electrodes positioned according to the 10-20 International system of electrode placement. Electrical activity was reviewed with band pass filter of 1-70Hz , sensitivity of 7 uV/mm, display speed of 9mm/sec with a 60Hz  notched filter applied as appropriate. EEG data were recorded continuously and digitally stored.  Video monitoring was available and reviewed as appropriate.  Description: The posterior dominant rhythm consists of 8 Hz activity of moderate voltage (25-35 uV) seen predominantly in posterior head regions, symmetric and reactive to eye opening and eye closing. There is an excessive amount of 15 to 18 Hz beta activity distributed symmetrically and diffusely.  Hyperventilation and photic stimulation were not performed.     EEG was technically difficult due to significant myogenic artifact.  IMPRESSION: This technically difficult study is within normal limits. The excessive beta activity seen in the background is most likely due to the effect of benzodiazepine and is a benign EEG pattern. No seizures or epileptiform discharges were seen throughout the recording.  A normal interictal EEG does not exclude the diagnosis of epilepsy.   Tomiko Schoon O Leili Eskenazi

## 2024-03-14 NOTE — ED Notes (Signed)
 April- niece at bedside

## 2024-03-15 ENCOUNTER — Other Ambulatory Visit: Payer: Self-pay

## 2024-03-15 ENCOUNTER — Telehealth: Payer: Self-pay | Admitting: Neurology

## 2024-03-15 DIAGNOSIS — G9340 Encephalopathy, unspecified: Secondary | ICD-10-CM | POA: Diagnosis not present

## 2024-03-15 DIAGNOSIS — Z8673 Personal history of transient ischemic attack (TIA), and cerebral infarction without residual deficits: Secondary | ICD-10-CM | POA: Diagnosis not present

## 2024-03-15 LAB — BASIC METABOLIC PANEL WITH GFR
Anion gap: 9 (ref 5–15)
BUN: 9 mg/dL (ref 6–20)
CO2: 24 mmol/L (ref 22–32)
Calcium: 8.8 mg/dL — ABNORMAL LOW (ref 8.9–10.3)
Chloride: 104 mmol/L (ref 98–111)
Creatinine, Ser: 1.05 mg/dL (ref 0.61–1.24)
GFR, Estimated: >60 mL/min (ref >60)
Glucose, Bld: 121 mg/dL — ABNORMAL HIGH (ref 70–99)
Potassium: 4.2 mmol/L (ref 3.5–5.1)
Sodium: 137 mmol/L (ref 135–145)

## 2024-03-15 LAB — URINALYSIS, ROUTINE W REFLEX MICROSCOPIC
Bilirubin Urine: NEGATIVE
Glucose, UA: NEGATIVE mg/dL
Ketones, ur: NEGATIVE mg/dL
Nitrite: POSITIVE — AB
Protein, ur: NEGATIVE mg/dL
Specific Gravity, Urine: 1.028 (ref 1.005–1.030)
WBC, UA: >50 WBC/hpf (ref 0–5)
pH: 5 (ref 5.0–8.0)

## 2024-03-15 LAB — CBC
HCT: 42.5 % (ref 39.0–52.0)
Hemoglobin: 14.2 g/dL (ref 13.0–17.0)
MCH: 31.6 pg (ref 26.0–34.0)
MCHC: 33.4 g/dL (ref 30.0–36.0)
MCV: 94.4 fL (ref 80.0–100.0)
Platelets: 222 10*3/uL (ref 150–400)
RBC: 4.5 MIL/uL (ref 4.22–5.81)
RDW: 12.3 % (ref 11.5–15.5)
WBC: 11.8 10*3/uL — ABNORMAL HIGH (ref 4.0–10.5)
nRBC: 0 % (ref 0.0–0.2)

## 2024-03-15 MED ORDER — SODIUM CHLORIDE 0.9 % IV SOLN
2.0000 g | INTRAVENOUS | Status: DC
  Administered 2024-03-16: 2 g via INTRAVENOUS
  Filled 2024-03-15: qty 20

## 2024-03-15 MED ORDER — CYANOCOBALAMIN 1000 MCG/ML IJ SOLN
1000.0000 ug | Freq: Every day | INTRAMUSCULAR | Status: DC
  Administered 2024-03-15 – 2024-03-16 (×2): 1000 ug via INTRAMUSCULAR
  Filled 2024-03-15 (×2): qty 1

## 2024-03-15 NOTE — TOC Initial Note (Signed)
 Transition of Care [facility]) - Initial/Assessment Note    Patient Details  Name: [name] Love MRN: [id number] Date of Birth: 1971/05/30  Transition of Care [facility] CM/SW Contact:    Jonathan Neighbor, RN Phone Number: 03/15/2024, 4:08 PM  Clinical Narrative:                  Pt has been staying at his grandmother's home where there are caregivers from 9am-8:30 pm. The caregivers are able to keep an eye on Joshua and assist if needed.  Pts cousin, April has been working to get him into an ALF. He makes too much money to qualify for Medicaid and not enough to private pay. She has been working with SW through [employer] and now APS through [employer]. CM has left voicemail for [facility] APS to see who his assigned case worker is.   Pt has a Health Care agent but pts cousin was not aware of this person. April thought she was the [facility] and didn't know of new paperwork from 08/2023. CM has updated Reggie the [facility] of admission and he was in agreement with April knowing information and participating in discharge plans but states decisions need to go through him.   [facility] is active with patient for HHPT.   TOC following.  Expected Discharge Plan:  (TBD) Barriers to Discharge: Continued Medical Work up   Patient Goals and CMS Choice   CMS Medicare.gov Compare Post Acute Care list provided to:: Legal Guardian Choice offered to / list presented to : [facility] POA / Guardian      Expected Discharge Plan and Services   Discharge Planning Services: CM Consult   Living arrangements for the past 2 months: Single Family Home                                      Prior Living Arrangements/Services Living arrangements for the past 2 months: Single Family Home Lives with:: Other (Comment) Patient language and need for interpreter reviewed:: Yes Do you feel safe going back to the place where you live?: Yes            Criminal Activity/Legal Involvement Pertinent to Current  Situation/Hospitalization: No - Comment as needed  Activities of Daily Living   ADL Screening (condition at time of admission) Independently performs ADLs?: No Does the patient have a NEW difficulty with bathing/dressing/toileting/self-feeding that is expected to last >3 days?: No Does the patient have a NEW difficulty with getting in/out of bed, walking, or climbing stairs that is expected to last >3 days?: No Does the patient have a NEW difficulty with communication that is expected to last >3 days?: No Is the patient deaf or have difficulty hearing?: Yes Does the patient have difficulty seeing, even when wearing glasses/contacts?: No Does the patient have difficulty concentrating, remembering, or making decisions?: No  Permission Sought/Granted                  Emotional Assessment Appearance:: Appears stated age Attitude/Demeanor/Rapport: Engaged   Orientation: : Oriented to Self, Oriented to Place      Admission diagnosis:  Slurred speech [R47.81] History of stroke [Z86.73] Acute encephalopathy [G93.40] Ventriculopleural shunt status [Z98.2] Patient Active Problem List   Diagnosis Date Noted   Acute encephalopathy 03/14/2024   History of CVA (cerebrovascular accident) 03/14/2024   B12 deficiency 08/21/2023   Cerebral infarction (HCC) 08/18/2023   Disorder of left  external ear 11/26/2021   S/P VP shunt 11/17/2021   Hx of non-sustained ventricular tachycardia (HCC) 03/23/2021   Bilateral hearing loss 07/28/2020   Insomnia    Hearing loss, sensorineural    Ependymoma of brain (HCC)    Depression    Brain cancer (HCC)    Bilateral arm weakness    Abnormal gait    Cochlear implant in place with multiple channels 05/15/2020   History of stroke 04/01/2020   Refusal of blood transfusions as patient is Jehovah's Witness 04/01/2020   CVA (cerebrovascular accident) (HCC) 04/18/2019   Cerebral thrombosis with cerebral infarction 04/17/2019   Generalized anxiety  disorder 11/28/2018   OSA on CPAP 11/28/2018   Hemiparesis and alteration of sensations as late effects of stroke (HCC) 09/04/2018   Acute arterial ischemic stroke, vertebrobasilar, thalamic (HCC) 06/08/2018   Seizure (HCC)    Recurrent falls    Diplopia    Allergic rhinitis 04/03/2018   Seizures (HCC) 01/20/2017   Arthritis 11/05/2013   Memory loss 11/05/2013   Sleeping difficulty 11/05/2013   PCP:  Annette Barters, MD Pharmacy:   [facility] [phone] - [place], [place] - [address] AT [facility] OF [address] [place] [address] Phone: [phone] Fax: [phone]     Social Drivers of Health (SDOH) Social History: SDOH Screenings   Food Insecurity: No Food Insecurity (03/14/2024)  Housing: Unknown (03/14/2024)  Transportation Needs: No Transportation Needs (03/14/2024)  Utilities: Not At Risk (03/14/2024)  Financial Resource Strain: Low Risk  (06/07/2018)  Physical Activity: Unknown (06/07/2018)  Social Connections: Unknown (03/14/2024)  Stress: No Stress Concern Present (06/07/2018)  Tobacco Use: Low Risk  (03/14/2024)   SDOH Interventions:     Readmission Risk Interventions     No data to display

## 2024-03-15 NOTE — Progress Notes (Signed)
 NEUROLOGY CONSULT FOLLOW UP NOTE   Date of service: March 15, 2024 Patient Name: [name] Love MRN:  [id number] DOB:  1971/09/26  Interval Hx/subjective   Awake in room. States that he fell out of bed at home because his bed is so high up. He called his cousin April and she told him to call EMS but he declined. He took an extra gabapentin  due to his pain. Right now he states he is pretty close to baseline. He is not able to have an MRI due to his cochlear implants. He follows with Dr. Michale Age with [facility] and Jeanmarie Millet at [facility]   Vitals   Vitals:   03/14/24 2136 03/15/24 0001 03/15/24 0331 03/15/24 0749  BP: 131/71 110/72 107/75 107/74  Pulse: 85 83 73 72  Resp:  20 20 18   Temp: 98.3 F (36.8 C) 97.7 F (36.5 C) (!) 97.5 F (36.4 C) 97.6 F (36.4 C)  TempSrc: Oral Oral Oral Oral  SpO2: 97% 96% 99% 94%  Weight:      Height:         Body mass index is 32.18 kg/m.  Physical Exam   Constitutional: Appears well-developed and well-nourished.  Psych: Affect appropriate to situation.  Eyes: No scleral injection.  HENT: No OP obstrucion.  Head: Normocephalic.  Cardiovascular: Normal rate and regular rhythm.  Respiratory: Effort normal, non-labored breathing.  GI: Soft.  No distension. There is no tenderness.  Skin: WDI.   Neurologic Examination   Neuro: Mental Status: Patient is awake, alert, oriented to person, place, month, year, and situation. Patient is able to give a clear and coherent history. Speech is mildly dysarthric but no aphasia  Cranial Nerves: II: Visual Fields are full. Pupils are equal, round, and reactive to light.  III,IV, VI: EOMI without ptosis or diplopia.  V: Facial sensation is symmetric to temperature VII: Left facial asymmetry  VIII: Hearing is intact to voice X: Palate elevates symmetrically XI: Shoulder shrug is symmetric. XII: Tongue protrudes midline without atrophy or fasciculations.  Motor: Tone is normal. Bulk is normal.  RUE 5/5 LUE  3/5 RLE 4/5 LLE 3/5 Sensory: Sensation is symmetric to light touch and temperature in the arms and legs.  Cerebellar: R FNF intact, L FNF with discoordination    Medications  Current Facility-Administered Medications:    acetaminophen  (TYLENOL ) tablet 650 mg, 650 mg, Oral, Q6H PRN **OR** acetaminophen  (TYLENOL ) suppository 650 mg, 650 mg, Rectal, Q6H PRN, Opyd, Timothy S, MD   ALPRAZolam  (XANAX ) tablet 0.5 mg, 0.5 mg, Oral, BID, Opyd, Timothy S, MD, 0.5 mg at 03/14/24 2314   atorvastatin  (LIPITOR) tablet 40 mg, 40 mg, Oral, Daily, Opyd, Timothy S, MD   clopidogrel  (PLAVIX ) tablet 75 mg, 75 mg, Oral, Daily, Opyd, Timothy S, MD   cyanocobalamin  (VITAMIN B12) injection 1,000 mcg, 1,000 mcg, Intramuscular, Daily, Regalado, Belkys A, MD   DULoxetine  (CYMBALTA ) DR capsule 30 mg, 30 mg, Oral, Daily, Opyd, Timothy S, MD   enoxaparin  (LOVENOX ) injection 40 mg, 40 mg, Subcutaneous, Q24H, Opyd, Timothy S, MD   gabapentin  (NEURONTIN ) capsule 300 mg, 300 mg, Oral, TID, Opyd, Timothy S, MD, 300 mg at 03/14/24 2314   metoprolol  succinate (TOPROL -XL) 24 hr tablet 50 mg, 50 mg, Oral, Daily, Opyd, Timothy S, MD   ondansetron  (ZOFRAN ) tablet 4 mg, 4 mg, Oral, Q6H PRN **OR** ondansetron  (ZOFRAN ) injection 4 mg, 4 mg, Intravenous, Q6H PRN, Opyd, Timothy S, MD   OXcarbazepine  (TRILEPTAL ) tablet 450 mg, 450 mg, Oral, BID, Opyd, Santana Cue, MD,  450 mg at 03/14/24 2314   polyethylene glycol (MIRALAX  / GLYCOLAX ) packet 17 g, 17 g, Oral, Daily PRN, Opyd, Santana Cue, MD   sodium chloride  flush (NS) 0.9 % injection 3 mL, 3 mL, Intravenous, Q12H, Opyd, Santana Cue, MD, 3 mL at 03/14/24 2314  Labs and Diagnostic Imaging   CBC:  Recent Labs  Lab 03/14/24 1354 03/14/24 1356 03/15/24 0516  WBC 12.3*  --  11.8*  NEUTROABS 9.9*  --   --   HGB 14.1 14.3 14.2  HCT 41.8 42.0 42.5  MCV 94.4  --  94.4  PLT 192  --  222    Basic Metabolic Panel:  Lab Results  Component Value Date   NA 137 03/15/2024   K 4.2  03/15/2024   CO2 24 03/15/2024   GLUCOSE 121 (H) 03/15/2024   BUN 9 03/15/2024   CREATININE 1.05 03/15/2024   CALCIUM  8.8 (L) 03/15/2024   GFRNONAA >60 03/15/2024   GFRAA >60 04/17/2019   Lipid Panel:  Lab Results  Component Value Date   LDLCALC 98 08/19/2023   HgbA1c:  Lab Results  Component Value Date   HGBA1C 5.4 08/19/2023   Urine Drug Screen:     Component Value Date/Time   LABOPIA NONE DETECTED 08/18/2023 1247   COCAINSCRNUR NONE DETECTED 08/18/2023 1247   LABBENZ POSITIVE (A) 08/18/2023 1247   AMPHETMU NONE DETECTED 08/18/2023 1247   THCU NONE DETECTED 08/18/2023 1247   LABBARB NONE DETECTED 08/18/2023 1247    Alcohol Level     Component Value Date/Time   ETH <15 03/14/2024 1354   INR  Lab Results  Component Value Date   INR 1.0 03/14/2024   APTT  Lab Results  Component Value Date   APTT 28 03/14/2024   AED levels:  Lab Results  Component Value Date   LAMOTRIGINE  2.8 02/02/2018    CT Head without contrast(Personally reviewed): 1. No CT evidence of acute intracranial abnormality. 2. Similar dystrophic calcifications and white matter changes likely related to prior radiotherapy. white matter changes also likely related to component of chronic microvascular ischemic changes. 3. Similar right parietal ventriculostomy catheter. Stable size and configuration of the ventricles. 4. Dural thickening and small subdural collections are similar to prior. 5. ASPECTS is 10  CT angio Head and Neck with contrast(Personally reviewed): 1. No emergent large vessel occlusion or high-grade stenosis of the intracranial arteries. 2. No hemodynamically significant stenosis of the carotid or vertebral arteries. 3. Areas of ischemia identified by the perfusion software are located within the temporal lobes and cerebellum. I suspect this is mostly artifact related to streak from bilateral cochlear implants. If no contraindication, MRI may be helpful.  rEEG:  This  technically difficult study is within normal limits. The excessive beta activity seen in the background is most likely due to the effect of benzodiazepine and is a benign EEG pattern. No seizures or epileptiform discharges were seen throughout the recording.   Assessment  Zeyad Delaguila is a 53 y.o. male with a past medical history of strokes, seizures, Brain cancer, Cerebral thrombosis with cerebral infarction, Cochlear implant due to sensorineural hearing loss, VP shunt placement, Ependymoma of brain, Generalized anxiety disorder, Headache, Hypothyroidism, Insomnia, Low vitamin B12 level, Memory loss, Myalgia, OSA on CPAP, Recurrent falls, Refusal of blood transfusions as patient is Jehovah's Witness, Ventricular ectopy, Sleeping difficulty, multifocal cerebral calcifications thought to be secondary to prior radiation therapy, who presents with AMS.  At baseline he appears to have some dysarthria and weakness. He is  wheelchair bound appears to be relatively independent on chart review.  - On exam today, he is back to baseline - Imaging studies as above  - Impression: Overall presentation is most consistent with an acute encephalopathy, most likely secondary to the extra Neurontin  he took at home.    Recommendations  - Oxcarbazepine  level pending  - Continue oxcarbazepine  at 450 mg BID - Continue Neurontin  and his home Xanax  - Seizure precautions - Outpatient follow up with neurologist  - Neurohospitalist service will sign off. Please call if there are additional questions.    ______________________________________________________________________   Arletha Bend, NP [facility]  Electronically signed: Dr. Jhoanna Heyde

## 2024-03-15 NOTE — Plan of Care (Signed)
  Problem: Education: Goal: Knowledge of General Education information will improve Description: Including pain rating scale, medication(s)/side effects and non-pharmacologic comfort measures 03/15/2024 0418 by Adriana Hopping, RN Outcome: Progressing 03/15/2024 0418 by Adriana Hopping, RN Outcome: Progressing   Problem: Health Behavior/Discharge Planning: Goal: Ability to manage health-related needs will improve 03/15/2024 0418 by Adriana Hopping, RN Outcome: Progressing 03/15/2024 0418 by Adriana Hopping, RN Outcome: Progressing   Problem: Clinical Measurements: Goal: Ability to maintain clinical measurements within normal limits will improve Outcome: Progressing Goal: Will remain free from infection Outcome: Progressing Goal: Diagnostic test results will improve Outcome: Progressing Goal: Respiratory complications will improve Outcome: Progressing Goal: Cardiovascular complication will be avoided Outcome: Progressing   Problem: Activity: Goal: Risk for activity intolerance will decrease Outcome: Progressing   Problem: Nutrition: Goal: Adequate nutrition will be maintained Outcome: Progressing   Problem: Coping: Goal: Level of anxiety will decrease Outcome: Progressing   Problem: Elimination: Goal: Will not experience complications related to bowel motility Outcome: Progressing Goal: Will not experience complications related to urinary retention Outcome: Progressing   Problem: Pain Managment: Goal: General experience of comfort will improve and/or be controlled Outcome: Progressing   Problem: Safety: Goal: Ability to remain free from injury will improve Outcome: Progressing   Problem: Skin Integrity: Goal: Risk for impaired skin integrity will decrease Outcome: Progressing

## 2024-03-15 NOTE — Progress Notes (Signed)
 PROGRESS NOTE    [name] Love  XBJ:[id number] DOB: [birth year] DOA: 03/14/2024 PCP: Annette Barters, MD   Brief Narrative: 53 year old past medical history significant for CVA with residual deficits, depression, anxiety, OSA, hearing loss with cochlear implant, VP shunt in place, seizure, NSVT who presents with  left facial droop, dysarthria, disorientation and fall.  Evaluation in the ED CT head no acute finding EEG within normal limits.  Neurology recommended admission for further workup of acute encephalopathy.   Assessment & Plan:   Principal Problem:   Acute encephalopathy Active Problems:   Seizures (HCC)   Generalized anxiety disorder   OSA on CPAP   Depression   Hx of non-sustained ventricular tachycardia (HCC)   History of CVA (cerebrovascular accident)  1-Acute encephalopathy - Patient presented with new onset disorientation, left facial droop, dysarthria - CT head: No evidence of kidney changes.  Similar right parietal ventriculostomy catheter.  Stable size and configuration of the ventricles. - CT angio head and neck: No emergent large vessel occlusion or high-grade stenosis of the intracranial arteries.  Areas of ischemia identified by perfusion software located in the temporal lobe and cerebellum could be artifact related to bilateral cochlear implants. - B 12  at 121. Started  - TSH 1.8, ammonia 22, EtOH less than 15   History of CVA -Continue with Plavix  Lipitor.   History of seizure disorder -Continue Trileptal  and gabapentin .   Depression, anxiety -Continue Cymbalta   History of NSVT -Continue with Metoprolol .   OSA CPAP  Estimated body mass index is 32.18 kg/m as calculated from the following:   Height as of this encounter: 5' 8 (1.727 m).   Weight as of this encounter: 96 kg.   DVT prophylaxis: Lovenox  Code Status: Full code Family Communication: care Discussed with April Disposition Plan:  Status is: Observation The patient remains OBS  appropriate and will d/c before 2 midnights.    Consultants:  Neurology   Procedures:    Antimicrobials:    Subjective: He is alert, report he saw Dr [facility] for blurry vision, double vision. He feels back to baseline.   Objective: Vitals:   03/14/24 2015 03/14/24 2136 03/15/24 0001 03/15/24 0331  BP: 118/84 131/71 110/72 107/75  Pulse: 84 85 83 73  Resp: 19  20 20   Temp:  98.3 F (36.8 C) 97.7 F (36.5 C) (!) 97.5 F (36.4 C)  TempSrc:  Oral Oral Oral  SpO2: 97% 97% 96% 99%  Weight:      Height:        Intake/Output Summary (Last 24 hours) at 03/15/2024 0709 Last data filed at 03/15/2024 0508 Gross per 24 hour  Intake --  Output 400 ml  Net -400 ml   Filed Weights   03/14/24 1300 03/14/24 1433  Weight: 96 kg 96 kg    Examination:  General exam: Appears calm and comfortable  Respiratory system: Clear to auscultation. Respiratory effort normal. Cardiovascular system: S1 & S2 heard, RRR. No JVD, murmurs, rubs, gallops or clicks. No pedal edema. Gastrointestinal system: Abdomen is nondistended, soft and nontender. No organomegaly or masses felt. Normal bowel sounds heard. Central nervous system: Alert and oriented.  Extremities: no edema   Data Reviewed: I have personally reviewed following labs and imaging studies  CBC: Recent Labs  Lab 03/14/24 1354 03/14/24 1356 03/15/24 0516  WBC 12.3*  --  11.8*  NEUTROABS 9.9*  --   --   HGB 14.1 14.3 14.2  HCT 41.8 42.0 42.5  MCV 94.4  --  94.4  PLT 192  --  222   Basic Metabolic Panel: Recent Labs  Lab 03/14/24 1354 03/14/24 1356 03/15/24 0516  NA 138 136 137  K 3.9 3.8 4.2  CL 101 99 104  CO2 26  --  24  GLUCOSE 104* 102* 121*  BUN 8 9 9   CREATININE 1.22 1.10 1.05  CALCIUM  8.7*  --  8.8*  MG 2.3  --   --    GFR: Estimated Creatinine Clearance: 94.5 mL/min (by C-G formula based on SCr of 1.05 mg/dL). Liver Function Tests: Recent Labs  Lab 03/14/24 1354  AST 21  ALT 23  ALKPHOS 112   BILITOT 1.1  PROT 6.3*  ALBUMIN 3.6   No results for input(s): LIPASE, AMYLASE in the last 168 hours. Recent Labs  Lab 03/14/24 2230  AMMONIA 22   Coagulation Profile: Recent Labs  Lab 03/14/24 1354  INR 1.0   Cardiac Enzymes: No results for input(s): CKTOTAL, CKMB, CKMBINDEX, TROPONINI in the last 168 hours. BNP (last 3 results) No results for input(s): PROBNP in the last 8760 hours. HbA1C: No results for input(s): HGBA1C in the last 72 hours. CBG: Recent Labs  Lab 03/14/24 1352  GLUCAP 100*   Lipid Profile: No results for input(s): CHOL, HDL, LDLCALC, TRIG, CHOLHDL, LDLDIRECT in the last 72 hours. Thyroid  Function Tests: Recent Labs    03/14/24 2230  TSH 1.824   Anemia Panel: Recent Labs    03/14/24 2230  VITAMINB12 121*   Sepsis Labs: No results for input(s): PROCALCITON, LATICACIDVEN in the last 168 hours.  No results found for this or any previous visit (from the past 240 hours).       Radiology Studies: CT ANGIO HEAD NECK W WO CM W PERF (CODE STROKE) Result Date: 03/14/2024 CLINICAL DATA:  Acute neurologic deficit. Left-sided weakness and facial droop EXAM: CT ANGIOGRAPHY HEAD AND NECK CT PERFUSION BRAIN TECHNIQUE: Multidetector CT imaging of the head and neck was performed using the standard protocol during bolus administration of intravenous contrast. Multiplanar CT image reconstructions and MIPs were obtained to evaluate the vascular anatomy. Carotid stenosis measurements (when applicable) are obtained utilizing NASCET criteria, using the distal internal carotid diameter as the denominator. Multiphase CT imaging of the brain was performed following IV bolus contrast injection. Subsequent parametric perfusion maps were calculated using RAPID software. RADIATION DOSE REDUCTION: This exam was performed according to the departmental dose-optimization program which includes automated exposure control, adjustment of the mA  and/or kV according to patient size and/or use of iterative reconstruction technique. CONTRAST:  OMNIPAQUE  IOHEXOL  350 MG/ML SOLN COMPARISON:  Head CT 03/14/2024 FINDINGS: CTA NECK FINDINGS Skeleton: No acute abnormality or high grade bony spinal canal stenosis. Other neck: Normal pharynx, larynx and major salivary glands. No cervical lymphadenopathy. Unremarkable thyroid  gland. Upper chest: No pneumothorax or pleural effusion. No nodules or masses. Aortic arch: There is no calcific atherosclerosis of the aortic arch. Conventional 3 vessel aortic branching pattern. RIGHT carotid system: Normal without aneurysm, dissection or stenosis. LEFT carotid system: Normal without aneurysm, dissection or stenosis. Vertebral arteries: Codominant configuration. There is no dissection, occlusion or flow-limiting stenosis to the skull base (V1-V3 segments). CTA HEAD FINDINGS POSTERIOR CIRCULATION: Vertebral arteries are normal. No proximal occlusion of the anterior or inferior cerebellar arteries. Basilar artery is normal. Superior cerebellar arteries are normal. Posterior cerebral arteries are normal. ANTERIOR CIRCULATION: Streak artifact from cochlear implants causes artifactual hypoattenuation of the petrous segments of both internal carotid arteries. There is minimal atherosclerotic calcification  of the cavernous segments. Anterior cerebral arteries are normal. Middle cerebral arteries are normal. Venous sinuses: As permitted by contrast timing, patent. Anatomic variants: None Review of the MIP images confirms the above findings. CT Brain Perfusion Findings: ASPECTS: 10 CBF (<30%) Volume: 0mL Perfusion (Tmax>6.0s) volume: 46mL Mismatch Volume: 46mL The areas of ischemia identified by the perfusion software are located within the temporal lobes and cerebellum. I suspect this is mostly artifact related to streak from bilateral cochlear implants. IMPRESSION: 1. No emergent large vessel occlusion or high-grade stenosis of  the intracranial arteries. 2. No hemodynamically significant stenosis of the carotid or vertebral arteries. 3. Areas of ischemia identified by the perfusion software are located within the temporal lobes and cerebellum. I suspect this is mostly artifact related to streak from bilateral cochlear implants. If no contraindication, MRI may be helpful. Electronically Signed   By: Juanetta Nordmann M.D.   On: 03/14/2024 17:58   EEG adult Result Date: 03/14/2024 Arleene Lack, MD     03/14/2024  3:50 PM Patient Name: Mearle Drew MRN: [id number] Epilepsy Attending: Arleene Lack Referring Physician/Provider: Imogene Mana, NP Date: 03/14/2024 Duration: 25.58 mins Patient history:  53 y.o. male who presents with AMS. Last known well was 9am. His family noted that he had slurred speech. [place] he was unresponsive to verbal stimuli and initially his gaze is deviated up with beating eye movements. With sternal rub his eyes open fully and he does move purposefully all extremities, right more than left. EEG to evaluate for seizure Level of alertness: Awake AEDs during EEG study: Ativan Technical aspects: This EEG study was done with scalp electrodes positioned according to the 10-20 International system of electrode placement. Electrical activity was reviewed with band pass filter of 1-70Hz , sensitivity of 7 uV/mm, display speed of 81mm/sec with a 60Hz  notched filter applied as appropriate. EEG data were recorded continuously and digitally stored.  Video monitoring was available and reviewed as appropriate. Description: The posterior dominant rhythm consists of 8 Hz activity of moderate voltage (25-35 uV) seen predominantly in posterior head regions, symmetric and reactive to eye opening and eye closing. There is an excessive amount of 15 to 18 Hz beta activity distributed symmetrically and diffusely.  Hyperventilation and photic stimulation were not performed.   EEG was technically difficult due to  significant myogenic artifact. IMPRESSION: This technically difficult study is within normal limits. The excessive beta activity seen in the background is most likely due to the effect of benzodiazepine and is a benign EEG pattern. No seizures or epileptiform discharges were seen throughout the recording. A normal interictal EEG does not exclude the diagnosis of epilepsy. Arleene Lack   DG Chest Port 1 View Result Date: 03/14/2024 CLINICAL DATA:  Hypoxia. EXAM: PORTABLE CHEST 1 VIEW COMPARISON:  September 29, 2023. FINDINGS: Stable cardiomediastinal silhouette. Lungs are clear. Right internal jugular catheter is noted with distal tip in expected position cavoatrial junction. Bony thorax is unremarkable. IMPRESSION: No active disease. Electronically Signed   By: Rosalene Colon M.D.   On: 03/14/2024 15:21   CT HEAD CODE STROKE WO CONTRAST` Result Date: 03/14/2024 CLINICAL DATA:  Code stroke. Neuro deficit, concern for stroke, left-sided weakness and facial droop. EXAM: CT HEAD WITHOUT CONTRAST TECHNIQUE: Contiguous axial images were obtained from the base of the skull through the vertex without intravenous contrast. RADIATION DOSE REDUCTION: This exam was performed according to the departmental dose-optimization program which includes automated exposure control, adjustment of the mA and/or  kV according to patient size and/or use of iterative reconstruction technique. COMPARISON:  CT head 12/13/2023. FINDINGS: Brain: Streak artifact from bilateral cochlear implants slightly limits evaluation. Within these limitations there is no acute intracranial hemorrhage. No CT evidence of completed large territory infarct. Similar appearance of dystrophic calcifications within the cerebellum, brainstem, and occipital lobes likely related to prior radiotherapy. Remote lacunar infarct in the right aspect of the pons. Nonspecific hypoattenuation in the periventricular and subcortical white matter favored to reflect chronic  microvascular ischemic changes. No significant edema, mass effect, or midline shift. The basilar cisterns are patent. Similar appearance of dural thickening and trace subdural collections bilaterally. Ventricles: Ventricles are stable in size and configuration compared to prior. Right parietal approach ventriculostomy catheter which crosses midline with tip at the left caudal thalamic groove. Vascular: Atherosclerotic calcifications of the carotid siphons. No hyperdense vessel. Skull: Postsurgical changes of suboccipital craniectomy. No acute or aggressive findings. Orbits: Orbits are symmetric. Sinuses: The visualized paranasal sinuses are clear. Other: Bilateral cochlear implants noted. Small bilateral mastoid effusions. ASPECTS [facility] Stroke Program Early CT Score) - Ganglionic level infarction (caudate, lentiform nuclei, internal capsule, insula, M1-M3 cortex): 7 - Supraganglionic infarction (M4-M6 cortex): 3 Total score (0-10 with 10 being normal): 10 IMPRESSION: 1. No CT evidence of acute intracranial abnormality. 2. Similar dystrophic calcifications and white matter changes likely related to prior radiotherapy. White matter changes also likely related to component of chronic microvascular ischemic changes. 3. Similar right parietal ventriculostomy catheter. Stable size and configuration of the ventricles. 4. Dural thickening and small subdural collections are similar to prior. 5. ASPECTS is 10 These results were communicated to Dr. Lindzen at 2:14 pm on 03/14/2024 by text page via the [facility] messaging system. Electronically Signed   By: Denny Flack M.D.   On: 03/14/2024 14:14        Scheduled Meds:  ALPRAZolam   0.5 mg Oral BID   atorvastatin   40 mg Oral Daily   clopidogrel   75 mg Oral Daily   DULoxetine   30 mg Oral Daily   enoxaparin  (LOVENOX ) injection  40 mg Subcutaneous Q24H   gabapentin   300 mg Oral TID   metoprolol  succinate  50 mg Oral Daily   Oxcarbazepine   450 mg Oral BID   sodium  chloride flush  3 mL Intravenous Q12H   Continuous Infusions:   LOS: 0 days    Time spent: 35 minutes    Mariadelosang Wynns A Dianne Whelchel, MD [facility]   If 7PM-7AM, please contact night-coverage [email or web address]  03/15/2024, 7:09 AM

## 2024-03-15 NOTE — Care Management Obs Status (Signed)
 MEDICARE OBSERVATION STATUS NOTIFICATION   Patient Details  Name: Breyon Sigg MRN: [id number] Date of Birth: [birth year]   Medicare Observation Status Notification Given:  Yes    Jonathan Neighbor, RN 03/15/2024, 3:36 PM

## 2024-03-15 NOTE — Evaluation (Signed)
 Physical Therapy Evaluation Patient Details Name: [name] Love MRN: [id number] DOB: 09/05/1971 Today's Date: 03/15/2024  History of Present Illness  Patient is a 53 y/o male admitted 03/14/24 with slurred speech, confusion and L facial droop.  CTH negative for acute findings and EEG WNL. CT angio with some concern for areas of ischemia though possible for artifact from bilateral cochlear implants.  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  Clinical Impression  Patient presents with decreased mobility due to decreased balance and safety with multiple falls at home.  Currently he needs CGA to S for mobility up to recliner at the bedside.  REports feels safer doing it here since the bed is lower than his at home.  Feel he can benefit from skilled PT in the acute setting and from resuming HHPT at d/c.     If plan is discharge home, recommend the following: A little help with walking and/or transfers;Supervision due to cognitive status;Assistance with cooking/housework;A little help with bathing/dressing/bathroom   Can travel by private vehicle        Equipment Recommendations None recommended by PT  Recommendations for Other Services       Functional Status Assessment Patient has had a recent decline in their functional status and demonstrates the ability to make significant improvements in function in a reasonable and predictable amount of time.     Precautions / Restrictions Precautions Precautions: Fall Precaution/Restrictions Comments: reports multiple falls at home      Mobility  Bed Mobility Overal bed mobility: Needs Assistance Bed Mobility: Supine to Sit     Supine to sit: Contact guard, HOB elevated, Used rails     General bed mobility comments: increased time, assist for balance    Transfers Overall transfer level: Needs assistance    Transfers: Bed to chair/wheelchair/BSC     Step pivot transfers: Contact guard assist       General transfer comment: some posterior bias with CGA for balance pt reaching with both hands to arms on recliner and stepping to chairl though plop to sit, advised safer to sit controlled due to risk of chair moving    Ambulation/Gait                  Stairs            Wheelchair Mobility     Tilt Bed    Modified Rankin (Stroke Patients Only)       Balance Overall balance assessment: Needs assistance Sitting-balance support: Feet supported Sitting balance-Leahy Scale: Fair Sitting balance - Comments: unsupported on EOB; slides on sandals while on EOB                                     Pertinent Vitals/Pain Pain Assessment Pain Assessment: Faces Faces Pain Scale: Hurts little more Pain Location: both legs chronic Pain Descriptors / Indicators: Cramping, Discomfort Pain Intervention(s): Monitored during session, Repositioned    Home Living Family/patient expects to be discharged to:: Private residence Living Arrangements: Other relatives (grandmother with dementia) Available Help at Discharge: Family;Available PRN/intermittently Type of Home: House Home Access: Level entry       Home Layout: One level Home Equipment: Educational psychologist (2 wheels);Wheelchair - manual;Shower seat;Grab bars - tub/shower;BSC/3in1;Hand held shower head;Grab bars - toilet Additional Comments: Grandmother has caregivers that come and help her and make  her meals.  Cousin or friend takes [name] Love to the store and he sometimes fixes his own meals. goes out to get mail with wheelchair and has fallen outside, also from tall bed at home, scheduled to get adjustable bed on June27 he feels will help him not fall as much; was getting HHPT via [facility] prior to admission    Prior Function Prior Level of Function : Needs assist             Mobility Comments:  usually maneuvers around in wheelchair at home ADLs Comments: Does some standing and reports can stand for IADL's at times in kitchen preparing a meal     Extremity/Trunk Assessment   Upper Extremity Assessment Upper Extremity Assessment: Right hand dominant    Lower Extremity Assessment Lower Extremity Assessment: LLE deficits/detail LLE Deficits / Details: lifts antigravity and can flex knee, though cannot hold to resistance LLE Sensation: decreased light touch;decreased proprioception LLE Coordination: decreased gross motor;decreased fine motor       Communication   Communication Communication: Impaired Factors Affecting Communication: Hearing impaired (cochlear implants)    Cognition Arousal: Alert Behavior During Therapy: WFL for tasks assessed/performed   PT - Cognitive impairments: Problem solving, Safety/Judgement, No family/caregiver present to determine baseline                         Following commands: Impaired Following commands impaired: Only follows one step commands consistently     Cueing       General Comments General comments (skin integrity, edema, etc.): on RA VSS, friend in the room and supportive.    Exercises     Assessment/Plan    PT Assessment Patient needs continued PT services  PT Problem List Decreased balance;Decreased mobility;Decreased safety awareness       PT Treatment Interventions DME instruction;Functional mobility training;Therapeutic activities;Therapeutic exercise;Wheelchair mobility training;Patient/family education;Gait training    PT Goals (Current goals can be found in the Care Plan section)  Acute Rehab PT Goals Patient Stated Goal: return home PT Goal Formulation: With patient Time For Goal Achievement: 03/29/24 Potential to Achieve Goals: Good    Frequency Min 2X/week     Co-evaluation               AM-PAC PT 6 Clicks Mobility  Outcome Measure Help needed turning from your back to your  side while in a flat bed without using bedrails?: None Help needed moving from lying on your back to sitting on the side of a flat bed without using bedrails?: A Little Help needed moving to and from a bed to a chair (including a wheelchair)?: A Little Help needed standing up from a chair using your arms (e.g., wheelchair or bedside chair)?: A Little Help needed to walk in hospital room?: Total Help needed climbing 3-5 steps with a railing? : Total 6 Click Score: 15    End of Session Equipment Utilized During Treatment: Gait belt Activity Tolerance: Patient tolerated treatment well Patient left: in chair;with call bell/phone within reach;with chair alarm set;with family/visitor present   PT Visit Diagnosis: Other abnormalities of gait and mobility (R26.89);History of falling (Z91.81);Other symptoms and signs involving the nervous system (R29.898)    Time: 4098-1191 PT Time Calculation (min) (ACUTE ONLY): 30 min   Charges:   PT Evaluation $PT Eval Moderate Complexity: 1 Mod PT Treatments $Therapeutic Activity: 8-22 mins PT General Charges $$ ACUTE PT VISIT: 1 Visit         Abigail Hoff, PT  Acute Rehabilitation Services Office:[phone] 03/15/2024   Marley Simmers 03/15/2024, 4:11 PM

## 2024-03-15 NOTE — Telephone Encounter (Signed)
 April called  inregards to mask left on part of brain . Airl and pt would like  a call to  clarify is thoses results or Julious Ohms is confused      [phone]

## 2024-03-16 DIAGNOSIS — G9340 Encephalopathy, unspecified: Secondary | ICD-10-CM | POA: Diagnosis not present

## 2024-03-16 LAB — CBC
HCT: 41.2 % (ref 39.0–52.0)
Hemoglobin: 13.5 g/dL (ref 13.0–17.0)
MCH: 31.5 pg (ref 26.0–34.0)
MCHC: 32.8 g/dL (ref 30.0–36.0)
MCV: 96 fL (ref 80.0–100.0)
Platelets: 190 10*3/uL (ref 150–400)
RBC: 4.29 MIL/uL (ref 4.22–5.81)
RDW: 12.5 % (ref 11.5–15.5)
WBC: 6.5 10*3/uL (ref 4.0–10.5)
nRBC: 0 % (ref 0.0–0.2)

## 2024-03-16 LAB — BASIC METABOLIC PANEL WITH GFR
Anion gap: 11 (ref 5–15)
BUN: 13 mg/dL (ref 6–20)
CO2: 26 mmol/L (ref 22–32)
Calcium: 8.2 mg/dL — ABNORMAL LOW (ref 8.9–10.3)
Chloride: 101 mmol/L (ref 98–111)
Creatinine, Ser: 1.21 mg/dL (ref 0.61–1.24)
GFR, Estimated: >60 mL/min (ref >60)
Glucose, Bld: 102 mg/dL — ABNORMAL HIGH (ref 70–99)
Potassium: 4 mmol/L (ref 3.5–5.1)
Sodium: 138 mmol/L (ref 135–145)

## 2024-03-16 MED ORDER — CEFPODOXIME PROXETIL 200 MG PO TABS: 200.0000 mg | ORAL_TABLET | Freq: Two times a day (BID) | ORAL | 0 refills | Status: AC

## 2024-03-16 MED ORDER — CYANOCOBALAMIN 500 MCG PO TABS
1000.0000 ug | ORAL_TABLET | Freq: Every day | ORAL | 0 refills | Status: AC
Start: 2024-03-16 — End: 2024-04-15

## 2024-03-16 MED ORDER — SODIUM CHLORIDE 0.9 % IV SOLN
2.0000 g | INTRAVENOUS | Status: DC
  Administered 2024-03-16: 2 g via INTRAVENOUS
  Filled 2024-03-16: qty 20

## 2024-03-16 NOTE — Discharge Summary (Signed)
 Physician Discharge Summary   Patient: [name] Love MRN: [id number] DOB: Sep 25, 1971  Admit date:     03/14/2024  Discharge date: 03/16/24  Discharge Physician: Danette Duos   PCP: Annette Barters, MD   Recommendations at discharge:   Follow Urine culture results.  Needs B 12 level in 4 weeks.  Follow up with Neurology   Discharge Diagnoses: Principal Problem:   Acute encephalopathy Active Problems:   Seizures (HCC)   Generalized anxiety disorder   OSA on CPAP   Depression   Hx of non-sustained ventricular tachycardia (HCC)   History of CVA (cerebrovascular accident)  Resolved Problems:   * No resolved hospital problems. *  Hospital Course: 53 year old past medical history significant for CVA with residual deficits, depression, anxiety, OSA, hearing loss with cochlear implant, VP shunt in place, seizure, NSVT who presents with  left facial droop, dysarthria, disorientation and fall.   Evaluation in the ED CT head no acute finding EEG within normal limits.  Neurology recommended admission for further workup of acute encephalopathy.    Assessment and Plan: 1-Acute encephalopathy - Patient presented with new onset disorientation, left facial droop, dysarthria - CT head: No evidence of kidney changes.  Similar right parietal ventriculostomy catheter.  Stable size and configuration of the ventricles. - CT angio head and neck: No emergent large vessel occlusion or high-grade stenosis of the intracranial arteries.  Areas of ischemia identified by perfusion software located in the temporal lobe and cerebellum could be artifact related to bilateral cochlear implants. - B 12  at 121. Started  - TSH 1.8, ammonia 22, EtOH less than 15  Suspect related to extra dose of gabapentin  and or UTI Back to baseline  UTI;  UA with more that 50 WBC. Positive nitrates.  Started on ceftriaxone .  Discharge on Cefpodoxime for 5 days.  Please follow urine culture.    B-12 Deficiency:  B 12  ;120 Started on B 12 supplement.  Discharge on oral B-12   History of CVA -Continue with Plavix  Lipitor.    History of seizure disorder -Continue Trileptal  and gabapentin .    Depression, anxiety -Continue Cymbalta    History of NSVT -Continue with Metoprolol .    OSA CPAP   I updated POA on 6/12.  Estimated body mass index is 32.18 kg/m as calculated from the following:   Height as of this encounter: 5' 8 (1.727 m).   Weight as of this encounter: 96 kg.          Consultants: Neurology  Procedures performed: none Disposition: Home Diet recommendation:  Discharge Diet Orders (From admission, onward)     Start     Ordered   03/16/24 0000  Diet - low sodium heart healthy        03/16/24 0911           Regular diet DISCHARGE MEDICATION: Allergies as of 03/16/2024       Reactions   Baclofen     Urinary retension   Seroquel [quetiapine] Other (See Comments)   Weight gain   Sulfa Antibiotics Other (See Comments)   Reaction ??   Coconut Flavoring Agent (non-screening) Rash, Other (See Comments)   ANYTHING COCONUT- allergic to this   Contrast Media [iodinated Contrast Media] Rash        Medication List     TAKE these medications    acetaminophen  500 MG tablet Commonly known as: TYLENOL  Take 2 tablets (1,000 mg total) by mouth every 8 (eight) hours.   ALPRAZolam  0.5 MG tablet  Commonly known as: XANAX  Take 1 tablet (0.5 mg total) by mouth 2 (two) times daily.   atorvastatin  40 MG tablet Commonly known as: LIPITOR Take 1 tablet (40 mg total) by mouth daily.   cefpodoxime 200 MG tablet Commonly known as: VANTIN Take 1 tablet (200 mg total) by mouth 2 (two) times daily for 5 days.   clopidogrel  75 MG tablet Commonly known as: PLAVIX  TAKE 1 TABLET(75 MG) BY MOUTH DAILY   cyanocobalamin  500 MCG tablet Commonly known as: VITAMIN B12 Take 2 tablets (1,000 mcg total) by mouth daily.   DULoxetine  30 MG capsule Commonly known as: CYMBALTA  Take 1  capsule (30 mg total) by mouth daily.   fluticasone  50 MCG/ACT nasal spray Commonly known as: FLONASE Place 2 sprays into both nostrils 2 (two) times daily as needed for allergies or rhinitis.   gabapentin  300 MG capsule Commonly known as: NEURONTIN  Take 1 capsule (300 mg total) by mouth 3 (three) times daily.   metoprolol  succinate 50 MG 24 hr tablet Commonly known as: TOPROL -XL Take 50 mg by mouth daily. Take with or immediately following a meal.   Oxcarbazepine  300 MG tablet Commonly known as: TRILEPTAL  Take 1.5 tablets (450 mg total) by mouth 2 (two) times daily.   polyethylene glycol 17 g packet Commonly known as: MIRALAX  / GLYCOLAX  Take 17 g by mouth daily as needed for moderate constipation.        Follow-up Information     Wess Hammed, NP Follow up.   Specialty: Neurology Contact information: [address] [place] [address] [phone]         [facility] Follow up.   Specialty: Home Health Services Why: [facility] will contact you for the first home visit Contact information: [address] [place] [address] [phone]                Discharge Exam: Cleavon Curls Weights   03/14/24 1300 03/14/24 1433  Weight: 96 kg 96 kg   General; NAD  Condition at discharge: stable  The results of significant diagnostics from this hospitalization (including imaging, microbiology, ancillary and laboratory) are listed below for reference.   Imaging Studies: CT ANGIO HEAD NECK W WO CM W PERF (CODE STROKE) Result Date: 03/14/2024 CLINICAL DATA:  Acute neurologic deficit. Left-sided weakness and facial droop EXAM: CT ANGIOGRAPHY HEAD AND NECK CT PERFUSION BRAIN TECHNIQUE: Multidetector CT imaging of the head and neck was performed using the standard protocol during bolus administration of intravenous contrast. Multiplanar CT image reconstructions and MIPs were obtained to evaluate the vascular anatomy. Carotid stenosis measurements  (when applicable) are obtained utilizing NASCET criteria, using the distal internal carotid diameter as the denominator. Multiphase CT imaging of the brain was performed following IV bolus contrast injection. Subsequent parametric perfusion maps were calculated using RAPID software. RADIATION DOSE REDUCTION: This exam was performed according to the departmental dose-optimization program which includes automated exposure control, adjustment of the mA and/or kV according to patient size and/or use of iterative reconstruction technique. CONTRAST:  OMNIPAQUE  IOHEXOL  350 MG/ML SOLN COMPARISON:  Head CT 03/14/2024 FINDINGS: CTA NECK FINDINGS Skeleton: No acute abnormality or high grade bony spinal canal stenosis. Other neck: Normal pharynx, larynx and major salivary glands. No cervical lymphadenopathy. Unremarkable thyroid  gland. Upper chest: No pneumothorax or pleural effusion. No nodules or masses. Aortic arch: There is no calcific atherosclerosis of the aortic arch. Conventional 3 vessel aortic branching pattern. RIGHT carotid system: Normal without aneurysm, dissection or  stenosis. LEFT carotid system: Normal without aneurysm, dissection or stenosis. Vertebral arteries: Codominant configuration. There is no dissection, occlusion or flow-limiting stenosis to the skull base (V1-V3 segments). CTA HEAD FINDINGS POSTERIOR CIRCULATION: Vertebral arteries are normal. No proximal occlusion of the anterior or inferior cerebellar arteries. Basilar artery is normal. Superior cerebellar arteries are normal. Posterior cerebral arteries are normal. ANTERIOR CIRCULATION: Streak artifact from cochlear implants causes artifactual hypoattenuation of the petrous segments of both internal carotid arteries. There is minimal atherosclerotic calcification of the cavernous segments. Anterior cerebral arteries are normal. Middle cerebral arteries are normal. Venous sinuses: As permitted by contrast timing, patent. Anatomic variants:  None Review of the MIP images confirms the above findings. CT Brain Perfusion Findings: ASPECTS: 10 CBF (<30%) Volume: 0mL Perfusion (Tmax>6.0s) volume: 46mL Mismatch Volume: 46mL The areas of ischemia identified by the perfusion software are located within the temporal lobes and cerebellum. I suspect this is mostly artifact related to streak from bilateral cochlear implants. IMPRESSION: 1. No emergent large vessel occlusion or high-grade stenosis of the intracranial arteries. 2. No hemodynamically significant stenosis of the carotid or vertebral arteries. 3. Areas of ischemia identified by the perfusion software are located within the temporal lobes and cerebellum. I suspect this is mostly artifact related to streak from bilateral cochlear implants. If no contraindication, MRI may be helpful. Electronically Signed   By: Juanetta Nordmann M.D.   On: 03/14/2024 17:58   EEG adult Result Date: 03/14/2024 Arleene Lack, MD     03/14/2024  3:50 PM Patient Name: Westen Dinino MRN: [id number] Epilepsy Attending: Arleene Lack Referring Physician/Provider: Imogene Mana, NP Date: 03/14/2024 Duration: 25.58 mins Patient history:  53 y.o. male who presents with AMS. Last known well was 9am. His family noted that he had slurred speech. [place] he was unresponsive to verbal stimuli and initially his gaze is deviated up with beating eye movements. With sternal rub his eyes open fully and he does move purposefully all extremities, right more than left. EEG to evaluate for seizure Level of alertness: Awake AEDs during EEG study: Ativan  Technical aspects: This EEG study was done with scalp electrodes positioned according to the 10-20 International system of electrode placement. Electrical activity was reviewed with band pass filter of 1-70Hz , sensitivity of 7 uV/mm, display speed of 35mm/sec with a 60Hz  notched filter applied as appropriate. EEG data were recorded continuously and digitally stored.  Video  monitoring was available and reviewed as appropriate. Description: The posterior dominant rhythm consists of 8 Hz activity of moderate voltage (25-35 uV) seen predominantly in posterior head regions, symmetric and reactive to eye opening and eye closing. There is an excessive amount of 15 to 18 Hz beta activity distributed symmetrically and diffusely.  Hyperventilation and photic stimulation were not performed.   EEG was technically difficult due to significant myogenic artifact. IMPRESSION: This technically difficult study is within normal limits. The excessive beta activity seen in the background is most likely due to the effect of benzodiazepine and is a benign EEG pattern. No seizures or epileptiform discharges were seen throughout the recording. A normal interictal EEG does not exclude the diagnosis of epilepsy. Arleene Lack   DG Chest Port 1 View Result Date: 03/14/2024 CLINICAL DATA:  Hypoxia. EXAM: PORTABLE CHEST 1 VIEW COMPARISON:  September 29, 2023. FINDINGS: Stable cardiomediastinal silhouette. Lungs are clear. Right internal jugular catheter is noted with distal tip in expected position cavoatrial junction. Bony thorax is unremarkable. IMPRESSION: No active disease. Electronically Signed  By: Rosalene Colon M.D.   On: 03/14/2024 15:21   CT HEAD CODE STROKE WO CONTRAST` Result Date: 03/14/2024 CLINICAL DATA:  Code stroke. Neuro deficit, concern for stroke, left-sided weakness and facial droop. EXAM: CT HEAD WITHOUT CONTRAST TECHNIQUE: Contiguous axial images were obtained from the base of the skull through the vertex without intravenous contrast. RADIATION DOSE REDUCTION: This exam was performed according to the departmental dose-optimization program which includes automated exposure control, adjustment of the mA and/or kV according to patient size and/or use of iterative reconstruction technique. COMPARISON:  CT head 12/13/2023. FINDINGS: Brain: Streak artifact from bilateral cochlear  implants slightly limits evaluation. Within these limitations there is no acute intracranial hemorrhage. No CT evidence of completed large territory infarct. Similar appearance of dystrophic calcifications within the cerebellum, brainstem, and occipital lobes likely related to prior radiotherapy. Remote lacunar infarct in the right aspect of the pons. Nonspecific hypoattenuation in the periventricular and subcortical white matter favored to reflect chronic microvascular ischemic changes. No significant edema, mass effect, or midline shift. The basilar cisterns are patent. Similar appearance of dural thickening and trace subdural collections bilaterally. Ventricles: Ventricles are stable in size and configuration compared to prior. Right parietal approach ventriculostomy catheter which crosses midline with tip at the left caudal thalamic groove. Vascular: Atherosclerotic calcifications of the carotid siphons. No hyperdense vessel. Skull: Postsurgical changes of suboccipital craniectomy. No acute or aggressive findings. Orbits: Orbits are symmetric. Sinuses: The visualized paranasal sinuses are clear. Other: Bilateral cochlear implants noted. Small bilateral mastoid effusions. ASPECTS [facility] Stroke Program Early CT Score) - Ganglionic level infarction (caudate, lentiform nuclei, internal capsule, insula, M1-M3 cortex): 7 - Supraganglionic infarction (M4-M6 cortex): 3 Total score (0-10 with 10 being normal): 10 IMPRESSION: 1. No CT evidence of acute intracranial abnormality. 2. Similar dystrophic calcifications and white matter changes likely related to prior radiotherapy. White matter changes also likely related to component of chronic microvascular ischemic changes. 3. Similar right parietal ventriculostomy catheter. Stable size and configuration of the ventricles. 4. Dural thickening and small subdural collections are similar to prior. 5. ASPECTS is 10 These results were communicated to Dr. Lindzen at 2:14 pm on  03/14/2024 by text page via the [facility] messaging system. Electronically Signed   By: Denny Flack M.D.   On: 03/14/2024 14:14    Microbiology: Results for orders placed or performed in visit on 07/28/20  Microscopic Examination     Status: None   Collection Time: 07/28/20  1:21 PM   Urine  Result Value Ref Range Status   WBC, UA 0-5 0 - 5 /hpf Final   RBC, Urine None seen 0 - 2 /hpf Final   Epithelial Cells (non renal) 0-10 0 - 10 /hpf Final   Bacteria, UA None seen None seen/Few Final    Labs: CBC: Recent Labs  Lab 03/14/24 1354 03/14/24 1356 03/15/24 0516 03/16/24 0800  WBC 12.3*  --  11.8* 6.5  NEUTROABS 9.9*  --   --   --   HGB 14.1 14.3 14.2 13.5  HCT 41.8 42.0 42.5 41.2  MCV 94.4  --  94.4 96.0  PLT 192  --  222 190   Basic Metabolic Panel: Recent Labs  Lab 03/14/24 1354 03/14/24 1356 03/15/24 0516 03/16/24 0800  NA 138 136 137 138  K 3.9 3.8 4.2 4.0  CL 101 99 104 101  CO2 26  --  24 26  GLUCOSE 104* 102* 121* 102*  BUN 8 9 9 13   CREATININE 1.22 1.10  1.05 1.21  CALCIUM  8.7*  --  8.8* 8.2*  MG 2.3  --   --   --    Liver Function Tests: Recent Labs  Lab 03/14/24 1354  AST 21  ALT 23  ALKPHOS 112  BILITOT 1.1  PROT 6.3*  ALBUMIN 3.6   CBG: Recent Labs  Lab 03/14/24 1352  GLUCAP 100*    Discharge time spent: greater than 30 minutes.  Signed: Danette Duos, MD [facility] 03/16/2024

## 2024-03-16 NOTE — TOC Transition Note (Signed)
 Transition of Care [facility]) - Discharge Note   Patient Details  Name: [name] Love MRN: [id number] Date of Birth: [birth year]  [facility] CM/SW Contact:  Jonathan Neighbor, RN Phone Number: 03/16/2024, 10:02 AM   Clinical Narrative:     Pt is discharging home with resumption of HH services through [facility]. Information on the AVS.  Pts cousin will provide transport home.  Final next level of care: Home w Home Health Services Barriers to Discharge: No Barriers Identified   Patient Goals and CMS Choice   CMS Medicare.gov Compare Post Acute Care list provided to:: Patient Choice offered to / list presented to : Patient      Discharge Placement                       Discharge Plan and Services Additional resources added to the After Visit Summary for     Discharge Planning Services: CM Consult                      HH Arranged: PT [facility] Agency: [facility] Date [facility] Agency Contacted: 03/16/24   Representative spoke with at [facility] Agency: Loetta Ringer  Social Drivers of Health (SDOH) Interventions SDOH Screenings   Food Insecurity: No Food Insecurity (03/14/2024)  Housing: Unknown (03/14/2024)  Transportation Needs: No Transportation Needs (03/14/2024)  Utilities: Not At Risk (03/14/2024)  Financial Resource Strain: Low Risk  (06/07/2018)  Physical Activity: Unknown (06/07/2018)  Social Connections: Unknown (03/14/2024)  Stress: No Stress Concern Present (06/07/2018)  Tobacco Use: Low Risk  (03/14/2024)     Readmission Risk Interventions     No data to display

## 2024-03-16 NOTE — Plan of Care (Signed)
  Problem: Education: Goal: Knowledge of General Education information will improve Description: Including pain rating scale, medication(s)/side effects and non-pharmacologic comfort measures Outcome: Adequate for Discharge   Problem: Clinical Measurements: Goal: Diagnostic test results will improve Outcome: Adequate for Discharge Goal: Cardiovascular complication will be avoided Outcome: Adequate for Discharge

## 2024-03-17 LAB — URINE CULTURE: Culture: >=100000 — AB

## 2024-03-18 LAB — OXCARBAZEPINE (TRILEPTAL), SERUM: Oxcarbazepine Metabolite: 22 ug/mL (ref 10–35)

## 2024-03-19 NOTE — Telephone Encounter (Signed)
 FYI April, below is neurology consult note:    [name] Love is a 53 y.o. male with a past medical history of strokes, seizures, Brain cancer, Cerebral thrombosis with cerebral infarction, Cochlear implant due to sensorineural hearing loss, VP shunt placement, Ependymoma of brain, Generalized anxiety disorder, Headache, Hypothyroidism, Insomnia, Low vitamin B12 level, Memory loss, Myalgia, OSA on CPAP, Recurrent falls, Refusal of blood transfusions as patient is Jehovah's Witness, Ventricular ectopy, Sleeping difficulty, multifocal cerebral calcifications thought to be secondary to prior radiation therapy, who presents with AMS.  At baseline he appears to have some dysarthria and weakness. He is wheelchair bound appears to be relatively independent on chart review.  - On exam today, he is back to baseline - Imaging studies as above  - Impression: Overall presentation is most consistent with an acute encephalopathy, most likely secondary to the extra Neurontin  he took at home.

## 2024-03-19 NOTE — Telephone Encounter (Signed)
 Call to Ignacia Gentzler, no answer left message to return call.call to patient, states he was told he had a mass on right side of head. Advised I reviewed chart and didn't see note about results from us . He states he was in the hospital because not sure if he had a stroke or seizure. Patient is poor historian. Advised him to have Armenta Erskin call us  back.

## 2024-03-22 DIAGNOSIS — Z45321 Encounter for adjustment and management of cochlear device: Secondary | ICD-10-CM | POA: Diagnosis not present

## 2024-03-22 DIAGNOSIS — Z882 Allergy status to sulfonamides status: Secondary | ICD-10-CM | POA: Diagnosis not present

## 2024-03-22 DIAGNOSIS — Z982 Presence of cerebrospinal fluid drainage device: Secondary | ICD-10-CM | POA: Diagnosis not present

## 2024-03-22 DIAGNOSIS — Z923 Personal history of irradiation: Secondary | ICD-10-CM | POA: Diagnosis not present

## 2024-03-22 DIAGNOSIS — Z9621 Cochlear implant status: Secondary | ICD-10-CM | POA: Diagnosis not present

## 2024-03-22 DIAGNOSIS — H903 Sensorineural hearing loss, bilateral: Secondary | ICD-10-CM | POA: Diagnosis not present

## 2024-03-22 DIAGNOSIS — Z8673 Personal history of transient ischemic attack (TIA), and cerebral infarction without residual deficits: Secondary | ICD-10-CM | POA: Diagnosis not present

## 2024-03-22 DIAGNOSIS — H9193 Unspecified hearing loss, bilateral: Secondary | ICD-10-CM | POA: Diagnosis not present

## 2024-03-22 DIAGNOSIS — Z4889 Encounter for other specified surgical aftercare: Secondary | ICD-10-CM | POA: Diagnosis not present

## 2024-03-22 DIAGNOSIS — Z79899 Other long term (current) drug therapy: Secondary | ICD-10-CM | POA: Diagnosis not present

## 2024-03-22 DIAGNOSIS — Z91041 Radiographic dye allergy status: Secondary | ICD-10-CM | POA: Diagnosis not present

## 2024-03-22 DIAGNOSIS — H6123 Impacted cerumen, bilateral: Secondary | ICD-10-CM | POA: Diagnosis not present

## 2024-03-23 DIAGNOSIS — G4089 Other seizures: Secondary | ICD-10-CM | POA: Diagnosis not present

## 2024-03-23 DIAGNOSIS — G4700 Insomnia, unspecified: Secondary | ICD-10-CM | POA: Diagnosis not present

## 2024-03-23 DIAGNOSIS — Z7902 Long term (current) use of antithrombotics/antiplatelets: Secondary | ICD-10-CM | POA: Diagnosis not present

## 2024-03-23 DIAGNOSIS — F32A Depression, unspecified: Secondary | ICD-10-CM | POA: Diagnosis not present

## 2024-03-23 DIAGNOSIS — I69354 Hemiplegia and hemiparesis following cerebral infarction affecting left non-dominant side: Secondary | ICD-10-CM | POA: Diagnosis not present

## 2024-03-23 DIAGNOSIS — I509 Heart failure, unspecified: Secondary | ICD-10-CM | POA: Diagnosis not present

## 2024-03-23 DIAGNOSIS — I110 Hypertensive heart disease with heart failure: Secondary | ICD-10-CM | POA: Diagnosis not present

## 2024-03-23 DIAGNOSIS — Z556 Problems related to health literacy: Secondary | ICD-10-CM | POA: Diagnosis not present

## 2024-03-23 DIAGNOSIS — R209 Unspecified disturbances of skin sensation: Secondary | ICD-10-CM | POA: Diagnosis not present

## 2024-03-23 DIAGNOSIS — I69398 Other sequelae of cerebral infarction: Secondary | ICD-10-CM | POA: Diagnosis not present

## 2024-03-23 DIAGNOSIS — Z993 Dependence on wheelchair: Secondary | ICD-10-CM | POA: Diagnosis not present

## 2024-03-23 DIAGNOSIS — F411 Generalized anxiety disorder: Secondary | ICD-10-CM | POA: Diagnosis not present

## 2024-03-23 DIAGNOSIS — E039 Hypothyroidism, unspecified: Secondary | ICD-10-CM | POA: Diagnosis not present

## 2024-03-23 DIAGNOSIS — M1990 Unspecified osteoarthritis, unspecified site: Secondary | ICD-10-CM | POA: Diagnosis not present

## 2024-03-23 DIAGNOSIS — H903 Sensorineural hearing loss, bilateral: Secondary | ICD-10-CM | POA: Diagnosis not present

## 2024-03-23 DIAGNOSIS — R32 Unspecified urinary incontinence: Secondary | ICD-10-CM | POA: Diagnosis not present

## 2024-03-23 DIAGNOSIS — R296 Repeated falls: Secondary | ICD-10-CM | POA: Diagnosis not present

## 2024-03-23 DIAGNOSIS — Z85841 Personal history of malignant neoplasm of brain: Secondary | ICD-10-CM | POA: Diagnosis not present

## 2024-03-23 DIAGNOSIS — G4733 Obstructive sleep apnea (adult) (pediatric): Secondary | ICD-10-CM | POA: Diagnosis not present

## 2024-03-26 DIAGNOSIS — Z85841 Personal history of malignant neoplasm of brain: Secondary | ICD-10-CM | POA: Diagnosis not present

## 2024-03-26 DIAGNOSIS — R296 Repeated falls: Secondary | ICD-10-CM | POA: Diagnosis not present

## 2024-03-26 DIAGNOSIS — G4700 Insomnia, unspecified: Secondary | ICD-10-CM | POA: Diagnosis not present

## 2024-03-26 DIAGNOSIS — R209 Unspecified disturbances of skin sensation: Secondary | ICD-10-CM | POA: Diagnosis not present

## 2024-03-26 DIAGNOSIS — I509 Heart failure, unspecified: Secondary | ICD-10-CM | POA: Diagnosis not present

## 2024-03-26 DIAGNOSIS — F32A Depression, unspecified: Secondary | ICD-10-CM | POA: Diagnosis not present

## 2024-03-26 DIAGNOSIS — F411 Generalized anxiety disorder: Secondary | ICD-10-CM | POA: Diagnosis not present

## 2024-03-26 DIAGNOSIS — Z556 Problems related to health literacy: Secondary | ICD-10-CM | POA: Diagnosis not present

## 2024-03-26 DIAGNOSIS — R32 Unspecified urinary incontinence: Secondary | ICD-10-CM | POA: Diagnosis not present

## 2024-03-26 DIAGNOSIS — E039 Hypothyroidism, unspecified: Secondary | ICD-10-CM | POA: Diagnosis not present

## 2024-03-26 DIAGNOSIS — G4089 Other seizures: Secondary | ICD-10-CM | POA: Diagnosis not present

## 2024-03-26 DIAGNOSIS — Z7902 Long term (current) use of antithrombotics/antiplatelets: Secondary | ICD-10-CM | POA: Diagnosis not present

## 2024-03-26 DIAGNOSIS — Z993 Dependence on wheelchair: Secondary | ICD-10-CM | POA: Diagnosis not present

## 2024-03-26 DIAGNOSIS — I69398 Other sequelae of cerebral infarction: Secondary | ICD-10-CM | POA: Diagnosis not present

## 2024-03-26 DIAGNOSIS — I69354 Hemiplegia and hemiparesis following cerebral infarction affecting left non-dominant side: Secondary | ICD-10-CM | POA: Diagnosis not present

## 2024-03-26 DIAGNOSIS — G4733 Obstructive sleep apnea (adult) (pediatric): Secondary | ICD-10-CM | POA: Diagnosis not present

## 2024-03-26 DIAGNOSIS — I110 Hypertensive heart disease with heart failure: Secondary | ICD-10-CM | POA: Diagnosis not present

## 2024-03-26 DIAGNOSIS — M1990 Unspecified osteoarthritis, unspecified site: Secondary | ICD-10-CM | POA: Diagnosis not present

## 2024-03-26 DIAGNOSIS — H903 Sensorineural hearing loss, bilateral: Secondary | ICD-10-CM | POA: Diagnosis not present

## 2024-04-02 DIAGNOSIS — N390 Urinary tract infection, site not specified: Secondary | ICD-10-CM | POA: Diagnosis not present

## 2024-04-02 DIAGNOSIS — R319 Hematuria, unspecified: Secondary | ICD-10-CM | POA: Diagnosis not present

## 2024-04-04 DIAGNOSIS — R32 Unspecified urinary incontinence: Secondary | ICD-10-CM | POA: Diagnosis not present

## 2024-04-04 DIAGNOSIS — H903 Sensorineural hearing loss, bilateral: Secondary | ICD-10-CM | POA: Diagnosis not present

## 2024-04-04 DIAGNOSIS — G4089 Other seizures: Secondary | ICD-10-CM | POA: Diagnosis not present

## 2024-04-04 DIAGNOSIS — I110 Hypertensive heart disease with heart failure: Secondary | ICD-10-CM | POA: Diagnosis not present

## 2024-04-04 DIAGNOSIS — I69398 Other sequelae of cerebral infarction: Secondary | ICD-10-CM | POA: Diagnosis not present

## 2024-04-04 DIAGNOSIS — I69354 Hemiplegia and hemiparesis following cerebral infarction affecting left non-dominant side: Secondary | ICD-10-CM | POA: Diagnosis not present

## 2024-04-04 DIAGNOSIS — F411 Generalized anxiety disorder: Secondary | ICD-10-CM | POA: Diagnosis not present

## 2024-04-04 DIAGNOSIS — R296 Repeated falls: Secondary | ICD-10-CM | POA: Diagnosis not present

## 2024-04-04 DIAGNOSIS — R209 Unspecified disturbances of skin sensation: Secondary | ICD-10-CM | POA: Diagnosis not present

## 2024-04-04 DIAGNOSIS — Z556 Problems related to health literacy: Secondary | ICD-10-CM | POA: Diagnosis not present

## 2024-04-04 DIAGNOSIS — I509 Heart failure, unspecified: Secondary | ICD-10-CM | POA: Diagnosis not present

## 2024-04-04 DIAGNOSIS — E039 Hypothyroidism, unspecified: Secondary | ICD-10-CM | POA: Diagnosis not present

## 2024-04-04 DIAGNOSIS — M1990 Unspecified osteoarthritis, unspecified site: Secondary | ICD-10-CM | POA: Diagnosis not present

## 2024-04-04 DIAGNOSIS — Z993 Dependence on wheelchair: Secondary | ICD-10-CM | POA: Diagnosis not present

## 2024-04-04 DIAGNOSIS — Z85841 Personal history of malignant neoplasm of brain: Secondary | ICD-10-CM | POA: Diagnosis not present

## 2024-04-04 DIAGNOSIS — Z7902 Long term (current) use of antithrombotics/antiplatelets: Secondary | ICD-10-CM | POA: Diagnosis not present

## 2024-04-04 DIAGNOSIS — G4700 Insomnia, unspecified: Secondary | ICD-10-CM | POA: Diagnosis not present

## 2024-04-04 DIAGNOSIS — G4733 Obstructive sleep apnea (adult) (pediatric): Secondary | ICD-10-CM | POA: Diagnosis not present

## 2024-04-04 DIAGNOSIS — F32A Depression, unspecified: Secondary | ICD-10-CM | POA: Diagnosis not present

## 2024-04-05 ENCOUNTER — Telehealth: Payer: Self-pay | Admitting: Neurology

## 2024-04-05 NOTE — Telephone Encounter (Signed)
 Mark Bias  from Home Health called to request order for PT Home Health Physical Therapy .  Oneil explain he needs order  sent for Pysical Therapy to be 1 time a wk for 6 wks , starting on 04-09-2024    [facility] ) Phone :[phone] Fax: [phone]

## 2024-04-05 NOTE — Telephone Encounter (Signed)
 Call to [facility], PT, gave verbal orders as requested for PT

## 2024-04-09 DIAGNOSIS — G4089 Other seizures: Secondary | ICD-10-CM | POA: Diagnosis not present

## 2024-04-09 DIAGNOSIS — Z556 Problems related to health literacy: Secondary | ICD-10-CM | POA: Diagnosis not present

## 2024-04-09 DIAGNOSIS — I69398 Other sequelae of cerebral infarction: Secondary | ICD-10-CM | POA: Diagnosis not present

## 2024-04-09 DIAGNOSIS — Z993 Dependence on wheelchair: Secondary | ICD-10-CM | POA: Diagnosis not present

## 2024-04-09 DIAGNOSIS — R296 Repeated falls: Secondary | ICD-10-CM | POA: Diagnosis not present

## 2024-04-09 DIAGNOSIS — R209 Unspecified disturbances of skin sensation: Secondary | ICD-10-CM | POA: Diagnosis not present

## 2024-04-09 DIAGNOSIS — G4700 Insomnia, unspecified: Secondary | ICD-10-CM | POA: Diagnosis not present

## 2024-04-09 DIAGNOSIS — F32A Depression, unspecified: Secondary | ICD-10-CM | POA: Diagnosis not present

## 2024-04-09 DIAGNOSIS — F411 Generalized anxiety disorder: Secondary | ICD-10-CM | POA: Diagnosis not present

## 2024-04-09 DIAGNOSIS — E039 Hypothyroidism, unspecified: Secondary | ICD-10-CM | POA: Diagnosis not present

## 2024-04-09 DIAGNOSIS — Z85841 Personal history of malignant neoplasm of brain: Secondary | ICD-10-CM | POA: Diagnosis not present

## 2024-04-09 DIAGNOSIS — M1990 Unspecified osteoarthritis, unspecified site: Secondary | ICD-10-CM | POA: Diagnosis not present

## 2024-04-09 DIAGNOSIS — I110 Hypertensive heart disease with heart failure: Secondary | ICD-10-CM | POA: Diagnosis not present

## 2024-04-09 DIAGNOSIS — I69354 Hemiplegia and hemiparesis following cerebral infarction affecting left non-dominant side: Secondary | ICD-10-CM | POA: Diagnosis not present

## 2024-04-09 DIAGNOSIS — I509 Heart failure, unspecified: Secondary | ICD-10-CM | POA: Diagnosis not present

## 2024-04-09 DIAGNOSIS — Z7902 Long term (current) use of antithrombotics/antiplatelets: Secondary | ICD-10-CM | POA: Diagnosis not present

## 2024-04-09 DIAGNOSIS — H903 Sensorineural hearing loss, bilateral: Secondary | ICD-10-CM | POA: Diagnosis not present

## 2024-04-09 DIAGNOSIS — R32 Unspecified urinary incontinence: Secondary | ICD-10-CM | POA: Diagnosis not present

## 2024-04-09 DIAGNOSIS — G4733 Obstructive sleep apnea (adult) (pediatric): Secondary | ICD-10-CM | POA: Diagnosis not present

## 2024-04-12 ENCOUNTER — Other Ambulatory Visit: Payer: Self-pay | Admitting: Cardiology

## 2024-04-13 DIAGNOSIS — I509 Heart failure, unspecified: Secondary | ICD-10-CM | POA: Diagnosis not present

## 2024-04-13 DIAGNOSIS — R32 Unspecified urinary incontinence: Secondary | ICD-10-CM | POA: Diagnosis not present

## 2024-04-13 DIAGNOSIS — R209 Unspecified disturbances of skin sensation: Secondary | ICD-10-CM | POA: Diagnosis not present

## 2024-04-13 DIAGNOSIS — Z556 Problems related to health literacy: Secondary | ICD-10-CM | POA: Diagnosis not present

## 2024-04-13 DIAGNOSIS — G4733 Obstructive sleep apnea (adult) (pediatric): Secondary | ICD-10-CM | POA: Diagnosis not present

## 2024-04-13 DIAGNOSIS — Z85841 Personal history of malignant neoplasm of brain: Secondary | ICD-10-CM | POA: Diagnosis not present

## 2024-04-13 DIAGNOSIS — I110 Hypertensive heart disease with heart failure: Secondary | ICD-10-CM | POA: Diagnosis not present

## 2024-04-13 DIAGNOSIS — G4700 Insomnia, unspecified: Secondary | ICD-10-CM | POA: Diagnosis not present

## 2024-04-13 DIAGNOSIS — Z7902 Long term (current) use of antithrombotics/antiplatelets: Secondary | ICD-10-CM | POA: Diagnosis not present

## 2024-04-13 DIAGNOSIS — I69354 Hemiplegia and hemiparesis following cerebral infarction affecting left non-dominant side: Secondary | ICD-10-CM | POA: Diagnosis not present

## 2024-04-13 DIAGNOSIS — R296 Repeated falls: Secondary | ICD-10-CM | POA: Diagnosis not present

## 2024-04-13 DIAGNOSIS — F32A Depression, unspecified: Secondary | ICD-10-CM | POA: Diagnosis not present

## 2024-04-13 DIAGNOSIS — H903 Sensorineural hearing loss, bilateral: Secondary | ICD-10-CM | POA: Diagnosis not present

## 2024-04-13 DIAGNOSIS — G4089 Other seizures: Secondary | ICD-10-CM | POA: Diagnosis not present

## 2024-04-13 DIAGNOSIS — E039 Hypothyroidism, unspecified: Secondary | ICD-10-CM | POA: Diagnosis not present

## 2024-04-13 DIAGNOSIS — I69398 Other sequelae of cerebral infarction: Secondary | ICD-10-CM | POA: Diagnosis not present

## 2024-04-13 DIAGNOSIS — Z993 Dependence on wheelchair: Secondary | ICD-10-CM | POA: Diagnosis not present

## 2024-04-13 DIAGNOSIS — F411 Generalized anxiety disorder: Secondary | ICD-10-CM | POA: Diagnosis not present

## 2024-04-13 DIAGNOSIS — M1990 Unspecified osteoarthritis, unspecified site: Secondary | ICD-10-CM | POA: Diagnosis not present

## 2024-04-13 NOTE — Telephone Encounter (Signed)
 For review: are we managing med or [facility]

## 2024-04-16 NOTE — Telephone Encounter (Signed)
 Md signed pt orders faxed to [facility] at [phone] and received confirmation

## 2024-04-17 DIAGNOSIS — F411 Generalized anxiety disorder: Secondary | ICD-10-CM | POA: Diagnosis not present

## 2024-04-17 DIAGNOSIS — F4010 Social phobia, unspecified: Secondary | ICD-10-CM | POA: Diagnosis not present

## 2024-04-17 DIAGNOSIS — F39 Unspecified mood [affective] disorder: Secondary | ICD-10-CM | POA: Diagnosis not present

## 2024-04-19 DIAGNOSIS — N3281 Overactive bladder: Secondary | ICD-10-CM | POA: Diagnosis not present

## 2024-04-19 DIAGNOSIS — Z993 Dependence on wheelchair: Secondary | ICD-10-CM | POA: Diagnosis not present

## 2024-04-19 DIAGNOSIS — R209 Unspecified disturbances of skin sensation: Secondary | ICD-10-CM | POA: Diagnosis not present

## 2024-04-19 DIAGNOSIS — E7800 Pure hypercholesterolemia, unspecified: Secondary | ICD-10-CM | POA: Diagnosis not present

## 2024-04-19 DIAGNOSIS — I69352 Hemiplegia and hemiparesis following cerebral infarction affecting left dominant side: Secondary | ICD-10-CM | POA: Diagnosis not present

## 2024-04-19 DIAGNOSIS — M1990 Unspecified osteoarthritis, unspecified site: Secondary | ICD-10-CM | POA: Diagnosis not present

## 2024-04-19 DIAGNOSIS — Z125 Encounter for screening for malignant neoplasm of prostate: Secondary | ICD-10-CM | POA: Diagnosis not present

## 2024-04-19 DIAGNOSIS — F32A Depression, unspecified: Secondary | ICD-10-CM | POA: Diagnosis not present

## 2024-04-19 DIAGNOSIS — Z556 Problems related to health literacy: Secondary | ICD-10-CM | POA: Diagnosis not present

## 2024-04-19 DIAGNOSIS — R296 Repeated falls: Secondary | ICD-10-CM | POA: Diagnosis not present

## 2024-04-19 DIAGNOSIS — G40909 Epilepsy, unspecified, not intractable, without status epilepticus: Secondary | ICD-10-CM | POA: Diagnosis not present

## 2024-04-19 DIAGNOSIS — Z85841 Personal history of malignant neoplasm of brain: Secondary | ICD-10-CM | POA: Diagnosis not present

## 2024-04-19 DIAGNOSIS — Z79899 Other long term (current) drug therapy: Secondary | ICD-10-CM | POA: Diagnosis not present

## 2024-04-19 DIAGNOSIS — R32 Unspecified urinary incontinence: Secondary | ICD-10-CM | POA: Diagnosis not present

## 2024-04-19 DIAGNOSIS — F411 Generalized anxiety disorder: Secondary | ICD-10-CM | POA: Diagnosis not present

## 2024-04-19 DIAGNOSIS — E039 Hypothyroidism, unspecified: Secondary | ICD-10-CM | POA: Diagnosis not present

## 2024-04-19 DIAGNOSIS — Z7902 Long term (current) use of antithrombotics/antiplatelets: Secondary | ICD-10-CM | POA: Diagnosis not present

## 2024-04-19 DIAGNOSIS — G4089 Other seizures: Secondary | ICD-10-CM | POA: Diagnosis not present

## 2024-04-19 DIAGNOSIS — I509 Heart failure, unspecified: Secondary | ICD-10-CM | POA: Diagnosis not present

## 2024-04-19 DIAGNOSIS — I69398 Other sequelae of cerebral infarction: Secondary | ICD-10-CM | POA: Diagnosis not present

## 2024-04-19 DIAGNOSIS — I110 Hypertensive heart disease with heart failure: Secondary | ICD-10-CM | POA: Diagnosis not present

## 2024-04-19 DIAGNOSIS — I69354 Hemiplegia and hemiparesis following cerebral infarction affecting left non-dominant side: Secondary | ICD-10-CM | POA: Diagnosis not present

## 2024-04-19 DIAGNOSIS — E538 Deficiency of other specified B group vitamins: Secondary | ICD-10-CM | POA: Diagnosis not present

## 2024-04-19 DIAGNOSIS — G4733 Obstructive sleep apnea (adult) (pediatric): Secondary | ICD-10-CM | POA: Diagnosis not present

## 2024-04-19 DIAGNOSIS — H903 Sensorineural hearing loss, bilateral: Secondary | ICD-10-CM | POA: Diagnosis not present

## 2024-04-19 DIAGNOSIS — G4700 Insomnia, unspecified: Secondary | ICD-10-CM | POA: Diagnosis not present

## 2024-04-24 NOTE — Telephone Encounter (Signed)
 Md signed pt orders faxed to [facility] at [phone] and received confirmation

## 2024-04-25 ENCOUNTER — Ambulatory Visit: Admitting: Neurology

## 2024-04-26 DIAGNOSIS — I69398 Other sequelae of cerebral infarction: Secondary | ICD-10-CM | POA: Diagnosis not present

## 2024-04-26 DIAGNOSIS — I69354 Hemiplegia and hemiparesis following cerebral infarction affecting left non-dominant side: Secondary | ICD-10-CM | POA: Diagnosis not present

## 2024-04-26 DIAGNOSIS — G4700 Insomnia, unspecified: Secondary | ICD-10-CM | POA: Diagnosis not present

## 2024-04-26 DIAGNOSIS — I509 Heart failure, unspecified: Secondary | ICD-10-CM | POA: Diagnosis not present

## 2024-04-26 DIAGNOSIS — H903 Sensorineural hearing loss, bilateral: Secondary | ICD-10-CM | POA: Diagnosis not present

## 2024-04-26 DIAGNOSIS — E039 Hypothyroidism, unspecified: Secondary | ICD-10-CM | POA: Diagnosis not present

## 2024-04-26 DIAGNOSIS — R296 Repeated falls: Secondary | ICD-10-CM | POA: Diagnosis not present

## 2024-04-26 DIAGNOSIS — G4733 Obstructive sleep apnea (adult) (pediatric): Secondary | ICD-10-CM | POA: Diagnosis not present

## 2024-04-26 DIAGNOSIS — I110 Hypertensive heart disease with heart failure: Secondary | ICD-10-CM | POA: Diagnosis not present

## 2024-04-26 DIAGNOSIS — F32A Depression, unspecified: Secondary | ICD-10-CM | POA: Diagnosis not present

## 2024-04-26 DIAGNOSIS — Z7902 Long term (current) use of antithrombotics/antiplatelets: Secondary | ICD-10-CM | POA: Diagnosis not present

## 2024-04-26 DIAGNOSIS — R209 Unspecified disturbances of skin sensation: Secondary | ICD-10-CM | POA: Diagnosis not present

## 2024-04-26 DIAGNOSIS — F411 Generalized anxiety disorder: Secondary | ICD-10-CM | POA: Diagnosis not present

## 2024-04-26 DIAGNOSIS — Z1211 Encounter for screening for malignant neoplasm of colon: Secondary | ICD-10-CM | POA: Diagnosis not present

## 2024-04-26 DIAGNOSIS — G4089 Other seizures: Secondary | ICD-10-CM | POA: Diagnosis not present

## 2024-04-26 DIAGNOSIS — Z993 Dependence on wheelchair: Secondary | ICD-10-CM | POA: Diagnosis not present

## 2024-04-26 DIAGNOSIS — M1990 Unspecified osteoarthritis, unspecified site: Secondary | ICD-10-CM | POA: Diagnosis not present

## 2024-04-26 DIAGNOSIS — Z556 Problems related to health literacy: Secondary | ICD-10-CM | POA: Diagnosis not present

## 2024-04-26 DIAGNOSIS — Z1212 Encounter for screening for malignant neoplasm of rectum: Secondary | ICD-10-CM | POA: Diagnosis not present

## 2024-04-26 DIAGNOSIS — R32 Unspecified urinary incontinence: Secondary | ICD-10-CM | POA: Diagnosis not present

## 2024-04-26 DIAGNOSIS — Z85841 Personal history of malignant neoplasm of brain: Secondary | ICD-10-CM | POA: Diagnosis not present

## 2024-05-01 ENCOUNTER — Ambulatory Visit: Admitting: Neurology

## 2024-05-01 ENCOUNTER — Encounter: Payer: Self-pay | Admitting: Neurology

## 2024-05-01 VITALS — BP 125/86 | HR 84 | Ht 68.0 in

## 2024-05-01 DIAGNOSIS — I69359 Hemiplegia and hemiparesis following cerebral infarction affecting unspecified side: Secondary | ICD-10-CM

## 2024-05-01 DIAGNOSIS — I639 Cerebral infarction, unspecified: Secondary | ICD-10-CM

## 2024-05-01 DIAGNOSIS — I69398 Other sequelae of cerebral infarction: Secondary | ICD-10-CM

## 2024-05-01 DIAGNOSIS — Z982 Presence of cerebrospinal fluid drainage device: Secondary | ICD-10-CM

## 2024-05-01 DIAGNOSIS — R569 Unspecified convulsions: Secondary | ICD-10-CM | POA: Diagnosis not present

## 2024-05-01 DIAGNOSIS — C719 Malignant neoplasm of brain, unspecified: Secondary | ICD-10-CM

## 2024-05-01 DIAGNOSIS — R269 Unspecified abnormalities of gait and mobility: Secondary | ICD-10-CM

## 2024-05-01 MED ORDER — OXCARBAZEPINE 300 MG PO TABS: 450.0000 mg | ORAL_TABLET | Freq: Two times a day (BID) | ORAL | 3 refills | Status: DC

## 2024-05-01 NOTE — Progress Notes (Signed)
 ASSESSMENT AND PLAN 53 y.o. year old male   History of posterior fossa ependymoma Seizures Slow worsening gait abnormality  No recurrent seizure taking Trileptal  450 mg twice daily, check labs today  Previously tried and failed: Depakote  (weight gain), Topamax  (recurrent seizures) Ambulatory 72 hour video EEG in 2025-04-07showed intermittent left frontotemporal slowing He had a profound gait abnormality, not safe to live alone, he is under significant social strain, will refer him to Child psychotherapist, I think he will benefit long-term placement, my office will also communicate with above information with his primary care doctor [name] Love, [name] CROME, MD at [facility]  HISTORY  [name] Love, is a 54 year old male, patient of Dr. Jenel, following up for seizure, his primary care physician is Dr. Stephanie, [name] Love he drove himself to clinic today on October 25, 2022   I reviewed and summarized the referring note. PMHX. Ependymoma s/p resection, radiation therapy, in [place].   He was seen by Dr. Jenel since 04-07-2018previous was under the care of neurologist at [facility] , he used to live with his aunt, who passed away, his cousin 01/09/2024 [name] Love [phone] is his power of attorney, who live close to him, I was able to talk with 01-09-24, reported that patient complains of falling down, passing out spells, but she has not witnessed any recent spells, she also reported that [name] Love, most of the fall episode happened when he was bending over, she is suspicious there might be dehydration/malnutrition component, patient also has a lot of financial strain, rely on family to provide help,   He lives independent now, with his service dog, drove to clinic today,   He had a history of posterior fossa ependymoma, diagnosed at age 31, underwent multiple brain surgery, VP shunt placement, brain radiation   He was followed by [facility], had a repeat CT  scan regularly,   He also suffered severe bilateral hearing loss, seen by ENT Dr. Camellia Love, had a cochlear implant, in 2021   In addition, he also complains of slow worsening gait abnormality, decreased bilateral vision,   I personally reviewed CT head and MRI of the brain in May 2022 severely limited examination due to artifact from left cochlear implant, postsurgical change from previous suboccipital craniotomy, progressive T2/FLAIR signal abnormality within the right cerebellum, favor to reflect progressive posttreatment effect, right parietal VP shunt in place without hydrocephalus, underlying atrophy with chronic small vessel disease   UPDATE Nov 10 2022: He drove to clinic today, despite multiple conversations in the past, he should not drive, with his declining functional status, gait abnormality, multiple brain surgery, seizure, he lives alone, cousin was his power of attorney, he also have significant gait abnormality, high risk for fall, EEG today showed no significant abnormality   Since his medication was switched to Depakote  ER 500 mg 2 tablets at night, he complains of increased unsteadiness, dizziness sensation, he did stop Topamax   UPDATE May 01 2024: He is accompanied by his friend of 6 months [name] Love at today's visit  He was started on Depakote  in January 2024, complains of weight gain, switch to Trileptal  300 mg twice daily, no recurrent seizure  [name] Love in 2024-04-07was treated at [facility], CT head showed low-attenuation subdural collection, favor hygroma, no mass effect, CT cervical showed no acute abnormality, EEG was normal in February 2024  He had a slow worsening gait abnormality,   Hospital again in November 2024  for left-sided weakness, Possible right brain acute stroke but differential diagnosis focal seizure with Todd's paralysis. -CT head limited by artifact due to bilateral cochlear implants -CTA head and neck no LVO or significant stenosis, markedly attenuated  and irregular appearance of right SCA felt due to prior radiation treatment -MRI of the brain not performed due to predicted severe artifact with bilateral cochlear implants -2D echo EF 60 to 65% -LDL 98, started Lipitor 40 -A1c 5.4 -UDS positive for benzos (prescribed Xanax  outpatient) -EEG was normal -On Plavix  75 mg daily prior to admission, DAPT aspirin  81 and Plavix  75 for 3 weeks then Plavix  alone    In the ER 09/29/2023 for recurrent falls, CT head limited due to cochlear implant.   Hospital admission June 11-13 2025, for new onset disorientation, left facial droop, dysarthria, CT head showed no acute abnormality, right parietal ventriculostomy catheter, stable size configuration of the ventricle,   CT angiogram head and neck showed no large vessel disease, area of ischemia identified by perfusion software epic temporal lobe and cerebellum, could also be due to artifact or related to bilateral cochlear implant  Also showed vitamin B12 deficiency on supplement, normal TSH,  There was a suspicious for excessive dose of gabapentin  and urinary tract infection, he was treated with antibiotics, EEG June 11 was within normal limits  Ambulatory EEG January 04, 2024 was abnormal, intermittent left frontotemporal slowing, consistent with area of neuronal dysfunction, there was pushbutton events described as headache, slurred speech, blurry vision with no change in EEG background, hence nonepileptic.  Patient apparently suffered a lot of social issue, lives with his aging grandmother 45 years old who suffered dementia, he is very much worried about his long-term care, his cousin in April was his power of attorney, but is not much involved in his care.   PHYSICAL EXAMNIATION:    05/01/2024   11:08 AM 03/16/2024   11:54 AM 03/16/2024    7:48 AM  Vitals with BMI  Height 5' 8    Systolic 125 110 98  Diastolic 86 73 68  Pulse 84 90 80     Gen: NAD, conversant, well nourised, well groomed                      Cardiovascular: Regular rate rhythm, no peripheral edema, warm, nontender. Eyes: Conjunctivae clear without exudates or hemorrhage Neck: Supple, no carotid bruits. Pulmonary: Clear to auscultation bilaterally   NEUROLOGICAL EXAM:  MENTAL STATUS: Speech/cognition: Anxious looking middle-age male in wheelchair, mild slurred speech that is often related to decreased hearing, CRANIAL NERVES: CN II: Visual fields are full to confrontation.  Pupils are round equal and briskly reactive to light. CN III, IV, VI: extraocular movement are normal. No ptosis. CN V: Facial sensation is intact to pinprick in all 3 divisions bilaterally. Corneal responses are intact.  CN VII: Face is symmetric with normal eye closure and smile. CN VIII: Status post bilateral cochlear implant, CN IX, X: Palate elevates symmetrically. Phonation is normal. CN XI: Head turning and shoulder shrug are intact CN XII: Tongue is midline with normal movements and no atrophy.  MOTOR: There is no pronator drift of out-stretched arms. Muscle bulk and tone are normal. Muscle strength is normal.  REFLEXES: Reflexes are 1 and symmetric   SENSORY: Intact to light touch,   COORDINATION: Moderate finger to nose, heel-to-shin dysmetria,  GAIT/STANCE: Push-up to get up from seated position, moderate truncal ataxia, significant unsteady gait     REVIEW OF  SYSTEMS: Out of a complete 14 system review of symptoms, the patient complains only of the following symptoms, and all other reviewed systems are negative.  See HPI  ALLERGIES: Allergies  Allergen Reactions   Baclofen      Urinary retension   Seroquel [Quetiapine] Other (See Comments)    Weight gain   Sulfa Antibiotics Other (See Comments)    Reaction ??   Coconut Flavoring Agent (Non-Screening) Rash and Other (See Comments)    ANYTHING COCONUT- allergic to this   Contrast Media [Iodinated Contrast Media] Rash    HOME MEDICATIONS: Outpatient  Medications Prior to Visit  Medication Sig Dispense Refill   acetaminophen  (TYLENOL ) 500 MG tablet Take 2 tablets (1,000 mg total) by mouth every 8 (eight) hours.     ALPRAZolam  (XANAX ) 0.5 MG tablet Take 1 tablet (0.5 mg total) by mouth 2 (two) times daily. 60 tablet 2   atorvastatin  (LIPITOR) 40 MG tablet Take 1 tablet (40 mg total) by mouth daily.     clopidogrel  (PLAVIX ) 75 MG tablet TAKE 1 TABLET(75 MG) BY MOUTH DAILY 90 tablet 2   DULoxetine  (CYMBALTA ) 30 MG capsule Take 1 capsule (30 mg total) by mouth daily.     fluticasone  (FLONASE) 50 MCG/ACT nasal spray Place 2 sprays into both nostrils 2 (two) times daily as needed for allergies or rhinitis.     gabapentin  (NEURONTIN ) 300 MG capsule Take 1 capsule (300 mg total) by mouth 3 (three) times daily.     metoprolol  succinate (TOPROL -XL) 50 MG 24 hr tablet Take 1 tablet (50 mg total) by mouth daily. Please contact office for schedule appointment for Dec. 2025 90 tablet 1   Oxcarbazepine  (TRILEPTAL ) 300 MG tablet Take 1.5 tablets (450 mg total) by mouth 2 (two) times daily. 270 tablet 1   polyethylene glycol (MIRALAX  / GLYCOLAX ) 17 g packet Take 17 g by mouth daily as needed for moderate constipation.     No facility-administered medications prior to visit.    PAST MEDICAL HISTORY: Past Medical History:  Diagnosis Date   Abnormal gait    Acute arterial ischemic stroke, vertebrobasilar, thalamic (HCC) 06/08/2018   Acute CVA (cerebrovascular accident) (HCC) 06/07/2018   Acute CVA (cerebrovascular accident) (HCC) 06/07/2018   Allergic rhinitis    Anxiety    Arthritis 11/05/2013   Atypical chest pain 11/30/2017   Bilateral arm weakness    Bilateral hearing loss 07/28/2020   Brain cancer (HCC)    Cerebral thrombosis with cerebral infarction 04/17/2019   Cochlear implant in place with multiple channels 05/15/2020   CVA (cerebrovascular accident) (HCC) 04/18/2019   Depression    Diplopia    Dyspnea on exertion 11/30/2017   Ependymoma  of brain (HCC)    Episode of change in speech 04/17/2019   Generalized anxiety disorder 11/28/2018   Headache 11/05/2013   Hearing loss, sensorineural    Hemiparesis and alteration of sensations as late effects of stroke (HCC) 09/04/2018   History of stroke 04/01/2020   Hypothyroidism    Insomnia    Left leg weakness    Low vitamin B12 level    Memory loss 11/05/2013   Myalgia 04/18/2017   OSA on CPAP 11/28/2018   Recurrent falls    Refusal of blood transfusions as patient is Jehovah's Witness 04/01/2020   Seizure (HCC)    Seizures (HCC)    Sleeping difficulty 11/05/2013   Stroke-like symptoms    Ventricular ectopy 06/04/2020   Weakness 04/16/2019   Weakness generalized 04/17/2019    PAST SURGICAL  HISTORY: Past Surgical History:  Procedure Laterality Date   BRAIN SURGERY     HERNIA REPAIR     SHUNT REVISION      FAMILY HISTORY: Family History  Problem Relation Age of Onset   Brain cancer Mother    High blood pressure Mother     SOCIAL HISTORY: Social History   Socioeconomic History   Marital status: Single    Spouse name: Not on file   Number of children: 0   Years of education: 12   Highest education level: Not on file  Occupational History   Occupation: Disabled  Tobacco Use   Smoking status: Never   Smokeless tobacco: Never  Vaping Use   Vaping status: Never Used  Substance and Sexual Activity   Alcohol use: No   Drug use: No   Sexual activity: Yes    Birth control/protection: None  Other Topics Concern   Not on file  Social History Narrative   Lives with Aunt Sheliah Bers)   Caffeine use: No coffee   Soda- trying to quit      Right-handed   Social Drivers of Corporate investment banker Strain: Low Risk  (06/07/2018)   Overall Financial Resource Strain (CARDIA)    Difficulty of Paying Living Expenses: Not hard at all  Food Insecurity: Low Risk  (03/22/2024)   Received from [facility]   Hunger Vital Sign    Within the past 12 months,  you worried that your food would run out before you got money to buy more: Never true    Within the past 12 months, the food you bought just didn't last and you didn't have money to get more. : Never true  Transportation Needs: No Transportation Needs (03/22/2024)   Received from [facility]    In the past 12 months, has lack of reliable transportation kept you from medical appointments, meetings, work or from getting things needed for daily living? : No  Physical Activity: Unknown (06/07/2018)   Exercise Vital Sign    Days of Exercise per Week: Patient declined    Minutes of Exercise per Session: Patient declined  Stress: No Stress Concern Present (06/07/2018)   [employer] of Occupational Health - Occupational Stress Questionnaire    Feeling of Stress : Not at all  Social Connections: Unknown (03/14/2024)   Social Connection and Isolation Panel    Frequency of Communication with Friends and Family: Twice a week    Frequency of Social Gatherings with Friends and Family: Twice a week    Attends Religious Services: Never    Database administrator or Organizations: No    Attends Banker Meetings: Never    Marital Status: Not on file  Intimate Partner Violence: Not At Risk (03/14/2024)   Humiliation, Afraid, Rape, and Kick questionnaire    Fear of Current or Ex-Partner: No    Emotionally Abused: No    Physically Abused: No    Sexually Abused: No   PHYSICAL EXAM  Vitals:   05/01/24 1108  BP: 125/86  Pulse: 84  SpO2: 99%  Height: 5' 8 (1.727 m)   Body mass index is 32.18 kg/m.  Generalized: Well developed, in no acute distress  Neurological examination  Mentation: Alert oriented to time, place, history taking. Follows all commands speech and language fluent Cranial nerve II-XII: Pupils were equal round reactive to light. Extraocular movements were full, trouble with periphery vision #'s. Facial sensation and strength were normal. Head turning  and  shoulder shrug  were normal and symmetric. Motor: Mild weakness to left arm and leg, however not much effort given during exam. Noted to freely moving left arm during history Sensory: Reported decreased soft touch to left face, arm, leg Coordination: Cerebellar testing reveals good finger-nose-finger bilaterally but is hard to lift the legs, is slow Gait and station: Seated in wheelchair, slow to stand with push off, forward leaning, unsteady Reflexes: Deep tendon reflexes are symmetric and normal  DIAGNOSTIC DATA (LABS, IMAGING, TESTING) - I reviewed patient records, labs, notes, testing and imaging myself where available.  Lab Results  Component Value Date   WBC 6.5 03/16/2024   HGB 13.5 03/16/2024   HCT 41.2 03/16/2024   MCV 96.0 03/16/2024   PLT 190 03/16/2024      Component Value Date/Time   NA 138 03/16/2024 0800   NA 140 12/07/2023 0953   NA 142 11/06/2014 1751   K 4.0 03/16/2024 0800   K 3.7 11/06/2014 1751   CL 101 03/16/2024 0800   CL 106 11/06/2014 1751   CO2 26 03/16/2024 0800   CO2 29 11/06/2014 1751   GLUCOSE 102 (H) 03/16/2024 0800   GLUCOSE 91 11/06/2014 1751   BUN 13 03/16/2024 0800   BUN 12 12/07/2023 0953   BUN 14 11/06/2014 1751   CREATININE 1.21 03/16/2024 0800   CREATININE 1.11 11/06/2014 1751   CALCIUM  8.2 (L) 03/16/2024 0800   CALCIUM  8.8 11/06/2014 1751   PROT 6.3 (L) 03/14/2024 1354   PROT 6.2 12/07/2023 0953   PROT 6.9 11/06/2014 1751   ALBUMIN 3.6 03/14/2024 1354   ALBUMIN 4.3 12/07/2023 0953   ALBUMIN 3.8 11/06/2014 1751   AST 21 03/14/2024 1354   AST 33 11/06/2014 1751   ALT 23 03/14/2024 1354   ALT 55 11/06/2014 1751   ALKPHOS 112 03/14/2024 1354   ALKPHOS 89 11/06/2014 1751   BILITOT 1.1 03/14/2024 1354   BILITOT 0.3 12/07/2023 0953   BILITOT 0.4 11/06/2014 1751   GFRNONAA >60 03/16/2024 0800   GFRNONAA >60 11/06/2014 1751   GFRNONAA >60 03/12/2014 1140   GFRAA >60 04/17/2019 0810   GFRAA >60 11/06/2014 1751   GFRAA >60  03/12/2014 1140   Lab Results  Component Value Date   CHOL 162 08/19/2023   HDL 35 (L) 08/19/2023   LDLCALC 98 08/19/2023   TRIG 145 08/19/2023   CHOLHDL 4.6 08/19/2023   Lab Results  Component Value Date   HGBA1C 5.4 08/19/2023   Lab Results  Component Value Date   VITAMINB12 121 (L) 03/14/2024   Lab Results  Component Value Date   TSH 1.824 03/14/2024    Modena Callander. M.D. Ph.D.

## 2024-05-03 ENCOUNTER — Telehealth: Payer: Self-pay

## 2024-05-03 DIAGNOSIS — I509 Heart failure, unspecified: Secondary | ICD-10-CM | POA: Diagnosis not present

## 2024-05-03 DIAGNOSIS — R209 Unspecified disturbances of skin sensation: Secondary | ICD-10-CM | POA: Diagnosis not present

## 2024-05-03 DIAGNOSIS — M1990 Unspecified osteoarthritis, unspecified site: Secondary | ICD-10-CM | POA: Diagnosis not present

## 2024-05-03 DIAGNOSIS — I110 Hypertensive heart disease with heart failure: Secondary | ICD-10-CM | POA: Diagnosis not present

## 2024-05-03 DIAGNOSIS — I69354 Hemiplegia and hemiparesis following cerebral infarction affecting left non-dominant side: Secondary | ICD-10-CM | POA: Diagnosis not present

## 2024-05-03 DIAGNOSIS — E039 Hypothyroidism, unspecified: Secondary | ICD-10-CM | POA: Diagnosis not present

## 2024-05-03 DIAGNOSIS — G4733 Obstructive sleep apnea (adult) (pediatric): Secondary | ICD-10-CM | POA: Diagnosis not present

## 2024-05-03 DIAGNOSIS — F32A Depression, unspecified: Secondary | ICD-10-CM | POA: Diagnosis not present

## 2024-05-03 DIAGNOSIS — Z993 Dependence on wheelchair: Secondary | ICD-10-CM | POA: Diagnosis not present

## 2024-05-03 DIAGNOSIS — G4700 Insomnia, unspecified: Secondary | ICD-10-CM | POA: Diagnosis not present

## 2024-05-03 DIAGNOSIS — R296 Repeated falls: Secondary | ICD-10-CM | POA: Diagnosis not present

## 2024-05-03 DIAGNOSIS — H903 Sensorineural hearing loss, bilateral: Secondary | ICD-10-CM | POA: Diagnosis not present

## 2024-05-03 DIAGNOSIS — G4089 Other seizures: Secondary | ICD-10-CM | POA: Diagnosis not present

## 2024-05-03 DIAGNOSIS — I69398 Other sequelae of cerebral infarction: Secondary | ICD-10-CM | POA: Diagnosis not present

## 2024-05-03 DIAGNOSIS — R32 Unspecified urinary incontinence: Secondary | ICD-10-CM | POA: Diagnosis not present

## 2024-05-03 DIAGNOSIS — Z556 Problems related to health literacy: Secondary | ICD-10-CM | POA: Diagnosis not present

## 2024-05-03 DIAGNOSIS — F411 Generalized anxiety disorder: Secondary | ICD-10-CM | POA: Diagnosis not present

## 2024-05-03 DIAGNOSIS — Z85841 Personal history of malignant neoplasm of brain: Secondary | ICD-10-CM | POA: Diagnosis not present

## 2024-05-03 DIAGNOSIS — Z7902 Long term (current) use of antithrombotics/antiplatelets: Secondary | ICD-10-CM | POA: Diagnosis not present

## 2024-05-03 NOTE — Telephone Encounter (Signed)
 AMY RN  from Dr. Shanda office  called , returing call from nurse .Informed  She will get a call back   Callback number is  [phone]

## 2024-05-03 NOTE — Telephone Encounter (Signed)
 Call back to Amy, message said office os closed. Will try back later.

## 2024-05-03 NOTE — Telephone Encounter (Signed)
 Call to PCP office on hold for long period. Called back and left message for RN to call back when available.

## 2024-05-05 ENCOUNTER — Other Ambulatory Visit: Payer: Self-pay | Admitting: Neurology

## 2024-05-07 DIAGNOSIS — R296 Repeated falls: Secondary | ICD-10-CM | POA: Diagnosis not present

## 2024-05-07 DIAGNOSIS — M1990 Unspecified osteoarthritis, unspecified site: Secondary | ICD-10-CM | POA: Diagnosis not present

## 2024-05-07 DIAGNOSIS — G4733 Obstructive sleep apnea (adult) (pediatric): Secondary | ICD-10-CM | POA: Diagnosis not present

## 2024-05-07 DIAGNOSIS — R32 Unspecified urinary incontinence: Secondary | ICD-10-CM | POA: Diagnosis not present

## 2024-05-07 DIAGNOSIS — G4700 Insomnia, unspecified: Secondary | ICD-10-CM | POA: Diagnosis not present

## 2024-05-07 DIAGNOSIS — Z993 Dependence on wheelchair: Secondary | ICD-10-CM | POA: Diagnosis not present

## 2024-05-07 DIAGNOSIS — F32A Depression, unspecified: Secondary | ICD-10-CM | POA: Diagnosis not present

## 2024-05-07 DIAGNOSIS — Z7902 Long term (current) use of antithrombotics/antiplatelets: Secondary | ICD-10-CM | POA: Diagnosis not present

## 2024-05-07 DIAGNOSIS — Z556 Problems related to health literacy: Secondary | ICD-10-CM | POA: Diagnosis not present

## 2024-05-07 DIAGNOSIS — E039 Hypothyroidism, unspecified: Secondary | ICD-10-CM | POA: Diagnosis not present

## 2024-05-07 DIAGNOSIS — I69354 Hemiplegia and hemiparesis following cerebral infarction affecting left non-dominant side: Secondary | ICD-10-CM | POA: Diagnosis not present

## 2024-05-07 DIAGNOSIS — R209 Unspecified disturbances of skin sensation: Secondary | ICD-10-CM | POA: Diagnosis not present

## 2024-05-07 DIAGNOSIS — H903 Sensorineural hearing loss, bilateral: Secondary | ICD-10-CM | POA: Diagnosis not present

## 2024-05-07 DIAGNOSIS — G4089 Other seizures: Secondary | ICD-10-CM | POA: Diagnosis not present

## 2024-05-07 DIAGNOSIS — I110 Hypertensive heart disease with heart failure: Secondary | ICD-10-CM | POA: Diagnosis not present

## 2024-05-07 DIAGNOSIS — I69398 Other sequelae of cerebral infarction: Secondary | ICD-10-CM | POA: Diagnosis not present

## 2024-05-07 DIAGNOSIS — F411 Generalized anxiety disorder: Secondary | ICD-10-CM | POA: Diagnosis not present

## 2024-05-07 DIAGNOSIS — I509 Heart failure, unspecified: Secondary | ICD-10-CM | POA: Diagnosis not present

## 2024-05-07 DIAGNOSIS — Z85841 Personal history of malignant neoplasm of brain: Secondary | ICD-10-CM | POA: Diagnosis not present

## 2024-05-07 NOTE — Telephone Encounter (Signed)
 Call back to [name] Love    [name] Love from [facility] stated that she had a positive cologuard on him and she had reached out to get gi for colonoscopy and the pt stated that he wanted to drive. She had given verbal orders to [facility] to have a Child psychotherapist go out to assess and see what resources are available to him.

## 2024-05-22 ENCOUNTER — Ambulatory Visit: Payer: Medicare Other | Admitting: Neurology

## 2024-05-24 DIAGNOSIS — R519 Headache, unspecified: Secondary | ICD-10-CM | POA: Diagnosis not present

## 2024-05-24 DIAGNOSIS — S0990XA Unspecified injury of head, initial encounter: Secondary | ICD-10-CM | POA: Diagnosis not present

## 2024-05-24 DIAGNOSIS — R569 Unspecified convulsions: Secondary | ICD-10-CM | POA: Diagnosis not present

## 2024-05-24 DIAGNOSIS — Z79899 Other long term (current) drug therapy: Secondary | ICD-10-CM | POA: Diagnosis not present

## 2024-05-24 DIAGNOSIS — W19XXXA Unspecified fall, initial encounter: Secondary | ICD-10-CM | POA: Diagnosis not present

## 2024-05-24 DIAGNOSIS — E039 Hypothyroidism, unspecified: Secondary | ICD-10-CM | POA: Diagnosis not present

## 2024-05-24 DIAGNOSIS — G40909 Epilepsy, unspecified, not intractable, without status epilepticus: Secondary | ICD-10-CM | POA: Diagnosis not present

## 2024-05-24 DIAGNOSIS — Z8673 Personal history of transient ischemic attack (TIA), and cerebral infarction without residual deficits: Secondary | ICD-10-CM | POA: Diagnosis not present

## 2024-05-24 DIAGNOSIS — Z982 Presence of cerebrospinal fluid drainage device: Secondary | ICD-10-CM | POA: Diagnosis not present

## 2024-05-24 DIAGNOSIS — Z79890 Hormone replacement therapy (postmenopausal): Secondary | ICD-10-CM | POA: Diagnosis not present

## 2024-05-24 DIAGNOSIS — Z91040 Latex allergy status: Secondary | ICD-10-CM | POA: Diagnosis not present

## 2024-05-24 DIAGNOSIS — R531 Weakness: Secondary | ICD-10-CM | POA: Diagnosis not present

## 2024-05-24 DIAGNOSIS — R609 Edema, unspecified: Secondary | ICD-10-CM | POA: Diagnosis not present

## 2024-05-24 DIAGNOSIS — Z91013 Allergy to seafood: Secondary | ICD-10-CM | POA: Diagnosis not present

## 2024-05-24 DIAGNOSIS — Z882 Allergy status to sulfonamides status: Secondary | ICD-10-CM | POA: Diagnosis not present

## 2024-05-24 DIAGNOSIS — Z91041 Radiographic dye allergy status: Secondary | ICD-10-CM | POA: Diagnosis not present

## 2024-05-24 DIAGNOSIS — Z91018 Allergy to other foods: Secondary | ICD-10-CM | POA: Diagnosis not present

## 2024-05-24 DIAGNOSIS — W010XXA Fall on same level from slipping, tripping and stumbling without subsequent striking against object, initial encounter: Secondary | ICD-10-CM | POA: Diagnosis not present

## 2024-05-25 DIAGNOSIS — S0990XA Unspecified injury of head, initial encounter: Secondary | ICD-10-CM | POA: Diagnosis not present

## 2024-05-30 NOTE — Telephone Encounter (Signed)
 Md signed pt orders faxed to [facility] at [phone] and received confirmation

## 2024-06-13 ENCOUNTER — Emergency Department (HOSPITAL_COMMUNITY)

## 2024-06-13 ENCOUNTER — Other Ambulatory Visit: Payer: Self-pay

## 2024-06-13 ENCOUNTER — Telehealth: Payer: Self-pay | Admitting: Neurology

## 2024-06-13 ENCOUNTER — Emergency Department (HOSPITAL_COMMUNITY)
Admission: EM | Admit: 2024-06-13 | Discharge: 2024-06-13 | Disposition: A | Discharge status: Home / Self Care | Attending: Student in an Organized Health Care Education/Training Program | Admitting: Student in an Organized Health Care Education/Training Program

## 2024-06-13 DIAGNOSIS — W228XXA Striking against or struck by other objects, initial encounter: Secondary | ICD-10-CM | POA: Insufficient documentation

## 2024-06-13 DIAGNOSIS — W19XXXA Unspecified fall, initial encounter: Secondary | ICD-10-CM | POA: Diagnosis not present

## 2024-06-13 DIAGNOSIS — S0003XA Contusion of scalp, initial encounter: Secondary | ICD-10-CM | POA: Diagnosis not present

## 2024-06-13 DIAGNOSIS — S0990XA Unspecified injury of head, initial encounter: Secondary | ICD-10-CM | POA: Diagnosis not present

## 2024-06-13 DIAGNOSIS — S3991XA Unspecified injury of abdomen, initial encounter: Secondary | ICD-10-CM | POA: Insufficient documentation

## 2024-06-13 DIAGNOSIS — G9389 Other specified disorders of brain: Secondary | ICD-10-CM | POA: Diagnosis not present

## 2024-06-13 DIAGNOSIS — Z7901 Long term (current) use of anticoagulants: Secondary | ICD-10-CM | POA: Diagnosis not present

## 2024-06-13 DIAGNOSIS — S299XXA Unspecified injury of thorax, initial encounter: Secondary | ICD-10-CM | POA: Diagnosis not present

## 2024-06-13 DIAGNOSIS — S2243XA Multiple fractures of ribs, bilateral, initial encounter for closed fracture: Secondary | ICD-10-CM | POA: Diagnosis not present

## 2024-06-13 DIAGNOSIS — Z982 Presence of cerebrospinal fluid drainage device: Secondary | ICD-10-CM | POA: Diagnosis not present

## 2024-06-13 DIAGNOSIS — Z79899 Other long term (current) drug therapy: Secondary | ICD-10-CM | POA: Insufficient documentation

## 2024-06-13 DIAGNOSIS — G936 Cerebral edema: Secondary | ICD-10-CM | POA: Diagnosis not present

## 2024-06-13 DIAGNOSIS — Z043 Encounter for examination and observation following other accident: Secondary | ICD-10-CM | POA: Diagnosis not present

## 2024-06-13 LAB — CBC
HCT: 40.9 % (ref 39.0–52.0)
Hemoglobin: 13.4 g/dL (ref 13.0–17.0)
MCH: 31.8 pg (ref 26.0–34.0)
MCHC: 32.8 g/dL (ref 30.0–36.0)
MCV: 97.1 fL (ref 80.0–100.0)
Platelets: 205 K/uL (ref 150–400)
RBC: 4.21 MIL/uL — ABNORMAL LOW (ref 4.22–5.81)
RDW: 13.4 % (ref 11.5–15.5)
WBC: 6.4 K/uL (ref 4.0–10.5)
nRBC: 0 % (ref 0.0–0.2)

## 2024-06-13 LAB — I-STAT CHEM 8, ED
BUN: 11 mg/dL (ref 6–20)
Calcium, Ion: 1.08 mmol/L — ABNORMAL LOW (ref 1.15–1.40)
Chloride: 104 mmol/L (ref 98–111)
Creatinine, Ser: 1.2 mg/dL (ref 0.61–1.24)
Glucose, Bld: 111 mg/dL — ABNORMAL HIGH (ref 70–99)
HCT: 40 % (ref 39.0–52.0)
Hemoglobin: 13.6 g/dL (ref 13.0–17.0)
Potassium: 3.9 mmol/L (ref 3.5–5.1)
Sodium: 139 mmol/L (ref 135–145)
TCO2: 22 mmol/L (ref 22–32)

## 2024-06-13 LAB — URINALYSIS, ROUTINE W REFLEX MICROSCOPIC
Bilirubin Urine: NEGATIVE
Glucose, UA: NEGATIVE mg/dL
Hgb urine dipstick: NEGATIVE
Ketones, ur: NEGATIVE mg/dL
Leukocytes,Ua: NEGATIVE
Nitrite: NEGATIVE
Protein, ur: NEGATIVE mg/dL
Specific Gravity, Urine: 1.035 — ABNORMAL HIGH (ref 1.005–1.030)
pH: 5 (ref 5.0–8.0)

## 2024-06-13 LAB — COMPREHENSIVE METABOLIC PANEL WITH GFR
ALT: 29 U/L (ref 0–44)
AST: 29 U/L (ref 15–41)
Albumin: 3.8 g/dL (ref 3.5–5.0)
Alkaline Phosphatase: 97 U/L (ref 38–126)
Anion gap: 15 (ref 5–15)
BUN: 11 mg/dL (ref 6–20)
CO2: 21 mmol/L — ABNORMAL LOW (ref 22–32)
Calcium: 8.5 mg/dL — ABNORMAL LOW (ref 8.9–10.3)
Chloride: 103 mmol/L (ref 98–111)
Creatinine, Ser: 1.21 mg/dL (ref 0.61–1.24)
GFR, Estimated: >60 mL/min (ref >60)
Glucose, Bld: 112 mg/dL — ABNORMAL HIGH (ref 70–99)
Potassium: 4.1 mmol/L (ref 3.5–5.1)
Sodium: 139 mmol/L (ref 135–145)
Total Bilirubin: 0.5 mg/dL (ref 0.0–1.2)
Total Protein: 6.1 g/dL — ABNORMAL LOW (ref 6.5–8.1)

## 2024-06-13 LAB — ETHANOL: Alcohol, Ethyl (B): <15 mg/dL (ref <15)

## 2024-06-13 LAB — SAMPLE TO BLOOD BANK

## 2024-06-13 LAB — I-STAT CG4 LACTIC ACID, ED: Lactic Acid, Venous: 2.4 mmol/L (ref 0.5–1.9)

## 2024-06-13 LAB — PROTIME-INR
INR: 1 (ref 0.8–1.2)
Prothrombin Time: 14 s (ref 11.4–15.2)

## 2024-06-13 LAB — CBG MONITORING, ED: Glucose-Capillary: 111 mg/dL — ABNORMAL HIGH (ref 70–99)

## 2024-06-13 LAB — LACTIC ACID, PLASMA: Lactic Acid, Venous: 1.7 mmol/L (ref 0.5–1.9)

## 2024-06-13 MED ORDER — OXCARBAZEPINE 300 MG PO TABS
450.0000 mg | ORAL_TABLET | Freq: Once | ORAL | Status: AC
  Administered 2024-06-13: 450 mg via ORAL
  Filled 2024-06-13: qty 1

## 2024-06-13 MED ORDER — LACTATED RINGERS IV BOLUS
1000.0000 mL | Freq: Once | INTRAVENOUS | Status: AC
  Administered 2024-06-13: 1000 mL via INTRAVENOUS

## 2024-06-13 MED ORDER — IOHEXOL 350 MG/ML SOLN
75.0000 mL | Freq: Once | INTRAVENOUS | Status: AC | PRN
  Administered 2024-06-13: 75 mL via INTRAVENOUS

## 2024-06-13 NOTE — Progress Notes (Signed)
   06/13/24 1833  Spiritual Encounters  Type of Visit Initial  Care provided to: Patient  Referral source Trauma page  Reason for visit Trauma  OnCall Visit Yes  Interventions  Spiritual Care Interventions Made Established relationship of care and support;Compassionate presence;Reflective listening;Encouragement  Intervention Outcomes  Outcomes Awareness of support;Awareness of health  Advance Directives (For Healthcare)  Does Patient Have a Medical Advance Directive? No  Mental Health Advance Directives  Does Patient Have a Mental Health Advance Directive? No   Chaplain responded to Trauma 2 page, Fall on [place].  Pt is pleasant and notes this kind of thing happens often and he is ready to go home.  No support persons present.  Chaplain will continue to follow as appropriate, but no further services needed at this time.  Chaplain services remain available as the need arises by Spiritual Consult or for emergent cases, paging [phone]

## 2024-06-13 NOTE — ED Provider Notes (Signed)
  [place] EMERGENCY DEPARTMENT AT [facility] Provider Note   CSN: [id number] Arrival date & time: 06/13/24  1818     Patient presents with: No chief complaint on file.   [name] Love is a 53 y.o. male.  {Add pertinent medical, surgical, social history, OB history to HPI:32947} HPI     Prior to Admission medications   Medication Sig Start Date End Date Taking? Authorizing Provider  acetaminophen  (TYLENOL ) 500 MG tablet Take 2 tablets (1,000 mg total) by mouth every 8 (eight) hours. 08/23/23   Francella Rogue, MD  ALPRAZolam  (XANAX ) 0.5 MG tablet Take 1 tablet (0.5 mg total) by mouth 2 (two) times daily. 08/23/23   Francella Rogue, MD  atorvastatin  (LIPITOR) 40 MG tablet Take 1 tablet (40 mg total) by mouth daily. 08/24/23   Francella Rogue, MD  clopidogrel  (PLAVIX ) 75 MG tablet TAKE 1 TABLET(75 MG) BY MOUTH DAILY 11/11/23   Krasowski, Robert J, MD  DULoxetine  (CYMBALTA ) 30 MG capsule Take 1 capsule (30 mg total) by mouth daily. 08/24/23   Francella Rogue, MD  fluticasone  [facility]) 50 MCG/ACT nasal spray Place 2 sprays into both nostrils 2 (two) times daily as needed for allergies or rhinitis.    [provider]  gabapentin  (NEURONTIN ) 300 MG capsule Take 1 capsule (300 mg total) by mouth 3 (three) times daily. 08/23/23   Francella Rogue, MD  metoprolol  succinate (TOPROL -XL) 50 MG 24 hr tablet Take 1 tablet (50 mg total) by mouth daily. Please contact office for schedule appointment for Dec. 2025 04/13/24   Bernie Lamar PARAS, MD  Oxcarbazepine  (TRILEPTAL ) 300 MG tablet TAKE 1 AND 1/2 TABLETS(450 MG) BY MOUTH TWICE DAILY 05/07/24   Onita Duos, MD  polyethylene glycol (MIRALAX  / GLYCOLAX ) 17 g packet Take 17 g by mouth daily as needed for moderate constipation. 08/23/23   Francella Rogue, MD    Allergies: Baclofen , Seroquel [quetiapine], Sulfa antibiotics, Coconut flavoring agent (non-screening), and Contrast media [iodinated contrast media]    Review of Systems  Updated Vital  Signs There were no vitals taken for this visit.  Physical Exam  (all labs ordered are listed, but only abnormal results are displayed) Labs Reviewed - No data to display  EKG: None  Radiology: No results found.  {Document cardiac monitor, telemetry assessment procedure when appropriate:32947} Procedures   Medications Ordered in the ED - No data to display    {Click here for ABCD2, HEART and other calculators REFRESH Note before signing:1}                              Medical Decision Making  ***  {Document critical care time when appropriate  Document review of labs and clinical decision tools ie [id number], etc  Document your independent review of radiology images and any outside records  Document your discussion with family members, caretakers and with consultants  Document social determinants of health affecting pt's care  Document your decision making why or why not admission, treatments were needed:32947:::1}   Final diagnoses:  None    ED Discharge Orders     None

## 2024-06-13 NOTE — Telephone Encounter (Signed)
 Pt has called stating he does not agree with Dr Georgianne opinion regarding him not being recommended for driving.  Pt is asking if Lauraine, NP can override Dr Georgianne call on this request.  Pt states he has a car, and is tired of being stuck in the house, please call.
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
## 2024-06-13 NOTE — Telephone Encounter (Signed)
 Call to [facility], CMA. No answer. Left message to return call

## 2024-06-13 NOTE — ED Notes (Signed)
 Report given to chandler RN

## 2024-06-13 NOTE — Telephone Encounter (Signed)
 Call to Amy, CMA at Dr. Stephanie office, discussed last visit notes and EEG. She requested they faxed to her at [phone]. Discussed driving and handicap tag requested by patient, Dr. Onita last note stated severe gait abnormality, Abnormal EEG, and unsafe for patient to live alone.

## 2024-06-13 NOTE — Discharge Instructions (Addendum)
 You were evaluated in the emergency department today for a fall  Based on your evaluation, there is no evidence of a serious or life-threatening condition at this time.  However, we recommend that you continue closely monitoring your symptoms and arrange close follow-up with your primary care provider for reevaluation.  Your symptoms may improve on their own, but we recommend follow-up or return to the emergency department if they continue/worsen.  Follow-up: - Follow-up with your primary care provider or a specialist if your symptoms persist, worsen, or you have additional concerns.   - We typically recommend follow-up with your PCP within the next 1-3 days, unless directed otherwise by your medical provider. - If a follow-up appointment has already been scheduled, we recommend you continue to keep that appointment to discuss your recent ED visit  Return to the emergency department if you experience: - Worsening or new symptoms - New fever or fever that persists - Difficulty breathing, chest pain, severe pain, or weakness. - Signs of infection at any wound site (increased redness, warmth, discharge) - You have any other concerns or unexpected changes  Medications: - New medications prescribed by the ED: none - Pain relief: Use over-the-counter pain relievers like ibuprofen  or Tylenol  as directed on the package.  Talk to your medical provider if you have certain medical conditions like kidney disease or liver disease to make sure these medications are safe for you to use. - Continue to take all your medications as directed.  Contact your primary care physician (or seek medical care from a medical provider) if you have any side effects or questions  Other instructions: - Drink plenty of fluids to stay hydrated (we recommend water or fluids that contain electrolytes including Gatorade/Powerade/Pedialyte) - Continue to rest and care for yourself at home - Avoid strenuous activity if feeling  unwell - If applicable: consider applying ice packs or a heating pad to the affected area of injury for 20 minutes at a time, 4-5 times a day, for the next 24-48 hours.   Once again, we highly encourage you to contact your primary care provider or return to the emergency department if you have any further concerns or questions.

## 2024-06-13 NOTE — ED Notes (Signed)
Pt remains in CT at this time.  

## 2024-06-13 NOTE — Telephone Encounter (Signed)
 Ann callback  , Inform Nurse will call back

## 2024-06-13 NOTE — Telephone Encounter (Signed)
 Ann from [facility]  called to request to speak to Nurse asap  the patient came into Dr. Stephanie office to give an Application for new license  Plate . An would like to speak to nurse  because Jenkins stated she thought MD/NP  told Pt he is not able to drive .   Ann stated if call back inform Phone staff that you were told to get through to [facility] even if you have to LVM   Ann callback # [phone] ext [phone]

## 2024-06-13 NOTE — ED Notes (Signed)
 CCMD called to place the patient on cardiac monitoring services.

## 2024-06-13 NOTE — ED Triage Notes (Signed)
 Pt with hx of contrast, pt reports allergy is to MRI contrast only.  Per chart pt has had several CT's with contrast without reaction. Spoke with Dr. Corinthia EDP, ok to proceed with contrast study at his time.  Pt able remove cochlear implants prior to scan.

## 2024-06-13 NOTE — ED Notes (Signed)
 Trauma RN arrived to room at this time

## 2024-06-13 NOTE — ED Notes (Signed)
 Patient transported to CT

## 2024-06-14 NOTE — Telephone Encounter (Signed)
 Patient returned call, explained Lauraine works under Dr. Onita and upholds Dr. Onita recommendations. Patient verbalized understanding.

## 2024-06-14 NOTE — Telephone Encounter (Signed)
 Call to patient to review Dr. Onita recommendations, no answer, left message to return call.

## 2024-06-14 NOTE — Telephone Encounter (Signed)
 Pt has returned call to RN

## 2024-06-20 ENCOUNTER — Ambulatory Visit (INDEPENDENT_AMBULATORY_CARE_PROVIDER_SITE_OTHER): Admitting: Urology

## 2024-06-20 VITALS — BP 121/80 | HR 79 | Ht 68.0 in

## 2024-06-20 DIAGNOSIS — N3281 Overactive bladder: Secondary | ICD-10-CM | POA: Diagnosis not present

## 2024-06-20 LAB — BLADDER SCAN AMB NON-IMAGING

## 2024-06-20 MED ORDER — GEMTESA 75 MG PO TABS: 75.0000 mg | ORAL_TABLET | Freq: Every day | ORAL | Status: AC

## 2024-06-20 NOTE — Progress Notes (Signed)
   06/20/2024 1:20 PM   [name] Love 1971-07-21 [id number]  Reason for visit: Overactive bladder, urinary incontinence  History: Very complex history including posterior fossa ependyoma, seizures, stroke, VP shunt, history of brain radiation and multiple brain surgeries Long history of overactive bladder symptoms with urgency, frequency, incontinence Previously was trialed on Myrbetriq 2021, he does not think that was helpful  Physical Exam: BP 121/80 (BP Location: Left Arm, Patient Position: Sitting, Cuff Size: Normal)   Pulse 79   Ht 5' 8 (1.727 m)   SpO2 95%   BMI 32.18 kg/m  In wheelchair  Imaging/labs: Urinalysis normal August and September 2025 PSA normal at 1.04 June 2020 Recent CT September 2025 with no urologic abnormalities, decompressed bladder, no hydronephrosis  Today: PVR normal today at 0ml Continues to report bothersome urgency, frequency, incontinence.  Denies any dysuria or gross hematuria.  History challenging to obtain. During recent hospitalization had a pure wick in place which he found very helpful  Plan:   OAB/incontinence: Need to have realistic expectations with his extensive prior neurologic history.  Reasonable to trial Gemtesa  and samples were given today.  Information about pure wick provided, unfortunately have not found insurance coverage to be very helpful.  Will also set up for condom catheter fitting if unable to obtain PureWick.   [name] JAYSON Burnet, MD  [facility] [address], [address] [place], [place] [address] [phone]

## 2024-06-20 NOTE — Patient Instructions (Signed)
 Visit [email or web address] for more information about obtaining a pure wick.  These can be ordered online without a prescription, unfortunately these are not typically covered by insurance, but you can reach out to your insurance company to see if they are able to help at all with coverage

## 2024-07-03 DIAGNOSIS — X101XXA Contact with hot food, initial encounter: Secondary | ICD-10-CM | POA: Diagnosis not present

## 2024-07-03 DIAGNOSIS — I10 Essential (primary) hypertension: Secondary | ICD-10-CM | POA: Diagnosis not present

## 2024-07-03 DIAGNOSIS — F329 Major depressive disorder, single episode, unspecified: Secondary | ICD-10-CM | POA: Diagnosis not present

## 2024-07-03 DIAGNOSIS — T280XXA Burn of mouth and pharynx, initial encounter: Secondary | ICD-10-CM | POA: Diagnosis not present

## 2024-07-10 DIAGNOSIS — F4010 Social phobia, unspecified: Secondary | ICD-10-CM | POA: Diagnosis not present

## 2024-07-10 DIAGNOSIS — F39 Unspecified mood [affective] disorder: Secondary | ICD-10-CM | POA: Diagnosis not present

## 2024-07-10 DIAGNOSIS — F411 Generalized anxiety disorder: Secondary | ICD-10-CM | POA: Diagnosis not present

## 2024-07-10 DIAGNOSIS — E039 Hypothyroidism, unspecified: Secondary | ICD-10-CM | POA: Diagnosis not present

## 2024-07-31 NOTE — Progress Notes (Unsigned)
 08/01/2024 3:46 PM   [name] Love 1971-04-02 [id number]  Referring provider: Stephanie Charlene CROME, MD [address] [place],  [place] [address]  Urological history: 1. Urge incontinence - Failed Myrbetriq 50 mg   Chief Complaint  Patient presents with   Over Active Bladder   HPI: [name] Love is a 53 y.o. man with a complex neurological history having symptoms of urge incontinence who presents today for follow-up after a trial of Gemtesa  and to discuss condom catheters.  Previous records reviewed.  He took 2 weeks of the Gemtesa  75 mg daily, but he had to discontinue it as a family member felt that his speech was getting more slurred.  When they stopped the Gemtesa  samples, his voice returned to normal.  He states that while he was on the 2 weeks of Gemtesa , he did not notice any changes in his urinary incontinence.  Patient denies any modifying or aggravating factors.  Patient denies any recent UTI's, gross hematuria, dysuria or suprapubic/flank pain.  Patient denies any fevers, chills, nausea or vomiting.    PVR 0 mL   Serum creatinine of 1.20 in September.  PMH: Past Medical History:  Diagnosis Date   Abnormal gait    Acute arterial ischemic stroke, vertebrobasilar, thalamic (HCC) 06/08/2018   Acute CVA (cerebrovascular accident) (HCC) 06/07/2018   Acute CVA (cerebrovascular accident) (HCC) 06/07/2018   Allergic rhinitis    Anxiety    Arthritis 11/05/2013   Atypical chest pain 11/30/2017   Bilateral arm weakness    Bilateral hearing loss 07/28/2020   Brain cancer (HCC)    Cerebral thrombosis with cerebral infarction 04/17/2019   Cochlear implant in place with multiple channels 05/15/2020   CVA (cerebrovascular accident) (HCC) 04/18/2019   Depression    Diplopia    Dyspnea on exertion 11/30/2017   Ependymoma of brain (HCC)    Episode of change in speech 04/17/2019   Generalized anxiety disorder 11/28/2018   Headache 11/05/2013   Hearing loss, sensorineural     Hemiparesis and alteration of sensations as late effects of stroke (HCC) 09/04/2018   History of stroke 04/01/2020   Hypothyroidism    Insomnia    Left leg weakness    Low vitamin B12 level    Memory loss 11/05/2013   Myalgia 04/18/2017   OSA on CPAP 11/28/2018   Recurrent falls    Refusal of blood transfusions as patient is Jehovah's Witness 04/01/2020   Seizure (HCC)    Seizures (HCC)    Sleeping difficulty 11/05/2013   Stroke-like symptoms    Ventricular ectopy 06/04/2020   Weakness 04/16/2019   Weakness generalized 04/17/2019    Surgical History: Past Surgical History:  Procedure Laterality Date   BRAIN SURGERY     HERNIA REPAIR     SHUNT REVISION      Home Medications:  Allergies as of 08/01/2024       Reactions   Gemtesa  [vibegron ] Other (See Comments)   Slurred speech   Baclofen     Urinary retension   Seroquel [quetiapine] Other (See Comments)   Weight gain   Sulfa Antibiotics Other (See Comments)   Reaction ??   Sulfacetamide Sodium    Coconut Flavoring Agent (non-screening) Rash, Other (See Comments)   ANYTHING COCONUT- allergic to this   Contrast Media [iodinated Contrast Media] Rash        Medication List        Accurate as of August 01, 2024  3:46 PM. If you have any questions, ask your  nurse or doctor.          acetaminophen  500 MG tablet Commonly known as: TYLENOL  Take 2 tablets (1,000 mg total) by mouth every 8 (eight) hours.   ALPRAZolam  0.5 MG tablet Commonly known as: XANAX  Take 1 tablet (0.5 mg total) by mouth 2 (two) times daily.   atorvastatin  40 MG tablet Commonly known as: LIPITOR Take 1 tablet (40 mg total) by mouth daily.   clopidogrel  75 MG tablet Commonly known as: PLAVIX  TAKE 1 TABLET(75 MG) BY MOUTH DAILY   DULoxetine  60 MG capsule Commonly known as: CYMBALTA  Take 60 mg by mouth daily.   fluticasone  50 MCG/ACT nasal spray Commonly known as: FLONASE Place 2 sprays into both nostrils 2 (two) times daily as  needed for allergies or rhinitis.   Gemtesa  75 MG Tabs Generic drug: Vibegron  Take 1 tablet (75 mg total) by mouth daily.   levothyroxine  50 MCG tablet Commonly known as: SYNTHROID  Take 50 mcg by mouth daily.   metoprolol  succinate 50 MG 24 hr tablet Commonly known as: TOPROL -XL Take 1 tablet (50 mg total) by mouth daily. Please contact office for schedule appointment for Dec. 2025   Oxcarbazepine  300 MG tablet Commonly known as: TRILEPTAL  TAKE 1 AND 1/2 TABLETS(450 MG) BY MOUTH TWICE DAILY        Allergies:  Allergies  Allergen Reactions   Gemtesa  [Vibegron ] Other (See Comments)    Slurred speech   Baclofen      Urinary retension   Seroquel [Quetiapine] Other (See Comments)    Weight gain   Sulfa Antibiotics Other (See Comments)    Reaction ??   Sulfacetamide Sodium    Coconut Flavoring Agent (Non-Screening) Rash and Other (See Comments)    ANYTHING COCONUT- allergic to this   Contrast Media [Iodinated Contrast Media] Rash    Family History: Family History  Problem Relation Age of Onset   Brain cancer Mother    High blood pressure Mother     Social History:  reports that he has never smoked. He has never used smokeless tobacco. He reports that he does not drink alcohol and does not use drugs.  ROS: Pertinent ROS in HPI  Physical Exam: BP 120/78   Pulse 92   Ht 5' 8 (1.727 m)   Wt 185 lb (83.9 kg)   BMI 28.13 kg/m   Constitutional:  Well nourished. Alert and oriented, No acute distress. HEENT: [place] AT, moist mucus membranes.  Trachea midline Cardiovascular: No clubbing, cyanosis, or edema. Respiratory: Normal respiratory effort, no increased work of breathing. Neurologic: Grossly intact, no focal deficits, moving all 4 extremities.  In wheelchair  Psychiatric: Normal mood and affect.  Laboratory Data: See Epic and HPI   I have reviewed the labs.   Pertinent Imaging:  08/01/24 13:52  Scan Result 0ml    Assessment & Plan:    1. Urge  incontinence - failed Myrbetriq - failed Gemtesa   - Demonstrated how to use condom catheters and gave him samples, he will call back with the size that he feels fits the best - We will also work on getting a PureWick system covered by his insurance as he had that at the hospital and while he was in SNF and it worked very well for him  Return for pending PureWick PA .  These notes generated with voice recognition software. I apologize for typographical errors.  CLOTILDA HELON RIGGERS  [facility] [address]  [address] [place], [place] [address] [phone]  Clarita Shed [phone], case manager

## 2024-08-01 ENCOUNTER — Encounter: Payer: Self-pay | Admitting: Urology

## 2024-08-01 ENCOUNTER — Ambulatory Visit: Admitting: Urology

## 2024-08-01 VITALS — BP 120/78 | HR 92 | Ht 68.0 in | Wt 185.0 lb

## 2024-08-01 DIAGNOSIS — N3941 Urge incontinence: Secondary | ICD-10-CM

## 2024-08-01 LAB — BLADDER SCAN AMB NON-IMAGING

## 2024-08-02 ENCOUNTER — Telehealth: Payer: Self-pay | Admitting: Urology

## 2024-08-02 NOTE — Telephone Encounter (Signed)
 [name] Love from [place] called in reference to patient. She stated she was calling to provide information on how to submit a request. Care Management Department will need to be contacted at [phone] and you will need to press prompt 6. She also provided fax number, which is, [phone]. If you have any further questions, she may be reached at [phone].

## 2024-08-06 NOTE — Telephone Encounter (Signed)
 Called Clarita Shed and it went straight to voice mail

## 2024-08-07 NOTE — Telephone Encounter (Signed)
 Called janet simmons and it went to straight to voice mail

## 2024-08-10 DIAGNOSIS — W19XXXA Unspecified fall, initial encounter: Secondary | ICD-10-CM | POA: Diagnosis not present

## 2024-08-10 DIAGNOSIS — W1830XA Fall on same level, unspecified, initial encounter: Secondary | ICD-10-CM | POA: Diagnosis not present

## 2024-08-10 DIAGNOSIS — Z982 Presence of cerebrospinal fluid drainage device: Secondary | ICD-10-CM | POA: Diagnosis not present

## 2024-08-10 DIAGNOSIS — S00212A Abrasion of left eyelid and periocular area, initial encounter: Secondary | ICD-10-CM | POA: Diagnosis not present

## 2024-08-10 DIAGNOSIS — R42 Dizziness and giddiness: Secondary | ICD-10-CM | POA: Diagnosis not present

## 2024-08-10 DIAGNOSIS — R55 Syncope and collapse: Secondary | ICD-10-CM | POA: Diagnosis not present

## 2024-08-10 DIAGNOSIS — Z8589 Personal history of malignant neoplasm of other organs and systems: Secondary | ICD-10-CM | POA: Diagnosis not present

## 2024-08-10 DIAGNOSIS — G40909 Epilepsy, unspecified, not intractable, without status epilepticus: Secondary | ICD-10-CM | POA: Diagnosis not present

## 2024-08-10 DIAGNOSIS — S0990XA Unspecified injury of head, initial encounter: Secondary | ICD-10-CM | POA: Diagnosis not present

## 2024-08-10 DIAGNOSIS — Z923 Personal history of irradiation: Secondary | ICD-10-CM | POA: Diagnosis not present

## 2024-08-21 DIAGNOSIS — K000 Anodontia: Secondary | ICD-10-CM | POA: Diagnosis not present

## 2024-08-24 DIAGNOSIS — H9222 Otorrhagia, left ear: Secondary | ICD-10-CM | POA: Diagnosis not present

## 2024-09-12 ENCOUNTER — Telehealth: Payer: Self-pay | Admitting: Urology

## 2024-09-12 NOTE — Telephone Encounter (Signed)
 Patient called to check status on Pure wick system that was discussed at his last appointment with [facility]. Please call patient to advise.

## 2024-09-12 NOTE — Telephone Encounter (Signed)
Called patient and patient understood

## 2024-09-17 NOTE — Telephone Encounter (Signed)
 Patient called again to check status on Pure wick system.

## 2024-09-19 DIAGNOSIS — F39 Unspecified mood [affective] disorder: Secondary | ICD-10-CM | POA: Diagnosis not present

## 2024-09-19 DIAGNOSIS — F411 Generalized anxiety disorder: Secondary | ICD-10-CM | POA: Diagnosis not present

## 2024-09-19 DIAGNOSIS — F4010 Social phobia, unspecified: Secondary | ICD-10-CM | POA: Diagnosis not present

## 2024-09-19 NOTE — Telephone Encounter (Signed)
 Clarita with [employer] called regarding approval for pts purwick sys.   She advised that we call her back at [phone] - N/a     [facility]   She also advised that we could call [phone] press # [phone] to start PA process.   Contacted [employer] for pa process-   CS# [id number]  NPI for [employer]- [id number]  [facility]- [id number]  Clinicals faxed to [facility] via [employer]- to [phone] conf- 12/17 1456p    LM with pt informing him PA process for purwick has been initiated.  May take up to 7-10 bus days for a response.  Will send this to Ula and Clotilda to ensure f/u.

## 2024-09-24 ENCOUNTER — Telehealth: Payer: Self-pay

## 2024-09-24 NOTE — Telephone Encounter (Signed)
 Delon with [employer] states the purewick has been denied. She states bc regular medicare does not cover they will not either.   She will be faxing over documentation to the clinic. She will mail a letter to the pt as well as contact him.   Will send to [facility] so she is aware.

## 2024-09-25 NOTE — Telephone Encounter (Signed)
 LM informing pt Purwick was denied.

## 2024-10-09 ENCOUNTER — Other Ambulatory Visit: Payer: Self-pay

## 2024-10-09 MED ORDER — CLOPIDOGREL BISULFATE 75 MG PO TABS: ORAL_TABLET | ORAL | 0 refills | Status: DC

## 2024-10-11 ENCOUNTER — Other Ambulatory Visit: Payer: Self-pay

## 2024-10-11 MED ORDER — CLOPIDOGREL BISULFATE 75 MG PO TABS: ORAL_TABLET | ORAL | 0 refills | Status: AC

## 2024-10-11 MED ORDER — METOPROLOL SUCCINATE ER 50 MG PO TB24: 50.0000 mg | ORAL_TABLET | Freq: Every day | ORAL | 0 refills | Status: AC

## 2024-10-11 NOTE — Telephone Encounter (Signed)
 Pharmacy changed to [facility] instead of [facility] in [place]

## 2025-01-29 ENCOUNTER — Ambulatory Visit: Admitting: Adult Health
